# Patient Record
Sex: Male | Born: 1950 | Race: White | Hispanic: No | Smoking: Former smoker
Health system: Southern US, Community
[De-identification: ages and names within clinical notes are randomized; demographics above are authoritative.]

## PROBLEM LIST (undated history)

## (undated) DIAGNOSIS — E871 Hypo-osmolality and hyponatremia: Secondary | ICD-10-CM

## (undated) DIAGNOSIS — I10 Essential (primary) hypertension: Secondary | ICD-10-CM

## (undated) DIAGNOSIS — IMO0002 Reserved for concepts with insufficient information to code with codable children: Secondary | ICD-10-CM

## (undated) DIAGNOSIS — F1020 Alcohol dependence, uncomplicated: Secondary | ICD-10-CM

## (undated) DIAGNOSIS — IMO0001 Reserved for inherently not codable concepts without codable children: Secondary | ICD-10-CM

## (undated) DIAGNOSIS — I739 Peripheral vascular disease, unspecified: Secondary | ICD-10-CM

## (undated) DIAGNOSIS — E43 Unspecified severe protein-calorie malnutrition: Secondary | ICD-10-CM

## (undated) DIAGNOSIS — I2510 Atherosclerotic heart disease of native coronary artery without angina pectoris: Secondary | ICD-10-CM

## (undated) DIAGNOSIS — R011 Cardiac murmur, unspecified: Secondary | ICD-10-CM

## (undated) DIAGNOSIS — N492 Inflammatory disorders of scrotum: Secondary | ICD-10-CM

## (undated) DIAGNOSIS — C61 Malignant neoplasm of prostate: Secondary | ICD-10-CM

## (undated) DIAGNOSIS — L120 Bullous pemphigoid: Secondary | ICD-10-CM

## (undated) DIAGNOSIS — N179 Acute kidney failure, unspecified: Secondary | ICD-10-CM

## (undated) DIAGNOSIS — D696 Thrombocytopenia, unspecified: Secondary | ICD-10-CM

## (undated) HISTORY — DX: Thrombocytopenia, unspecified: D69.6

## (undated) HISTORY — DX: Hypo-osmolality and hyponatremia: E87.1

## (undated) HISTORY — DX: Peripheral vascular disease, unspecified: I73.9

## (undated) HISTORY — DX: Alcohol dependence, uncomplicated: F10.20

## (undated) HISTORY — PX: HEMORRHOID SURGERY: SHX153

## (undated) HISTORY — PX: WRIST SURGERY: SHX841

## (undated) HISTORY — DX: Bullous pemphigoid: L12.0

## (undated) HISTORY — DX: Acute kidney failure, unspecified: N17.9

## (undated) HISTORY — DX: Unspecified severe protein-calorie malnutrition: E43

## (undated) HISTORY — DX: Malignant neoplasm of prostate: C61

## (undated) HISTORY — DX: Reserved for inherently not codable concepts without codable children: IMO0001

## (undated) HISTORY — DX: Inflammatory disorders of scrotum: N49.2

## (undated) HISTORY — DX: Atherosclerotic heart disease of native coronary artery without angina pectoris: I25.10

## (undated) HISTORY — DX: Reserved for concepts with insufficient information to code with codable children: IMO0002

## (undated) HISTORY — PX: OTHER SURGICAL HISTORY: SHX169

## (undated) HISTORY — DX: Cardiac murmur, unspecified: R01.1

---

## 2001-02-06 ENCOUNTER — Emergency Department (HOSPITAL_COMMUNITY): Admission: EM | Admit: 2001-02-06 | Discharge: 2001-02-06 | Payer: Self-pay | Admitting: Emergency Medicine

## 2001-02-06 ENCOUNTER — Encounter: Payer: Self-pay | Admitting: Emergency Medicine

## 2009-05-03 ENCOUNTER — Encounter (INDEPENDENT_AMBULATORY_CARE_PROVIDER_SITE_OTHER): Payer: Self-pay | Admitting: Urology

## 2009-05-03 ENCOUNTER — Inpatient Hospital Stay (HOSPITAL_COMMUNITY): Admission: RE | Admit: 2009-05-03 | Discharge: 2009-05-04 | Payer: Self-pay | Admitting: Urology

## 2009-05-03 DIAGNOSIS — C61 Malignant neoplasm of prostate: Secondary | ICD-10-CM | POA: Insufficient documentation

## 2009-05-03 HISTORY — DX: Malignant neoplasm of prostate: C61

## 2009-05-03 HISTORY — PX: ROBOT ASSISTED LAPAROSCOPIC RADICAL PROSTATECTOMY: SHX5141

## 2010-09-04 LAB — BASIC METABOLIC PANEL
BUN: 4 mg/dL — ABNORMAL LOW (ref 6–23)
BUN: 4 mg/dL — ABNORMAL LOW (ref 6–23)
CO2: 30 mEq/L (ref 19–32)
Calcium: 8.1 mg/dL — ABNORMAL LOW (ref 8.4–10.5)
Calcium: 8.7 mg/dL (ref 8.4–10.5)
Chloride: 99 mEq/L (ref 96–112)
Creatinine, Ser: 0.73 mg/dL (ref 0.4–1.5)
Creatinine, Ser: 0.77 mg/dL (ref 0.4–1.5)
GFR calc Af Amer: >60 mL/min (ref >60)
GFR calc non Af Amer: >60 mL/min (ref >60)
GFR calc non Af Amer: >60 mL/min (ref >60)
Glucose, Bld: 102 mg/dL — ABNORMAL HIGH (ref 70–99)
Glucose, Bld: 136 mg/dL — ABNORMAL HIGH (ref 70–99)
Potassium: 4.7 mEq/L (ref 3.5–5.1)
Sodium: 132 mEq/L — ABNORMAL LOW (ref 135–145)

## 2010-09-04 LAB — CBC
Hemoglobin: 13.2 g/dL (ref 13.0–17.0)
MCHC: 34.4 g/dL (ref 30.0–36.0)
MCV: 97.4 fL (ref 78.0–100.0)
Platelets: 245 10*3/uL (ref 150–400)
RBC: 3.94 MIL/uL — ABNORMAL LOW (ref 4.22–5.81)
RDW: 12.6 % (ref 11.5–15.5)

## 2010-09-04 LAB — DIFFERENTIAL
Basophils Absolute: 0 10*3/uL (ref 0.0–0.1)
Basophils Relative: 0 % (ref 0–1)
Eosinophils Absolute: 0 10*3/uL (ref 0.0–0.7)
Eosinophils Relative: 1 % (ref 0–5)
Lymphocytes Relative: 17 % (ref 12–46)
Monocytes Absolute: 0.2 10*3/uL (ref 0.1–1.0)
Monocytes Relative: 2 % — ABNORMAL LOW (ref 3–12)
Neutro Abs: 5.2 10*3/uL (ref 1.7–7.7)
Neutrophils Relative %: 91 % — ABNORMAL HIGH (ref 43–77)

## 2010-09-04 LAB — POTASSIUM: Potassium: 5.4 mEq/L — ABNORMAL HIGH (ref 3.5–5.1)

## 2010-09-05 LAB — CBC
MCV: 98.2 fL (ref 78.0–100.0)
Platelets: 322 10*3/uL (ref 150–400)
WBC: 6.4 10*3/uL (ref 4.0–10.5)

## 2010-09-05 LAB — COMPREHENSIVE METABOLIC PANEL
AST: 26 U/L (ref 0–37)
Albumin: 4.4 g/dL (ref 3.5–5.2)
Chloride: 97 mEq/L (ref 96–112)
Creatinine, Ser: 0.92 mg/dL (ref 0.4–1.5)
GFR calc Af Amer: >60 mL/min (ref >60)
Total Bilirubin: 0.7 mg/dL (ref 0.3–1.2)
Total Protein: 7.4 g/dL (ref 6.0–8.3)

## 2010-09-05 LAB — TYPE AND SCREEN: ABO/RH(D): A POS

## 2011-06-07 ENCOUNTER — Encounter: Payer: Self-pay | Admitting: *Deleted

## 2011-06-07 NOTE — Progress Notes (Signed)
Psa 05/09/11= 0.12 Psa 01/31/11=0.07 Psa 09/25/10=0.03 Psa 05/24/10=0.01  Path:05/03/2009: Prostate GNF:[id number],EXBMWUX=3(2+4) involving both lobes,(9/12) cores positive ,Radical Prostatectomy Dx 03/09/09, Psa=4.4,Volume=15cc Psa /2011=<0.04   Married,Meter Reader, no children  Allergies:Nkda

## 2011-06-10 ENCOUNTER — Ambulatory Visit
Admission: RE | Admit: 2011-06-10 | Discharge: 2011-06-10 | Disposition: A | Discharge status: Home / Self Care | Payer: BC Managed Care – PPO | Source: Ambulatory Visit | Attending: Radiation Oncology | Admitting: Radiation Oncology

## 2011-06-10 ENCOUNTER — Encounter: Payer: Self-pay | Admitting: Radiation Oncology

## 2011-06-10 VITALS — BP 159/90 | HR 105 | Temp 97.8°F | Resp 20 | Ht 67.0 in | Wt 131.4 lb

## 2011-06-10 DIAGNOSIS — F17200 Nicotine dependence, unspecified, uncomplicated: Secondary | ICD-10-CM | POA: Insufficient documentation

## 2011-06-10 DIAGNOSIS — C61 Malignant neoplasm of prostate: Secondary | ICD-10-CM

## 2011-06-10 DIAGNOSIS — Z9079 Acquired absence of other genital organ(s): Secondary | ICD-10-CM | POA: Insufficient documentation

## 2011-06-10 DIAGNOSIS — Z510 Encounter for antineoplastic radiation therapy: Secondary | ICD-10-CM | POA: Insufficient documentation

## 2011-06-10 DIAGNOSIS — Z8049 Family history of malignant neoplasm of other genital organs: Secondary | ICD-10-CM | POA: Insufficient documentation

## 2011-06-10 DIAGNOSIS — R32 Unspecified urinary incontinence: Secondary | ICD-10-CM | POA: Insufficient documentation

## 2011-06-10 DIAGNOSIS — Z803 Family history of malignant neoplasm of breast: Secondary | ICD-10-CM | POA: Insufficient documentation

## 2011-06-10 DIAGNOSIS — K117 Disturbances of salivary secretion: Secondary | ICD-10-CM | POA: Insufficient documentation

## 2011-06-10 DIAGNOSIS — N390 Urinary tract infection, site not specified: Secondary | ICD-10-CM | POA: Insufficient documentation

## 2011-06-10 DIAGNOSIS — R3915 Urgency of urination: Secondary | ICD-10-CM | POA: Insufficient documentation

## 2011-06-10 DIAGNOSIS — R110 Nausea: Secondary | ICD-10-CM | POA: Insufficient documentation

## 2011-06-10 DIAGNOSIS — T50995A Adverse effect of other drugs, medicaments and biological substances, initial encounter: Secondary | ICD-10-CM | POA: Insufficient documentation

## 2011-06-10 NOTE — Progress Notes (Signed)
Pt denys any dysuria, hesitancy, gets up at night 1-2x to void, does have urgency though

## 2011-06-10 NOTE — Progress Notes (Signed)
Radiation Oncology         [phone]) [phone] ________________________________  Initial outpatient Consultation  Name: Rodney [name] MRN: [id number]  Date: 06/10/2011  DOB: 09-14-1950  CC:  Valetta Fuller, MD   REFERRING PHYSICIAN: Valetta Fuller, MD  DIAGNOSIS: 61 year old gentleman with a detectable PSA of 0.12 following radical prostatectomy for stage T2c disease with Gleason's 3+4  HISTORY OF PRESENT ILLNESS::Rodney [name] is a 62 y.o. man who was noted at age 7 to have a slightly elevated PSA of 4.4. This prompted a transrectal ultrasound with prostate biopsies on 03/19/2009. At that time, 9/12 core biopsies were positive for adenocarcinoma with a maximum Gleason score of 3+4. Rodney [name] elected to proceed with robotic-assisted laparoscopic radical prostatectomy on 05/04/1999 and. Rodney surgical specimen contained adenocarcinoma with a Gleason score of 3+4 involving both lobes. Rodney surgical margins were negative. 6 right-sided lymph nodes and 4 left-sided lymph nodes were free of metastatic involvement. Rodney specimen was notable for no extracapsular extension and Rodney seminal vesicles were free of involvement. Rodney pathology report did described apical involvement. Postoperatively, Mr. alleys PSAs have remained undetectable until August 30 when Rodney PSA was slightly higher at 0.07. Followup PSA on 05/09/2011 was higher at 0.12. Rodney [name] has kindly referred today for discussion of possible salvage radiotherapy in Rodney setting of a possible rising PSA.  PREVIOUS RADIATION THERAPY: No  PAST MEDICAL HISTORY:  has a past medical history of Prostate cancer (05/03/2009); Heart murmur; and Cataract.    PAST SURGICAL HISTORY: Past Surgical History  Procedure Date  . Left eye surgery   . Hemorrhoid surgery   . Robot assisted laparoscopic radical prostatectomy 05/03/2009  . Wrist surgery     left    FAMILY HISTORY: family history includes Breast cancer in his sister and Uterine cancer (age of  onset:85) in his mother.  SOCIAL HISTORY:  reports that he has been smoking Cigarettes.  He has a 72 pack-year smoking history. He does not have any smokeless tobacco history on file. He reports that he drinks about 3.6 ounces of alcohol per week. He reports that he does not use illicit drugs.  ALLERGIES: Other  MEDICATIONS:  Current Outpatient Prescriptions  Medication Sig Dispense Refill  . doxycycline (VIBRA-TABS) 100 MG tablet Take 100 mg by mouth 2 (two) times daily.          REVIEW OF SYSTEMS:  A 15 point review of systems is documented in Rodney medical record. This is essentially noncontributory 3. Rodney [name] did fill out I PSS questionnaire today reporting overall score of 6 suggesting mild obstructive symptoms. He also: IIA if questionnaire indicating that he is not sexually active. He has regained control of Rodney latter.(bladder)   PHYSICAL EXAM:  height is 5\' 7"  (1.702 m) and weight is 131 lb 6.4 oz (59.603 kg). His oral temperature is 97.8 F (36.6 C). His blood pressure is 159/90 and his pulse is 105. His respiration is 20.   Rodney [name] is in no acute distress today. He is alert and oriented.  LABORATORY DATA:  Lab Results  Component Value Date   WBC 7.4 05/04/2009   HGB 12.0* 05/04/2009   HCT 34.3* 05/04/2009   MCV 98.6 05/04/2009   PLT 245 05/04/2009   Lab Results  Component Value Date   NA 129* 05/04/2009   K 3.9 DELTA CHECK NOTED 05/04/2009   CL 100 05/04/2009   CO2 25 05/04/2009   Lab Results  Component Value Date   ALT  23 05/02/2009   AST 26 05/02/2009   ALKPHOS 103 05/02/2009   BILITOT 0.7 05/02/2009        IMPRESSION: Rodney [name] is a very nice 61 year old gentleman with stage TII C. adenocarcinoma prostate with a Gleason score 3+4. His pre-prostatectomy PSA was 4.4 and postoperatively was undetectable until recently. His most recent PSA level has reached a maximum level of 0.12. Rodney [name] may benefit from salvage radiotherapy to Rodney prostatic fossa. He does not  have pathologic adverse features in his surgical specimen such as positive margins, extracapsular extension or seminal vesicle involvement.  PLAN: Today, I talked with Rodney [name] and his wife about Rodney PSA readings following surgery. Talked about Rodney role of radiation treatment following prostatectomy. We discussed Rodney logistics and delivery of salvage radiotherapy as well as Rodney anticipated acute and late sequelae. We discussed how Tomotherapy can be used to shape radiation around Rodney prostatic fossa fossa safely to provide treatment with low risk for bladder and rectal toxicity. We reviewed Rodney potential local recurrence of prostate cancer versus micrometastatic disease and that there is no diagnostic test that can help determine Rodney source of Rodney [name]'s detectable and rising PSA. Rodney [name] understands Rodney rationale for prostatic fossa radiotherapy in order to treat Rodney statistically most likely location for recurrent disease in Rodney hope that this provides curative salvage. We spent more than 50% our one hour visit today in [name] counseling. I filled out a [name] counseling form for Rodney [name] with relevant treatments diagrams and we retained a copy for our records. Rodney [name] is interested in proceeding with salvage prosthetic fossa radiotherapy and he will return to Rodney radiation oncology clinic on January 18 at 3 PM in order to proceed. I anticipate delivering 68.4 gray to Rodney prostatic fossa using intensity modulated radiotherapy with daily image guidance of Rodney prostatic fossa is using megavoltage CTs on Rodney TomoTherapy unit   ------------------------------------------------  Artist Pais. Kathrynn Running, M.D.

## 2011-06-10 NOTE — Progress Notes (Signed)
Please see the Nurse Progress Note in the MD Initial Consult Encounter for this patient. 

## 2011-06-21 ENCOUNTER — Ambulatory Visit
Admission: RE | Admit: 2011-06-21 | Discharge: 2011-06-21 | Disposition: A | Discharge status: Home / Self Care | Payer: BC Managed Care – PPO | Source: Ambulatory Visit | Attending: Radiation Oncology | Admitting: Radiation Oncology

## 2011-06-21 ENCOUNTER — Ambulatory Visit: Admission: RE | Admit: 2011-06-21 | Payer: BC Managed Care – PPO | Source: Ambulatory Visit

## 2011-06-21 DIAGNOSIS — C61 Malignant neoplasm of prostate: Secondary | ICD-10-CM

## 2011-06-21 NOTE — Progress Notes (Signed)
  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  Identity was confirmed.  All relevant records and images related to the planned course of therapy were reviewed.  The patient freely provided informed written consent to proceed with treatment after reviewing the details related to the planned course of therapy. The consent form was witnessed and verified by the simulation staff.  Then, the patient was set-up in a stable reproducible  supine position for radiation therapy.  CT images were obtained.  Surface markings were placed.  The CT images were loaded into the planning software.  Then the target and avoidance structures were contoured.  Treatment planning then occurred.  The radiation prescription was entered and confirmed.  A total of 1 complex treatment device was fabricated. This was an alpha cradle for precise leg position a daily basis. I have requested : Intensity Modulated Radiotherapy (IMRT) is medically necessary for this case for the following reason:  Rectal sparing.  PLAN:  The patient will receive 68.4 Gy in 38 fraction.  ________________________________  Artist Pais Kathrynn Running, M.D.

## 2011-07-02 ENCOUNTER — Ambulatory Visit
Admission: RE | Admit: 2011-07-02 | Discharge: 2011-07-02 | Disposition: A | Discharge status: Home / Self Care | Payer: BC Managed Care – PPO | Source: Ambulatory Visit | Attending: Radiation Oncology | Admitting: Radiation Oncology

## 2011-07-03 ENCOUNTER — Ambulatory Visit
Admission: RE | Admit: 2011-07-03 | Discharge: 2011-07-03 | Disposition: A | Discharge status: Home / Self Care | Payer: BC Managed Care – PPO | Source: Ambulatory Visit | Attending: Radiation Oncology | Admitting: Radiation Oncology

## 2011-07-04 ENCOUNTER — Ambulatory Visit
Admission: RE | Admit: 2011-07-04 | Discharge: 2011-07-04 | Disposition: A | Discharge status: Home / Self Care | Payer: BC Managed Care – PPO | Source: Ambulatory Visit | Attending: Radiation Oncology | Admitting: Radiation Oncology

## 2011-07-05 ENCOUNTER — Encounter: Payer: Self-pay | Admitting: Radiation Oncology

## 2011-07-05 ENCOUNTER — Ambulatory Visit
Admission: RE | Admit: 2011-07-05 | Discharge: 2011-07-05 | Disposition: A | Discharge status: Home / Self Care | Payer: BC Managed Care – PPO | Source: Ambulatory Visit | Attending: Radiation Oncology | Admitting: Radiation Oncology

## 2011-07-05 VITALS — Wt 132.4 lb

## 2011-07-05 DIAGNOSIS — C61 Malignant neoplasm of prostate: Secondary | ICD-10-CM

## 2011-07-05 NOTE — Progress Notes (Signed)
Post simmed, reviewed, radiation therapy and you, s/s, s/e to report, will see MD weekly/prn, can call for any questions, no c/o dysuria , drinking plenty water, having regular bowel movements 4:27 PM

## 2011-07-05 NOTE — Progress Notes (Signed)
  Radiation Oncology         [phone] ________________________________  Name: Rodney [name] MRN: [id number]  Date: 07/05/2011  DOB: 03-25-1951  Weekly Radiation Therapy Management  Current Dose: 7.2 Gy     Planned Dose:  68.4 Gy  Narrative . . . . . . . . The patient presents for routine under treatment assessment.                                                      The patient is without complaint.                                 Set-up films were reviewed.                                 The chart was checked. Physical Findings. . . Weight essentially stable.  No significant changes. Impression . . . . . . . The patient is  tolerating radiation. Plan . . . . . . . . . . . . Continue treatment as planned.  ________________________________  Artist Pais. Kathrynn Running, M.D.

## 2011-07-08 ENCOUNTER — Ambulatory Visit
Admission: RE | Admit: 2011-07-08 | Discharge: 2011-07-08 | Disposition: A | Discharge status: Home / Self Care | Payer: BC Managed Care – PPO | Source: Ambulatory Visit | Attending: Radiation Oncology | Admitting: Radiation Oncology

## 2011-07-09 ENCOUNTER — Ambulatory Visit
Admission: RE | Admit: 2011-07-09 | Discharge: 2011-07-09 | Disposition: A | Discharge status: Home / Self Care | Payer: BC Managed Care – PPO | Source: Ambulatory Visit | Attending: Radiation Oncology | Admitting: Radiation Oncology

## 2011-07-10 ENCOUNTER — Ambulatory Visit
Admission: RE | Admit: 2011-07-10 | Discharge: 2011-07-10 | Disposition: A | Discharge status: Home / Self Care | Payer: BC Managed Care – PPO | Source: Ambulatory Visit | Attending: Radiation Oncology | Admitting: Radiation Oncology

## 2011-07-11 ENCOUNTER — Ambulatory Visit
Admission: RE | Admit: 2011-07-11 | Discharge: 2011-07-11 | Disposition: A | Discharge status: Home / Self Care | Payer: BC Managed Care – PPO | Source: Ambulatory Visit | Attending: Radiation Oncology | Admitting: Radiation Oncology

## 2011-07-12 ENCOUNTER — Ambulatory Visit
Admission: RE | Admit: 2011-07-12 | Discharge: 2011-07-12 | Disposition: A | Discharge status: Home / Self Care | Payer: BC Managed Care – PPO | Source: Ambulatory Visit | Attending: Radiation Oncology | Admitting: Radiation Oncology

## 2011-07-12 ENCOUNTER — Encounter: Payer: Self-pay | Admitting: Radiation Oncology

## 2011-07-12 VITALS — Wt 129.5 lb

## 2011-07-12 DIAGNOSIS — C61 Malignant neoplasm of prostate: Secondary | ICD-10-CM

## 2011-07-12 NOTE — Progress Notes (Signed)
  Radiation Oncology         [phone] ________________________________  Name: Rodney [name] MRN: [id number]  Date: 07/12/2011  DOB: 10-16-1950  Weekly Radiation Therapy Management  Current Dose: 16.2 Gy     Planned Dose:  68.4 Gy  Narrative . . . . . . . . The patient presents for routine under treatment assessment.                                                      The patient is without complaint.                                 Set-up films were reviewed.                                 The chart was checked. Physical Findings. . . Weight essentially stable.  No significant changes. Impression . . . . . . . The patient is  tolerating radiation. Plan . . . . . . . . . . . . Continue treatment as planned.  ________________________________  Artist Pais. Kathrynn Running, M.D.

## 2011-07-12 NOTE — Progress Notes (Signed)
Pt not taking any doxycycline any more stated pt, no c/o , no dysuria, or discomfort, eating and drinking well 4:25 PM

## 2011-07-15 ENCOUNTER — Ambulatory Visit
Admission: RE | Admit: 2011-07-15 | Discharge: 2011-07-15 | Disposition: A | Discharge status: Home / Self Care | Payer: BC Managed Care – PPO | Source: Ambulatory Visit | Attending: Radiation Oncology | Admitting: Radiation Oncology

## 2011-07-16 ENCOUNTER — Ambulatory Visit
Admission: RE | Admit: 2011-07-16 | Discharge: 2011-07-16 | Disposition: A | Discharge status: Home / Self Care | Payer: BC Managed Care – PPO | Source: Ambulatory Visit | Attending: Radiation Oncology | Admitting: Radiation Oncology

## 2011-07-17 ENCOUNTER — Ambulatory Visit
Admission: RE | Admit: 2011-07-17 | Discharge: 2011-07-17 | Disposition: A | Discharge status: Home / Self Care | Payer: BC Managed Care – PPO | Source: Ambulatory Visit | Attending: Radiation Oncology | Admitting: Radiation Oncology

## 2011-07-18 ENCOUNTER — Ambulatory Visit
Admission: RE | Admit: 2011-07-18 | Discharge: 2011-07-18 | Disposition: A | Discharge status: Home / Self Care | Payer: BC Managed Care – PPO | Source: Ambulatory Visit | Attending: Radiation Oncology | Admitting: Radiation Oncology

## 2011-07-18 ENCOUNTER — Encounter: Payer: Self-pay | Admitting: Radiation Oncology

## 2011-07-18 VITALS — Wt 129.2 lb

## 2011-07-18 DIAGNOSIS — C61 Malignant neoplasm of prostate: Secondary | ICD-10-CM

## 2011-07-18 NOTE — Progress Notes (Signed)
  Radiation Oncology         [phone] ________________________________  Name: Rodney [name]             MRN: [id number]  Date: 07/18/2011  DOB: 12/24/1950  Weekly Radiation Therapy Management  Current Dose: 23.4 Gy     Planned Dose:  68.4 Gy  Narrative . . . . . . . . The patient presents for routine under treatment assessment.                                                      The patient is without complaint.                                 Set-up films were reviewed.                                 The chart was checked. Physical Findings. . . Weight essentially stable.  No significant changes. Impression . . . . . . . The patient is  tolerating radiation. Plan . . . . . . . . . . . . Continue treatment as planned.  ________________________________  Artist Pais. Kathrynn Running, M.D.

## 2011-07-18 NOTE — Progress Notes (Signed)
No c/o today 

## 2011-07-19 ENCOUNTER — Ambulatory Visit
Admission: RE | Admit: 2011-07-19 | Discharge: 2011-07-19 | Disposition: A | Discharge status: Home / Self Care | Payer: BC Managed Care – PPO | Source: Ambulatory Visit | Attending: Radiation Oncology | Admitting: Radiation Oncology

## 2011-07-22 ENCOUNTER — Ambulatory Visit
Admission: RE | Admit: 2011-07-22 | Discharge: 2011-07-22 | Disposition: A | Discharge status: Home / Self Care | Payer: BC Managed Care – PPO | Source: Ambulatory Visit | Attending: Radiation Oncology | Admitting: Radiation Oncology

## 2011-07-23 ENCOUNTER — Ambulatory Visit
Admission: RE | Admit: 2011-07-23 | Discharge: 2011-07-23 | Disposition: A | Discharge status: Home / Self Care | Payer: BC Managed Care – PPO | Source: Ambulatory Visit | Attending: Radiation Oncology | Admitting: Radiation Oncology

## 2011-07-24 ENCOUNTER — Ambulatory Visit
Admission: RE | Admit: 2011-07-24 | Discharge: 2011-07-24 | Disposition: A | Discharge status: Home / Self Care | Payer: BC Managed Care – PPO | Source: Ambulatory Visit | Attending: Radiation Oncology | Admitting: Radiation Oncology

## 2011-07-25 ENCOUNTER — Ambulatory Visit
Admission: RE | Admit: 2011-07-25 | Discharge: 2011-07-25 | Disposition: A | Discharge status: Home / Self Care | Payer: BC Managed Care – PPO | Source: Ambulatory Visit | Attending: Radiation Oncology | Admitting: Radiation Oncology

## 2011-07-25 ENCOUNTER — Encounter: Payer: Self-pay | Admitting: Radiation Oncology

## 2011-07-25 VITALS — BP 132/80 | HR 83 | Resp 18 | Wt 130.6 lb

## 2011-07-25 DIAGNOSIS — C61 Malignant neoplasm of prostate: Secondary | ICD-10-CM

## 2011-07-25 NOTE — Progress Notes (Signed)
Patient presents to the clinic today unaccompanied for under treat visit with Dr. Mitzi Hansen. Patient is alert and oriented to person, place, and time. No distress noted. Steady gait noted. Pleasant affect noted. Patient denies pain at this time. Patient denies hematuria or burning upon urination. Patient reports that "every ten minutes or so my bladder screams at me that I have to go." Patient reports he gets up 3 times per night on average to void. Reported all findings to Dr. Mitzi Hansen.

## 2011-07-26 ENCOUNTER — Ambulatory Visit
Admission: RE | Admit: 2011-07-26 | Discharge: 2011-07-26 | Disposition: A | Discharge status: Home / Self Care | Payer: BC Managed Care – PPO | Source: Ambulatory Visit | Attending: Radiation Oncology | Admitting: Radiation Oncology

## 2011-07-27 NOTE — Progress Notes (Signed)
[facility] Radiation Oncology Weekly Treatment Note    Name: TYTON ABDALLAH Date: 07/27/2011 MRN: [id number] DOB: 1951-06-01  Status: outpatient    Current dose: 3240  Current fraction:18  Planned dose:6840  Planned fraction:38   MEDICATIONS: No current outpatient prescriptions on file.     ALLERGIES: Other   LABORATORY DATA:  Lab Results  Component Value Date   WBC 7.4 05/04/2009   HGB 12.0* 05/04/2009   HCT 34.3* 05/04/2009   MCV 98.6 05/04/2009   PLT 245 05/04/2009   Lab Results  Component Value Date   NA 129* 05/04/2009   K 3.9 DELTA CHECK NOTED 05/04/2009   CL 100 05/04/2009   CO2 25 05/04/2009   Lab Results  Component Value Date   ALT 23 05/02/2009   AST 26 05/02/2009   ALKPHOS 103 05/02/2009   BILITOT 0.7 05/02/2009      NARRATIVE: DASON MOSLEY was seen today for weekly treatment management. The chart was checked and MVCT images were reviewed. Pt doing well overall. No dysuria/ hematuria. Increased frequency.  PHYSICAL EXAMINATION: weight is 130 lb 9.6 oz (59.24 kg). His blood pressure is 132/80 and his pulse is 83. His respiration is 18.       ASSESSMENT: Patient tolerating treatments fairly well - some irritative symptoms.   PLAN: Continue treatment as planned. Discussed possibility of meds for symptoms - pt wishes to hold off for now.

## 2011-07-29 ENCOUNTER — Ambulatory Visit
Admission: RE | Admit: 2011-07-29 | Discharge: 2011-07-29 | Disposition: A | Discharge status: Home / Self Care | Payer: BC Managed Care – PPO | Source: Ambulatory Visit | Attending: Radiation Oncology | Admitting: Radiation Oncology

## 2011-07-30 ENCOUNTER — Ambulatory Visit
Admission: RE | Admit: 2011-07-30 | Discharge: 2011-07-30 | Disposition: A | Discharge status: Home / Self Care | Payer: BC Managed Care – PPO | Source: Ambulatory Visit | Attending: Radiation Oncology | Admitting: Radiation Oncology

## 2011-07-31 ENCOUNTER — Ambulatory Visit
Admission: RE | Admit: 2011-07-31 | Discharge: 2011-07-31 | Disposition: A | Discharge status: Home / Self Care | Payer: BC Managed Care – PPO | Source: Ambulatory Visit | Attending: Radiation Oncology | Admitting: Radiation Oncology

## 2011-08-01 ENCOUNTER — Ambulatory Visit
Admission: RE | Admit: 2011-08-01 | Discharge: 2011-08-01 | Disposition: A | Discharge status: Home / Self Care | Payer: BC Managed Care – PPO | Source: Ambulatory Visit | Attending: Radiation Oncology | Admitting: Radiation Oncology

## 2011-08-02 ENCOUNTER — Ambulatory Visit
Admission: RE | Admit: 2011-08-02 | Discharge: 2011-08-02 | Disposition: A | Discharge status: Home / Self Care | Payer: BC Managed Care – PPO | Source: Ambulatory Visit | Attending: Radiation Oncology | Admitting: Radiation Oncology

## 2011-08-02 ENCOUNTER — Encounter: Payer: Self-pay | Admitting: Radiation Oncology

## 2011-08-02 DIAGNOSIS — N3289 Other specified disorders of bladder: Secondary | ICD-10-CM

## 2011-08-02 MED ORDER — SOLIFENACIN SUCCINATE 5 MG PO TABS: 5.0000 mg | ORAL_TABLET | Freq: Every day | ORAL | Status: DC

## 2011-08-02 NOTE — Progress Notes (Signed)
  Radiation Oncology         [phone] ________________________________  Name: Rodney [name] MRN: [id number]  Date: 08/02/2011  DOB: 1950-11-05  Weekly Radiation Therapy Management  Current Dose: 43.2 Gy     Planned Dose:  68.4 Gy  Narrative . . . . . . . . The patient presents for routine under treatment assessment.                                              The patient is noting sudden episodes of urinary urgency .                                 Set-up films were reviewed.                                 The chart was checked. Physical Findings. . . Weight essentially stable.  No significant changes. Impression . . . . . . . The patient is  tolerating radiation. Plan . . . . . . . . . . . . Continue treatment as planned.  Given Vesicare 5 mg daily.  ________________________________  Artist Pais Kathrynn Running, M.D.

## 2011-08-02 NOTE — Progress Notes (Signed)
NO C/O DYSURIA OR DIARRHEA BUT HE STATES THAT HIS BLADDER CONTROL IS "SHOT".  HAVING TROUBLE WITH INCONTINENCE OF URINE.  DOES SAY THAT THERE IS A LITTLE DISCOMFORT WITH URINATION BUT NOTHING HE CAN'T TOLERATE

## 2011-08-05 ENCOUNTER — Other Ambulatory Visit: Payer: Self-pay | Admitting: Radiation Oncology

## 2011-08-05 ENCOUNTER — Ambulatory Visit
Admission: RE | Admit: 2011-08-05 | Discharge: 2011-08-05 | Disposition: A | Discharge status: Home / Self Care | Payer: BC Managed Care – PPO | Source: Ambulatory Visit | Attending: Radiation Oncology | Admitting: Radiation Oncology

## 2011-08-05 DIAGNOSIS — R32 Unspecified urinary incontinence: Secondary | ICD-10-CM

## 2011-08-05 DIAGNOSIS — C61 Malignant neoplasm of prostate: Secondary | ICD-10-CM

## 2011-08-05 DIAGNOSIS — R300 Dysuria: Secondary | ICD-10-CM

## 2011-08-06 ENCOUNTER — Telehealth: Payer: Self-pay | Admitting: *Deleted

## 2011-08-06 ENCOUNTER — Ambulatory Visit
Admission: RE | Admit: 2011-08-06 | Discharge: 2011-08-06 | Disposition: A | Discharge status: Home / Self Care | Payer: BC Managed Care – PPO | Source: Ambulatory Visit | Attending: Radiation Oncology | Admitting: Radiation Oncology

## 2011-08-06 NOTE — Telephone Encounter (Signed)
Pt called, asked to come in early for urinalysis and culture before treatment at 330pm,but patient stated he gets off work at 330pm everyday, will get sample when patient gets here and will send to hospital lab after 11:53 AM

## 2011-08-07 ENCOUNTER — Ambulatory Visit
Admission: RE | Admit: 2011-08-07 | Discharge: 2011-08-07 | Disposition: A | Discharge status: Home / Self Care | Payer: BC Managed Care – PPO | Source: Ambulatory Visit | Attending: Radiation Oncology | Admitting: Radiation Oncology

## 2011-08-08 ENCOUNTER — Ambulatory Visit
Admission: RE | Admit: 2011-08-08 | Discharge: 2011-08-08 | Disposition: A | Discharge status: Home / Self Care | Payer: BC Managed Care – PPO | Source: Ambulatory Visit | Attending: Radiation Oncology | Admitting: Radiation Oncology

## 2011-08-09 ENCOUNTER — Ambulatory Visit
Admission: RE | Admit: 2011-08-09 | Discharge: 2011-08-09 | Disposition: A | Discharge status: Home / Self Care | Payer: BC Managed Care – PPO | Source: Ambulatory Visit | Attending: Radiation Oncology | Admitting: Radiation Oncology

## 2011-08-09 VITALS — Wt 130.0 lb

## 2011-08-09 DIAGNOSIS — C61 Malignant neoplasm of prostate: Secondary | ICD-10-CM

## 2011-08-09 DIAGNOSIS — R300 Dysuria: Secondary | ICD-10-CM

## 2011-08-09 NOTE — Progress Notes (Signed)
SOME BURNING WITH URINATION, GAVE A SPECIMIN FOR C&S AND UA TODAY.  NO OTHER C/O OF AT THIS TIME

## 2011-08-11 ENCOUNTER — Encounter: Payer: Self-pay | Admitting: Radiation Oncology

## 2011-08-11 NOTE — Progress Notes (Signed)
  Radiation Oncology         [phone] ________________________________  Name: Rodney [name]  MRN: [id number]  Date: 08/09/2011  DOB: 06/10/1950  Weekly Radiation Therapy Management  Current Dose: 52.2 Gy     Planned Dose:  68.4 Gy  Narrative . . . . . . . . The patient presents for routine under treatment assessment.                                          He has been struggling with recurring urinary incontinence. He has gone from one pad once daily up to wearing full-size diapers several times daily for bouts of incontinence.                      He denies dysuria he denies any rectal symptoms.                                 Set-up films were reviewed.                                 The chart was checked. Physical Findings. . . Weight essentially stable.  No significant changes. Impression . . . . . . . The patient is  tolerating radiation. Plan . . . . . . . . . . . . Continue treatment as planned. Hopefully, his urinary incontinence will resolve following completion radiation. To rule out urinary tract infection, we'll obtain a urinalysis and culture today.  ________________________________  Artist Pais. Kathrynn Running, M.D.

## 2011-08-12 ENCOUNTER — Other Ambulatory Visit: Payer: Self-pay | Admitting: Radiation Oncology

## 2011-08-12 ENCOUNTER — Ambulatory Visit
Admission: RE | Admit: 2011-08-12 | Discharge: 2011-08-12 | Disposition: A | Discharge status: Home / Self Care | Payer: BC Managed Care – PPO | Source: Ambulatory Visit | Attending: Radiation Oncology | Admitting: Radiation Oncology

## 2011-08-12 DIAGNOSIS — R300 Dysuria: Secondary | ICD-10-CM

## 2011-08-12 MED ORDER — CIPROFLOXACIN HCL 500 MG PO TABS: 500.0000 mg | ORAL_TABLET | Freq: Two times a day (BID) | ORAL | Status: AC

## 2011-08-12 NOTE — Progress Notes (Signed)
  Radiation Oncology         [phone] ________________________________  Name: Rodney [name] MRN: [id number]  Date: 08/12/2011  DOB: 12/28/1950  Chart Note  Rodney [name] has been complaining of increasing challenges with urinary incontinence. We ordered a urinalysis and culture to be performed. The urinalysis shows nothing suspicious. However, the urine culture grew greater than 100,000 colonies of Escherichia coli which appears to be sensitive to ciprofloxacin. Based on this result, I entered the prescription for 500 mg of Cipro to be delivered twice daily for 10 days. This was electronically transmitted to the patient's pharmacy, [facility] on [address].  ________________________________  Artist Pais. Kathrynn Running, M.D.

## 2011-08-13 ENCOUNTER — Ambulatory Visit
Admission: RE | Admit: 2011-08-13 | Discharge: 2011-08-13 | Disposition: A | Discharge status: Home / Self Care | Payer: BC Managed Care – PPO | Source: Ambulatory Visit | Attending: Radiation Oncology | Admitting: Radiation Oncology

## 2011-08-13 ENCOUNTER — Telehealth: Payer: Self-pay | Admitting: *Deleted

## 2011-08-13 ENCOUNTER — Encounter: Payer: Self-pay | Admitting: *Deleted

## 2011-08-13 NOTE — Telephone Encounter (Signed)
Called [facility] to check on Rx for Cipro if it  has been received  electronically yet, spoke with pharmacy tech, "yes it is her",thanked her and wioll call patient, called patient, spoke with Mr. gaynor, ferreras can pick up rx for cipro any time, it is ready, that was called in last evening, at [facility] [address],pt thanked staff for the call and Dr. Kathrynn Running 8:15 AM

## 2011-08-14 ENCOUNTER — Ambulatory Visit
Admission: RE | Admit: 2011-08-14 | Discharge: 2011-08-14 | Disposition: A | Discharge status: Home / Self Care | Payer: BC Managed Care – PPO | Source: Ambulatory Visit | Attending: Radiation Oncology | Admitting: Radiation Oncology

## 2011-08-15 ENCOUNTER — Ambulatory Visit
Admission: RE | Admit: 2011-08-15 | Discharge: 2011-08-15 | Disposition: A | Discharge status: Home / Self Care | Payer: BC Managed Care – PPO | Source: Ambulatory Visit | Attending: Radiation Oncology | Admitting: Radiation Oncology

## 2011-08-16 ENCOUNTER — Ambulatory Visit
Admission: RE | Admit: 2011-08-16 | Discharge: 2011-08-16 | Disposition: A | Discharge status: Home / Self Care | Payer: BC Managed Care – PPO | Source: Ambulatory Visit | Attending: Radiation Oncology | Admitting: Radiation Oncology

## 2011-08-16 ENCOUNTER — Encounter: Payer: Self-pay | Admitting: Radiation Oncology

## 2011-08-16 VITALS — Wt 126.6 lb

## 2011-08-16 DIAGNOSIS — C61 Malignant neoplasm of prostate: Secondary | ICD-10-CM

## 2011-08-16 NOTE — Progress Notes (Signed)
  Radiation Oncology         [phone] ________________________________  Name: Rodney [name]  MRN: [id number]  Date: 08/16/2011  DOB: June 25, 1950  Weekly Radiation Therapy Management  Current Dose: 61.2 Gy     Planned Dose:  68.4 Gy  Narrative . . . . . . . . The patient presents for routine under treatment assessment.                                                      The patient is without complaint.  UTI symptoms better.                                 Set-up films were reviewed.                                 The chart was checked. Physical Findings. . . Weight essentially stable.  No significant changes. Impression . . . . . . . The patient is  tolerating radiation. Plan . . . . . . . . . . . . Continue treatment as planned.  ________________________________  Artist Pais. Kathrynn Running, M.D.

## 2011-08-16 NOTE — Progress Notes (Signed)
Pt steady gait alert and oriented x 3, cipro has helped al ot stated patient, no dysuria and frequecny of stream has slowed down, but still has urgency and "when it hits I gotta go then" No pain, bowel movements normal, 34/38 txs completed  4:46 PM

## 2011-08-19 ENCOUNTER — Ambulatory Visit
Admission: RE | Admit: 2011-08-19 | Discharge: 2011-08-19 | Disposition: A | Discharge status: Home / Self Care | Payer: BC Managed Care – PPO | Source: Ambulatory Visit | Attending: Radiation Oncology | Admitting: Radiation Oncology

## 2011-08-20 ENCOUNTER — Ambulatory Visit
Admission: RE | Admit: 2011-08-20 | Discharge: 2011-08-20 | Disposition: A | Discharge status: Home / Self Care | Payer: BC Managed Care – PPO | Source: Ambulatory Visit | Attending: Radiation Oncology | Admitting: Radiation Oncology

## 2011-08-21 ENCOUNTER — Ambulatory Visit
Admission: RE | Admit: 2011-08-21 | Discharge: 2011-08-21 | Disposition: A | Discharge status: Home / Self Care | Payer: BC Managed Care – PPO | Source: Ambulatory Visit | Attending: Radiation Oncology | Admitting: Radiation Oncology

## 2011-08-22 ENCOUNTER — Ambulatory Visit
Admission: RE | Admit: 2011-08-22 | Discharge: 2011-08-22 | Disposition: A | Discharge status: Home / Self Care | Payer: BC Managed Care – PPO | Source: Ambulatory Visit | Attending: Radiation Oncology | Admitting: Radiation Oncology

## 2011-08-22 ENCOUNTER — Encounter: Payer: Self-pay | Admitting: Radiation Oncology

## 2011-08-22 DIAGNOSIS — C61 Malignant neoplasm of prostate: Secondary | ICD-10-CM

## 2011-08-22 NOTE — Progress Notes (Signed)
Weekly Management Note:  Site:Prostatic fossa Current Dose:  6840  cGy Projected Dose: 6840  cGy  Narrative: The patient is seen today for routine under treatment assessment. CBCT/MVCT images/port films were reviewed. The chart was reviewed.   He continues with his Cipro for his UTI. His dysuria is improved. His major problem is urinary urgency and he does wear a diaper. He stopped taking VESIcare because it made his mouth dry and caused nausea.  He's not having any incontinence. No GI difficulties except for "a lot of gas". Radiation therapy is completed today.  Physical Examination:  Wt Readings from Last 3 Encounters:  08/22/11 131 lb 3.2 oz (59.512 kg)  08/16/11 126 lb 9.6 oz (57.425 kg)  08/09/11 130 lb (58.968 kg)   Temp Readings from Last 3 Encounters:  08/22/11 97.7 F (36.5 C)   06/10/11 97.8 F (36.6 C) Oral   BP Readings from Last 3 Encounters:  08/22/11 152/89  07/25/11 132/80  06/10/11 159/90   Pulse Readings from Last 3 Encounters:  08/22/11 82  07/25/11 83  06/10/11 105    No change.  Impression: His radiation therapy has been well tolerated.  Plan: Followup visit with Dr. Kathrynn Running on April 26.

## 2011-08-22 NOTE — Progress Notes (Addendum)
Incontinence.  Wearing depends. On Cipro since 08/12/11 for a UTI.  Burning sensation when voiding.  Denies any proctitisnor diarrhea, but has a lot of "gas.  Completes radiation therapy today.  Fu appt. given

## 2011-08-23 ENCOUNTER — Ambulatory Visit: Payer: BC Managed Care – PPO

## 2011-08-23 NOTE — Progress Notes (Signed)
  Radiation Oncology         [phone] ________________________________  Name: Rodney [name] MRN: [id number]  Date: 08/22/2011  DOB: 03/31/1951  End of Treatment Note  Diagnosis:   61 year old gentleman with a detectable PSA of 0.12 following radical prostatectomy for stage T2c. disease with Gleason's 3+4     Indication for treatment:  Salvage prostate fossa radiotherapy, curative       Radiation treatment dates:  07/02/2011-08/22/2011  Site/dose:   68.4 gray in 38 fractions of 1.8 gray  Beams/energy:   IMRT was required to treat the target volume to the prescription dose while maintaining the rectum bladder hips and small bowel below published tolerance dose levels. The patient was set up on a daily basis using an alpha cradle immobilization device and image guidance was performed with megavoltage CT scans prior to each fraction.  Narrative: The patient tolerated radiation treatment relatively well.   During the course of his radiation, the patient developed increasing urinary curative symptoms and urinary urgency. Ultimately, in early March she developed urinary incontinence. Because of this development, I did order a urinalysis with urine culture and on March 11, patient was noted to have an Escherichia coli UTI. This was treated, but, unfortunately his urinary incontinence did not resolve. The patient was born full-sized adult diapers upon completion.  Plan: The patient has completed radiation treatment. The patient will return to radiation oncology clinic for routine followup in one month. I advised them to call or return sooner if they have any questions or concerns related to their recovery or treatment. Hopefully, his urinary incontinence will improve following completion of radiation as the inflammation gradually resolves. ________________________________  Artist Pais Kathrynn Running, M.D.

## 2011-08-25 ENCOUNTER — Encounter: Payer: Self-pay | Admitting: Radiation Oncology

## 2011-08-26 ENCOUNTER — Ambulatory Visit: Payer: BC Managed Care – PPO

## 2011-09-26 ENCOUNTER — Encounter: Payer: Self-pay | Admitting: Radiation Oncology

## 2011-09-27 ENCOUNTER — Ambulatory Visit: Payer: BC Managed Care – PPO | Admitting: Radiation Oncology

## 2011-10-08 ENCOUNTER — Encounter: Payer: Self-pay | Admitting: Radiation Oncology

## 2011-10-10 ENCOUNTER — Ambulatory Visit
Admission: RE | Admit: 2011-10-10 | Payer: BC Managed Care – PPO | Source: Ambulatory Visit | Admitting: Radiation Oncology

## 2011-10-10 ENCOUNTER — Telehealth: Payer: Self-pay | Admitting: Radiation Oncology

## 2011-10-10 ENCOUNTER — Encounter: Payer: Self-pay | Admitting: Radiation Oncology

## 2011-10-10 NOTE — Telephone Encounter (Signed)
No show for 1600 appointment. Phoned cell/home number listed in demographics and requested a return call with the hopes of rescheduling. Awaiting return call. Routed message to Dr. Kathrynn Running and Milton Ferguson, RN.

## 2011-10-31 ENCOUNTER — Encounter: Payer: Self-pay | Admitting: Radiation Oncology

## 2011-10-31 ENCOUNTER — Ambulatory Visit
Admission: RE | Admit: 2011-10-31 | Discharge: 2011-10-31 | Disposition: A | Discharge status: Home / Self Care | Payer: BC Managed Care – PPO | Source: Ambulatory Visit | Attending: Radiation Oncology | Admitting: Radiation Oncology

## 2011-10-31 VITALS — BP 147/82 | HR 93 | Temp 97.7°F | Wt 128.0 lb

## 2011-10-31 DIAGNOSIS — C61 Malignant neoplasm of prostate: Secondary | ICD-10-CM

## 2011-10-31 NOTE — Progress Notes (Addendum)
Completed radiation on 08/22/11.  This is his first visit since end of treatment. Reports "good" urinary flow and urinary control.  Reports urinary urgency intermittently.  Has moist cough presently.  Afebrile.  Reports PSA 0.04 last month as drawn by Dr. Isabel Caprice.

## 2011-10-31 NOTE — Progress Notes (Signed)
  Radiation Oncology         [phone] ________________________________  Name: Rodney [name] MRN: [id number]  Date: 10/31/2011  DOB: 31-Jul-1950  Follow-Up Visit Note  CC: Irving Copas, MD, MD  Valetta Fuller, MD  Diagnosis:   61 year old gentleman with a detectable PSA of 0.12 following radical prostatectomy for stage T2c. disease with Gleason's 3+4  Interval Since Last Radiation:  1 months  Narrative:  The patient returns today for routine follow-up.  Is is urinary incontinence which occurred during his course of radiation have resolved. He does continue to suffer with some bladder urgency but maintains control. He denies any bowel complaints whatsoever.                              Meds: Current Outpatient Prescriptions  Medication Sig Dispense Refill  . solifenacin (VESICARE) 5 MG tablet Take 1 tablet (5 mg total) by mouth daily.  30 tablet  5   Physical Findings: The patient is in no acute distress. Patient is alert and oriented. Filed Vitals:   10/31/11 1601  BP: 147/82  Pulse: 93  Temp: 97.7 F (36.5 C)  .  No significant changes.  Lab Findings: Lab Results  Component Value Date   WBC 7.4 05/04/2009   HGB 12.0* 05/04/2009   HCT 34.3* 05/04/2009   MCV 98.6 05/04/2009   PLT 245 05/04/2009   Impression:  The patient is recovering from the effects of radiation.  His first PSA value was 0.04 representing a decrease from his baseline prior radiation.  Plan:  The patient will continue to follow with Dr. Isabel Caprice for ongoing PSA determinations. He'll return to radiation oncology clinic for an as-needed basis. Today, talked about some long-term side effects associated with radiation treatment and I encouraged him to call our office or return at any point if he has additional concerns related to his previous radiation therapy.  _____________________________________  Artist Pais Kathrynn Running, M.D.   and and

## 2011-11-01 ENCOUNTER — Encounter: Payer: Self-pay | Admitting: Radiation Oncology

## 2012-02-25 ENCOUNTER — Emergency Department (HOSPITAL_COMMUNITY): Payer: BC Managed Care – PPO

## 2012-02-25 ENCOUNTER — Encounter (HOSPITAL_COMMUNITY)
Admission: EM | Disposition: A | Discharge status: Home / Self Care | Payer: Self-pay | Source: Home / Self Care | Attending: Cardiology

## 2012-02-25 ENCOUNTER — Encounter (HOSPITAL_COMMUNITY): Payer: Self-pay

## 2012-02-25 ENCOUNTER — Inpatient Hospital Stay (HOSPITAL_COMMUNITY)
Admission: EM | Admit: 2012-02-25 | Discharge: 2012-02-27 | DRG: 853 | Disposition: A | Discharge status: Home / Self Care | Payer: BC Managed Care – PPO | Attending: Cardiology | Admitting: Cardiology

## 2012-02-25 DIAGNOSIS — E7800 Pure hypercholesterolemia, unspecified: Secondary | ICD-10-CM | POA: Diagnosis present

## 2012-02-25 DIAGNOSIS — I214 Non-ST elevation (NSTEMI) myocardial infarction: Principal | ICD-10-CM | POA: Diagnosis present

## 2012-02-25 DIAGNOSIS — Z8546 Personal history of malignant neoplasm of prostate: Secondary | ICD-10-CM

## 2012-02-25 DIAGNOSIS — Z923 Personal history of irradiation: Secondary | ICD-10-CM

## 2012-02-25 DIAGNOSIS — R7309 Other abnormal glucose: Secondary | ICD-10-CM | POA: Diagnosis present

## 2012-02-25 DIAGNOSIS — R079 Chest pain, unspecified: Secondary | ICD-10-CM

## 2012-02-25 DIAGNOSIS — I472 Ventricular tachycardia, unspecified: Secondary | ICD-10-CM | POA: Diagnosis present

## 2012-02-25 DIAGNOSIS — E871 Hypo-osmolality and hyponatremia: Secondary | ICD-10-CM | POA: Diagnosis present

## 2012-02-25 DIAGNOSIS — F17200 Nicotine dependence, unspecified, uncomplicated: Secondary | ICD-10-CM | POA: Diagnosis present

## 2012-02-25 DIAGNOSIS — Z8249 Family history of ischemic heart disease and other diseases of the circulatory system: Secondary | ICD-10-CM

## 2012-02-25 DIAGNOSIS — I4729 Other ventricular tachycardia: Secondary | ICD-10-CM | POA: Diagnosis present

## 2012-02-25 DIAGNOSIS — I2510 Atherosclerotic heart disease of native coronary artery without angina pectoris: Secondary | ICD-10-CM | POA: Diagnosis present

## 2012-02-25 HISTORY — PX: LEFT HEART CATHETERIZATION WITH CORONARY ANGIOGRAM: SHX5451

## 2012-02-25 LAB — COMPREHENSIVE METABOLIC PANEL
AST: 35 U/L (ref 0–37)
Albumin: 4 g/dL (ref 3.5–5.2)
Alkaline Phosphatase: 118 U/L — ABNORMAL HIGH (ref 39–117)
BUN: 5 mg/dL — ABNORMAL LOW (ref 6–23)
Chloride: 93 mEq/L — ABNORMAL LOW (ref 96–112)
Potassium: 4.1 mEq/L (ref 3.5–5.1)
Total Bilirubin: 0.3 mg/dL (ref 0.3–1.2)
Total Protein: 7 g/dL (ref 6.0–8.3)

## 2012-02-25 LAB — CBC WITH DIFFERENTIAL/PLATELET
Basophils Absolute: 0.1 10*3/uL (ref 0.0–0.1)
Basophils Relative: 1 % (ref 0–1)
Eosinophils Absolute: 0 10*3/uL (ref 0.0–0.7)
Eosinophils Relative: 0 % (ref 0–5)
HCT: 39.2 % (ref 39.0–52.0)
Hemoglobin: 14.1 g/dL (ref 13.0–17.0)
Lymphocytes Relative: 10 % — ABNORMAL LOW (ref 12–46)
Lymphs Abs: 0.8 10*3/uL (ref 0.7–4.0)
MCH: 34 pg (ref 26.0–34.0)
MCHC: 35 g/dL (ref 30.0–36.0)
MCV: 94.5 fL (ref 78.0–100.0)
Monocytes Absolute: 0.4 10*3/uL (ref 0.1–1.0)
Monocytes Absolute: 0.7 10*3/uL (ref 0.1–1.0)
Monocytes Relative: 4 % (ref 3–12)
Neutro Abs: 6.5 10*3/uL (ref 1.7–7.7)
Neutrophils Relative %: 80 % — ABNORMAL HIGH (ref 43–77)
Platelets: 300 10*3/uL (ref 150–400)
Platelets: 317 10*3/uL (ref 150–400)
RBC: 4.15 MIL/uL — ABNORMAL LOW (ref 4.22–5.81)
RDW: 12.1 % (ref 11.5–15.5)
WBC: 8.2 10*3/uL (ref 4.0–10.5)

## 2012-02-25 LAB — GLUCOSE, CAPILLARY: Glucose-Capillary: 159 mg/dL — ABNORMAL HIGH (ref 70–99)

## 2012-02-25 LAB — PROTIME-INR: INR: 0.88 (ref 0.00–1.49)

## 2012-02-25 LAB — BASIC METABOLIC PANEL
BUN: 4 mg/dL — ABNORMAL LOW (ref 6–23)
Calcium: 9.3 mg/dL (ref 8.4–10.5)
Creatinine, Ser: 0.67 mg/dL (ref 0.50–1.35)
GFR calc non Af Amer: >90 mL/min (ref >90)
Glucose, Bld: 162 mg/dL — ABNORMAL HIGH (ref 70–99)

## 2012-02-25 LAB — MAGNESIUM: Magnesium: 2 mg/dL (ref 1.5–2.5)

## 2012-02-25 LAB — POCT ACTIVATED CLOTTING TIME: Activated Clotting Time: 299 seconds

## 2012-02-25 LAB — APTT: aPTT: 32 seconds (ref 24–37)

## 2012-02-25 LAB — TROPONIN I: Troponin I: >20 ng/mL (ref <0.30)

## 2012-02-25 SURGERY — LEFT HEART CATHETERIZATION WITH CORONARY ANGIOGRAM
Anesthesia: LOCAL

## 2012-02-25 MED ORDER — SODIUM CHLORIDE 0.9 % IJ SOLN: 3.0000 mL | Freq: Two times a day (BID) | INTRAMUSCULAR | Status: DC

## 2012-02-25 MED ORDER — ENOXAPARIN SODIUM 40 MG/0.4ML ~~LOC~~ SOLN
40.0000 mg | SUBCUTANEOUS | Status: DC
  Filled 2012-02-25: qty 0.4

## 2012-02-25 MED ORDER — SODIUM CHLORIDE 0.9 % IV SOLN: INTRAVENOUS | Status: AC

## 2012-02-25 MED ORDER — LIDOCAINE HCL (PF) 1 % IJ SOLN
INTRAMUSCULAR | Status: AC
  Filled 2012-02-25: qty 30

## 2012-02-25 MED ORDER — OXYCODONE-ACETAMINOPHEN 5-325 MG PO TABS: 1.0000 | ORAL_TABLET | ORAL | Status: DC | PRN

## 2012-02-25 MED ORDER — PRASUGREL HCL 10 MG PO TABS
ORAL_TABLET | ORAL | Status: AC
  Administered 2012-02-26: 10:00:00 10 mg via ORAL
  Filled 2012-02-25: qty 6

## 2012-02-25 MED ORDER — FENTANYL CITRATE 0.05 MG/ML IJ SOLN
INTRAMUSCULAR | Status: AC
  Filled 2012-02-25: qty 2

## 2012-02-25 MED ORDER — ONDANSETRON HCL 4 MG/2ML IJ SOLN: 4.0000 mg | Freq: Four times a day (QID) | INTRAMUSCULAR | Status: DC | PRN

## 2012-02-25 MED ORDER — MIDAZOLAM HCL 2 MG/2ML IJ SOLN
INTRAMUSCULAR | Status: AC
  Filled 2012-02-25: qty 2

## 2012-02-25 MED ORDER — SODIUM CHLORIDE 0.9 % IV SOLN: 250.0000 mL | INTRAVENOUS | Status: DC | PRN

## 2012-02-25 MED ORDER — HEPARIN (PORCINE) IN NACL 2-0.9 UNIT/ML-% IJ SOLN
INTRAMUSCULAR | Status: AC
  Filled 2012-02-25: qty 1000

## 2012-02-25 MED ORDER — ATORVASTATIN CALCIUM 80 MG PO TABS
80.0000 mg | ORAL_TABLET | Freq: Every day | ORAL | Status: DC
  Administered 2012-02-25 – 2012-02-26 (×2): 80 mg via ORAL
  Filled 2012-02-25 (×4): qty 1

## 2012-02-25 MED ORDER — SODIUM CHLORIDE 0.9 % IV SOLN: INTRAVENOUS | Status: DC

## 2012-02-25 MED ORDER — ACETAMINOPHEN 325 MG PO TABS: 650.0000 mg | ORAL_TABLET | ORAL | Status: DC | PRN

## 2012-02-25 MED ORDER — SODIUM CHLORIDE 0.9 % IV SOLN
0.2500 mg/kg/h | INTRAVENOUS | Status: AC
  Filled 2012-02-25: qty 250

## 2012-02-25 MED ORDER — DIAZEPAM 5 MG PO TABS: 5.0000 mg | ORAL_TABLET | ORAL | Status: DC

## 2012-02-25 MED ORDER — NITROGLYCERIN 2 % TD OINT
0.5000 [in_us] | TOPICAL_OINTMENT | TRANSDERMAL | Status: AC
  Administered 2012-02-25: 0.5 [in_us] via TOPICAL
  Filled 2012-02-25: qty 1

## 2012-02-25 MED ORDER — NITROGLYCERIN 0.2 MG/ML ON CALL CATH LAB
INTRAVENOUS | Status: AC
  Filled 2012-02-25: qty 1

## 2012-02-25 MED ORDER — INSULIN ASPART 100 UNIT/ML ~~LOC~~ SOLN: 0.0000 [IU] | Freq: Three times a day (TID) | SUBCUTANEOUS | Status: DC

## 2012-02-25 MED ORDER — NITROGLYCERIN IN D5W 200-5 MCG/ML-% IV SOLN
5.0000 ug/min | INTRAVENOUS | Status: DC
  Administered 2012-02-25: 5 ug/min via INTRAVENOUS

## 2012-02-25 MED ORDER — ASPIRIN 81 MG PO CHEW: 324.0000 mg | CHEWABLE_TABLET | ORAL | Status: DC

## 2012-02-25 MED ORDER — NITROGLYCERIN 0.4 MG SL SUBL: 0.4000 mg | SUBLINGUAL_TABLET | SUBLINGUAL | Status: DC | PRN

## 2012-02-25 MED ORDER — PANTOPRAZOLE SODIUM 40 MG PO TBEC
40.0000 mg | DELAYED_RELEASE_TABLET | Freq: Every day | ORAL | Status: DC
  Administered 2012-02-25 – 2012-02-27 (×3): 40 mg via ORAL
  Filled 2012-02-25 (×3): qty 1

## 2012-02-25 MED ORDER — NITROGLYCERIN IN D5W 200-5 MCG/ML-% IV SOLN
INTRAVENOUS | Status: AC
  Filled 2012-02-25: qty 250

## 2012-02-25 MED ORDER — ASPIRIN 300 MG RE SUPP
300.0000 mg | RECTAL | Status: DC
  Filled 2012-02-25: qty 1

## 2012-02-25 MED ORDER — METOPROLOL TARTRATE 12.5 MG HALF TABLET
12.5000 mg | ORAL_TABLET | Freq: Two times a day (BID) | ORAL | Status: DC
  Administered 2012-02-25 – 2012-02-27 (×4): 12.5 mg via ORAL
  Filled 2012-02-25 (×7): qty 1

## 2012-02-25 MED ORDER — NITROGLYCERIN 2 % TD OINT
0.5000 [in_us] | TOPICAL_OINTMENT | Freq: Four times a day (QID) | TRANSDERMAL | Status: DC
  Filled 2012-02-25: qty 30

## 2012-02-25 MED ORDER — ASPIRIN EC 81 MG PO TBEC
81.0000 mg | DELAYED_RELEASE_TABLET | Freq: Every day | ORAL | Status: DC
  Administered 2012-02-26 – 2012-02-27 (×2): 81 mg via ORAL
  Filled 2012-02-25 (×2): qty 1

## 2012-02-25 MED ORDER — PRASUGREL HCL 10 MG PO TABS
10.0000 mg | ORAL_TABLET | Freq: Every day | ORAL | Status: DC
  Administered 2012-02-26 – 2012-02-27 (×2): 10 mg via ORAL
  Filled 2012-02-25 (×3): qty 1

## 2012-02-25 MED ORDER — BIVALIRUDIN 250 MG IV SOLR
INTRAVENOUS | Status: AC
  Filled 2012-02-25: qty 250

## 2012-02-25 MED ORDER — ASPIRIN 81 MG PO CHEW: 81.0000 mg | CHEWABLE_TABLET | Freq: Every day | ORAL | Status: DC

## 2012-02-25 MED ORDER — SODIUM CHLORIDE 0.9 % IJ SOLN: 3.0000 mL | INTRAMUSCULAR | Status: DC | PRN

## 2012-02-25 NOTE — ED Notes (Signed)
MD at bedside. 

## 2012-02-25 NOTE — CV Procedure (Signed)
Left cardiac cath/PTCA stent report dictated on 02/25/2012 dictation number is [id number]

## 2012-02-25 NOTE — ED Notes (Signed)
Cardiology MD at bedside.

## 2012-02-25 NOTE — ED Notes (Signed)
Wife at bedside.

## 2012-02-25 NOTE — Progress Notes (Signed)
Pt troponin, 4.04 just resulted Dr Sharyn Lull Made aware. Will be down to see patient will continue to monitor patient. Laaibah Wartman, Randall An RN

## 2012-02-25 NOTE — ED Notes (Signed)
Chest pain while driving this am. Drove to the fire station who called EMS. Pt given 3 NTG and 1 324mg  asa on route to hospital per EMS. Pt pain free om arrival

## 2012-02-25 NOTE — ED Provider Notes (Signed)
History     CSN: [id number]  Arrival date & time 02/25/12  1002   First MD Initiated Contact with Patient 02/25/12 1007      Chief Complaint  Patient presents with  . Chest Pain    (Consider location/radiation/quality/duration/timing/severity/associated sxs/prior treatment) Patient is a 61 y.o. male presenting with chest pain. The history is provided by the patient.  Chest Pain Episode onset: 2-3 hrs ago. Chest pain occurs constantly. The chest pain is improving. Associated with: unknown. At its most intense, the pain is at 1/10. The pain is currently at 1/10. The severity of the pain is mild. The quality of the pain is described as pressure-like. The pain does not radiate. Exacerbated by: nothing. Pertinent negatives for primary symptoms include no fever, no shortness of breath, no cough, no abdominal pain, no nausea and no vomiting.  Pertinent negatives for associated symptoms include no numbness. He tried nitroglycerin and aspirin for the symptoms. Risk factors: male, tobacco abuse.     Past Medical History  Diagnosis Date  . Prostate cancer 05/03/2009    Prostatectomy/Adenocrcinoma  . Heart murmur     history  . Cataract     left eye surgery/   . Radiation 07/02/11-08/22/11    Prostate fossa 68.4 gray 38 fractions    Past Surgical History  Procedure Date  . Left eye surgery   . Hemorrhoid surgery   . Robot assisted laparoscopic radical prostatectomy 05/03/2009  . Wrist surgery     left    Family History  Problem Relation Age of Onset  . Uterine cancer Mother 79    going to [facility]  . Breast cancer Sister     History  Substance Use Topics  . Smoking status: Current Every Day Smoker -- 2.0 packs/day for 36 years    Types: Cigarettes  . Smokeless tobacco: Not on file  . Alcohol Use: 3.6 oz/week    6 Cans of beer per week     6-7  cans drinks daily 12 oz      Review of Systems  Constitutional: Negative for fever.  HENT: Negative for rhinorrhea, drooling and  neck pain.   Eyes: Negative for pain.  Respiratory: Negative for cough and shortness of breath.   Cardiovascular: Positive for chest pain. Negative for leg swelling.  Gastrointestinal: Negative for nausea, vomiting, abdominal pain and diarrhea.  Genitourinary: Negative for dysuria and hematuria.  Musculoskeletal: Negative for gait problem.  Skin: Negative for color change.  Neurological: Negative for numbness and headaches.  Hematological: Negative for adenopathy.  Psychiatric/Behavioral: Negative for behavioral problems.  All other systems reviewed and are negative.    Allergies  Other  Home Medications  No current outpatient prescriptions on file.  BP 144/82  Pulse 91  Temp 97.9 F (36.6 C) (Oral)  Resp 18  Physical Exam  Nursing note and vitals reviewed. Constitutional: He is oriented to person, place, and time. He appears well-developed and well-nourished.  HENT:  Head: Normocephalic and atraumatic.  Right Ear: External ear normal.  Left Ear: External ear normal.  Nose: Nose normal.  Mouth/Throat: Oropharynx is clear and moist. No oropharyngeal exudate.  Eyes: Conjunctivae normal and EOM are normal. Pupils are equal, round, and reactive to light.  Neck: Normal range of motion. Neck supple.  Cardiovascular: Normal rate, regular rhythm, normal heart sounds and intact distal pulses.  Exam reveals no gallop and no friction rub.   No murmur heard. Pulmonary/Chest: Effort normal and breath sounds normal. No respiratory distress. He has  no wheezes.  Abdominal: Soft. Bowel sounds are normal. He exhibits no distension. There is no tenderness. There is no rebound and no guarding.  Musculoskeletal: Normal range of motion. He exhibits no edema and no tenderness.  Neurological: He is alert and oriented to person, place, and time.  Skin: Skin is warm and dry.  Psychiatric: He has a normal mood and affect. His behavior is normal.    ED Course  Procedures (including critical care  time)  Labs Reviewed - No data to display No results found.   No diagnosis found.   Date: 02/25/2012  Rate: 77  Rhythm: normal sinus rhythm  QRS Axis: borderline RAD  Intervals: normal  ST/T Wave abnormalities: normal  Conduction Disutrbances:none  Narrative Interpretation: No ST or T wave changes cw ischemia  Old EKG Reviewed: none available   MDM  10:25 AM 61 y.o. male w hx of tobacco abuse, FH of MI (sister) pw chest pressure that began at approx 8am this morning while driving. Pt notes pain 1/10, got ASA and nitro en route which helped. Pt AFVSS here, appears well on exam. Timi 1, Heart score 4. Will get screening labs.   Cards to admit for r/o.  Clinical Impression Chest pain.       Purvis Sheffield, MD 02/25/12 2235

## 2012-02-25 NOTE — H&P (Signed)
Rodney [name] is an 61 y.o. male.   Chief Complaint: Chest pain/left arm elbow pain HPI: Patient is 61 year old male with no significant past medical history except for tobacco abuse and positive family history of coronary artery disease came to the ER by EMS complaining of retrosternal chest pain described as pressure/tightness radiating to left arm and left elbow while driving. Patient denies any nausea vomiting diaphoresis denies palpitation lightheadedness or syncope he went to fire Department received 4 baby aspirin and 3 sublingual nitroglycerin with relief of chest pain. Patient states chest pain was grade 1-2/10. Denies any chest pain at present. Denies any history of exertional chest pain. Denies any recent cardiac workup. Patient states he smokes 2 packs per day for last 39 years and drinks beer socially denies any drug abuse. States this Sr. had MI at age of 71. His father died at young age cause not known.  Past Medical History  Diagnosis Date  . Prostate cancer 05/03/2009    Prostatectomy/Adenocrcinoma  . Heart murmur     history  . Cataract     left eye surgery/   . Radiation 07/02/11-08/22/11    Prostate fossa 68.4 gray 38 fractions    Past Surgical History  Procedure Date  . Left eye surgery   . Hemorrhoid surgery   . Robot assisted laparoscopic radical prostatectomy 05/03/2009  . Wrist surgery     left    Family History  Problem Relation Age of Onset  . Uterine cancer Mother 69    going to [facility]  . Breast cancer Sister    Social History:  reports that he has been smoking Cigarettes.  He has a 72 pack-year smoking history. He does not have any smokeless tobacco history on file. He reports that he drinks about 3.6 ounces of alcohol per week. He reports that he does not use illicit drugs.  Allergies:  Allergies  Allergen Reactions  . Other Rash    polyestor and metals except titanium     (Not in a hospital admission)  Results for orders placed during the  hospital encounter of 02/25/12 (from the past 48 hour(s))  CBC WITH DIFFERENTIAL     Status: Abnormal   Collection Time   02/25/12 10:25 AM      Component Value Range Comment   WBC 10.5  4.0 - 10.5 K/uL    RBC 4.15 (*) 4.22 - 5.81 MIL/uL    Hemoglobin 14.1  13.0 - 17.0 g/dL    HCT 21.3  08.6 - 57.8 %    MCV 94.5  78.0 - 100.0 fL    MCH 34.0  26.0 - 34.0 pg    MCHC 36.0  30.0 - 36.0 g/dL    RDW 46.9  62.9 - 52.8 %    Platelets 300  150 - 400 K/uL    Neutrophils Relative 87 (*) 43 - 77 %    Neutro Abs 9.2 (*) 1.7 - 7.7 K/uL    Lymphocytes Relative 8 (*) 12 - 46 %    Lymphs Abs 0.8  0.7 - 4.0 K/uL    Monocytes Relative 4  3 - 12 %    Monocytes Absolute 0.4  0.1 - 1.0 K/uL    Eosinophils Relative 0  0 - 5 %    Eosinophils Absolute 0.0  0.0 - 0.7 K/uL    Basophils Relative 1  0 - 1 %    Basophils Absolute 0.1  0.0 - 0.1 K/uL   BASIC METABOLIC PANEL  Status: Abnormal   Collection Time   02/25/12 10:25 AM      Component Value Range Comment   Sodium 128 (*) 135 - 145 mEq/L    Potassium 4.3  3.5 - 5.1 mEq/L    Chloride 92 (*) 96 - 112 mEq/L    CO2 27  19 - 32 mEq/L    Glucose, Bld 162 (*) 70 - 99 mg/dL    BUN 4 (*) 6 - 23 mg/dL    Creatinine, Ser 4.09  0.50 - 1.35 mg/dL    Calcium 9.3  8.4 - 81.1 mg/dL    GFR calc non Af Amer >90  >90 mL/min    GFR calc Af Amer >90  >90 mL/min   TROPONIN I     Status: Normal   Collection Time   02/25/12 10:28 AM      Component Value Range Comment   Troponin I <0.30  <0.30 ng/mL    Dg Chest Port 1 View  02/25/2012  *RADIOLOGY REPORT*  Clinical Data: Mid chest pain  PORTABLE CHEST - 1 VIEW  Comparison: 05/02/2009  Findings: Heart size upper normal. Interstitial prominence may be exaggerated by technique.  Mild left lung base opacity.  The no pleural effusion or pneumothorax.  Multilevel degenerative changes. No acute osseous finding.  IMPRESSION: Mild left lung base opacity is likely scarring or atelectasis.  Heart size upper normal.  Mild  interstitial prominence is nonspecific and may be accentuated by portable technique.  Attention at two-view follow-up when the patient can tolerate.   Original Report Authenticated By: Waneta Martins, M.D.     Review of Systems  Constitutional: Negative for fever, chills and weight loss.  Eyes: Negative for blurred vision.  Respiratory: Negative for cough, hemoptysis, sputum production and shortness of breath.   Cardiovascular: Positive for chest pain. Negative for palpitations, orthopnea, claudication, leg swelling and PND.  Gastrointestinal: Negative for heartburn, nausea, vomiting and abdominal pain.  Genitourinary: Positive for dysuria.  Neurological: Negative for dizziness and headaches.    Blood pressure 130/76, pulse 72, temperature 97.9 F (36.6 C), temperature source Oral, resp. rate 18, SpO2 100.00%. Physical Exam  Constitutional: He is oriented to person, place, and time.  HENT:  Head: Normocephalic and atraumatic.  Nose: Nose normal.  Mouth/Throat: No oropharyngeal exudate.  Eyes: Conjunctivae normal are normal. Pupils are equal, round, and reactive to light. No scleral icterus.  Neck: Normal range of motion. Neck supple. No JVD present. No tracheal deviation present. No thyromegaly present.  Cardiovascular: Normal rate and regular rhythm.   Murmur (Soft systolic murmur noted no S3 gallop) heard. Respiratory:       Decreased breath sound at bases  GI: Soft. He exhibits no distension. There is no tenderness. There is no rebound and no guarding.  Musculoskeletal: He exhibits no edema and no tenderness.  Neurological: He is alert and oriented to person, place, and time.     Assessment/Plan Unstable angina rule out MI Tobacco abuse Positive family history of coronary artery disease Plan As per orders Paytan Recine N 02/25/2012, 12:02 PM

## 2012-02-25 NOTE — Progress Notes (Signed)
Subjective:  Patient presently denies any chest pain or shortness of breath. Second set of troponin I. is markedly elevated. Discussed with patient and his wife regarding elevated cardiac enzymes and left cath possible PTCA stenting its risk and benefits i.e. death MI stroke need for emergency CABG risk of restenosis local vascular complications etc. and consents for PCI  Objective:  Vital Signs in the last 24 hours: Temp:  [97.9 F (36.6 C)-98 F (36.7 C)] 98 F (36.7 C) (09/24 1327) Pulse Rate:  [71-91] 89  (09/24 1327) Resp:  [12-18] 18  (09/24 1327) BP: (130-154)/(69-100) 147/88 mmHg (09/24 1339) SpO2:  [100 %] 100 % (09/24 1245) Weight:  [57.3 kg (126 lb 5.2 oz)] 57.3 kg (126 lb 5.2 oz) (09/24 1327)  Intake/Output from previous day:   Intake/Output from this shift: Total I/O In: 240 [P.O.:240] Out: -   Physical Exam: Neck: no adenopathy, no carotid bruit, no JVD and supple, symmetrical, trachea midline Lungs: Decreased breath sound at bases Heart: regular rate and rhythm, S1, S2 normal and Soft systolic murmur noted Abdomen: soft, non-tender; bowel sounds normal; no masses,  no organomegaly Extremities: extremities normal, atraumatic, no cyanosis or edema Pulses: 2+ and symmetric  Lab Results:  Basename 02/25/12 1351 02/25/12 1025  WBC 8.2 10.5  HGB 14.4 14.1  PLT 317 300    Basename 02/25/12 1351 02/25/12 1025  NA 129* 128*  K 4.1 4.3  CL 93* 92*  CO2 26 27  GLUCOSE 111* 162*  BUN 5* 4*  CREATININE 0.61 0.67    Basename 02/25/12 1352 02/25/12 1028  TROPONINI 4.04* <0.30   Hepatic Function Panel  Basename 02/25/12 1351  PROT 7.0  ALBUMIN 4.0  AST 35  ALT 16  ALKPHOS 118*  BILITOT 0.3  BILIDIR --  IBILI --   No results found for this basename: CHOL in the last 72 hours No results found for this basename: PROTIME in the last 72 hours  Imaging: Imaging results have been reviewed and Dg Chest Port 1 View  02/25/2012  *RADIOLOGY REPORT*  Clinical  Data: Mid chest pain  PORTABLE CHEST - 1 VIEW  Comparison: 05/02/2009  Findings: Heart size upper normal. Interstitial prominence may be exaggerated by technique.  Mild left lung base opacity.  The no pleural effusion or pneumothorax.  Multilevel degenerative changes. No acute osseous finding.  IMPRESSION: Mild left lung base opacity is likely scarring or atelectasis.  Heart size upper normal.  Mild interstitial prominence is nonspecific and may be accentuated by portable technique.  Attention at two-view follow-up when the patient can tolerate.   Original Report Authenticated By: Waneta Martins, M.D.     Cardiac Studies:  Assessment/Plan:  Acute coronary syndrome Elevated blood sugar rule out diabetes mellitus Hyponatremia Tobacco abuse Positive family history of coronary artery disease Plan Discussed with patient and his wife regarding left eye possible PTCA stenting as above and consents for PCI  LOS: 0 days    Mell Guia N 02/25/2012, 3:14 PM

## 2012-02-26 LAB — CBC
HCT: 39.3 % (ref 39.0–52.0)
Hemoglobin: 14 g/dL (ref 13.0–17.0)
MCH: 33.7 pg (ref 26.0–34.0)
MCHC: 35.6 g/dL (ref 30.0–36.0)
RBC: 4.15 MIL/uL — ABNORMAL LOW (ref 4.22–5.81)

## 2012-02-26 LAB — BASIC METABOLIC PANEL
BUN: 6 mg/dL (ref 6–23)
CO2: 25 mEq/L (ref 19–32)
Calcium: 9.4 mg/dL (ref 8.4–10.5)
GFR calc non Af Amer: >90 mL/min (ref >90)
Glucose, Bld: 99 mg/dL (ref 70–99)

## 2012-02-26 LAB — LIPID PANEL
Cholesterol: 150 mg/dL (ref 0–200)
Total CHOL/HDL Ratio: 2.3 RATIO
VLDL: 18 mg/dL (ref 0–40)

## 2012-02-26 LAB — TSH: TSH: 0.779 u[IU]/mL (ref 0.350–4.500)

## 2012-02-26 MED FILL — Dextrose Inj 5%: INTRAVENOUS | Qty: 50 | Status: AC

## 2012-02-26 NOTE — ED Provider Notes (Signed)
I saw and evaluated the patient, reviewed the resident's note and I agree with the findings and plan.   .Face to face Exam:  General:  Awake HEENT:  Atraumatic Resp:  Normal effort Abd:  Nondistended Neuro:No focal weakness Lymph: No adenopathy   Nelia Shi, MD 02/26/12 [phone]

## 2012-02-26 NOTE — Cardiovascular Report (Signed)
NAME:  Rodney [name], Rodney [name]                ACCOUNT NO.:  [id number]  MEDICAL RECORD NO.:  [id number]  LOCATION:  6525                         FACILITY:  [facility]  PHYSICIAN:  Zorawar Strollo N. Sharyn Lull, M.D. DATE OF BIRTH:  Jul 26, 1950  DATE OF PROCEDURE:  02/25/2012 DATE OF DISCHARGE:                           CARDIAC CATHETERIZATION   PROCEDURE: 1. Left cardiac cath with selective left and right coronary     angiography, left ventriculography via right groin using Judkins     technique. 2. Successful PTCA to proximal left circumflex using 2.5 x 8-mm long     Trek balloon. 3. Successful deployment of 3.5 x 15-mm long Xience Xpedition drug-     eluting stent in proximal left circumflex. 4. Successful postdilatation of this stent using 3.75 x 12-mm long [place]     Trek balloon.  INDICATION FOR THE PROCEDURE:  Rodney [name] is a 61 year old male with no significant past medical history except for tobacco abuse, positive family history of coronary artery disease, he came to the ER by EMS complaining of retrosternal chest pain described as pressure, tightness radiating to the left arm and left elbow while driving.  The patient denies any nausea, vomiting, diaphoresis.  Denies palpitation, lightheadedness, or syncope.  He went to the [employer], received 4 baby aspirin and 3 sublingual nitroglycerin with relief of chest pain. The patient states chest pain was grade 1-2/10, denies any chest pain at present.  Denies history of exertional chest pain.  Denies any cardiac workup in the past.  The patient states he smokes 2 packs per day for last 39 years and drinks beer socially.  Denies any drug abuse.  His sister had MI at the age of 55 and his father died at young age, cause not known.  EKG done in the ER showed normal sinus rhythm with no acute ischemic changes.  The patient was admitted to telemetry unit.  His first set of cardiac enzyme was negative.  Second set of troponin-I was 4.04.  Due to typical  anginal chest pain and elevated cardiac enzymes and risk factors, I discussed with the patient regarding left cath, possible PTCA, stenting, its risks and benefits, i.e., death, MI, stroke, need for emergency CABG, local vascular complications, etc., and consented for PCI.  PROCEDURE:  After obtaining the informed consent, the patient was brought to the Cath Lab and was placed on fluoroscopy table.  Right groin was prepped and draped in usual fashion.  Xylocaine 1% was used for local anesthesia in the right groin.  With the help of thin wall needle, 6-French arterial sheath was placed.  The sheath was aspirated and flushed.  Next, 6-French left Judkins catheter was advanced over the wire under fluoroscopic guidance up to the ascending aorta.  Wire was pulled out, the catheter was aspirated and connected to the Manifold. Catheter was further advanced and engaged into left coronary ostium. Multiple views of the left system were taken.  Next, catheter was disengaged and was pulled out over the wire and was replaced with 6- French 3.5 left Judkins catheter, which was advanced under fluoroscopic guidance up to the ascending aorta.  Wire was pulled out, the catheter was aspirated and  connected to the Manifold.  Catheter was further advanced and engaged selectively into the LAD.  Multiple views of the left system were taken.  Next, the catheter was disengaged and was pulled out over the wire and was replaced with 6-French right Judkins catheter, which was advanced over the wire under fluoroscopic guidance up to the ascending aorta.  Wire was pulled out, the catheter was aspirated and connected to the Manifold.  Catheter was further advanced and engaged into the right coronary ostium.  Multiple views of the right system were taken.  Catheter was disengaged and was pulled out over the wire and was replaced with 6-French pigtail catheter, which was advanced over the wire under fluoroscopic  guidance up to the ascending aorta.  Wire was pulled out.  The catheter was aspirated and connected to the Manifold. Catheter was further advanced across the aortic valve into the LV.  LV pressures were recorded.  Next, left ventriculography was done in 30- degree RAO position.  Post-angiographic pressures were recorded from LV and then pullback pressures were recorded from the aorta.  There was no significant gradient across the aortic valve.  Next, the pigtail catheter was pulled out over the wire, sheaths were aspirated and flushed.  FINDINGS:  LV showed mild-to-moderate inferior mid-wall hypokinesia, EF of approximately 50%.  Left main was absent, LAD and left circumflex has separate ostium.  LAD has 25-30% proximal and 50-60% bifurcation stenosis with diagonal 2.  Diagonal 1 is very very small.  Diagonal 2 is small, which is patent.  Diagonal 3 is very very small.  Left circumflex has 70-75% proximal stenosis with haziness, which appears to be the culprit lesion for his non-STEMI.  OM1 is very small, which is patent. OM2 which is moderate size and has 20-30% proximal and mid-stenosis. RCA has 20-25% mid and distal stenosis.  PDA and PLV branches were small, which were patent.  INTERVENTIONAL PROCEDURE:  Successful PTCA to proximal left circumflex was done using 2.5 x 8-mm long Trek balloon for predilatation and then 3.5 x 15-mm long Xience Xpedition drug-eluting stent was deployed at 11 atmospheric pressure.  The stent was postdilated using 3.75 x 12-mm long [place] Trek balloon going up to 18 atmospheric pressure.  Lesion dilated from 70-75% to 0% residual with excellent TIMI grade 3 distal flow without evidence of dissection or distal embolization.  The patient received weight based Angiomax, 60 mg of prasugrel during the procedure. The patient tolerated the procedure well.  There were no complications. The patient was transferred to recovery room in stable condition.     Eduardo Osier. Sharyn Lull, M.D.     [facility]/MED[facility]  D:  02/25/2012  T:  02/26/2012  Job:  [id number]

## 2012-02-26 NOTE — Progress Notes (Signed)
Subjective:  Patient denies any chest pain shortness of breath or palpitation. Had 15 beats of nonsustained VT earlier today asymptomatic  Objective:  Vital Signs in the last 24 hours: Temp:  [97.9 F (36.6 C)-98.2 F (36.8 C)] 98 F (36.7 C) (09/25 0826) Pulse Rate:  [57-89] 61  (09/25 0826) Resp:  [10-19] 17  (09/25 0826) BP: (133-157)/(61-100) 137/69 mmHg (09/25 0826) SpO2:  [99 %-100 %] 100 % (09/25 0826) Weight:  [57.3 kg (126 lb 5.2 oz)-61.462 kg (135 lb 8 oz)] 61.462 kg (135 lb 8 oz) (09/25 0425)  Intake/Output from previous day: 09/24 0701 - 09/25 0700 In: 515 [P.O.:240; I.V.:275] Out: -  Intake/Output from this shift:    Physical Exam: Neck: no adenopathy, no carotid bruit, no JVD and supple, symmetrical, trachea midline Lungs: clear to auscultation bilaterally Heart: regular rate and rhythm, S1, S2 normal, no murmur, click, rub or gallop Abdomen: soft, non-tender; bowel sounds normal; no masses,  no organomegaly Extremities: extremities normal, atraumatic, no cyanosis or edema and Right groin stable with no evidence of hematoma or bruit  Lab Results:  Basename 02/26/12 0204 02/25/12 1351  WBC 7.5 8.2  HGB 14.0 14.4  PLT 277 317    Basename 02/26/12 0204 02/25/12 1351  NA 131* 129*  K 3.9 4.1  CL 96 93*  CO2 25 26  GLUCOSE 99 111*  BUN 6 5*  CREATININE 0.72 0.61    Basename 02/26/12 0213 02/25/12 2011  TROPONINI 11.64* >20.00*   Hepatic Function Panel  Basename 02/25/12 1351  PROT 7.0  ALBUMIN 4.0  AST 35  ALT 16  ALKPHOS 118*  BILITOT 0.3  BILIDIR --  IBILI --    Basename 02/26/12 0204  CHOL 150   No results found for this basename: PROTIME in the last 72 hours  Imaging: Imaging results have been reviewed and Dg Chest Port 1 View  02/25/2012  *RADIOLOGY REPORT*  Clinical Data: Mid chest pain  PORTABLE CHEST - 1 VIEW  Comparison: 05/02/2009  Findings: Heart size upper normal. Interstitial prominence may be exaggerated by technique.  Mild  left lung base opacity.  The no pleural effusion or pneumothorax.  Multilevel degenerative changes. No acute osseous finding.  IMPRESSION: Mild left lung base opacity is likely scarring or atelectasis.  Heart size upper normal.  Mild interstitial prominence is nonspecific and may be accentuated by portable technique.  Attention at two-view follow-up when the patient can tolerate.   Original Report Authenticated By: Waneta Martins, M.D.     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 Phase I cardiac rehabilitation  LOS: 1 day    Cono Gebhard N 02/26/2012, 12:02 PM

## 2012-02-26 NOTE — Progress Notes (Signed)
UR done. 

## 2012-02-26 NOTE — Care Management Note (Signed)
    Page 1 of 1   02/26/2012     11:43:11 AM   CARE MANAGEMENT NOTE 02/26/2012  Patient:  Rodney [name], Rodney [name]   Account Number:  [id number]  Date Initiated:  02/26/2012  Documentation initiated by:  CRAFT,TERRI  Subjective/Objective Assessment:   61 yo male admitted 02/25/12 with chest pain, unstable angina     Action/Plan:   D/C when medically stable   Anticipated DC Date:  02/26/2012   Anticipated DC Plan:  HOME/SELF CARE      DC Planning Services  CM consult     Discharge Disposition:  HOME/SELF CARE  Per UR Regulation:  Reviewed for med. necessity/level of care/duration of stay  Comments:  02/26/12, Kathi Der RNC-MNN, BSN, [phone], CM received referral.  CM met with pt and spouse.  Pt has Effient card. Instructions given on card and getting medication.

## 2012-02-26 NOTE — Progress Notes (Signed)
CARDIAC REHAB PHASE I   PRE:  Rate/Rhythm: 66SR  BP:  Supine: 137/75  Sitting:   Standing:    SaO2:   MODE:  Ambulation: 600 ft   POST:  Rate/Rhythem: 74SR  BP:  Supine:   Sitting: 137/69  Standing:    SaO2:  [id number] Pt to bathroom  And then walked 600 ft on RA with steady gait. Denied CP. Back to sitting on side of bed after walk. Education completed with pt and wife. Discussed smoking cessation and handouts given. Declined CRP 2 due to work schedule.  Duanne Limerick

## 2012-02-26 NOTE — Progress Notes (Signed)
Site area: right groin  Site Prior to Removal:  Level 0  Pressure Applied For 30 MINUTES    Minutes Beginning at 2100  Manual:   yes  Patient Status During Pull:  Stable and comfortable  Post Pull Groin Site:  Level 0  Post Pull Instructions Given:  yes  Post Pull Pulses Present:  yes  Dressing Applied:  yes  Comments:  Gauze dressing with medipore applied  Patient verbalized understanding of instructions and agreed to call and report any wet or warm feeling at cath site and if coughing or sneezing.

## 2012-02-27 LAB — BASIC METABOLIC PANEL
BUN: 7 mg/dL (ref 6–23)
CO2: 29 mEq/L (ref 19–32)
GFR calc non Af Amer: >90 mL/min (ref >90)
Glucose, Bld: 103 mg/dL — ABNORMAL HIGH (ref 70–99)
Potassium: 4.1 mEq/L (ref 3.5–5.1)

## 2012-02-27 LAB — CBC
Hemoglobin: 13.8 g/dL (ref 13.0–17.0)
MCH: 33.4 pg (ref 26.0–34.0)
MCHC: 35 g/dL (ref 30.0–36.0)
MCV: 95.4 fL (ref 78.0–100.0)

## 2012-02-27 LAB — TROPONIN I: Troponin I: 3.23 ng/mL (ref <0.30)

## 2012-02-27 MED ORDER — PANTOPRAZOLE SODIUM 40 MG PO TBEC: 40.0000 mg | DELAYED_RELEASE_TABLET | Freq: Every day | ORAL | Status: DC

## 2012-02-27 MED ORDER — ATORVASTATIN CALCIUM 80 MG PO TABS: 80.0000 mg | ORAL_TABLET | Freq: Every day | ORAL | Status: AC

## 2012-02-27 MED ORDER — NITROGLYCERIN 0.4 MG SL SUBL: 0.4000 mg | SUBLINGUAL_TABLET | SUBLINGUAL | Status: AC | PRN

## 2012-02-27 MED ORDER — RAMIPRIL 1.25 MG PO CAPS: 1.2500 mg | ORAL_CAPSULE | Freq: Every day | ORAL | Status: DC

## 2012-02-27 MED ORDER — ASPIRIN 81 MG PO TBEC: 81.0000 mg | DELAYED_RELEASE_TABLET | Freq: Every day | ORAL | Status: AC

## 2012-02-27 MED ORDER — METOPROLOL TARTRATE 12.5 MG HALF TABLET: 12.5000 mg | ORAL_TABLET | Freq: Two times a day (BID) | ORAL | Status: DC

## 2012-02-27 MED ORDER — PRASUGREL HCL 10 MG PO TABS: 10.0000 mg | ORAL_TABLET | Freq: Every day | ORAL | Status: DC

## 2012-02-27 NOTE — Discharge Summary (Signed)
  Discharge summary dictated on 02/27/2012 dictation number is [phone]

## 2012-02-27 NOTE — Discharge Summary (Signed)
NAME:  Rodney [name], Rodney [name]                ACCOUNT NO.:  [id number]  MEDICAL RECORD NO.:  [id number]  LOCATION:  6525                         FACILITY:  [facility]  PHYSICIAN:  Neviah Braud N. Sharyn Lull, M.D. DATE OF BIRTH:  04-May-1951  DATE OF ADMISSION:  02/25/2012 DATE OF DISCHARGE:  02/27/2012                              DISCHARGE SUMMARY   ADMITTING DIAGNOSES: 1. Unstable angina, rule out myocardial infarction. 2. Tobacco abuse. 3. Glucose intolerance. 4. Positive family history of coronary artery disease.  DISCHARGE DIAGNOSES: 1. Status post acute non-Q-wave myocardial infarction, status post     percutaneous transluminal coronary angioplasty, stenting to     proximal left circumflex. 2. Status post nonsustained ventricular tachycardia, asymptomatic. 3. Glucose intolerance. 4. Hypercholesteremia. 5. Tobacco abuse. 6. Status post hyponatremia. 7. Positive family history of coronary artery disease.  DISCHARGE HOME MEDICATIONS: 1. Enteric-coated aspirin 81 mg 1 tablet daily. 2. Atorvastatin 80 mg 1 tablet daily. 3. Lopressor 12.5 mg twice daily. 4. Nitrostat 0.4 mg sublingual use as directed. 5. Protonix 40 mg 1 tablet daily. 6. Prasugrel 10 mg 1 tablet daily. 7. Ramipril 1.25 mg 1 capsule daily.  DIET:  Low salt, low cholesterol, 1800 calories, ADA diet.  Post-PTCA stent instructions have been given.  The patient will be scheduled for phase 2 cardiac rehab as outpatient.  Follow up with me in 1 week.  CONDITION AT DISCHARGE:  Stable.  BRIEF HISTORY AND HOSPITAL COURSE:  Mr. Berhe is a 61 year old male with no significant past medical history except for tobacco abuse and positive family history of coronary artery disease.  He came to the ER via EMS, complaining of retrosternal chest pain described as pressure, tightness radiating to left arm and left elbow while driving.  The patient denies any nausea, vomiting, diaphoresis.  Denies palpitation, lightheadedness, or syncope.  He went  to [employer], received four baby aspirin and three sublingual nitroglycerin with relief of chest pain.  The patient states chest pain was grade 1-2/10.  Denies any chest pain at present.  Denies any history of exertional chest pain.  Denies any recent cardiac workup.  The patient states he smokes two packs per day for last 39 years and drinks beer socially.  Denies any drug abuse. States his sister had MI in her 60s.  His father died at young age, cause not known.  PAST MEDICAL HISTORY:  As above plus history of CA of prostate, also had left eye cataract surgery in the past, had hemorrhoid surgery in the past.  He had also radical prostatectomy in the past, had wrist surgery in the past.  PHYSICAL EXAMINATION:  GENERAL:  He was alert, awake, and oriented x3. VITAL SIGNS:  Blood pressure was 130/76, pulse was 72.  He was afebrile. HEENT:  Conjunctiva was pink. NECK:  Supple.  No JVD.  No bruit. LUNGS:  He had decreased breath sounds at bases. CARDIOVASCULAR:  S1, S2 was normal.  There was soft systolic murmur.  No S3 gallop was noted. ABDOMEN:  Soft.  Bowel sounds were present.  Nontender. EXTREMITIES:  There was no clubbing, cyanosis, or edema.  LABORATORY DATA:  His sodium was 128, potassium 4.3, BUN 4, creatinine 0.67,  glucose was 162.  Repeat blood sugar was 111.  Repeat fasting blood sugar yesterday was 99, today is 103.  Today, electrolytes; sodium 132, potassium 4.1, BUN 7, creatinine 0.76.  Troponin-I first set point of care was less than 0.30.  Repeat troponin-I in the ER was 4.04. Repeat troponin-I next set was more than 20.  Next set yesterday 11.64, today 3.23, which is trending down.  Cholesterol was 150, LDL 66, HDL 66, triglycerides 88.  Hemoglobin was 14.1, hematocrit 39.2, white count of 10.5.  Today, hemoglobin is 13.8, hematocrit 39.4, white count of 6.8, platelet count is 278,000.  His initial EKG showed normal sinus rhythm with minimal ST elevation in  inferior leads less than 0.5 mm with no reciprocal changes.  Repeat EKG showed normal sinus rhythm with nonspecific T-wave changes.  BRIEF HOSPITAL COURSE:  The patient was admitted to telemetry unit.  The patient ruled in for non-Q-wave MI due to elevated cardiac enzymes and subsequently underwent left cardiac cath with selective left and right coronary angiography and PTCA, stenting to proximal left circumflex as per procedure report.  The patient tolerated the procedure well.  There were no complications.  Postprocedure, the patient did not have any episodes of chest pain during the hospital stay.  His groin is stable with no evidence of hematoma or bruit.  Phase 1 cardiac rehab was called.  The patient has been ambulating in hallway without any problems.  The patient had one episode of nonsustained VT, which was asymptomatic.  The patient's cardiac enzymes are trending down and there were no further episodes of VT.  The patient has been discussed at length regarding lifestyle modification and smoking cessation to which he agrees.  The patient will be scheduled for phase 2 cardiac rehab as outpatient and will be followed up in my office in 1 week.     Eduardo Osier. Sharyn Lull, M.D.     [facility]/MED[facility]  D:  02/27/2012  T:  02/27/2012  Job:  [id number]

## 2012-02-27 NOTE — Progress Notes (Signed)
CARDIAC REHAB PHASE I   PRE:  Rate/Rhythm: 61SR  BP:  Supine: 131/83  Sitting:   Standing:    SaO2:   MODE:  Ambulation: 700 ft   POST:  Rate/Rhythem: 78SR  BP:  Supine: 134/70  Sitting:   Standing:    SaO2:  0740-0800 Pt walked 700 ft on RA with handheld asst. Gait steady. Tolerated well. No ectopy seen on monitor during walk. Denied CP. No questions about ed yesterday. Back to bed after walk.  Duanne Limerick

## 2012-05-14 ENCOUNTER — Other Ambulatory Visit: Payer: Self-pay

## 2012-12-23 ENCOUNTER — Other Ambulatory Visit: Payer: Self-pay | Admitting: Family Medicine

## 2012-12-23 ENCOUNTER — Ambulatory Visit
Admission: RE | Admit: 2012-12-23 | Discharge: 2012-12-23 | Disposition: A | Discharge status: Home / Self Care | Payer: BC Managed Care – PPO | Source: Ambulatory Visit | Attending: Family Medicine | Admitting: Family Medicine

## 2012-12-23 DIAGNOSIS — E871 Hypo-osmolality and hyponatremia: Secondary | ICD-10-CM

## 2013-06-01 ENCOUNTER — Ambulatory Visit
Admission: RE | Admit: 2013-06-01 | Discharge: 2013-06-01 | Disposition: A | Discharge status: Home / Self Care | Payer: BC Managed Care – PPO | Source: Ambulatory Visit | Attending: Family Medicine | Admitting: Family Medicine

## 2013-06-01 ENCOUNTER — Other Ambulatory Visit: Payer: Self-pay | Admitting: Family Medicine

## 2013-06-01 DIAGNOSIS — W19XXXA Unspecified fall, initial encounter: Secondary | ICD-10-CM

## 2013-06-01 DIAGNOSIS — R0781 Pleurodynia: Secondary | ICD-10-CM

## 2014-05-12 ENCOUNTER — Encounter (HOSPITAL_COMMUNITY): Payer: Self-pay | Admitting: Cardiology

## 2014-10-05 ENCOUNTER — Other Ambulatory Visit: Payer: Self-pay | Admitting: Radiation Oncology

## 2014-10-11 ENCOUNTER — Other Ambulatory Visit: Payer: Self-pay | Admitting: Radiation Oncology

## 2015-02-09 ENCOUNTER — Encounter (HOSPITAL_BASED_OUTPATIENT_CLINIC_OR_DEPARTMENT_OTHER): Payer: Self-pay | Admitting: *Deleted

## 2015-02-09 ENCOUNTER — Observation Stay (HOSPITAL_BASED_OUTPATIENT_CLINIC_OR_DEPARTMENT_OTHER)
Admission: EM | Admit: 2015-02-09 | Discharge: 2015-02-10 | DRG: 300 | Disposition: A | Discharge status: Home / Self Care | Payer: 59 | Attending: Vascular Surgery | Admitting: Vascular Surgery

## 2015-02-09 ENCOUNTER — Emergency Department (HOSPITAL_COMMUNITY): Payer: 59

## 2015-02-09 ENCOUNTER — Inpatient Hospital Stay (HOSPITAL_COMMUNITY): Payer: 59

## 2015-02-09 ENCOUNTER — Emergency Department (HOSPITAL_BASED_OUTPATIENT_CLINIC_OR_DEPARTMENT_OTHER): Payer: 59

## 2015-02-09 DIAGNOSIS — M79605 Pain in left leg: Secondary | ICD-10-CM

## 2015-02-09 DIAGNOSIS — R202 Paresthesia of skin: Secondary | ICD-10-CM | POA: Diagnosis not present

## 2015-02-09 DIAGNOSIS — Z7982 Long term (current) use of aspirin: Secondary | ICD-10-CM | POA: Diagnosis not present

## 2015-02-09 DIAGNOSIS — I2510 Atherosclerotic heart disease of native coronary artery without angina pectoris: Secondary | ICD-10-CM | POA: Diagnosis present

## 2015-02-09 DIAGNOSIS — I739 Peripheral vascular disease, unspecified: Principal | ICD-10-CM | POA: Diagnosis present

## 2015-02-09 DIAGNOSIS — Z8049 Family history of malignant neoplasm of other genital organs: Secondary | ICD-10-CM

## 2015-02-09 DIAGNOSIS — E871 Hypo-osmolality and hyponatremia: Secondary | ICD-10-CM | POA: Diagnosis not present

## 2015-02-09 DIAGNOSIS — R21 Rash and other nonspecific skin eruption: Secondary | ICD-10-CM | POA: Diagnosis present

## 2015-02-09 DIAGNOSIS — I10 Essential (primary) hypertension: Secondary | ICD-10-CM | POA: Diagnosis present

## 2015-02-09 DIAGNOSIS — I998 Other disorder of circulatory system: Secondary | ICD-10-CM | POA: Diagnosis not present

## 2015-02-09 DIAGNOSIS — Z923 Personal history of irradiation: Secondary | ICD-10-CM

## 2015-02-09 DIAGNOSIS — F17210 Nicotine dependence, cigarettes, uncomplicated: Secondary | ICD-10-CM | POA: Diagnosis present

## 2015-02-09 DIAGNOSIS — I70213 Atherosclerosis of native arteries of extremities with intermittent claudication, bilateral legs: Secondary | ICD-10-CM

## 2015-02-09 DIAGNOSIS — E780 Pure hypercholesterolemia: Secondary | ICD-10-CM | POA: Diagnosis present

## 2015-02-09 DIAGNOSIS — Z79899 Other long term (current) drug therapy: Secondary | ICD-10-CM

## 2015-02-09 DIAGNOSIS — Z8546 Personal history of malignant neoplasm of prostate: Secondary | ICD-10-CM | POA: Diagnosis not present

## 2015-02-09 DIAGNOSIS — I252 Old myocardial infarction: Secondary | ICD-10-CM

## 2015-02-09 DIAGNOSIS — Z803 Family history of malignant neoplasm of breast: Secondary | ICD-10-CM

## 2015-02-09 DIAGNOSIS — R011 Cardiac murmur, unspecified: Secondary | ICD-10-CM | POA: Diagnosis not present

## 2015-02-09 LAB — CBC WITH DIFFERENTIAL/PLATELET
BASOS ABS: 0.1 10*3/uL (ref 0.0–0.1)
BASOS PCT: 0 % (ref 0–1)
EOS PCT: 1 % (ref 0–5)
Eosinophils Absolute: 0.1 10*3/uL (ref 0.0–0.7)
HCT: 38.3 % — ABNORMAL LOW (ref 39.0–52.0)
Hemoglobin: 13.2 g/dL (ref 13.0–17.0)
Lymphocytes Relative: 4 % — ABNORMAL LOW (ref 12–46)
Lymphs Abs: 0.5 10*3/uL — ABNORMAL LOW (ref 0.7–4.0)
MCH: 32.6 pg (ref 26.0–34.0)
MCHC: 34.5 g/dL (ref 30.0–36.0)
MCV: 94.6 fL (ref 78.0–100.0)
MONO ABS: 1 10*3/uL (ref 0.1–1.0)
MONOS PCT: 9 % (ref 3–12)
Neutro Abs: 9.9 10*3/uL — ABNORMAL HIGH (ref 1.7–7.7)
Neutrophils Relative %: 86 % — ABNORMAL HIGH (ref 43–77)
PLATELETS: 244 10*3/uL (ref 150–400)
RBC: 4.05 MIL/uL — ABNORMAL LOW (ref 4.22–5.81)
RDW: 12.6 % (ref 11.5–15.5)
WBC: 11.6 10*3/uL — ABNORMAL HIGH (ref 4.0–10.5)

## 2015-02-09 LAB — COMPREHENSIVE METABOLIC PANEL
ALT: 39 U/L (ref 17–63)
AST: 58 U/L — ABNORMAL HIGH (ref 15–41)
Albumin: 3.3 g/dL — ABNORMAL LOW (ref 3.5–5.0)
Alkaline Phosphatase: 131 U/L — ABNORMAL HIGH (ref 38–126)
Anion gap: 10 (ref 5–15)
BILIRUBIN TOTAL: 0.9 mg/dL (ref 0.3–1.2)
BUN: 5 mg/dL — AB (ref 6–20)
CHLORIDE: 94 mmol/L — AB (ref 101–111)
CO2: 25 mmol/L (ref 22–32)
CREATININE: 0.93 mg/dL (ref 0.61–1.24)
Calcium: 8.7 mg/dL — ABNORMAL LOW (ref 8.9–10.3)
Glucose, Bld: 114 mg/dL — ABNORMAL HIGH (ref 65–99)
POTASSIUM: 4.8 mmol/L (ref 3.5–5.1)
Sodium: 129 mmol/L — ABNORMAL LOW (ref 135–145)
TOTAL PROTEIN: 6.3 g/dL — AB (ref 6.5–8.1)

## 2015-02-09 LAB — HEPARIN LEVEL (UNFRACTIONATED): Heparin Unfractionated: 0.6 [IU]/mL (ref 0.30–0.70)

## 2015-02-09 LAB — PROTIME-INR
INR: 0.91 (ref 0.00–1.49)
Prothrombin Time: 12.5 seconds (ref 11.6–15.2)

## 2015-02-09 LAB — I-STAT CG4 LACTIC ACID, ED: LACTIC ACID, VENOUS: 0.91 mmol/L (ref 0.5–2.0)

## 2015-02-09 MED ORDER — METOPROLOL TARTRATE 12.5 MG HALF TABLET
12.5000 mg | ORAL_TABLET | Freq: Two times a day (BID) | ORAL | Status: DC
  Filled 2015-02-09: qty 1

## 2015-02-09 MED ORDER — NITROGLYCERIN 0.4 MG SL SUBL: 0.4000 mg | SUBLINGUAL_TABLET | SUBLINGUAL | Status: DC | PRN

## 2015-02-09 MED ORDER — HEPARIN BOLUS VIA INFUSION
3000.0000 [IU] | Freq: Once | INTRAVENOUS | Status: DC
  Filled 2015-02-09: qty 3000

## 2015-02-09 MED ORDER — HYDRALAZINE HCL 20 MG/ML IJ SOLN: 5.0000 mg | INTRAMUSCULAR | Status: DC | PRN

## 2015-02-09 MED ORDER — HYDROMORPHONE HCL 1 MG/ML IJ SOLN: 0.5000 mg | INTRAMUSCULAR | Status: DC | PRN

## 2015-02-09 MED ORDER — GUAIFENESIN-DM 100-10 MG/5ML PO SYRP: 15.0000 mL | ORAL_SOLUTION | ORAL | Status: DC | PRN

## 2015-02-09 MED ORDER — ATORVASTATIN CALCIUM 80 MG PO TABS
80.0000 mg | ORAL_TABLET | Freq: Every day | ORAL | Status: DC
Start: 2015-02-10 — End: 2015-02-10

## 2015-02-09 MED ORDER — ALUM & MAG HYDROXIDE-SIMETH 200-200-20 MG/5ML PO SUSP: 15.0000 mL | ORAL | Status: DC | PRN

## 2015-02-09 MED ORDER — ONDANSETRON HCL 4 MG/2ML IJ SOLN: 4.0000 mg | Freq: Four times a day (QID) | INTRAMUSCULAR | Status: DC | PRN

## 2015-02-09 MED ORDER — POTASSIUM CHLORIDE CRYS ER 20 MEQ PO TBCR: 20.0000 meq | EXTENDED_RELEASE_TABLET | Freq: Once | ORAL | Status: DC

## 2015-02-09 MED ORDER — IOHEXOL 350 MG/ML SOLN
100.0000 mL | Freq: Once | INTRAVENOUS | Status: AC | PRN
  Administered 2015-02-09: 100 mL via INTRAVENOUS

## 2015-02-09 MED ORDER — HEPARIN BOLUS VIA INFUSION
60.0000 [IU]/kg | Freq: Once | INTRAVENOUS | Status: AC
  Administered 2015-02-09: 3486 [IU] via INTRAVENOUS

## 2015-02-09 MED ORDER — ACETAMINOPHEN 325 MG PO TABS: 325.0000 mg | ORAL_TABLET | ORAL | Status: DC | PRN

## 2015-02-09 MED ORDER — PHENOL 1.4 % MT LIQD: 1.0000 | OROMUCOSAL | Status: DC | PRN

## 2015-02-09 MED ORDER — ACETAMINOPHEN 650 MG RE SUPP: 325.0000 mg | RECTAL | Status: DC | PRN

## 2015-02-09 MED ORDER — HEPARIN (PORCINE) IN NACL 100-0.45 UNIT/ML-% IJ SOLN
1050.0000 [IU]/h | INTRAMUSCULAR | Status: AC
Start: 2015-02-09 — End: 2015-02-10
  Administered 2015-02-09: 1050 [IU]/h via INTRAVENOUS
  Filled 2015-02-09: qty 250

## 2015-02-09 MED ORDER — RAMIPRIL 1.25 MG PO CAPS
1.2500 mg | ORAL_CAPSULE | Freq: Every day | ORAL | Status: DC
  Filled 2015-02-09 (×2): qty 1

## 2015-02-09 MED ORDER — ASPIRIN EC 81 MG PO TBEC: 81.0000 mg | DELAYED_RELEASE_TABLET | Freq: Every day | ORAL | Status: DC

## 2015-02-09 MED ORDER — PANTOPRAZOLE SODIUM 40 MG PO TBEC
40.0000 mg | DELAYED_RELEASE_TABLET | Freq: Every day | ORAL | Status: DC
  Administered 2015-02-10: 40 mg via ORAL
  Filled 2015-02-09: qty 1

## 2015-02-09 MED ORDER — LABETALOL HCL 5 MG/ML IV SOLN
10.0000 mg | INTRAVENOUS | Status: DC | PRN
  Filled 2015-02-09: qty 4

## 2015-02-09 MED ORDER — HEPARIN (PORCINE) IN NACL 100-0.45 UNIT/ML-% IJ SOLN
18.0000 [IU]/kg/h | Freq: Once | INTRAMUSCULAR | Status: AC
  Administered 2015-02-09: 25000 [IU] via INTRAVENOUS

## 2015-02-09 MED ORDER — OXYCODONE-ACETAMINOPHEN 5-325 MG PO TABS: 1.0000 | ORAL_TABLET | ORAL | Status: DC | PRN

## 2015-02-09 MED ORDER — HEPARIN (PORCINE) IN NACL 100-0.45 UNIT/ML-% IJ SOLN
INTRAMUSCULAR | Status: AC
  Administered 2015-02-09: 25000 [IU] via INTRAVENOUS
  Filled 2015-02-09: qty 250

## 2015-02-09 MED ORDER — METOPROLOL TARTRATE 1 MG/ML IV SOLN
2.0000 mg | INTRAVENOUS | Status: DC | PRN
  Filled 2015-02-09: qty 5

## 2015-02-09 NOTE — ED Notes (Signed)
EMS is transporting patient to ED at [facility] not [facility]

## 2015-02-09 NOTE — ED Notes (Signed)
Patient transported to CT 

## 2015-02-09 NOTE — H&P (Signed)
[name]  Patient name: Rodney [name] MRN: [id number] DOB: 1951/02/21 Sex: male  REASON FOR ADMISSION: Ischemic left lower extremity  HPI: Rodney [name] is a 64 y.o. male who noted the gradual onset of paresthesias in his left leg and proximally 3 PM today. This started in his left foot and gradually progressed to involve the entire leg. He denies significant pain. He states that his symptoms have improved over the last 2 hours. He was seen in [facility] and then transferred here.  Prior to this event, he did admit to some bilateral calf claudication when he was walking uphill. The pain was brought on by ambulation and relieved with rest. He denies any history of rest pain or history of nonhealing ulcers.  He denies any history of atrial fibrillation or recent myocardial infarction. He did have a myocardial infarction in the remote past.  His risk factors for peripheral vascular disease include a history of tobacco use. He smoked 2 packs per day for over 35 years but quit 7 years ago. In addition, he has a history of hypertension and hypercholesterolemia. He denies any family history of premature cardiovascular disease or history of diabetes.  Past Medical History  Diagnosis Date  . Prostate cancer 05/03/2009    Prostatectomy/Adenocrcinoma  . Heart murmur     history  . Cataract     left eye surgery/   . Radiation 07/02/11-08/22/11    Prostate fossa 68.4 gray 38 fractions    Family History  Problem Relation Age of Onset  . Uterine cancer Mother 42    going to [employer]  . Breast cancer Sister     SOCIAL HISTORY: Social History  Substance Use Topics  . Smoking status: Current Every Day Smoker -- 2.00 packs/day for 36 years    Types: Cigarettes  . Smokeless tobacco: Never Used  . Alcohol Use: 3.6 oz/week    6 Cans of beer per week     Comment: 6-7  cans drinks daily 12 oz   He lost his wife in December 2015.  Allergies  Allergen Reactions    . Other Rash    polyestor and metals except titanium    No current facility-administered medications for this encounter.   Current Outpatient Prescriptions  Medication Sig Dispense Refill  . aspirin EC 81 MG EC tablet Take 1 tablet (81 mg total) by mouth daily. 30 tablet 3  . atorvastatin (LIPITOR) 80 MG tablet Take 1 tablet (80 mg total) by mouth daily at 6 PM. 30 tablet 3  . metoprolol tartrate (LOPRESSOR) 12.5 mg TABS Take 0.5 tablets (12.5 mg total) by mouth 2 (two) times daily. 30 tablet 3  . nitroGLYCERIN (NITROSTAT) 0.4 MG SL tablet Place 1 tablet (0.4 mg total) under the tongue every 5 (five) minutes x 3 doses as needed for chest pain. 25 tablet 3  . pantoprazole (PROTONIX) 40 MG tablet Take 1 tablet (40 mg total) by mouth daily with breakfast. 30 tablet 3  . prasugrel (EFFIENT) 10 MG TABS Take 1 tablet (10 mg total) by mouth daily. 30 tablet 11  . ramipril (ALTACE) 1.25 MG capsule Take 1 capsule (1.25 mg total) by mouth daily. 30 capsule 3    REVIEW OF SYSTEMS: Valu.Nieves ] denotes positive finding; [  ] denotes negative finding CARDIOVASCULAR:  [ ]  chest pain   [ ]  chest pressure   [ ]  palpitations   [ ]  orthopnea   [ ]  dyspnea on exertion   [  X ] claudication   [ ]  rest pain   [ ]  DVT   [ ]  phlebitis PULMONARY:   [ ]  productive cough   [ ]  asthma   [ ]  wheezing NEUROLOGIC:   [ ]  weakness  Valu.Nieves ] paresthesias  [ ]  aphasia  [ ]  amaurosis  [ ]  dizziness HEMATOLOGIC:   [ ]  bleeding problems   [ ]  clotting disorders MUSCULOSKELETAL:  Valu.Nieves ] joint pain - left UE  [ ]  joint swelling [ ]  leg swelling GASTROINTESTINAL: [ ]   blood in stool  [ ]   hematemesis GENITOURINARY:  [ ]   dysuria  [ ]   hematuria PSYCHIATRIC:  [ ]  history of major depression INTEGUMENTARY:  [ ]  rashes  [ ]  ulcers CONSTITUTIONAL:  [ ]  fever   [ ]  chills  PHYSICAL EXAM: Filed Vitals:   02/09/15 1817 02/09/15 1830 02/09/15 1834 02/09/15 1845  BP: 139/62 119/65 119/65 149/72  Pulse: 75 69 76 67  Temp: 98.2 F (36.8 C)      TempSrc: Oral     Resp: 14  18   Height:      Weight:      SpO2: 100% 99% 99% 99%   Body mass index is 19.74 kg/(m^2). GENERAL: The patient is a well-nourished male, in no acute distress. The vital signs are documented above. CARDIAC: There is a regular rate and rhythm.  VASCULAR: I do not detect carotid bruits. On the left side, which is the symptomatic side, he does have a palpable femoral pulse which is slightly diminished. I cannot palpate a popliteal, dorsalis pedis, or posterior tibial pulse. I am unable to obtain Doppler flow in the left foot. The left foot however is pink and appears adequately perfused. Motor and sensory function is intact.  On the right side, he has a palpable femoral pulse. I cannot palpate a popliteal, dorsalis pedis, or posterior tibial pulse. He has a biphasic dorsalis pedis signal on the right. I cannot obtain a posterior tibial or peroneal signal on the right. Both feet are slightly cool. There is no significant lower extremity swelling. PULMONARY: There is good air exchange bilaterally without wheezing or rales. ABDOMEN: Soft and non-tender with normal pitched bowel sounds. I do not palpate an abdominal aortic aneurysm. MUSCULOSKELETAL: There are no major deformities. NEUROLOGIC: No focal weakness or paresthesias are detected. SKIN: he does have a rash with some punctate lesions involving his chest, back, and thighs. PSYCHIATRIC: The patient has a normal affect.  DATA:  Lab Results  Component Value Date   WBC 11.6* 02/09/2015   HGB 13.2 02/09/2015   HCT 38.3* 02/09/2015   MCV 94.6 02/09/2015   PLT 244 02/09/2015   Lab Results  Component Value Date   NA 129* 02/09/2015   K 4.8 02/09/2015   CL 94* 02/09/2015   CO2 25 02/09/2015   Lab Results  Component Value Date   CREATININE 0.93 02/09/2015   Lab Results  Component Value Date   INR 0.91 02/09/2015   INR 0.88 02/25/2012   Lab Results  Component Value Date   HGBA1C 5.4 02/25/2012    MEDICAL ISSUES:  LEFT LOWER EXTREMITY ISCHEMIA: The patient developed the fairly sudden onset of left lower extremity paresthesias today. Given that there is no Doppler flow in the left foot, I am concerned about acute ischemia. He has no good reason to have embolized. There is no history of atrial fibrillation or recent myocardial infarction. He does have multiple risk factors for vascular disease  including a strong smoking history and likely has underlying disease which could have progressed. The differential diagnosis would also include proximal disease which embolized. Regardless, currently the foot is adequately perfused with good color and motor and sensory function is intact. I have recommended an arteriogram which is scheduled for tomorrow. We will leave him on heparin tonight.  I do not think that the history is consistent with a stroke.   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 He is asymptomatic.  Deitra Mayo [facility] [place]: [phone]

## 2015-02-09 NOTE — ED Notes (Signed)
[facility] called -- they are transferring the patient to ED at [facility]

## 2015-02-09 NOTE — ED Provider Notes (Signed)
Pt transferred from [facility] for evaluation of lower limb ischemia.  Pt evaluated by Dr. Doren Custard in the ED.  Dr. Doren Custard will admit for arteriogram in am.    Quintella Reichert, MD 02/09/15 [phone]

## 2015-02-09 NOTE — ED Notes (Signed)
An hour ago after taking a shower his left leg felt numb. Foot feels like it is asleep.

## 2015-02-09 NOTE — ED Notes (Signed)
Pt arrived via [facility] from [facility], report taken, left foot mildly cool, cap refill 3-4 secs (improved from reported 10 secs), Dr Doren Custard paged and to bedside

## 2015-02-09 NOTE — Progress Notes (Addendum)
ANTICOAGULATION CONSULT NOTE - Initial Consult  Pharmacy Consult for Heparin Indication: Ischemic LLE  Allergies  Allergen Reactions  . Other Rash    polyestor and metals except titanium    Patient Measurements: Height: 5' 7.5" (171.5 cm) Weight: 128 lb (58.06 kg) IBW/kg (Calculated) : 67.25 Heparin Dosing Weight: 58 kg  Vital Signs: Temp: 98.2 F (36.8 C) (09/08 1817) Temp Source: Oral (09/08 1817) BP: 149/72 mmHg (09/08 1845) Pulse Rate: 67 (09/08 1845)  Labs:  Recent Labs  02/09/15 1700  HGB 13.2  HCT 38.3*  PLT 244  LABPROT 12.5  INR 0.91  CREATININE 0.93    Estimated Creatinine Clearance: 67.7 mL/min (by C-G formula based on Cr of 0.93).   Medical History: Past Medical History  Diagnosis Date  . Prostate cancer 05/03/2009    Prostatectomy/Adenocrcinoma  . Heart murmur     history  . Cataract     left eye surgery/   . Radiation 07/02/11-08/22/11    Prostate fossa 68.4 gray 38 fractions    Medications:  Prescriptions prior to admission  Medication Sig Dispense Refill Last Dose  . aspirin EC 81 MG EC tablet Take 1 tablet (81 mg total) by mouth daily. 30 tablet 3   . atorvastatin (LIPITOR) 80 MG tablet Take 1 tablet (80 mg total) by mouth daily at 6 PM. 30 tablet 3   . metoprolol tartrate (LOPRESSOR) 12.5 mg TABS Take 0.5 tablets (12.5 mg total) by mouth 2 (two) times daily. 30 tablet 3   . nitroGLYCERIN (NITROSTAT) 0.4 MG SL tablet Place 1 tablet (0.4 mg total) under the tongue every 5 (five) minutes x 3 doses as needed for chest pain. 25 tablet 3   . ramipril (ALTACE) 1.25 MG capsule Take 1 capsule (1.25 mg total) by mouth daily. 30 capsule 3    Scheduled:  . aspirin EC  81 mg Oral Daily  . [START ON 02/10/2015] atorvastatin  80 mg Oral q1800  . metoprolol tartrate  12.5 mg Oral BID  . pantoprazole  40 mg Oral Daily  . potassium chloride  20-40 mEq Oral Once  . ramipril  1.25 mg Oral Daily   Infusions:    Assessment: 64yo male presents with  gradual onset of paesthesias in L leg, which eventually progressed to entire leg. Pharmacy is consulted to dose heparin for ischemic LLE. CBC is wnl, sCr 0.93.  Pt was given heparin 3486 units bolus and was started on 1050 units/hr from [facility]. Will resume current rate and check 6h HL.  Goal of Therapy:  Heparin level 0.3-0.7 units/ml Monitor platelets by anticoagulation protocol: Yes   Plan:  Heparin 1050 units/hr Check anti-Xa level in 6 hours and daily while on heparin Continue to monitor H&H and platelets  Andrey Cota. Diona Foley, PharmD Clinical Pharmacist Pager [phone] 02/09/2015,7:57 PM

## 2015-02-09 NOTE — ED Notes (Signed)
Report called to  Melissa, charge RN at [place] ED. 

## 2015-02-09 NOTE — ED Provider Notes (Signed)
CSN: [id number]     Arrival date & time 02/09/15  1621 History   First MD Initiated Contact with Patient 02/09/15 1630     Chief Complaint  Patient presents with  . Numbness     (Consider location/radiation/quality/duration/timing/severity/associated sxs/prior Treatment) Patient is a 64 y.o. male presenting with leg pain.  Leg Pain Location:  Leg Time since incident:  2 hours Leg location:  L lower leg Pain details:    Quality:  Aching and tingling   Radiates to:  Does not radiate   Severity:  Moderate   Onset quality:  Sudden   Duration:  2 hours   Timing:  Constant Chronicity:  New Prior injury to area:  No Relieved by:  Nothing Worsened by:  Bearing weight, flexion, exercise and extension Associated symptoms: swelling (bil le)   Associated symptoms: no back pain, no decreased ROM, no fever, no muscle weakness, no neck pain and no stiffness     Past Medical History  Diagnosis Date  . Prostate cancer 05/03/2009    Prostatectomy/Adenocrcinoma  . Heart murmur     history  . Cataract     left eye surgery/   . Radiation 07/02/11-08/22/11    Prostate fossa 68.4 gray 38 fractions   Past Surgical History  Procedure Laterality Date  . Left eye surgery    . Hemorrhoid surgery    . Robot assisted laparoscopic radical prostatectomy  05/03/2009  . Wrist surgery      left  . Left heart catheterization with coronary angiogram N/A 02/25/2012    Procedure: LEFT HEART CATHETERIZATION WITH CORONARY ANGIOGRAM;  Surgeon: Clent Demark, MD;  Location: [facility] CATH LAB;  Service: Cardiovascular;  Laterality: N/A;   Family History  Problem Relation Age of Onset  . Uterine cancer Mother 110    going to [employer]  . Breast cancer Sister    Social History  Substance Use Topics  . Smoking status: Current Every Day Smoker -- 2.00 packs/day for 36 years    Types: Cigarettes  . Smokeless tobacco: Never Used  . Alcohol Use: 3.6 oz/week    6 Cans of beer per week     Comment: 6-7  cans drinks  daily 12 oz    Review of Systems  Constitutional: Negative for fever.  Musculoskeletal: Negative for back pain, stiffness and neck pain.  All other systems reviewed and are negative.     Allergies  Other  Home Medications   Prior to Admission medications   Medication Sig Start Date End Date Taking? Authorizing Provider  aspirin EC 81 MG EC tablet Take 1 tablet (81 mg total) by mouth daily. 02/27/12   Charolette Forward, MD  atorvastatin (LIPITOR) 80 MG tablet Take 1 tablet (80 mg total) by mouth daily at 6 PM. 02/27/12   Charolette Forward, MD  metoprolol tartrate (LOPRESSOR) 12.5 mg TABS Take 0.5 tablets (12.5 mg total) by mouth 2 (two) times daily. 02/27/12   Charolette Forward, MD  nitroGLYCERIN (NITROSTAT) 0.4 MG SL tablet Place 1 tablet (0.4 mg total) under the tongue every 5 (five) minutes x 3 doses as needed for chest pain. 02/27/12   Charolette Forward, MD  pantoprazole (PROTONIX) 40 MG tablet Take 1 tablet (40 mg total) by mouth daily with breakfast. 02/27/12   Charolette Forward, MD  prasugrel (EFFIENT) 10 MG TABS Take 1 tablet (10 mg total) by mouth daily. 02/27/12   Charolette Forward, MD  ramipril (ALTACE) 1.25 MG capsule Take 1 capsule (1.25 mg total) by mouth daily.  02/27/12   Charolette Forward, MD   BP 149/65 mmHg  Pulse 74  Temp(Src) 97.9 F (36.6 C) (Oral)  Resp 18  Ht 5' 7.5" (1.715 m)  Wt 128 lb (58.06 kg)  BMI 19.74 kg/m2  SpO2 100% Physical Exam  Constitutional: He is oriented to person, place, and time. He appears well-developed and well-nourished.  HENT:  Head: Normocephalic and atraumatic.  Eyes: Conjunctivae and EOM are normal.  Neck: Normal range of motion. Neck supple.  Cardiovascular: Normal rate, regular rhythm and normal heart sounds.   Pulses:      Dorsalis pedis pulses are 0 on the right side, and 0 on the left side.       Posterior tibial pulses are 0 on the right side, and 0 on the left side.  dopplerable DP on R, none on L   Pulmonary/Chest: Effort normal and breath sounds  normal. No respiratory distress.  Abdominal: He exhibits no distension. There is no tenderness. There is no rebound and no guarding.  Musculoskeletal: Normal range of motion.  bil 2+ pitting edema of ankles  Neurological: He is alert and oriented to person, place, and time.  Skin: Skin is warm and dry.  Vitals reviewed.   ED Course  Procedures (including critical care time) Labs Review Labs Reviewed  CBC WITH DIFFERENTIAL/PLATELET - Abnormal; Notable for the following:    WBC 11.6 (*)    RBC 4.05 (*)    HCT 38.3 (*)    Neutrophils Relative % 86 (*)    Neutro Abs 9.9 (*)    Lymphocytes Relative 4 (*)    Lymphs Abs 0.5 (*)    All other components within normal limits  COMPREHENSIVE METABOLIC PANEL - Abnormal; Notable for the following:    Sodium 129 (*)    Chloride 94 (*)    Glucose, Bld 114 (*)    BUN 5 (*)    Calcium 8.7 (*)    Total Protein 6.3 (*)    Albumin 3.3 (*)    AST 58 (*)    Alkaline Phosphatase 131 (*)    All other components within normal limits  PROTIME-INR  I-STAT CG4 LACTIC ACID, ED    Imaging Review No results found. I have personally reviewed and evaluated these images and lab results as part of my medical decision-making.   EKG Interpretation None      MDM   Final diagnoses:  Left leg pain    64 y.o. male with pertinent PMH of CAD presents with L leg pain.  Spontaneous onset on getting out of shower.  Also endorses 2 weeks of generalized rash.  Rash consists of punctate ulcerations throughout body concerning for vasculitic rash.  Unable to find palpable pulses in bil LE.  Able to doppler R DP, no R PT, and no dopplerable pulses on L, including popliteal.  Also L leg cooler than R.  Concern for acute arterial occlusion based on spontaneous onset of symptoms and vascular history.  Consulted vascular who recommended transfer to [facility] ED.  This was arranged emergently.  Vascular also requested I notify the hospitalist, as well as the ED, both of which  were notified.  Transferred after heparin.  I have reviewed all laboratory and imaging studies if ordered as above  1. Left leg pain         Debby Freiberg, MD 02/09/15 [phone]

## 2015-02-09 NOTE — ED Notes (Signed)
[facility] has been cancelled per Nurse Caryl Pina

## 2015-02-09 NOTE — ED Notes (Signed)
Vascular at bedside

## 2015-02-09 NOTE — ED Notes (Signed)
[facility] here at this time.

## 2015-02-10 ENCOUNTER — Encounter (HOSPITAL_COMMUNITY)
Admission: EM | Disposition: A | Discharge status: Home / Self Care | Payer: Self-pay | Source: Home / Self Care | Attending: Emergency Medicine

## 2015-02-10 DIAGNOSIS — I70213 Atherosclerosis of native arteries of extremities with intermittent claudication, bilateral legs: Secondary | ICD-10-CM | POA: Diagnosis not present

## 2015-02-10 HISTORY — PX: PERIPHERAL VASCULAR CATHETERIZATION: SHX172C

## 2015-02-10 LAB — CBC
HEMATOCRIT: 32.6 % — AB (ref 39.0–52.0)
HEMOGLOBIN: 11.3 g/dL — AB (ref 13.0–17.0)
MCH: 32.9 pg (ref 26.0–34.0)
MCHC: 34.7 g/dL (ref 30.0–36.0)
MCV: 95 fL (ref 78.0–100.0)
Platelets: 191 10*3/uL (ref 150–400)
RBC: 3.43 MIL/uL — AB (ref 4.22–5.81)
RDW: 13.3 % (ref 11.5–15.5)
WBC: 7.7 10*3/uL (ref 4.0–10.5)

## 2015-02-10 LAB — URINALYSIS, ROUTINE W REFLEX MICROSCOPIC
Bilirubin Urine: NEGATIVE
HGB URINE DIPSTICK: NEGATIVE
Ketones, ur: 15 mg/dL — AB
LEUKOCYTES UA: NEGATIVE
Nitrite: NEGATIVE
PH: 6 (ref 5.0–8.0)
Protein, ur: NEGATIVE mg/dL
Specific Gravity, Urine: 1.025 (ref 1.005–1.030)
Urobilinogen, UA: 1 mg/dL (ref 0.0–1.0)

## 2015-02-10 LAB — URINE MICROSCOPIC-ADD ON

## 2015-02-10 LAB — HEPARIN LEVEL (UNFRACTIONATED): Heparin Unfractionated: 0.8 IU/mL — ABNORMAL HIGH (ref 0.30–0.70)

## 2015-02-10 SURGERY — ABDOMINAL AORTOGRAM
Anesthesia: LOCAL

## 2015-02-10 MED ORDER — SODIUM CHLORIDE 0.9 % IV SOLN
INTRAVENOUS | Status: DC
  Administered 2015-02-10: 100 mL/h via INTRAVENOUS

## 2015-02-10 MED ORDER — HEPARIN (PORCINE) IN NACL 100-0.45 UNIT/ML-% IJ SOLN
900.0000 [IU]/h | INTRAMUSCULAR | Status: DC
  Filled 2015-02-10: qty 250

## 2015-02-10 MED ORDER — LIDOCAINE HCL (PF) 1 % IJ SOLN
INTRAMUSCULAR | Status: AC
  Filled 2015-02-10: qty 30

## 2015-02-10 MED ORDER — HEPARIN (PORCINE) IN NACL 2-0.9 UNIT/ML-% IJ SOLN
INTRAMUSCULAR | Status: AC
  Filled 2015-02-10: qty 1000

## 2015-02-10 MED ORDER — LIDOCAINE HCL (PF) 1 % IJ SOLN
INTRAMUSCULAR | Status: DC | PRN
  Administered 2015-02-10: 12 mL

## 2015-02-10 SURGICAL SUPPLY — 9 items
CATH ANGIO 5F PIGTAIL 65CM (CATHETERS) ×3 IMPLANT
COVER PRB 48X5XTLSCP FOLD TPE (BAG) ×2 IMPLANT
COVER PROBE 5X48 (BAG) ×1
KIT PV (KITS) ×3 IMPLANT
SHEATH PINNACLE 5F 10CM (SHEATH) ×3 IMPLANT
SYR MEDRAD MARK V 150ML (SYRINGE) ×3 IMPLANT
TRANSDUCER W/STOPCOCK (MISCELLANEOUS) ×3 IMPLANT
TRAY PV CATH (CUSTOM PROCEDURE TRAY) ×3 IMPLANT
WIRE HITORQ VERSACORE ST 145CM (WIRE) ×3 IMPLANT

## 2015-02-10 NOTE — Progress Notes (Signed)
   VASCULAR SURGERY ASSESSMENT & PLAN:  * Admitted with left lower extremity ischemia. 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 He states that his leg feels better this morning. Proceed with arteriogram to further assess and make further recommendations pending these results. I have again discussed the procedure with the patient.    SUBJECTIVE: "foot feels better"  PHYSICAL EXAM: Filed Vitals:   02/09/15 1845 02/09/15 2016 02/09/15 2221 02/10/15 0555  BP: 149/72 133/73 94/59 93/49   Pulse: 67 66 69 54  Temp:  98.1 F (36.7 C)  97.9 F (36.6 C)  TempSrc:  Oral  Oral  Resp:  18  16  Height:      Weight:      SpO2: 99% 100%  98%   Both feet warm, pink Diminished left femoral pulse. Normal right femoral pulse.  LABS: Lab Results  Component Value Date   WBC 7.7 02/10/2015   HGB 11.3* 02/10/2015   HCT 32.6* 02/10/2015   MCV 95.0 02/10/2015   PLT 191 02/10/2015   Lab Results  Component Value Date   CREATININE 0.93 02/09/2015   Lab Results  Component Value Date   INR 0.91 02/09/2015   Active Problems:   Ischemic leg  Gae Gallop Beeper: [phone] 02/10/2015

## 2015-02-10 NOTE — Progress Notes (Signed)
ANTICOAGULATION CONSULT NOTE - Follow Up Consult  Pharmacy Consult for heparin Indication: ischemic LLE   Labs:  Recent Labs  02/09/15 1700 02/09/15 2336 02/10/15 0459  HGB 13.2  --  11.3*  HCT 38.3*  --  32.6*  PLT 244  --  191  LABPROT 12.5  --   --   INR 0.91  --   --   HEPARINUNFRC  --  0.60 0.80*  CREATININE 0.93  --   --       Assessment: 64yo male now supratherapeutic on heparin after one level at higher end of goal, apparently accumulating.  Goal of Therapy:  Heparin level 0.3-0.7 units/ml   Plan:  Will decrease heparin gtt by 2-3 units/kg/hr to 900 units/hr and check level in 6hr.  Wynona Neat, PharmD, BCPS  02/10/2015,6:27 AM

## 2015-02-10 NOTE — Progress Notes (Signed)
ANTICOAGULATION CONSULT NOTE - Follow Up Consult  Pharmacy Consult for heparin Indication: ischemic LLE   Labs:  Recent Labs  02/09/15 1700 02/09/15 2336  HGB 13.2  --   HCT 38.3*  --   PLT 244  --   LABPROT 12.5  --   INR 0.91  --   HEPARINUNFRC  --  0.60  CREATININE 0.93  --     Assessment/Plan:  64yo male therapeutic on heparin with initial dosing for ischemia. Will continue gtt at current rate and confirm stable with am labs.   Wynona Neat, PharmD, BCPS  02/10/2015,12:26 AM

## 2015-02-10 NOTE — Discharge Instructions (Signed)

## 2015-02-10 NOTE — Interval H&P Note (Signed)
History and Physical Interval Note:  02/10/2015 10:24 AM  Rodney [name]  has presented today for surgery, with the diagnosis of left leg pain. numbness  The various methods of treatment have been discussed with the patient and family. After consideration of risks, benefits and other options for treatment, the patient has consented to  Procedure(s): Abdominal Aortogram (N/A) as a surgical intervention .  The patient's history has been reviewed, patient examined, no change in status, stable for surgery.  I have reviewed the patient's chart and labs.  Questions were answered to the patient's satisfaction.     Ruta Hinds

## 2015-02-10 NOTE — H&P (View-Only) (Signed)
   VASCULAR SURGERY ASSESSMENT & PLAN:  * Admitted with left lower extremity ischemia. 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 He states that his leg feels better this morning. Proceed with arteriogram to further assess and make further recommendations pending these results. I have again discussed the procedure with the patient.    SUBJECTIVE: "foot feels better"  PHYSICAL EXAM: Filed Vitals:   02/09/15 1845 02/09/15 2016 02/09/15 2221 02/10/15 0555  BP: 149/72 133/73 94/59 93/49   Pulse: 67 66 69 54  Temp:  98.1 F (36.7 C)  97.9 F (36.6 C)  TempSrc:  Oral  Oral  Resp:  18  16  Height:      Weight:      SpO2: 99% 100%  98%   Both feet warm, pink Diminished left femoral pulse. Normal right femoral pulse.  LABS: Lab Results  Component Value Date   WBC 7.7 02/10/2015   HGB 11.3* 02/10/2015   HCT 32.6* 02/10/2015   MCV 95.0 02/10/2015   PLT 191 02/10/2015   Lab Results  Component Value Date   CREATININE 0.93 02/09/2015   Lab Results  Component Value Date   INR 0.91 02/09/2015   Active Problems:   Ischemic leg  Gae Gallop Beeper: [phone] 02/10/2015

## 2015-02-13 ENCOUNTER — Encounter (HOSPITAL_COMMUNITY): Payer: Self-pay | Admitting: Vascular Surgery

## 2015-02-14 ENCOUNTER — Other Ambulatory Visit: Payer: Self-pay

## 2015-02-14 ENCOUNTER — Encounter (HOSPITAL_COMMUNITY): Payer: Self-pay | Admitting: *Deleted

## 2015-02-14 ENCOUNTER — Emergency Department (HOSPITAL_COMMUNITY)
Admission: EM | Admit: 2015-02-14 | Discharge: 2015-02-14 | Discharge status: Against Medical Advice | Payer: 59 | Source: Home / Self Care | Attending: Emergency Medicine | Admitting: Emergency Medicine

## 2015-02-14 DIAGNOSIS — H269 Unspecified cataract: Secondary | ICD-10-CM | POA: Insufficient documentation

## 2015-02-14 DIAGNOSIS — Z79899 Other long term (current) drug therapy: Secondary | ICD-10-CM

## 2015-02-14 DIAGNOSIS — I70222 Atherosclerosis of native arteries of extremities with rest pain, left leg: Secondary | ICD-10-CM | POA: Diagnosis not present

## 2015-02-14 DIAGNOSIS — R209 Unspecified disturbances of skin sensation: Secondary | ICD-10-CM | POA: Diagnosis not present

## 2015-02-14 DIAGNOSIS — Z720 Tobacco use: Secondary | ICD-10-CM | POA: Insufficient documentation

## 2015-02-14 DIAGNOSIS — R011 Cardiac murmur, unspecified: Secondary | ICD-10-CM | POA: Insufficient documentation

## 2015-02-14 DIAGNOSIS — Z7982 Long term (current) use of aspirin: Secondary | ICD-10-CM | POA: Insufficient documentation

## 2015-02-14 DIAGNOSIS — R200 Anesthesia of skin: Secondary | ICD-10-CM

## 2015-02-14 DIAGNOSIS — Z8546 Personal history of malignant neoplasm of prostate: Secondary | ICD-10-CM

## 2015-02-14 DIAGNOSIS — R208 Other disturbances of skin sensation: Secondary | ICD-10-CM

## 2015-02-14 NOTE — ED Notes (Signed)
Pt seen on September 8 at [facility] and diagnosed with blockages in legs,  He has decreased pedal pulse when sitting but upon moving his pulse and color in lower extremity does better

## 2015-02-14 NOTE — ED Notes (Signed)
Patient states his foot/leg went numb today but is not currently numb. Pt able to move toes and is ready to go.

## 2015-02-14 NOTE — ED Provider Notes (Signed)
CSN: [id number]     Arrival date & time 02/14/15  1946 History   First MD Initiated Contact with Patient 02/14/15 2004     Chief Complaint  Patient presents with  . Leg Pain  . Foot Pain     (Consider location/radiation/quality/duration/timing/severity/associated sxs/prior Treatment) Patient is a 64 y.o. male presenting with leg pain and lower extremity pain. The history is provided by the patient. No language interpreter was used.  Leg Pain Associated symptoms: no fever   Foot Pain Associated symptoms include numbness. Pertinent negatives include no chest pain, chills, fever or weakness.  Rodney [name] is a 64 y.o male with a history of stroke, hypertension, hyperlipidemia, and known claudication to the left calf who presents with left distal foot numbness that began earlier today. He states he was evaluated and admitted for 2 days due to left leg claudication and occlusion. He is scheduled to have surgery next week. He states he became worried today since the feeling in his foot had come back but has now become numb again.  He is currently anticoagulated with 81mg  of aspirin daily.  He denies any fever, chills, chest pain, shortness of breath, or leg swelling.    Past Medical History  Diagnosis Date  . Prostate cancer 05/03/2009    Prostatectomy/Adenocrcinoma  . Heart murmur     history  . Cataract     left eye surgery/   . Radiation 07/02/11-08/22/11    Prostate fossa 68.4 gray 38 fractions   Past Surgical History  Procedure Laterality Date  . Left eye surgery    . Hemorrhoid surgery    . Robot assisted laparoscopic radical prostatectomy  05/03/2009  . Wrist surgery      left  . Left heart catheterization with coronary angiogram N/A 02/25/2012    Procedure: LEFT HEART CATHETERIZATION WITH CORONARY ANGIOGRAM;  Surgeon: Clent Demark, MD;  Location: [facility] CATH LAB;  Service: Cardiovascular;  Laterality: N/A;  . Peripheral vascular catheterization N/A 02/10/2015    Procedure: Abdominal  Aortogram;  Surgeon: Elam Dutch, MD;  Location: [place] [facility] CV LAB;  Service: Cardiovascular;  Laterality: N/A;  . Peripheral vascular catheterization Bilateral 02/10/2015    Procedure: Lower Extremity Angiography;  Surgeon: Elam Dutch, MD;  Location: [place] CV LAB;  Service: Cardiovascular;  Laterality: Bilateral;   Family History  Problem Relation Age of Onset  . Uterine cancer Mother 34    going to [employer]  . Breast cancer Sister    Social History  Substance Use Topics  . Smoking status: Current Every Day Smoker -- 2.00 packs/day for 36 years    Types: Cigarettes  . Smokeless tobacco: Never Used  . Alcohol Use: 3.6 oz/week    6 Cans of beer per week     Comment: 6-7  cans drinks daily 12 oz    Review of Systems  Constitutional: Negative for fever and chills.  Respiratory: Negative for shortness of breath.   Cardiovascular: Negative for chest pain and leg swelling.  Neurological: Positive for numbness. Negative for weakness.  All other systems reviewed and are negative.     Allergies  Other  Home Medications   Prior to Admission medications   Medication Sig Start Date End Date Taking? Authorizing Provider  aspirin EC 81 MG EC tablet Take 1 tablet (81 mg total) by mouth daily. 02/27/12   Charolette Forward, MD  atorvastatin (LIPITOR) 80 MG tablet Take 1 tablet (80 mg total) by mouth daily at 6 PM. 02/27/12  Charolette Forward, MD  dorzolamide-timolol (COSOPT) 22.3-6.8 MG/ML ophthalmic solution Place 1 drop into both eyes 2 (two) times daily.    Historical Provider, MD  latanoprost (XALATAN) 0.005 % ophthalmic solution Place 1 drop into both eyes at bedtime.    Historical Provider, MD  metoprolol succinate (TOPROL-XL) 25 MG 24 hr tablet Take 25 mg by mouth 2 (two) times daily.    Historical Provider, MD  nitroGLYCERIN (NITROSTAT) 0.4 MG SL tablet Place 1 tablet (0.4 mg total) under the tongue every 5 (five) minutes x 3 doses as needed for chest pain. 02/27/12   Charolette Forward, MD  ramipril (ALTACE) 5 MG capsule Take 5 mg by mouth daily.    Historical Provider, MD   BP 130/70 mmHg  Pulse 86  Temp(Src) 99 F (37.2 C) (Oral)  Resp 20  SpO2 95% Physical Exam  Constitutional: He is oriented to person, place, and time. He appears well-developed and well-nourished. No distress.  HENT:  Head: Normocephalic and atraumatic.  Eyes: Conjunctivae are normal.  Neck: Normal range of motion. Neck supple.  Cardiovascular: Normal rate, regular rhythm and normal heart sounds.   Pulmonary/Chest: Effort normal and breath sounds normal. No respiratory distress. He has no wheezes. He has no rales.  Abdominal: Soft. There is no tenderness.  Musculoskeletal: Normal range of motion.  Left leg and foot: 2+DP pulse. <3 second capillary refill. No calf or foot tenderness.  Full ROM of the foot and leg.  Numbness from the midfoot to the proximal toes. No pallor.   Neurological: He is alert and oriented to person, place, and time.  Skin: Skin is warm and dry.  Nursing note and vitals reviewed.   ED Course  Procedures (including critical care time) Labs Review Labs Reviewed - No data to display  Imaging Review No results found.   EKG Interpretation None      MDM   Final diagnoses:  Numbness of left foot   Patient presents for left foot numbness.  He has known claudication and occlusion of the runoff vessels in the left upper calf.  He is scheduled for surgery next week.   Patient left without my knowledge and signed out Rodney [name].      Ottie Glazier, PA-C 02/14/15 2208  Noemi Chapel, MD 02/16/15 2204

## 2015-02-15 ENCOUNTER — Telehealth: Payer: Self-pay

## 2015-02-15 NOTE — Telephone Encounter (Signed)
Phone call from pt.  Reported he went to the [facility] ER yesterday due to numbness in the left leg.  Reported he chose to leave without being released, due to his transportation situation.  C/o intermittent numbness and coolness of left leg, that can last approx. 2 hrs.  Asking about possibility of moving-up his surgery date.  Stated "I can't do anything, and just want to go ahead and get it over with."    Advised will inform Dr. Scot Dock about his request, but that there is no opening in his surgery schedule prior to 9/22.

## 2015-02-15 NOTE — Telephone Encounter (Signed)
Discussed pt's. symptoms with Dr. Scot Dock.  Stated if pt's. symptoms worsen; ie: rest pain, mottling of skin left LE, he should be admitted on IV Heparin, otherwise keep plan for surgery on 02/23/15.  Phone call to pt.  Advised to call office or go to the ER if symptoms of rest pain occur, or if skin color of (L) LE changes to a purple/gray discoloration/ and cold temperature.  Informed pt. will continue plan to do his surgery 9/22, unless symptoms worsen.  Pt. verb. understanding.

## 2015-02-16 ENCOUNTER — Inpatient Hospital Stay (HOSPITAL_COMMUNITY): Payer: 59

## 2015-02-16 ENCOUNTER — Inpatient Hospital Stay (HOSPITAL_COMMUNITY)
Admission: AD | Admit: 2015-02-16 | Discharge: 2015-02-18 | DRG: 271 | Disposition: A | Discharge status: Home / Self Care | Payer: 59 | Source: Ambulatory Visit | Attending: Vascular Surgery | Admitting: Vascular Surgery

## 2015-02-16 ENCOUNTER — Telehealth: Payer: Self-pay

## 2015-02-16 DIAGNOSIS — E780 Pure hypercholesterolemia: Secondary | ICD-10-CM | POA: Diagnosis present

## 2015-02-16 DIAGNOSIS — I252 Old myocardial infarction: Secondary | ICD-10-CM | POA: Diagnosis not present

## 2015-02-16 DIAGNOSIS — Z955 Presence of coronary angioplasty implant and graft: Secondary | ICD-10-CM | POA: Diagnosis not present

## 2015-02-16 DIAGNOSIS — Z8546 Personal history of malignant neoplasm of prostate: Secondary | ICD-10-CM | POA: Diagnosis not present

## 2015-02-16 DIAGNOSIS — Z923 Personal history of irradiation: Secondary | ICD-10-CM | POA: Diagnosis not present

## 2015-02-16 DIAGNOSIS — R209 Unspecified disturbances of skin sensation: Secondary | ICD-10-CM | POA: Diagnosis present

## 2015-02-16 DIAGNOSIS — F17210 Nicotine dependence, cigarettes, uncomplicated: Secondary | ICD-10-CM | POA: Diagnosis present

## 2015-02-16 DIAGNOSIS — I998 Other disorder of circulatory system: Secondary | ICD-10-CM | POA: Diagnosis not present

## 2015-02-16 DIAGNOSIS — N390 Urinary tract infection, site not specified: Secondary | ICD-10-CM | POA: Diagnosis present

## 2015-02-16 DIAGNOSIS — Z9889 Other specified postprocedural states: Secondary | ICD-10-CM

## 2015-02-16 DIAGNOSIS — I70222 Atherosclerosis of native arteries of extremities with rest pain, left leg: Principal | ICD-10-CM | POA: Diagnosis present

## 2015-02-16 DIAGNOSIS — I739 Peripheral vascular disease, unspecified: Secondary | ICD-10-CM | POA: Diagnosis present

## 2015-02-16 DIAGNOSIS — I2510 Atherosclerotic heart disease of native coronary artery without angina pectoris: Secondary | ICD-10-CM | POA: Diagnosis present

## 2015-02-16 DIAGNOSIS — I10 Essential (primary) hypertension: Secondary | ICD-10-CM | POA: Diagnosis present

## 2015-02-16 DIAGNOSIS — Z01811 Encounter for preprocedural respiratory examination: Secondary | ICD-10-CM

## 2015-02-16 DIAGNOSIS — I70229 Atherosclerosis of native arteries of extremities with rest pain, unspecified extremity: Secondary | ICD-10-CM

## 2015-02-16 LAB — CBC
HCT: 38.3 % — ABNORMAL LOW (ref 39.0–52.0)
Hemoglobin: 13 g/dL (ref 13.0–17.0)
MCH: 33.2 pg (ref 26.0–34.0)
MCHC: 33.9 g/dL (ref 30.0–36.0)
MCV: 98 fL (ref 78.0–100.0)
PLATELETS: 286 10*3/uL (ref 150–400)
RBC: 3.91 MIL/uL — ABNORMAL LOW (ref 4.22–5.81)
RDW: 13.5 % (ref 11.5–15.5)
WBC: 6.8 10*3/uL (ref 4.0–10.5)

## 2015-02-16 LAB — COMPREHENSIVE METABOLIC PANEL
ALBUMIN: 3.1 g/dL — AB (ref 3.5–5.0)
ALT: 31 U/L (ref 17–63)
ANION GAP: 8 (ref 5–15)
AST: 46 U/L — ABNORMAL HIGH (ref 15–41)
Alkaline Phosphatase: 145 U/L — ABNORMAL HIGH (ref 38–126)
BUN: 7 mg/dL (ref 6–20)
CO2: 27 mmol/L (ref 22–32)
Calcium: 8.8 mg/dL — ABNORMAL LOW (ref 8.9–10.3)
Chloride: 97 mmol/L — ABNORMAL LOW (ref 101–111)
Creatinine, Ser: 0.99 mg/dL (ref 0.61–1.24)
GFR calc non Af Amer: >60 mL/min (ref >60)
GLUCOSE: 111 mg/dL — AB (ref 65–99)
POTASSIUM: 3.8 mmol/L (ref 3.5–5.1)
SODIUM: 132 mmol/L — AB (ref 135–145)
Total Bilirubin: 0.7 mg/dL (ref 0.3–1.2)
Total Protein: 6.2 g/dL — ABNORMAL LOW (ref 6.5–8.1)

## 2015-02-16 LAB — ABO/RH: ABO/RH(D): A POS

## 2015-02-16 LAB — TYPE AND SCREEN
ABO/RH(D): A POS
ANTIBODY SCREEN: NEGATIVE

## 2015-02-16 LAB — PROTIME-INR
INR: 0.94 (ref 0.00–1.49)
Prothrombin Time: 12.8 seconds (ref 11.6–15.2)

## 2015-02-16 LAB — APTT: aPTT: 30 s (ref 24–37)

## 2015-02-16 MED ORDER — GUAIFENESIN-DM 100-10 MG/5ML PO SYRP: 15.0000 mL | ORAL_SOLUTION | ORAL | Status: DC | PRN

## 2015-02-16 MED ORDER — LABETALOL HCL 5 MG/ML IV SOLN: 10.0000 mg | INTRAVENOUS | Status: DC | PRN

## 2015-02-16 MED ORDER — HEPARIN (PORCINE) IN NACL 100-0.45 UNIT/ML-% IJ SOLN
800.0000 [IU]/h | INTRAMUSCULAR | Status: AC
  Administered 2015-02-16: 800 [IU]/h via INTRAVENOUS
  Filled 2015-02-16: qty 250

## 2015-02-16 MED ORDER — CHLORHEXIDINE GLUCONATE CLOTH 2 % EX PADS
6.0000 | MEDICATED_PAD | Freq: Once | CUTANEOUS | Status: AC
  Administered 2015-02-17: 6 via TOPICAL

## 2015-02-16 MED ORDER — HYDRALAZINE HCL 20 MG/ML IJ SOLN: 5.0000 mg | INTRAMUSCULAR | Status: DC | PRN

## 2015-02-16 MED ORDER — OXYCODONE-ACETAMINOPHEN 5-325 MG PO TABS: 1.0000 | ORAL_TABLET | ORAL | Status: DC | PRN

## 2015-02-16 MED ORDER — HEPARIN BOLUS VIA INFUSION
3000.0000 [IU] | Freq: Once | INTRAVENOUS | Status: AC
  Administered 2015-02-16: 3000 [IU] via INTRAVENOUS
  Filled 2015-02-16: qty 3000

## 2015-02-16 MED ORDER — ACETAMINOPHEN 650 MG RE SUPP: 325.0000 mg | RECTAL | Status: DC | PRN

## 2015-02-16 MED ORDER — SENNOSIDES-DOCUSATE SODIUM 8.6-50 MG PO TABS: 1.0000 | ORAL_TABLET | Freq: Every evening | ORAL | Status: DC | PRN

## 2015-02-16 MED ORDER — CEFAZOLIN SODIUM 1-5 GM-% IV SOLN: 1.0000 g | INTRAVENOUS | Status: AC

## 2015-02-16 MED ORDER — DOCUSATE SODIUM 100 MG PO CAPS
100.0000 mg | ORAL_CAPSULE | Freq: Two times a day (BID) | ORAL | Status: DC
  Administered 2015-02-16: 100 mg via ORAL
  Filled 2015-02-16: qty 1

## 2015-02-16 MED ORDER — ONDANSETRON HCL 4 MG/2ML IJ SOLN: 4.0000 mg | Freq: Four times a day (QID) | INTRAMUSCULAR | Status: DC | PRN

## 2015-02-16 MED ORDER — BISACODYL 10 MG RE SUPP: 10.0000 mg | Freq: Every day | RECTAL | Status: DC | PRN

## 2015-02-16 MED ORDER — MORPHINE SULFATE (PF) 2 MG/ML IV SOLN: 2.0000 mg | INTRAVENOUS | Status: DC | PRN

## 2015-02-16 MED ORDER — ACETAMINOPHEN 325 MG PO TABS: 325.0000 mg | ORAL_TABLET | ORAL | Status: DC | PRN

## 2015-02-16 MED ORDER — SODIUM CHLORIDE 0.9 % IJ SOLN: 3.0000 mL | INTRAMUSCULAR | Status: DC | PRN

## 2015-02-16 MED ORDER — PHENOL 1.4 % MT LIQD: 1.0000 | OROMUCOSAL | Status: DC | PRN

## 2015-02-16 MED ORDER — CHLORHEXIDINE GLUCONATE CLOTH 2 % EX PADS
6.0000 | MEDICATED_PAD | Freq: Once | CUTANEOUS | Status: AC
  Administered 2015-02-16: 6 via TOPICAL

## 2015-02-16 MED ORDER — METOPROLOL TARTRATE 1 MG/ML IV SOLN: 2.0000 mg | INTRAVENOUS | Status: DC | PRN

## 2015-02-16 MED ORDER — DEXTROSE 5 % IV SOLN
1.5000 g | INTRAVENOUS | Status: AC
  Administered 2015-02-17: 1.5 g via INTRAVENOUS
  Filled 2015-02-16: qty 1.5

## 2015-02-16 MED ORDER — PANTOPRAZOLE SODIUM 40 MG PO TBEC: 40.0000 mg | DELAYED_RELEASE_TABLET | Freq: Every day | ORAL | Status: DC

## 2015-02-16 MED ORDER — ALUM & MAG HYDROXIDE-SIMETH 200-200-20 MG/5ML PO SUSP: 15.0000 mL | ORAL | Status: DC | PRN

## 2015-02-16 MED ORDER — POTASSIUM CHLORIDE CRYS ER 20 MEQ PO TBCR
20.0000 meq | EXTENDED_RELEASE_TABLET | Freq: Once | ORAL | Status: AC
  Administered 2015-02-16: 20 meq via ORAL
  Filled 2015-02-16: qty 1

## 2015-02-16 MED ORDER — ENOXAPARIN SODIUM 40 MG/0.4ML ~~LOC~~ SOLN: 40.0000 mg | SUBCUTANEOUS | Status: DC

## 2015-02-16 MED ORDER — SODIUM CHLORIDE 0.9 % IV SOLN: 250.0000 mL | INTRAVENOUS | Status: DC | PRN

## 2015-02-16 MED ORDER — SODIUM CHLORIDE 0.9 % IJ SOLN
3.0000 mL | Freq: Two times a day (BID) | INTRAMUSCULAR | Status: DC
  Administered 2015-02-16: 3 mL via INTRAVENOUS

## 2015-02-16 MED ORDER — SODIUM CHLORIDE 0.9 % IV SOLN
INTRAVENOUS | Status: DC
  Administered 2015-02-16: 21:00:00 via INTRAVENOUS

## 2015-02-16 NOTE — Telephone Encounter (Signed)
rec'd v.o. from Dr. Scot Dock to order Plavix for pt., to start today, prior to procedure on 9/22.  Phone call to pt.  He reported he hasn't been getting out of bed unless going to the [facility];  Voiced concern of "the circulation shutting down in left foot."  Reported since yesterday, he noticed the foot feel ice cold at times, and with moving the foot downward, with toes pointing to floor, has increased pain in the posterior ankle.  Stated the numbness is worse.  Reported the left foot looks more pale than the right foot; denies any cyanosis.  Stated "I'm scared to death."  Paged Dr. Scot Dock.  Informed of the pt's symptoms. Rec'd v.o. to admit pt. to 2W @ [facility],start IV Heparin, and keep NPO, as pt. may need to go to surgery today.  Pt. Notified of plan.  Verb. Understanding.  Paged PA to write admitting orders.

## 2015-02-16 NOTE — H&P (Signed)
[facility] [place]  Patient name: Rodney [name] MRN: [id number] DOB: [birth year] Sex: male  REASON FOR ADMISSION: Intermittent paresthesias left leg with severe left common femoral artery stenosis  HPI: Rodney [name] is a 64 y.o. male Who I originally saw in consultation on 02/09/2015 with sudden onset of paresthesias on the left leg. Prior to this event he did admit to some bilateral calf claudication. His risk factors for vascular disease included a  A 2 pack per day smoking history for 35 years although he quit 7 years ago, hypertension and hypercholesterolemia. He underwent an arteriogram which showed a severe left common femoral artery stenosis. His arteries were small with some diffuse disease and he had two-vessel runoff on the left knee and the anterior tibial. Arteries. It was also some plaque in the left common iliac artery although this was not felt to be flow limiting. As there was no evidence of an acute embolus or acute occlusion he was set up for elective left common femoral artery endarterectomy. However, he has been having intermittent episodes of paresthesias in the left lower extremity and was concerned so was sent to the hospital for evaluation and possible surgery earlier than planned.  He has been having intermittent episodes of weakness and pain in the left lower extremity which resolve. He states that the leg gets "ice cold" and then this improves. He is not aware of any episodes of hypotension associated with this. He denies any associated symptoms.   Past Medical History  Diagnosis Date  . Prostate cancer 05/03/2009    Prostatectomy/Adenocrcinoma  . Heart murmur     history  . Cataract     left eye surgery/   . Radiation 07/02/11-08/22/11    Prostate fossa 68.4 gray 38 fractions    Family History  Problem Relation Age of Onset  . Uterine cancer Mother 26    going to [employer]  . Breast cancer Sister     SOCIAL HISTORY: Social History    Substance Use Topics  . Smoking status: Current Every Day Smoker -- 2.00 packs/day for 36 years    Types: Cigarettes  . Smokeless tobacco: Never Used  . Alcohol Use: 3.6 oz/week    6 Cans of beer per week     Comment: 6-7  cans drinks daily 12 oz    Allergies  Allergen Reactions  . Other Rash    polyestor and metals except titanium    Current Facility-Administered Medications  Medication Dose Route Frequency Provider Last Rate Last Dose  . 0.9 %  sodium chloride infusion  250 mL Intravenous PRN Ulyses Amor, PA-C      . 0.9 %  sodium chloride infusion   Intravenous Continuous Angelia Mould, MD      . acetaminophen (TYLENOL) tablet 325-650 mg  325-650 mg Oral Q4H PRN Ulyses Amor, PA-C       Or  . acetaminophen (TYLENOL) suppository 325-650 mg  325-650 mg Rectal Q4H PRN Ulyses Amor, PA-C      . alum & mag hydroxide-simeth (MAALOX/MYLANTA) 200-200-20 MG/5ML suspension 15-30 mL  15-30 mL Oral Q2H PRN Ulyses Amor, PA-C      . bisacodyl (DULCOLAX) suppository 10 mg  10 mg Rectal Daily PRN Ulyses Amor, PA-C      . cefUROXime (ZINACEF) 1.5 g in dextrose 5 % 50 mL IVPB  1.5 g Intravenous To OR Angelia Mould, MD      . Chlorhexidine Gluconate  Cloth 2 % PADS 6 each  6 each Topical Once Angelia Mould, MD       And  . Chlorhexidine Gluconate Cloth 2 % PADS 6 each  6 each Topical Once Angelia Mould, MD      . docusate sodium (COLACE) capsule 100 mg  100 mg Oral BID Ulyses Amor, PA-C      . guaiFENesin-dextromethorphan (ROBITUSSIN DM) 100-10 MG/5ML syrup 15 mL  15 mL Oral Q4H PRN Ulyses Amor, PA-C      . heparin ADULT infusion 100 units/mL (25000 units/250 mL)  800 Units/hr Intravenous Continuous [place], [facility]      . heparin bolus via infusion 3,000 Units  3,000 Units Intravenous Once Alvira Philips, RPH      . hydrALAZINE (APRESOLINE) injection 5 mg  5 mg Intravenous Q20 Min PRN Ulyses Amor, PA-C      . labetalol  (NORMODYNE,TRANDATE) injection 10 mg  10 mg Intravenous Q10 min PRN Ulyses Amor, PA-C      . metoprolol (LOPRESSOR) injection 2-5 mg  2-5 mg Intravenous Q2H PRN Ulyses Amor, PA-C      . morphine 2 MG/ML injection 2-5 mg  2-5 mg Intravenous Q1H PRN Ulyses Amor, PA-C      . ondansetron [facility]) injection 4 mg  4 mg Intravenous Q6H PRN Ulyses Amor, PA-C      . oxyCODONE-acetaminophen (PERCOCET/ROXICET) 5-325 MG per tablet 1-2 tablet  1-2 tablet Oral Q4H PRN Ulyses Amor, PA-C      . pantoprazole (PROTONIX) EC tablet 40 mg  40 mg Oral Daily Ulyses Amor, PA-C   40 mg at 02/16/15 1745  . phenol (CHLORASEPTIC) mouth spray 1 spray  1 spray Mouth/Throat PRN Ulyses Amor, PA-C      . potassium chloride SA (K-DUR,KLOR-CON) CR tablet 20-40 mEq  20-40 mEq Oral Once Ulyses Amor, PA-C   20 mEq at 02/16/15 1745  . senna-docusate (Senokot-S) tablet 1 tablet  1 tablet Oral QHS PRN Ulyses Amor, PA-C      . sodium chloride 0.9 % injection 3 mL  3 mL Intravenous Q12H Ulyses Amor, PA-C      . sodium chloride 0.9 % injection 3 mL  3 mL Intravenous PRN Ulyses Amor, PA-C        REVIEW OF SYSTEMS: Valu.Nieves ] denotes positive finding; [  ] denotes negative finding CARDIOVASCULAR:  [ ]  chest pain   [ ]  chest pressure   [ ]  palpitations   [ ]  orthopnea   [ ]  dyspnea on exertion   [ ]  claudication   [ ]  rest pain   [ ]  DVT   [ ]  phlebitis PULMONARY:   [ ]  productive cough   [ ]  asthma   [ ]  wheezing NEUROLOGIC:   Valu.Nieves ] weakness Left leg  Valu.Nieves ] paresthesias left leg  [ ]  aphasia  [ ]  amaurosis  [ ]  dizziness HEMATOLOGIC:   [ ]  bleeding problems   [ ]  clotting disorders MUSCULOSKELETAL:  Valu.Nieves ] joint pain   [ ]  joint swelling [ ]  leg swelling GASTROINTESTINAL: [ ]   blood in stool  [ ]   hematemesis GENITOURINARY:  [ ]   dysuria  [ ]   hematuria PSYCHIATRIC:  [ ]  history of major depression INTEGUMENTARY:  [ ]  rashes  [ ]  ulcers CONSTITUTIONAL:  [ ]  fever   [ ]  chills  PHYSICAL EXAM: Filed Vitals:  02/16/15 1610  BP: 141/71  Pulse: 63  Temp: 98.7 F (37.1 C)  TempSrc: Oral  Resp: 20  SpO2: 100%   There is no weight on file to calculate BMI. GENERAL: The patient is a well-nourished male, in no acute distress. The vital signs are documented above. CARDIAC: There is a regular rate and rhythm.  VASCULAR: I do not detect carotid bruits. He has diminished femoral pulses. I cannot palpate pedal pulses. In the left side he has a monophasic anterior tibial signal with the Doppler. Both feet are cool. PULMONARY: There is good air exchange bilaterally without wheezing or rales. ABDOMEN: Soft and non-tender with normal pitched bowel sounds.  MUSCULOSKELETAL: There are no major deformities. NEUROLOGIC: No focal weakness or paresthesias are detected. SKIN: There are no ulcers or rashes noted. PSYCHIATRIC: The patient has a normal affect.  DATA:  Lab Results  Component Value Date   WBC 6.8 02/16/2015   HGB 13.0 02/16/2015   HCT 38.3* 02/16/2015   MCV 98.0 02/16/2015   PLT 286 02/16/2015   Lab Results  Component Value Date   NA 132* 02/16/2015   K 3.8 02/16/2015   CL 97* 02/16/2015   CO2 27 02/16/2015   Lab Results  Component Value Date   CREATININE 0.99 02/16/2015   Lab Results  Component Value Date   INR 0.94 02/16/2015   INR 0.91 02/09/2015   INR 0.88 02/25/2012   Lab Results  Component Value Date   HGBA1C 5.4 02/25/2012    AORTOGRAM WITH RUNOFF: I have reviewed his aortogram with runoff which was performed by Dr. Ruta Hinds. This showed that his aorta in bilateral common iliac arteries were patent. On the left side which is the site of concern, there is some plaque in the common iliac artery although it was not felt to be flow limiting. There was severe stenosis of the left common femoral artery. The superficial femoral artery and deep femoral arteries were patent and was a focal calcific plaque in the distal superficial femoral artery which was not flow limiting.  The posterior tibial artery appeared chronically occluded. There was two-vessel runoff on the left via the anterior tibial and peroneal arteries. On the left side he had some disease in the common femoral artery also external iliac artery with a patent superficial femoral artery and deep femoral artery. He appeared to have 2 vessel runoff on the right also via the anterior tibial arteries.  MEDICAL ISSUES:  SEVERE LEFT COMMON FEMORAL ARTERY STENOSIS: This patient has a severe left common femoral artery stenosis and I have recommended femoral endarterectomy with patch angioplasty. It is not clear why he is having intermittent paresthesias and coolness in the left foot and less this is potentially related to his blood pressure. Differential diagnosis would also include embolization. He does have a plaque in his common iliac artery that was not felt to be flow limiting at the time of his arteriogram. However I recommend that we also assess this intraoperatively and potentially address this if it is felt to be significant with a covered stent. I have discussed indications for surgery and the potential complications and he is agreeable to proceed. I'll place him on heparin tonight and will proceed first thing in the morning. His surgery had originally been scheduled for next week however given that he is having recurrent symptoms I feel that it would be safest to proceed sooner.  Deitra Mayo [facility] [place]: [phone]

## 2015-02-16 NOTE — Progress Notes (Addendum)
ANTICOAGULATION CONSULT NOTE - Initial Consult  Pharmacy Consult for heparin Indication: ischemic L leg  Allergies  Allergen Reactions  . Other Rash    polyestor and metals except titanium    Patient Measurements: 67.5" 58.1 kg  Vital Signs: Temp: 98.7 F (37.1 C) (09/15 1610) Temp Source: Oral (09/15 1610) BP: 141/71 mmHg (09/15 1610) Pulse Rate: 63 (09/15 1610)  Labs:  Recent Labs  02/16/15 1801  HGB 13.0  HCT 38.3*  PLT 286  LABPROT 12.8  INR 0.94  CREATININE 0.99    Estimated Creatinine Clearance: 63.6 mL/min (by C-G formula based on Cr of 0.99).   Medical History: Past Medical History  Diagnosis Date  . Prostate cancer 05/03/2009    Prostatectomy/Adenocrcinoma  . Heart murmur     history  . Cataract     left eye surgery/   . Radiation 07/02/11-08/22/11    Prostate fossa 68.4 gray 38 fractions    Medications:  Prescriptions prior to admission  Medication Sig Dispense Refill Last Dose  . aspirin EC 81 MG EC tablet Take 1 tablet (81 mg total) by mouth daily. 30 tablet 3 02/16/2015 at Unknown time  . atorvastatin (LIPITOR) 80 MG tablet Take 1 tablet (80 mg total) by mouth daily at 6 PM. 30 tablet 3 02/15/2015 at Unknown time  . dorzolamide-timolol (COSOPT) 22.3-6.8 MG/ML ophthalmic solution Place 1 drop into both eyes 2 (two) times daily.   02/16/2015 at Unknown time  . latanoprost (XALATAN) 0.005 % ophthalmic solution Place 1 drop into both eyes at bedtime.   02/15/2015 at Unknown time  . metoprolol succinate (TOPROL-XL) 25 MG 24 hr tablet Take 25 mg by mouth 2 (two) times daily.   02/16/2015 at 800  . ramipril (ALTACE) 5 MG capsule Take 5 mg by mouth daily.   02/16/2015 at Unknown time  . nitroGLYCERIN (NITROSTAT) 0.4 MG SL tablet Place 1 tablet (0.4 mg total) under the tongue every 5 (five) minutes x 3 doses as needed for chest pain. (Patient not taking: Reported on 02/16/2015) 25 tablet 3 Not Taking at Unknown time    Assessment: 64 y/o male with sudden  onset of left leg paresthesia with severe left common femoral artery stenosis directly admitted to the hospital for evaluation. Pharmacy consulted to begin IV heparin for L leg ischemia. Renal function and CBC are normal.  Goal of Therapy:  Heparin level 0.3-0.7 units/ml Monitor platelets by anticoagulation protocol: Yes   Plan:  - Heparin 3000 units IV bolus then 800 units/hr - off 9/16 at 05:00 - 6 hr heparin level - Will f/u after surgery tomorrow - Monitor for s/sx of bleeding  [facility] [place].D., BCPS Clinical Pharmacist Pager: [phone] 02/16/2015 7:45 PM  ADDN: Initial HL is therapeutic at 0.33 on heparin 800 units/hr. No issues with infusion or bleeding noted.  Continue heparin 800 units/hr until [place] M. Diona Foley, PharmD Clinical Pharmacist Pager [phone]

## 2015-02-17 ENCOUNTER — Inpatient Hospital Stay (HOSPITAL_COMMUNITY): Payer: 59 | Admitting: Anesthesiology

## 2015-02-17 ENCOUNTER — Encounter (HOSPITAL_COMMUNITY)
Admission: AD | Disposition: A | Discharge status: Home / Self Care | Payer: Self-pay | Source: Ambulatory Visit | Attending: Vascular Surgery

## 2015-02-17 ENCOUNTER — Encounter (HOSPITAL_COMMUNITY): Payer: Self-pay | Admitting: Certified Registered"

## 2015-02-17 ENCOUNTER — Other Ambulatory Visit: Payer: Self-pay

## 2015-02-17 DIAGNOSIS — I70222 Atherosclerosis of native arteries of extremities with rest pain, left leg: Secondary | ICD-10-CM

## 2015-02-17 HISTORY — PX: ENDARTERECTOMY FEMORAL: SHX5804

## 2015-02-17 HISTORY — PX: PATCH ANGIOPLASTY: SHX6230

## 2015-02-17 LAB — URINALYSIS, ROUTINE W REFLEX MICROSCOPIC
Bilirubin Urine: NEGATIVE
Ketones, ur: 15 mg/dL — AB
Nitrite: POSITIVE — AB
PROTEIN: NEGATIVE mg/dL
Specific Gravity, Urine: 1.023 (ref 1.005–1.030)
Urobilinogen, UA: 0.2 mg/dL (ref 0.0–1.0)
pH: 5 (ref 5.0–8.0)

## 2015-02-17 LAB — CBC
HCT: 32.9 % — ABNORMAL LOW (ref 39.0–52.0)
Hemoglobin: 11 g/dL — ABNORMAL LOW (ref 13.0–17.0)
MCH: 32.6 pg (ref 26.0–34.0)
MCHC: 33.4 g/dL (ref 30.0–36.0)
MCV: 97.6 fL (ref 78.0–100.0)
PLATELETS: 244 10*3/uL (ref 150–400)
RBC: 3.37 MIL/uL — ABNORMAL LOW (ref 4.22–5.81)
RDW: 13.4 % (ref 11.5–15.5)
WBC: 5.3 10*3/uL (ref 4.0–10.5)

## 2015-02-17 LAB — SURGICAL PCR SCREEN
MRSA, PCR: NEGATIVE
STAPHYLOCOCCUS AUREUS: NEGATIVE

## 2015-02-17 LAB — HEPARIN LEVEL (UNFRACTIONATED): Heparin Unfractionated: 0.33 IU/mL (ref 0.30–0.70)

## 2015-02-17 LAB — GLUCOSE, CAPILLARY: GLUCOSE-CAPILLARY: 90 mg/dL (ref 65–99)

## 2015-02-17 LAB — URINE MICROSCOPIC-ADD ON

## 2015-02-17 SURGERY — ENDARTERECTOMY, FEMORAL
Anesthesia: General | Site: Groin | Laterality: Left

## 2015-02-17 MED ORDER — VITAMIN B-1 100 MG PO TABS
100.0000 mg | ORAL_TABLET | Freq: Every day | ORAL | Status: DC
  Administered 2015-02-17 – 2015-02-18 (×2): 100 mg via ORAL
  Filled 2015-02-17 (×2): qty 1

## 2015-02-17 MED ORDER — ACETAMINOPHEN 650 MG RE SUPP: 325.0000 mg | RECTAL | Status: DC | PRN

## 2015-02-17 MED ORDER — PHENOL 1.4 % MT LIQD: 1.0000 | OROMUCOSAL | Status: DC | PRN

## 2015-02-17 MED ORDER — OXYCODONE-ACETAMINOPHEN 5-325 MG PO TABS: 1.0000 | ORAL_TABLET | ORAL | Status: DC | PRN

## 2015-02-17 MED ORDER — 0.9 % SODIUM CHLORIDE (POUR BTL) OPTIME
TOPICAL | Status: DC | PRN
  Administered 2015-02-17: 2000 mL

## 2015-02-17 MED ORDER — SODIUM CHLORIDE 0.9 % IV SOLN
INTRAVENOUS | Status: DC | PRN
  Administered 2015-02-17: 500 mL

## 2015-02-17 MED ORDER — CIPROFLOXACIN IN D5W 400 MG/200ML IV SOLN
400.0000 mg | INTRAVENOUS | Status: DC
  Filled 2015-02-17: qty 200

## 2015-02-17 MED ORDER — FENTANYL CITRATE (PF) 100 MCG/2ML IJ SOLN
INTRAMUSCULAR | Status: DC | PRN
  Administered 2015-02-17: 100 ug via INTRAVENOUS
  Administered 2015-02-17: 50 ug via INTRAVENOUS

## 2015-02-17 MED ORDER — MAGNESIUM SULFATE 2 GM/50ML IV SOLN: 2.0000 g | Freq: Every day | INTRAVENOUS | Status: DC | PRN

## 2015-02-17 MED ORDER — POTASSIUM CHLORIDE CRYS ER 20 MEQ PO TBCR: 20.0000 meq | EXTENDED_RELEASE_TABLET | Freq: Every day | ORAL | Status: DC | PRN

## 2015-02-17 MED ORDER — PROPOFOL 10 MG/ML IV BOLUS
INTRAVENOUS | Status: AC
  Filled 2015-02-17: qty 20

## 2015-02-17 MED ORDER — GUAIFENESIN-DM 100-10 MG/5ML PO SYRP: 15.0000 mL | ORAL_SOLUTION | ORAL | Status: DC | PRN

## 2015-02-17 MED ORDER — ASPIRIN EC 81 MG PO TBEC
81.0000 mg | DELAYED_RELEASE_TABLET | Freq: Every day | ORAL | Status: DC
  Administered 2015-02-17 – 2015-02-18 (×2): 81 mg via ORAL
  Filled 2015-02-17 (×2): qty 1

## 2015-02-17 MED ORDER — ACETAMINOPHEN 325 MG PO TABS: 325.0000 mg | ORAL_TABLET | ORAL | Status: DC | PRN

## 2015-02-17 MED ORDER — ONDANSETRON HCL 4 MG/2ML IJ SOLN: 4.0000 mg | Freq: Four times a day (QID) | INTRAMUSCULAR | Status: DC | PRN

## 2015-02-17 MED ORDER — LIDOCAINE HCL (CARDIAC) 20 MG/ML IV SOLN
INTRAVENOUS | Status: DC | PRN
  Administered 2015-02-17: 80 mg via INTRAVENOUS

## 2015-02-17 MED ORDER — GLYCOPYRROLATE 0.2 MG/ML IJ SOLN
INTRAMUSCULAR | Status: DC | PRN
  Administered 2015-02-17: 0.2 mg via INTRAVENOUS

## 2015-02-17 MED ORDER — PROPOFOL 10 MG/ML IV BOLUS
INTRAVENOUS | Status: DC | PRN
  Administered 2015-02-17: 120 mg via INTRAVENOUS

## 2015-02-17 MED ORDER — THROMBIN 20000 UNITS EX SOLR
CUTANEOUS | Status: AC
  Filled 2015-02-17: qty 20000

## 2015-02-17 MED ORDER — MORPHINE SULFATE (PF) 2 MG/ML IV SOLN: 2.0000 mg | INTRAVENOUS | Status: DC | PRN

## 2015-02-17 MED ORDER — CIPROFLOXACIN IN D5W 400 MG/200ML IV SOLN
INTRAVENOUS | Status: DC | PRN
  Administered 2015-02-17: 400 mg via INTRAVENOUS

## 2015-02-17 MED ORDER — SENNOSIDES-DOCUSATE SODIUM 8.6-50 MG PO TABS
1.0000 | ORAL_TABLET | Freq: Every evening | ORAL | Status: DC | PRN
  Filled 2015-02-17: qty 1

## 2015-02-17 MED ORDER — PROTAMINE SULFATE 10 MG/ML IV SOLN
INTRAVENOUS | Status: DC | PRN
  Administered 2015-02-17: 10 mg via INTRAVENOUS
  Administered 2015-02-17: 20 mg via INTRAVENOUS
  Administered 2015-02-17: 10 mg via INTRAVENOUS

## 2015-02-17 MED ORDER — HYDROMORPHONE HCL 1 MG/ML IJ SOLN: 0.2500 mg | INTRAMUSCULAR | Status: DC | PRN

## 2015-02-17 MED ORDER — ENOXAPARIN SODIUM 40 MG/0.4ML ~~LOC~~ SOLN
40.0000 mg | SUBCUTANEOUS | Status: DC
  Administered 2015-02-18: 40 mg via SUBCUTANEOUS
  Filled 2015-02-17 (×2): qty 0.4

## 2015-02-17 MED ORDER — SUCCINYLCHOLINE CHLORIDE 20 MG/ML IJ SOLN
INTRAMUSCULAR | Status: DC | PRN
  Administered 2015-02-17: 100 mg via INTRAVENOUS

## 2015-02-17 MED ORDER — SODIUM CHLORIDE 0.9 % IV SOLN: 500.0000 mL | Freq: Once | INTRAVENOUS | Status: DC | PRN

## 2015-02-17 MED ORDER — CIPROFLOXACIN IN D5W 400 MG/200ML IV SOLN
400.0000 mg | Freq: Two times a day (BID) | INTRAVENOUS | Status: DC
  Administered 2015-02-17: 400 mg via INTRAVENOUS
  Filled 2015-02-17 (×4): qty 200

## 2015-02-17 MED ORDER — LORAZEPAM 1 MG PO TABS
0.0000 mg | ORAL_TABLET | Freq: Two times a day (BID) | ORAL | Status: DC
Start: 2015-02-19 — End: 2015-02-18

## 2015-02-17 MED ORDER — PANTOPRAZOLE SODIUM 40 MG PO TBEC
40.0000 mg | DELAYED_RELEASE_TABLET | Freq: Every day | ORAL | Status: DC
  Administered 2015-02-18: 40 mg via ORAL
  Filled 2015-02-17 (×2): qty 1

## 2015-02-17 MED ORDER — HEPARIN SODIUM (PORCINE) 1000 UNIT/ML IJ SOLN
INTRAMUSCULAR | Status: AC
  Filled 2015-02-17: qty 1

## 2015-02-17 MED ORDER — PHENYLEPHRINE HCL 10 MG/ML IJ SOLN
10.0000 mg | INTRAVENOUS | Status: DC | PRN
  Administered 2015-02-17: 16.7 ug/min via INTRAVENOUS
  Administered 2015-02-17: 23.3 ug/min via INTRAVENOUS

## 2015-02-17 MED ORDER — DORZOLAMIDE HCL-TIMOLOL MAL 2-0.5 % OP SOLN
1.0000 [drp] | Freq: Two times a day (BID) | OPHTHALMIC | Status: DC
  Administered 2015-02-17 – 2015-02-18 (×2): 1 [drp] via OPHTHALMIC
  Filled 2015-02-17: qty 10

## 2015-02-17 MED ORDER — LORAZEPAM 2 MG/ML IJ SOLN: 1.0000 mg | Freq: Four times a day (QID) | INTRAMUSCULAR | Status: DC | PRN

## 2015-02-17 MED ORDER — LORAZEPAM 1 MG PO TABS: 1.0000 mg | ORAL_TABLET | Freq: Four times a day (QID) | ORAL | Status: DC | PRN

## 2015-02-17 MED ORDER — LORAZEPAM 1 MG PO TABS
0.0000 mg | ORAL_TABLET | Freq: Four times a day (QID) | ORAL | Status: DC
Start: 2015-02-17 — End: 2015-02-18

## 2015-02-17 MED ORDER — PROMETHAZINE HCL 25 MG/ML IJ SOLN: 6.2500 mg | INTRAMUSCULAR | Status: DC | PRN

## 2015-02-17 MED ORDER — BISACODYL 5 MG PO TBEC: 5.0000 mg | DELAYED_RELEASE_TABLET | Freq: Every day | ORAL | Status: DC | PRN

## 2015-02-17 MED ORDER — LATANOPROST 0.005 % OP SOLN
1.0000 [drp] | Freq: Every day | OPHTHALMIC | Status: DC
  Administered 2015-02-17: 1 [drp] via OPHTHALMIC
  Filled 2015-02-17: qty 2.5

## 2015-02-17 MED ORDER — LACTATED RINGERS IV SOLN
INTRAVENOUS | Status: DC | PRN
  Administered 2015-02-17: 07:00:00 via INTRAVENOUS

## 2015-02-17 MED ORDER — DEXTROSE 5 % IV SOLN
1.5000 g | Freq: Two times a day (BID) | INTRAVENOUS | Status: AC
  Administered 2015-02-17 – 2015-02-18 (×2): 1.5 g via INTRAVENOUS
  Filled 2015-02-17 (×2): qty 1.5

## 2015-02-17 MED ORDER — DOCUSATE SODIUM 100 MG PO CAPS
100.0000 mg | ORAL_CAPSULE | Freq: Every day | ORAL | Status: DC
  Administered 2015-02-18: 100 mg via ORAL
  Filled 2015-02-17: qty 1

## 2015-02-17 MED ORDER — MIDAZOLAM HCL 5 MG/5ML IJ SOLN
INTRAMUSCULAR | Status: DC | PRN
  Administered 2015-02-17: 2 mg via INTRAVENOUS

## 2015-02-17 MED ORDER — METOPROLOL TARTRATE 1 MG/ML IV SOLN: 2.0000 mg | INTRAVENOUS | Status: DC | PRN

## 2015-02-17 MED ORDER — ATORVASTATIN CALCIUM 80 MG PO TABS
80.0000 mg | ORAL_TABLET | Freq: Every day | ORAL | Status: DC
  Filled 2015-02-17 (×2): qty 1

## 2015-02-17 MED ORDER — LABETALOL HCL 5 MG/ML IV SOLN: 10.0000 mg | INTRAVENOUS | Status: DC | PRN

## 2015-02-17 MED ORDER — FENTANYL CITRATE (PF) 250 MCG/5ML IJ SOLN
INTRAMUSCULAR | Status: AC
  Filled 2015-02-17: qty 5

## 2015-02-17 MED ORDER — SODIUM CHLORIDE 0.9 % IV SOLN: INTRAVENOUS | Status: DC

## 2015-02-17 MED ORDER — ADULT MULTIVITAMIN W/MINERALS CH
1.0000 | ORAL_TABLET | Freq: Every day | ORAL | Status: DC
  Administered 2015-02-17 – 2015-02-18 (×2): 1 via ORAL
  Filled 2015-02-17 (×2): qty 1

## 2015-02-17 MED ORDER — HEPARIN SODIUM (PORCINE) 1000 UNIT/ML IJ SOLN
INTRAMUSCULAR | Status: DC | PRN
  Administered 2015-02-17: 1500 [IU] via INTRAVENOUS
  Administered 2015-02-17: 5500 [IU] via INTRAVENOUS

## 2015-02-17 MED ORDER — HYDRALAZINE HCL 20 MG/ML IJ SOLN: 5.0000 mg | INTRAMUSCULAR | Status: DC | PRN

## 2015-02-17 MED ORDER — FOLIC ACID 1 MG PO TABS
1.0000 mg | ORAL_TABLET | Freq: Every day | ORAL | Status: DC
  Administered 2015-02-17 – 2015-02-18 (×2): 1 mg via ORAL
  Filled 2015-02-17 (×2): qty 1

## 2015-02-17 MED ORDER — RAMIPRIL 5 MG PO CAPS
5.0000 mg | ORAL_CAPSULE | Freq: Every day | ORAL | Status: DC
  Administered 2015-02-18: 5 mg via ORAL
  Filled 2015-02-17 (×2): qty 1

## 2015-02-17 MED ORDER — MIDAZOLAM HCL 2 MG/2ML IJ SOLN
INTRAMUSCULAR | Status: AC
  Filled 2015-02-17: qty 4

## 2015-02-17 MED ORDER — ALUM & MAG HYDROXIDE-SIMETH 200-200-20 MG/5ML PO SUSP: 15.0000 mL | ORAL | Status: DC | PRN

## 2015-02-17 MED ORDER — THIAMINE HCL 100 MG/ML IJ SOLN
100.0000 mg | Freq: Every day | INTRAMUSCULAR | Status: DC
  Filled 2015-02-17: qty 1

## 2015-02-17 MED ORDER — METOPROLOL SUCCINATE ER 25 MG PO TB24
25.0000 mg | ORAL_TABLET | Freq: Two times a day (BID) | ORAL | Status: DC
  Administered 2015-02-17 – 2015-02-18 (×2): 25 mg via ORAL
  Filled 2015-02-17 (×4): qty 1

## 2015-02-17 SURGICAL SUPPLY — 64 items
BAG DECANTER FOR FLEXI CONT (MISCELLANEOUS) IMPLANT
BALLN CODA OCL 2-9.0-35-120-3 (BALLOONS)
BALLOON COD OCL 2-9.0-35-120-3 (BALLOONS) IMPLANT
CANISTER SUCTION 2500CC (MISCELLANEOUS) ×4 IMPLANT
CANNULA VESSEL 3MM 2 BLNT TIP (CANNULA) ×8 IMPLANT
CATH EMB 3FR 80CM (CATHETERS) ×8 IMPLANT
CLIP TI MEDIUM 24 (CLIP) ×4 IMPLANT
CLIP TI WIDE RED SMALL 24 (CLIP) ×4 IMPLANT
COVER MAYO STAND STRL (DRAPES) ×4 IMPLANT
DRAIN CHANNEL 10F 3/8 F FF (DRAIN) IMPLANT
DRAIN CHANNEL 10M FLAT 3/4 FLT (DRAIN) IMPLANT
DRAIN CHANNEL 15F RND FF W/TCR (WOUND CARE) IMPLANT
DRAPE TABLE COVER HEAVY DUTY (DRAPES) ×4 IMPLANT
DRSG COVADERM 4X8 (GAUZE/BANDAGES/DRESSINGS) IMPLANT
ELECT CAUTERY BLADE 6.4 (BLADE) ×4 IMPLANT
ELECT REM PT RETURN 9FT ADLT (ELECTROSURGICAL) ×8
ELECTRODE REM PT RTRN 9FT ADLT (ELECTROSURGICAL) ×4 IMPLANT
EVACUATOR 3/16  PVC DRAIN (DRAIN)
EVACUATOR 3/16 PVC DRAIN (DRAIN) IMPLANT
EVACUATOR SILICONE 100CC (DRAIN) IMPLANT
GLOVE BIO SURGEON STRL SZ7.5 (GLOVE) ×4 IMPLANT
GLOVE BIOGEL PI IND STRL 6.5 (GLOVE) ×2 IMPLANT
GLOVE BIOGEL PI IND STRL 7.5 (GLOVE) ×2 IMPLANT
GLOVE BIOGEL PI IND STRL 8 (GLOVE) ×2 IMPLANT
GLOVE BIOGEL PI INDICATOR 6.5 (GLOVE) ×2
GLOVE BIOGEL PI INDICATOR 7.5 (GLOVE) ×2
GLOVE BIOGEL PI INDICATOR 8 (GLOVE) ×2
GLOVE ECLIPSE 6.5 STRL STRAW (GLOVE) ×4 IMPLANT
GLOVE ECLIPSE 7.0 STRL STRAW (GLOVE) ×4 IMPLANT
GOWN STRL REUS W/ TWL LRG LVL3 (GOWN DISPOSABLE) ×6 IMPLANT
GOWN STRL REUS W/TWL LRG LVL3 (GOWN DISPOSABLE) ×6
INSERT FOGARTY 61MM (MISCELLANEOUS) IMPLANT
INSERT FOGARTY SM (MISCELLANEOUS) IMPLANT
KIT BASIN OR (CUSTOM PROCEDURE TRAY) ×4 IMPLANT
KIT ROOM TURNOVER OR (KITS) ×4 IMPLANT
LIQUID BAND (GAUZE/BANDAGES/DRESSINGS) ×8 IMPLANT
LOOP VESSEL MINI RED (MISCELLANEOUS) ×4 IMPLANT
NEEDLE PERC 18GX7CM (NEEDLE) ×4 IMPLANT
NS IRRIG 1000ML POUR BTL (IV SOLUTION) ×8 IMPLANT
PACK ENDOVASCULAR (PACKS) IMPLANT
PACK PERIPHERAL VASCULAR (CUSTOM PROCEDURE TRAY) ×4 IMPLANT
PAD ARMBOARD 7.5X6 YLW CONV (MISCELLANEOUS) ×8 IMPLANT
PENCIL BUTTON HOLSTER BLD 10FT (ELECTRODE) IMPLANT
SPONGE SURGIFOAM ABS GEL 100 (HEMOSTASIS) IMPLANT
STAPLER VISISTAT (STAPLE) IMPLANT
STOPCOCK MORSE 400PSI 3WAY (MISCELLANEOUS) IMPLANT
SUT PROLENE 5 0 C 1 24 (SUTURE) ×16 IMPLANT
SUT PROLENE 5 0 C 1 36 (SUTURE) ×4 IMPLANT
SUT PROLENE 6 0 BV (SUTURE) ×8 IMPLANT
SUT SILK 3 0 (SUTURE) ×2
SUT SILK 3-0 18XBRD TIE 12 (SUTURE) ×2 IMPLANT
SUT VIC AB 2-0 CTB1 (SUTURE) ×4 IMPLANT
SUT VIC AB 3-0 SH 27 (SUTURE) ×4
SUT VIC AB 3-0 SH 27X BRD (SUTURE) ×4 IMPLANT
SUT VICRYL 4-0 PS2 18IN ABS (SUTURE) ×8 IMPLANT
SYR 20CC LL (SYRINGE) ×8 IMPLANT
SYR 30ML LL (SYRINGE) IMPLANT
SYR MEDRAD MARK V 150ML (SYRINGE) IMPLANT
SYR TB 1ML LUER SLIP (SYRINGE) ×4 IMPLANT
SYRINGE 10CC LL (SYRINGE) ×12 IMPLANT
TRAY FOLEY W/METER SILVER 16FR (SET/KITS/TRAYS/PACK) ×8 IMPLANT
TUBING HIGH PRESSURE 120CM (CONNECTOR) IMPLANT
UNDERPAD 30X30 INCONTINENT (UNDERPADS AND DIAPERS) ×4 IMPLANT
WATER STERILE IRR 1000ML POUR (IV SOLUTION) ×4 IMPLANT

## 2015-02-17 NOTE — Op Note (Signed)
NAME: Rodney [name]   MRN: [id number] DOB: [birth year]    DATE OF OPERATION: 02/17/2015  PREOP DIAGNOSIS: atherosclerosis with rest pain left lower extremity  POSTOP DIAGNOSIS: same  PROCEDURE: Extensive left external iliac artery and common femoral artery endarterectomy with vein patch angioplasty  SURGEON: Judeth Cornfield. Scot Dock, MD, FACS  ASSIST: Victorino Dike, MD Gerri Lins PA  ANESTHESIA: Gen.   EBL: minimal  INDICATIONS: Rodney [name] is a 64 y.o. male who was having intermittent paresthesias and rest pain in the left leg. Arteriogram demonstrated a severe left common femoral artery stenosis. He presents for endarterectomy and patch angioplasty. Of note he had evidence of a urinary tract infection preoperatively and vein was used to patch the artery. I did not want to consider placement of a covered stent for a possible iliac stenosis, however, regardless the patient had excellent inflow and I agree that the stenosis did not appear to be flow limiting.  FINDINGS: extensive left common femoral artery plaque with some recent thrombus  TECHNIQUE: The patient was taken to the operating room and received a general anesthetic. The left groin and left lower extremity were prepped and draped in usual sterile fashion. A longitudinal incision was made in the left groin. The common femoral, deep femoral, and superficial femoral arteries were dissected free.there was extensive calcific disease. I had to extend the incision proximally and dissected well under the inguinal ligament in order to find an area of the external iliac artery that I could clamp. Multiple side branches were controlled with vessel loops. The deep femoral artery had calcium proximally I dissected down to the secondary branches for control. The superficial femoral artery had calcium proximally but I was able to get beyond this for a soft spot to clamp. The patient was heparinized. This incision and a separate longitudinal  incision along the medial aspect of the left thigh the greater saphenous vein was harvested from the saphenofemoral junction to the proximal thigh. Branches were divided between clips and 3-0 silk ties. The vein was ligated proximally and distally and excised. It was irrigated with heparinized saline and opened longitudinally.  The external iliac artery was then clamped after the patient had been heparinized. The superficial femoral and deep femoral arteries were controlled as were the side branches. A longitudinal arteriotomy was made in the common femoral artery and extended up into the external iliac artery. An endarterectomy plane was established and the plaque was endarterectomized extending well up into the external iliac artery. Distally I initially tried to in the plaque where the artery was patent however there was too much calcium to sew at this level and therefore had to extend the endarterectomy down onto the superficial femoral artery. Was able to get the plaque out of the proximal superficial femoral artery and also the deep femoral artery. The artery was irrigated with cups amounts of heparin and all loose debris removed. The long vein patch was then sewn using continuous 5-0 proline suture. Prior to completing the anastomosis I did pass a 3 Fogarty catheter the entire length without any clot retrieved. The anastomosis was completed and at this point there was an excellent anterior tibial and peroneal signals Doppler. Hemostasis was obtained in the wound. The vein harvest site in the proximal thigh was closed with 3-0 Vicryl and the skin closed with 4-0 Vicryl. Sterile dressing was applied. The patient tolerated the procedure well and was transferred to the recovery room in stable condition. All needle and sponge counts  were correct.  Deitra Mayo, MD, FACS [facility]  DATE OF DICTATION:   02/17/2015

## 2015-02-17 NOTE — Anesthesia Preprocedure Evaluation (Addendum)
Anesthesia Evaluation  Patient identified by MRN, date of birth, ID band Patient awake    Reviewed: Allergy & Precautions, NPO status , Patient's Chart, lab work & pertinent test results  Airway Mallampati: II  TM Distance: >3 FB Neck ROM: Full    Dental no notable dental hx. (+) Teeth Intact, Dental Advidsory Given   Pulmonary Current Smoker,    Pulmonary exam normal breath sounds clear to auscultation       Cardiovascular Exercise Tolerance: Poor hypertension, Pt. on medications and Pt. 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), + Cardiac Stents and + Peripheral Vascular Disease  Normal cardiovascular exam Rhythm:Regular Rate:Normal     Neuro/Psych negative neurological ROS  negative psych ROS   GI/Hepatic negative GI ROS, Neg liver ROS, (+)     substance abuse (drinks 6-8 beers daily....consider risk for DTs)  alcohol use,   Endo/Other  negative endocrine ROS  Renal/GU negative Renal ROS  negative genitourinary   Musculoskeletal negative musculoskeletal ROS (+)   Abdominal   Peds negative pediatric ROS (+)  Hematology negative hematology ROS (+) anemia ,   Anesthesia Other Findings Rodney [name] has a generalized rash over his entire body with particular area of concentrated rash at what appear to be exposed skin surfaces from his clothing.  The rash is a flat reddened, with some areas of scab formation presumably from scratching.  To my eyes it appears to be flea bites.  The patient does have 3 cats that share his home.  The patient denies these are flea bites.  He plans to see a dermatologist after his leg is better.  I will ask about the possibility of having derm see while he is an inpatient.  Reproductive/Obstetrics negative OB ROS                          Anesthesia Physical Anesthesia Plan  ASA: III  Anesthesia Plan: General and General ETT   Post-op Pain  Management:    Induction: Intravenous  Airway Management Planned: Oral ETT  Additional Equipment:   Intra-op Plan:   Post-operative Plan: Extubation in OR  Informed Consent: I have reviewed the patients History and Physical, chart, labs and discussed the procedure including the risks, benefits and alternatives for the proposed anesthesia with the patient or authorized representative who has indicated his/her understanding and acceptance.   Dental advisory given  Plan Discussed with: CRNA and Surgeon  Anesthesia Plan Comments:        Anesthesia Quick Evaluation

## 2015-02-17 NOTE — Progress Notes (Signed)
Family updated per volunteer.

## 2015-02-17 NOTE — Progress Notes (Signed)
Received report from [place]

## 2015-02-17 NOTE — Anesthesia Postprocedure Evaluation (Signed)
  Anesthesia Post-op Note  Patient: Rodney [name]  Procedure(s) Performed: Procedure(s) (LRB): LEFT FEMORAl ENDARTERECTOMY  (Left) VEIN PATCH ANGIOPLASTY (Left)  Patient Location: PACU  Anesthesia Type: General  Level of Consciousness: awake and alert   Airway and Oxygen Therapy: Patient Spontanous Breathing  Post-op Pain: mild  Post-op Assessment: Post-op Vital signs reviewed, Patient's Cardiovascular Status Stable, Respiratory Function Stable, Patent Airway and No signs of Nausea or vomiting  Last Vitals:  Filed Vitals:   02/17/15 1201  BP: 120/69  Pulse: 62  Temp:   Resp: 11    Post-op Vital Signs: stable   Complications: No apparent anesthesia complications

## 2015-02-17 NOTE — Anesthesia Procedure Notes (Signed)
Procedure Name: Intubation Date/Time: 02/17/2015 7:42 AM Performed by: Duke Salvia Pre-anesthesia Checklist: Patient identified, Emergency Drugs available, Suction available and Patient being monitored Patient Re-evaluated:Patient Re-evaluated prior to inductionOxygen Delivery Method: Circle system utilized Preoxygenation: Pre-oxygenation with 100% oxygen Intubation Type: IV induction Ventilation: Mask ventilation without difficulty Laryngoscope Size: Mac and 4 Grade View: Grade I Number of attempts: 1 Secured at: 22 cm Tube secured with: Tape Dental Injury: Teeth and Oropharynx as per pre-operative assessment  Comments: White flat lesion noted on anterior right lateral surface of epiglottis....informed both Dr. Kalman Shan and Dr. Scot Dock

## 2015-02-17 NOTE — Progress Notes (Signed)
      Patient alert and oriented wants to eat. Doppler DP and peroneal left LE Left groin incision soft without hematoma  S/P LEFT FEMORAl ENDARTERECTOMY (Left) VEIN PATCH ANGIOPLASTY (Left)  COLLINS, EMMA MAUREEN PA-C

## 2015-02-17 NOTE — Transfer of Care (Signed)
Immediate Anesthesia Transfer of Care Note  Patient: Rodney [name]  Procedure(s) Performed: Procedure(s): LEFT FEMORAl ENDARTERECTOMY  (Left) VEIN PATCH ANGIOPLASTY (Left)  Patient Location: PACU  Anesthesia Type:General  Level of Consciousness: awake, alert , oriented and patient cooperative  Airway & Oxygen Therapy: Patient Spontanous Breathing  Post-op Assessment: Report given to RN, Post -op Vital signs reviewed and stable and Patient moving all extremities  Post vital signs: Reviewed and stable  Last Vitals:  Filed Vitals:   02/17/15 0430  BP: 135/86  Pulse: 58  Temp: 36.7 C  Resp: 18    Complications: No apparent anesthesia complications

## 2015-02-17 NOTE — Interval H&P Note (Signed)
History and Physical Interval Note:  02/17/2015 6:17 AM  Rodney [name]  has presented today for surgery, with the diagnosis of Ischemic Left leg  The various methods of treatment have been discussed with the patient and family. After consideration of risks, benefits and other options for treatment, the patient has consented to  Procedure(s): ENDARTERECTOMY FEMORAL (Left) LEFT FEMORAL STENT GRAFT INSERTION (Left) as a surgical intervention .  The patient's history has been reviewed, patient examined, no change in status, stable for surgery.  I have reviewed the patient's chart and labs.  Questions were answered to the patient's satisfaction.     Deitra Mayo

## 2015-02-18 LAB — BASIC METABOLIC PANEL
ANION GAP: 7 (ref 5–15)
BUN: 7 mg/dL (ref 6–20)
CO2: 28 mmol/L (ref 22–32)
Calcium: 8.3 mg/dL — ABNORMAL LOW (ref 8.9–10.3)
Chloride: 96 mmol/L — ABNORMAL LOW (ref 101–111)
Creatinine, Ser: 0.92 mg/dL (ref 0.61–1.24)
GFR calc Af Amer: >60 mL/min (ref >60)
GLUCOSE: 134 mg/dL — AB (ref 65–99)
POTASSIUM: 4 mmol/L (ref 3.5–5.1)
Sodium: 131 mmol/L — ABNORMAL LOW (ref 135–145)

## 2015-02-18 LAB — CBC
HEMATOCRIT: 32.6 % — AB (ref 39.0–52.0)
Hemoglobin: 10.7 g/dL — ABNORMAL LOW (ref 13.0–17.0)
MCH: 32.2 pg (ref 26.0–34.0)
MCHC: 32.8 g/dL (ref 30.0–36.0)
MCV: 98.2 fL (ref 78.0–100.0)
PLATELETS: 236 10*3/uL (ref 150–400)
RBC: 3.32 MIL/uL — AB (ref 4.22–5.81)
RDW: 13.6 % (ref 11.5–15.5)
WBC: 8.8 10*3/uL (ref 4.0–10.5)

## 2015-02-18 MED ORDER — CIPROFLOXACIN HCL 500 MG PO TABS: 500.0000 mg | ORAL_TABLET | Freq: Two times a day (BID) | ORAL | Status: DC

## 2015-02-18 MED ORDER — OXYCODONE HCL 5 MG PO TABS: 5.0000 mg | ORAL_TABLET | ORAL | Status: DC | PRN

## 2015-02-18 NOTE — Plan of Care (Signed)
Problem: Phase II Progression Outcomes Goal: Foley discontinued Outcome: Completed/Met Date Met:  02/18/15 Able to void post Foley D/C

## 2015-02-18 NOTE — Progress Notes (Signed)
    Subjective  - POD #1  Ambulated on his own. Voiding spontaneously Tolerating oral intake   Physical Exam:  Excellent posterior tibial Doppler signal Left groin incision healing nicely       Assessment/Plan:  POD #1  UTI: Currently taking Cipro Anticipate discharge to home later today  Annamarie Major 02/18/2015 7:11 AM --  Filed Vitals:   02/18/15 0400  BP: 119/59  Pulse: 68  Temp: 97.8 F (36.6 C)  Resp: 16    Intake/Output Summary (Last 24 hours) at 02/18/15 0711 Last data filed at 02/18/15 0400  Gross per 24 hour  Intake   3210 ml  Output   2020 ml  Net   1190 ml     Laboratory CBC    Component Value Date/Time   WBC 8.8 02/18/2015 0407   HGB 10.7* 02/18/2015 0407   HCT 32.6* 02/18/2015 0407   PLT 236 02/18/2015 0407    BMET    Component Value Date/Time   NA 131* 02/18/2015 0407   K 4.0 02/18/2015 0407   CL 96* 02/18/2015 0407   CO2 28 02/18/2015 0407   GLUCOSE 134* 02/18/2015 0407   BUN 7 02/18/2015 0407   CREATININE 0.92 02/18/2015 0407   CALCIUM 8.3* 02/18/2015 0407   GFRNONAA >60 02/18/2015 0407   GFRAA >60 02/18/2015 0407    COAG Lab Results  Component Value Date   INR 0.94 02/16/2015   INR 0.91 02/09/2015   INR 0.88 02/25/2012   No results found for: PTT  Antibiotics Anti-infectives    Start     Dose/Rate Route Frequency Ordered Stop   02/17/15 1930  ciprofloxacin (CIPRO) IVPB 400 mg     400 mg 200 mL/hr over 60 Minutes Intravenous Every 12 hours 02/17/15 1518     02/17/15 1830  cefUROXime (ZINACEF) 1.5 g in dextrose 5 % 50 mL IVPB     1.5 g 100 mL/hr over 30 Minutes Intravenous Every 12 hours 02/17/15 1518 02/18/15 1829   02/17/15 0800  ciprofloxacin (CIPRO) IVPB 400 mg  Status:  Discontinued     400 mg 200 mL/hr over 60 Minutes Intravenous To Surgery 02/17/15 0749 02/17/15 1625   02/17/15 0500  ceFAZolin (ANCEF) IVPB 1 g/50 mL premix    Comments:  Send with pt to OR   1 g 100 mL/hr over 30 Minutes Intravenous  To Short Stay 02/16/15 2027 02/18/15 0500   02/16/15 1915  cefUROXime (ZINACEF) 1.5 g in dextrose 5 % 50 mL IVPB     1.5 g 100 mL/hr over 30 Minutes Intravenous To Surgery 02/16/15 1908 02/17/15 0715       V. Leia Alf, M.D. [facility] [place] Office: [phone] Pager:  [phone]

## 2015-02-18 NOTE — Progress Notes (Signed)
Ambulated in hallway 600 ft. Tolerated. Discharged home in stable condition; self care.

## 2015-02-20 ENCOUNTER — Telehealth: Payer: Self-pay | Admitting: Vascular Surgery

## 2015-02-20 ENCOUNTER — Encounter (HOSPITAL_COMMUNITY): Payer: Self-pay | Admitting: Vascular Surgery

## 2015-02-20 LAB — URINE CULTURE: Culture: >=100000

## 2015-02-20 NOTE — Telephone Encounter (Signed)
-----   Message from Mena Goes, RN sent at 02/20/2015  8:46 AM EDT ----- Regarding: schedule   ----- Message -----    From: Ulyses Amor, PA-C    Sent: 02/18/2015   7:58 AM      To: [facility] Charge Pool  F/U with Dr. Scot Dock in 2 weeks s/p left femoral endar

## 2015-02-20 NOTE — Telephone Encounter (Signed)
Spoke with pt to schedule, dpm °

## 2015-02-23 ENCOUNTER — Inpatient Hospital Stay (HOSPITAL_COMMUNITY): Admission: RE | Admit: 2015-02-23 | Payer: 59 | Source: Ambulatory Visit | Admitting: Vascular Surgery

## 2015-02-23 ENCOUNTER — Encounter (HOSPITAL_COMMUNITY): Admission: RE | Payer: Self-pay | Source: Ambulatory Visit

## 2015-02-23 SURGERY — ENDARTERECTOMY, FEMORAL
Anesthesia: General | Laterality: Left

## 2015-02-24 ENCOUNTER — Telehealth: Payer: Self-pay | Admitting: *Deleted

## 2015-02-24 NOTE — Telephone Encounter (Signed)
Returned patient's call.  Patient states that his left leg is swollen but he is not experiencing any pain.  Patient denies fever, warmth or redness.  Patient states that his right foot and ankle are slightly swollen.  I asked patient if he had been elevating his leg and he states that he forgets to do this a lot of the time.  I explained that proper elevation would be to place pillows under his left leg to have it above the level of his heart and to place at least one pillow under his right leg. Also suggested to patient to either walk or to lie down that sitting would increase the swelling. Patient is out shopping as I am giving him these instructions.  Patient voiced understanding of the instructions but stated that to him this seemed like this would be uncomfortable.  I explained that if his symptoms should worsen over the weekend to call our office and the answering service would notify the physician on call.

## 2015-03-06 ENCOUNTER — Encounter: Payer: Self-pay | Admitting: Vascular Surgery

## 2015-03-06 NOTE — Discharge Summary (Signed)
[facility] Discharge Summary   Patient ID:  Rodney [name] MRN: [id number] DOB/AGE: Dec 15, 1950 64 y.o.  Admit date: 02/16/2015 Discharge date: 02/18/2015 Date of Surgery: 02/16/2015 - 02/17/2015 Surgeon: Surgeon(s): Angelia Mould, MD Mal Misty, MD  Admission Diagnosis: Ischemic left lower extremity with increased pain and numbness Ischemic Left leg  Discharge Diagnoses:  Ischemic left lower extremity with increased pain and numbness Ischemic Left leg  Secondary Diagnoses: Past Medical History  Diagnosis Date  . Prostate cancer 05/03/2009    Prostatectomy/Adenocrcinoma  . Heart murmur     history  . Cataract     left eye surgery/   . Radiation 07/02/11-08/22/11    Prostate fossa 68.4 gray 38 fractions    Procedure(s): LEFT FEMORAl ENDARTERECTOMY  VEIN PATCH ANGIOPLASTY  Discharged Condition: good  HPI: Rodney [name] is a 64 y.o. male Who I originally saw in consultation on 02/09/2015 with sudden onset of paresthesias on the left leg. Prior to this event he did admit to some bilateral calf claudication. His risk factors for vascular disease included a A 2 pack per day smoking history for 35 years although he quit 7 years ago, hypertension and hypercholesterolemia. He underwent an arteriogram which showed a severe left common femoral artery stenosis. His arteries were small with some diffuse disease and he had two-vessel runoff on the left knee and the anterior tibial. Arteries. It was also some plaque in the left common iliac artery although this was not felt to be flow limiting. As there was no evidence of an acute embolus or acute occlusion he was set up for elective left common femoral artery endarterectomy. However, he has been having intermittent episodes of paresthesias in the left lower extremity and was concerned so was sent to the hospital for evaluation and possible surgery earlier than planned.  He has been having intermittent episodes of  weakness and pain in the left lower extremity which resolve. He states that the leg gets "ice cold" and then this improves. He is not aware of any episodes of hypotension associated with this. He denies any associated symptoms  AORTOGRAM WITH RUNOFF: I have reviewed his aortogram with runoff which was performed by Dr. Ruta Hinds. This showed that his aorta in bilateral common iliac arteries were patent. On the left side which is the site of concern, there is some plaque in the common iliac artery although it was not felt to be flow limiting. There was severe stenosis of the left common femoral artery. The superficial femoral artery and deep femoral arteries were patent and was a focal calcific plaque in the distal superficial femoral artery which was not flow limiting. The posterior tibial artery appeared chronically occluded. There was two-vessel runoff on the left via the anterior tibial and peroneal arteries. On the left side he had some disease in the common femoral artery also external iliac artery with a patent superficial femoral artery and deep femoral artery. He appeared to have 2 vessel runoff on the right also via the anterior tibial arteries.  Dr. Scot Dock placed him on heparin tonight and will proceed first thing in the morning. His surgery had originally been scheduled for next week however given that he is having recurrent symptoms I feel that it would be safest to proceed sooner.  Hospital Course:  Rodney [name] is a 64 y.o. male is S/P Procedure(s): LEFT FEMORAl ENDARTERECTOMY  VEIN PATCH ANGIOPLASTY Excellent posterior tibial Doppler signal Left groin incision healing nicely UTI he will  be discharged home on PO Ciprofloxin.      Significant Diagnostic Studies: CBC Lab Results  Component Value Date   WBC 8.8 02/18/2015   HGB 10.7* 02/18/2015   HCT 32.6* 02/18/2015   MCV 98.2 02/18/2015   PLT 236 02/18/2015    BMET    Component Value Date/Time   NA 131* 02/18/2015  0407   K 4.0 02/18/2015 0407   CL 96* 02/18/2015 0407   CO2 28 02/18/2015 0407   GLUCOSE 134* 02/18/2015 0407   BUN 7 02/18/2015 0407   CREATININE 0.92 02/18/2015 0407   CALCIUM 8.3* 02/18/2015 0407   GFRNONAA >60 02/18/2015 0407   GFRAA >60 02/18/2015 0407   COAG Lab Results  Component Value Date   INR 0.94 02/16/2015   INR 0.91 02/09/2015   INR 0.88 02/25/2012     Disposition:  Discharge to :Home    Medication List    TAKE these medications        aspirin 81 MG EC tablet  Take 1 tablet (81 mg total) by mouth daily.     atorvastatin 80 MG tablet  Commonly known as:  LIPITOR  Take 1 tablet (80 mg total) by mouth daily at 6 PM.     ciprofloxacin 500 MG tablet  Commonly known as:  CIPRO  Take 1 tablet (500 mg total) by mouth 2 (two) times daily.     ciprofloxacin 500 MG tablet  Commonly known as:  CIPRO  Take 1 tablet (500 mg total) by mouth 2 (two) times daily.     dorzolamide-timolol 22.3-6.8 MG/ML ophthalmic solution  Commonly known as:  COSOPT  Place 1 drop into both eyes 2 (two) times daily.     latanoprost 0.005 % ophthalmic solution  Commonly known as:  XALATAN  Place 1 drop into both eyes at bedtime.     metoprolol succinate 25 MG 24 hr tablet  Commonly known as:  TOPROL-XL  Take 25 mg by mouth 2 (two) times daily.     nitroGLYCERIN 0.4 MG SL tablet  Commonly known as:  NITROSTAT  Place 1 tablet (0.4 mg total) under the tongue every 5 (five) minutes x 3 doses as needed for chest pain.     oxyCODONE 5 MG immediate release tablet  Commonly known as:  ROXICODONE  Take 1 tablet (5 mg total) by mouth every 4 (four) hours as needed for severe pain.     ramipril 5 MG capsule  Commonly known as:  ALTACE  Take 5 mg by mouth daily.       Verbal and written Discharge instructions given to the patient. Wound care per Discharge AVS     Follow-up Information    Follow up with Deitra Mayo, MD In 2 weeks.   Specialties:  Vascular Surgery,  Cardiology   Why:  Office will call you to arrange your appt (sent)   Contact information:   [place] [id number] [phone]       Signed: Laurence Slate [facility] 03/06/2015, 10:50 AM  - For VQI Registry use --- Instructions: Press F2 to tab through selections.  Delete question if not applicable.   Post-op:  Wound infection: No  Graft infection: No  Transfusion: No  If yes,  units given New Arrhythmia: No Ipsilateral amputation: [ x] no, [ ]  Minor, [ ]  BKA, [ ]  AKA Discharge patency: [ x] Primary, [ ]  Primary assisted, [ ]  Secondary, [ ]  Occluded Patency judged by: [x ] Dopper only, [ ]  Palpable  graft pulse, [ ]  Palpable distal pulse, [ ]  ABI inc. > 0.15, [ ]  Duplex  D/C Ambulatory Status: Ambulatory  Complications: MI: [x ] No, [ ]  Troponin only, [ ]  EKG or Clinical CHF: No Resp failure: [ x] none, [ ]  Pneumonia, [ ]  Ventilator Chg in renal function: [x ] none, [ ]  Inc. Cr > 0.5, [ ]  Temp. Dialysis, [ ]  Permanent dialysis Stroke: [x ] None, [ ]  Minor, [ ]  Major Return to OR: No  Reason for return to OR: [ ]  Bleeding, [ ]  Infection, [ ]  Thrombosis, [ ]  Revision  Discharge medications: Statin use:  Yes ASA use:  Yes Plavix use:  No  for medical reason   Beta blocker use: No  for medical reason   Coumadin use: No  for medical reason

## 2015-03-08 ENCOUNTER — Encounter: Payer: Self-pay | Admitting: Vascular Surgery

## 2015-03-08 ENCOUNTER — Ambulatory Visit (INDEPENDENT_AMBULATORY_CARE_PROVIDER_SITE_OTHER): Payer: 59 | Admitting: Vascular Surgery

## 2015-03-08 VITALS — BP 144/84 | HR 74 | Temp 98.4°F | Ht 68.0 in | Wt 132.4 lb

## 2015-03-08 DIAGNOSIS — Z48812 Encounter for surgical aftercare following surgery on the circulatory system: Secondary | ICD-10-CM

## 2015-03-08 NOTE — Progress Notes (Signed)
Patient name: Rodney [name] MRN: [id number] DOB: 11/12/1950 Sex: male  REASON FOR VISIT: Follow up after left external iliac artery and common femoral endarterectomy with vein patching plasty.  HPI: Rodney [name] is a 64 y.o. male who is having intermittent paresthesias and rest pain in the left leg. Arteriogram demonstrated a severe left common femoral artery stenosis and he presented for endarterectomy and patch angioplasty. He underwent extensive left external iliac artery and common femoral artery endarterectomy with vein patch angioplasty on 02/17/2015. He comes in for routine follow up visit.  He has no specific complaint except for a rash that is being treated by his dermatologist involves both groins. His left foot feels much better.  Current Outpatient Prescriptions  Medication Sig Dispense Refill  . aspirin EC 81 MG EC tablet Take 1 tablet (81 mg total) by mouth daily. 30 tablet 3  . atorvastatin (LIPITOR) 80 MG tablet Take 1 tablet (80 mg total) by mouth daily at 6 PM. 30 tablet 3  . dorzolamide-timolol (COSOPT) 22.3-6.8 MG/ML ophthalmic solution Place 1 drop into both eyes 2 (two) times daily.    Marland Kitchen latanoprost (XALATAN) 0.005 % ophthalmic solution Place 1 drop into both eyes at bedtime.    . metoprolol succinate (TOPROL-XL) 25 MG 24 hr tablet Take 25 mg by mouth 2 (two) times daily.    . nitroGLYCERIN (NITROSTAT) 0.4 MG SL tablet Place 1 tablet (0.4 mg total) under the tongue every 5 (five) minutes x 3 doses as needed for chest pain. 25 tablet 3  . oxyCODONE (ROXICODONE) 5 MG immediate release tablet Take 1 tablet (5 mg total) by mouth every 4 (four) hours as needed for severe pain. 30 tablet 0  . predniSONE (DELTASONE) 20 MG tablet TAKE 3 TABS EVERY MORNING X 3 DAYS, THEN 2 TABS X 4 DAYS, THEN 1 TAB X5 DAYS, THEN 1/2 TAB X 5 DAYS  0  . ramipril (ALTACE) 5 MG capsule Take 5 mg by mouth daily.    . ciprofloxacin (CIPRO) 500 MG tablet Take 1 tablet (500 mg total) by mouth 2 (two)  times daily. (Patient not taking: Reported on 03/08/2015) 20 tablet 0  . ciprofloxacin (CIPRO) 500 MG tablet Take 1 tablet (500 mg total) by mouth 2 (two) times daily. (Patient not taking: Reported on 03/08/2015) 20 tablet 0   No current facility-administered medications for this visit.   REVIEW OF SYSTEMS: Valu.Nieves ] denotes positive finding; [  ] denotes negative finding  CARDIOVASCULAR:  [ ]  chest pain   [ ]  dyspnea on exertion    CONSTITUTIONAL:  [ ]  fever   [ ]  chills  PHYSICAL EXAM: Filed Vitals:   03/08/15 1043 03/08/15 1045  BP: 148/106 144/84  Pulse: 74   Temp: 98.4 F (36.9 C)   TempSrc: Oral   Height: 5\' 8"  (1.727 m)   Weight: 132 lb 6.4 oz (60.056 kg)   SpO2: 100%    GENERAL: The patient is a well-nourished male, in no acute distress. The vital signs are documented above. CARDIOVASCULAR: There is a regular rate and rhythm. PULMONARY: There is good air exchange bilaterally without wheezing or rales. His incision in the left groin is healing well except for the rash is noted. 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  MEDICAL ISSUES:  PERIPHERAL VASCULAR DISEASE: The patient is doing well status post extensive left external iliac artery and common femoral artery endarterectomy with vein patch angioplasty. I ordered follow up ABIs in 6 months and I'll  see him back at that time. He knows to call sooner if he has problems. If the lymphocele enlarges we may need to aspirate that. He did have some plaque in the right common femoral artery and if he becomes more symptomatic on the right he may need to be considered for endarterectomy on the right in the future.  HYPERTENSION: The patient's initial blood pressure today was elevated. We repeated this and this was still elevated. We have encouraged the patient to follow up with their primary care physician for management of their blood pressure.   Deitra Mayo [facility] [place]:  [phone]

## 2015-03-08 NOTE — Addendum Note (Signed)
Addended by: Dorthula Rue L on: 03/08/2015 02:04 PM   Modules accepted: Orders

## 2015-05-23 NOTE — Discharge Summary (Signed)
Patient ID: Rodney [name] MRN: [id number] DOB/AGE: [birth year] 64 y.o.  Admit date: 02/09/2015 Discharge date: 02/10/15  Admission Diagnosis: Left leg pain [M79.605] Leg pain, left [M79.605]  Discharge Diagnoses:  Left leg pain [M79.605] Leg pain, left [M79.605]  Secondary Diagnoses: Past Medical History  Diagnosis Date  . Prostate cancer ([place]) 05/03/2009    Prostatectomy/Adenocrcinoma  . Heart murmur     history  . Cataract     left eye surgery/   . Radiation 07/02/11-08/22/11    Prostate fossa 68.4 gray 38 fractions    Procedures: 02/09/2015 - 02/10/2015 Surgeon(s): Elam Dutch, MD Procedure(s): Abdominal Aortogram Lower Extremity Angiography Both  Discharged Condition: good  HPI: Rodney [name] is a 64 y.o. male who noted the gradual onset of paresthesias in his left leg and proximally 3 PM on the day of admission. This started in his left foot and gradually progressed to involve the entire leg. He denies significant pain. He states that his symptoms have improved over the last 2 hours. He was seen in [facility] and then transferred here.  Prior to this event, he did admit to some bilateral calf claudication when he was walking uphill. The pain was brought on by ambulation and relieved with rest. He denies any history of rest pain or history of nonhealing ulcers.  Hospital Course: He underwent an arteriogram on 02/10/15 by Dr. Oneida Alar. This showed:  "In the right lower extremity, the right common femoral artery as noted above has an 80% stenosis. The profunda femoris and superficial femoral arteries are patent. The right popliteal artery is patent. There is two-vessel runoff to the right foot via the peroneal and posterior tibial arteries. The anterior tibial artery is occluded.  In the left lower extremity, there are similar views as to the right. There is two-vessel runoff via the peroneal and posterior tibial artery. There is no evidence of embolic phenomenon. All of the  occlusive disease appears chronic."  He was discharge on 02/10/15 and scheduled for elective left EIA and CFA endarterectomy.   Consults:  Treatment Team:  Angelia Mould, MD  Significant Diagnostic Studies:  No results found.  CBC    Component Value Date/Time   WBC 8.8 02/18/2015 0407   RBC 3.32* 02/18/2015 0407   HGB 10.7* 02/18/2015 0407   HCT 32.6* 02/18/2015 0407   PLT 236 02/18/2015 0407   MCV 98.2 02/18/2015 0407   MCH 32.2 02/18/2015 0407   MCHC 32.8 02/18/2015 0407   RDW 13.6 02/18/2015 0407   LYMPHSABS 0.5* 02/09/2015 1700   MONOABS 1.0 02/09/2015 1700   EOSABS 0.1 02/09/2015 1700   BASOSABS 0.1 02/09/2015 1700    BMET    Component Value Date/Time   NA 131* 02/18/2015 0407   K 4.0 02/18/2015 0407   CL 96* 02/18/2015 0407   CO2 28 02/18/2015 0407   GLUCOSE 134* 02/18/2015 0407   BUN 7 02/18/2015 0407   CREATININE 0.92 02/18/2015 0407   CALCIUM 8.3* 02/18/2015 0407   GFRNONAA >60 02/18/2015 0407   GFRAA >60 02/18/2015 0407    COAG Lab Results  Component Value Date   INR 0.94 02/16/2015   INR 0.91 02/09/2015   INR 0.88 02/25/2012   No results found for: PTT  Disposition: 01-Home or Self Care     Medication List    TAKE these medications        aspirin 81 MG EC tablet  Take 1 tablet (81 mg total) by mouth daily.  atorvastatin 80 MG tablet  Commonly known as:  LIPITOR  Take 1 tablet (80 mg total) by mouth daily at 6 PM.     dorzolamide-timolol 22.3-6.8 MG/ML ophthalmic solution  Commonly known as:  COSOPT  Place 1 drop into both eyes 2 (two) times daily.     latanoprost 0.005 % ophthalmic solution  Commonly known as:  XALATAN  Place 1 drop into both eyes at bedtime.     metoprolol succinate 25 MG 24 hr tablet  Commonly known as:  TOPROL-XL  Take 25 mg by mouth 2 (two) times daily.     nitroGLYCERIN 0.4 MG SL tablet  Commonly known as:  NITROSTAT  Place 1 tablet (0.4 mg total) under the tongue every 5 (five) minutes x 3  doses as needed for chest pain.     ramipril 5 MG capsule  Commonly known as:  ALTACE  Take 5 mg by mouth daily.         Signed: Deitra Mayo 05/23/2015, 8:07 PM

## 2015-08-03 ENCOUNTER — Emergency Department (HOSPITAL_COMMUNITY): Payer: 59

## 2015-08-03 ENCOUNTER — Observation Stay (HOSPITAL_COMMUNITY)
Admission: EM | Admit: 2015-08-03 | Discharge: 2015-08-04 | DRG: 287 | Disposition: A | Discharge status: Home / Self Care | Payer: 59 | Attending: Cardiology | Admitting: Cardiology

## 2015-08-03 ENCOUNTER — Other Ambulatory Visit: Payer: Self-pay

## 2015-08-03 ENCOUNTER — Encounter (HOSPITAL_COMMUNITY): Payer: Self-pay | Admitting: Emergency Medicine

## 2015-08-03 DIAGNOSIS — Z87891 Personal history of nicotine dependence: Secondary | ICD-10-CM

## 2015-08-03 DIAGNOSIS — Z7952 Long term (current) use of systemic steroids: Secondary | ICD-10-CM | POA: Diagnosis not present

## 2015-08-03 DIAGNOSIS — Z8049 Family history of malignant neoplasm of other genital organs: Secondary | ICD-10-CM

## 2015-08-03 DIAGNOSIS — E871 Hypo-osmolality and hyponatremia: Secondary | ICD-10-CM | POA: Diagnosis present

## 2015-08-03 DIAGNOSIS — Z91048 Other nonmedicinal substance allergy status: Secondary | ICD-10-CM | POA: Diagnosis not present

## 2015-08-03 DIAGNOSIS — R7303 Prediabetes: Secondary | ICD-10-CM | POA: Diagnosis present

## 2015-08-03 DIAGNOSIS — L989 Disorder of the skin and subcutaneous tissue, unspecified: Secondary | ICD-10-CM | POA: Diagnosis present

## 2015-08-03 DIAGNOSIS — I739 Peripheral vascular disease, unspecified: Secondary | ICD-10-CM | POA: Diagnosis present

## 2015-08-03 DIAGNOSIS — Z8546 Personal history of malignant neoplasm of prostate: Secondary | ICD-10-CM

## 2015-08-03 DIAGNOSIS — E785 Hyperlipidemia, unspecified: Secondary | ICD-10-CM | POA: Diagnosis present

## 2015-08-03 DIAGNOSIS — Z955 Presence of coronary angioplasty implant and graft: Secondary | ICD-10-CM | POA: Diagnosis not present

## 2015-08-03 DIAGNOSIS — Z923 Personal history of irradiation: Secondary | ICD-10-CM | POA: Diagnosis not present

## 2015-08-03 DIAGNOSIS — Z7982 Long term (current) use of aspirin: Secondary | ICD-10-CM | POA: Diagnosis not present

## 2015-08-03 DIAGNOSIS — I10 Essential (primary) hypertension: Secondary | ICD-10-CM | POA: Diagnosis present

## 2015-08-03 DIAGNOSIS — Z803 Family history of malignant neoplasm of breast: Secondary | ICD-10-CM | POA: Diagnosis not present

## 2015-08-03 DIAGNOSIS — I252 Old myocardial infarction: Secondary | ICD-10-CM

## 2015-08-03 DIAGNOSIS — R079 Chest pain, unspecified: Secondary | ICD-10-CM | POA: Diagnosis present

## 2015-08-03 DIAGNOSIS — I25118 Atherosclerotic heart disease of native coronary artery with other forms of angina pectoris: Principal | ICD-10-CM | POA: Diagnosis present

## 2015-08-03 DIAGNOSIS — I249 Acute ischemic heart disease, unspecified: Secondary | ICD-10-CM | POA: Diagnosis present

## 2015-08-03 LAB — BASIC METABOLIC PANEL
Anion gap: 9 (ref 5–15)
BUN: 9 mg/dL (ref 6–20)
CHLORIDE: 92 mmol/L — AB (ref 101–111)
CO2: 27 mmol/L (ref 22–32)
CREATININE: 1.21 mg/dL (ref 0.61–1.24)
Calcium: 9 mg/dL (ref 8.9–10.3)
GFR calc non Af Amer: >60 mL/min (ref >60)
Glucose, Bld: 110 mg/dL — ABNORMAL HIGH (ref 65–99)
POTASSIUM: 4.2 mmol/L (ref 3.5–5.1)
SODIUM: 128 mmol/L — AB (ref 135–145)

## 2015-08-03 LAB — I-STAT TROPONIN, ED: TROPONIN I, POC: 0.02 ng/mL (ref 0.00–0.08)

## 2015-08-03 LAB — CBC
HCT: 38.8 % — ABNORMAL LOW (ref 39.0–52.0)
HEMOGLOBIN: 13.2 g/dL (ref 13.0–17.0)
MCH: 32.5 pg (ref 26.0–34.0)
MCHC: 34 g/dL (ref 30.0–36.0)
MCV: 95.6 fL (ref 78.0–100.0)
Platelets: 164 10*3/uL (ref 150–400)
RBC: 4.06 MIL/uL — AB (ref 4.22–5.81)
RDW: 13.7 % (ref 11.5–15.5)
WBC: 12.9 10*3/uL — ABNORMAL HIGH (ref 4.0–10.5)

## 2015-08-03 MED ORDER — NITROGLYCERIN IN D5W 200-5 MCG/ML-% IV SOLN
0.0000 ug/min | Freq: Once | INTRAVENOUS | Status: AC
  Administered 2015-08-03: 5 ug/min via INTRAVENOUS
  Filled 2015-08-03: qty 250

## 2015-08-03 MED ORDER — HEPARIN (PORCINE) IN NACL 100-0.45 UNIT/ML-% IJ SOLN
650.0000 [IU]/h | INTRAMUSCULAR | Status: DC
  Administered 2015-08-03: 700 [IU]/h via INTRAVENOUS
  Filled 2015-08-03: qty 250

## 2015-08-03 MED ORDER — SODIUM CHLORIDE 0.9 % IV SOLN
INTRAVENOUS | Status: DC
  Administered 2015-08-03: 21:00:00 via INTRAVENOUS

## 2015-08-03 MED ORDER — HEPARIN BOLUS VIA INFUSION
3500.0000 [IU] | Freq: Once | INTRAVENOUS | Status: AC
  Administered 2015-08-03: 3500 [IU] via INTRAVENOUS
  Filled 2015-08-03: qty 3500

## 2015-08-03 NOTE — ED Notes (Signed)
Per [facility] patient from home with chest pain that started at 1400 today.  Center of chest 5/10.  Patient received 324mg  aspirin, and 1x SL nitro.  Denies dizziness,  No nausea or vomiting.  NKDA.  Patient alert and oriented and in no apparent distress at this time.

## 2015-08-03 NOTE — H&P (Signed)
Rodney [name] is an 65 y.o. male.   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 Patient denies any nausea vomiting diaphoresis. Denies palpitation lightheadedness or syncope. Denies history of exertional chest pain but complains of claudication pain. Activity is limited. States chest pressure felt similar in nature when he had MI. Presently denies any chest pain. EKG done in the ED showed minor ST depression in inferior leads first set of troponin I is negative.  Past Medical History  Diagnosis Date  . Prostate cancer ([place]) 05/03/2009    Prostatectomy/Adenocrcinoma  . Heart murmur     history  . Cataract     left eye surgery/   . Radiation 07/02/11-08/22/11    Prostate fossa 68.4 gray 38 fractions    Past Surgical History  Procedure Laterality Date  . Left eye surgery    . Hemorrhoid surgery    . Robot assisted laparoscopic radical prostatectomy  05/03/2009  . Wrist surgery      left  . Left heart catheterization with coronary angiogram N/A 02/25/2012    Procedure: LEFT HEART CATHETERIZATION WITH CORONARY ANGIOGRAM;  Surgeon: Clent Demark, MD;  Location[facility] C[facility] LAB;  Service: Cardiovascular;  Laterality: N/A;  . Peripheral vascular catheterization N/A 02/10/2015    Procedure: Abdominal Aortogram;  Surgeon: Elam Dutch, MD;  Location: [facility] CV LAB;  Service: Cardiovascular;  Laterality: N/A;  . Peripheral vascular catheterization Bilateral 02/10/2015    Procedure: Lower Extremity Angiography;  Surgeon: Elam Dutch, MD;  Location: [place] CV LAB;  Service: Cardiovascular;  Laterality: Bilateral;  . Endarterectomy femoral Left 02/17/2015    Procedure: LEFT FEMORAl ENDARTERECTOMY ;  Surgeon: Angelia Mould, MD;  Location: [facility];  Service: Vascular;  Laterality: Left;  . Patch angioplasty Left 02/17/2015    Procedure: VEIN PATCH ANGIOPLASTY;  Surgeon: Angelia Mould, MD;  Location: [facility] OR;  Service: Vascular;  Laterality: Left;    Family History  Problem Relation Age of Onset  . Uterine cancer Mother 88    going to [employer]  . Breast cancer Sister    Social History:  reports that he quit smoking about 7 months ago. His smoking use included Cigarettes. He has a 72 pack-year smoking history. He has never used smokeless tobacco. He reports that he drinks about 3.6 oz of alcohol per week. He reports that he does not use illicit drugs.  Allergies:  Allergies  Allergen Reactions  . Other Rash    polyestor and metals except titanium     (Not in a hospital admission)  Results for orders placed or performed during the hospital encounter of 08/03/15 (from the past 48 hour(s))  CBC     Status: Abnormal   Collection Time: 08/03/15  5:20 PM  Result Value Ref Range   WBC 12.9 (H) 4.0 - 10.5 K/uL   RBC 4.06 (L) 4.22 - 5.81 MIL/uL   Hemoglobin 13.2 13.0 - 17.0 g/dL   HCT  38.8 (L) 39.0 - 52.0 %   MCV 95.6 78.0 - 100.0 fL   MCH 32.5 26.0 - 34.0 pg   MCHC 34.0 30.0 - 36.0 g/dL   RDW 13.7 11.5 - 15.5 %   Platelets 164 150 - 400 K/uL  Basic metabolic panel     Status: Abnormal   Collection Time: 08/03/15  5:20 PM  Result Value Ref Range   Sodium 128 (L) 135 - 145 mmol/L   Potassium 4.2 3.5 - 5.1 mmol/L   Chloride 92 (L) 101 - 111 mmol/L   CO2 27 22 - 32 mmol/L   Glucose, Bld 110 (H) 65 - 99 mg/dL   BUN 9  6 - 20 mg/dL   Creatinine, Ser 1.21 0.61 - 1.24 mg/dL   Calcium 9.0 8.9 - 10.3 mg/dL   GFR calc non Af Amer >60 >60 mL/min   GFR calc Af Amer >60 >60 mL/min    Comment: (NOTE) The eGFR has been calculated using the CKD EPI equation. This calculation has not been validated in all clinical situations. eGFR's persistently <60 mL/min signify possible Chronic Kidney Disease.    Anion gap 9 5 - 15  I-Stat Troponin, ED (not at [facility])     Status: None   Collection Time: 08/03/15  5:21 PM  Result Value Ref Range   Troponin i, poc 0.02 0.00 - 0.08 ng/mL   Comment 3            Comment: Due to the release kinetics of cTnI, a negative result within the first hours of the onset of symptoms does not rule out myocardial infarction with certainty. If myocardial infarction is still suspected, repeat the test at appropriate intervals.    Dg Chest 2 View  08/03/2015  CLINICAL DATA:  Chest pain starting today. Myocardial infarction in 2009. EXAM: CHEST  2 VIEW COMPARISON:  02/16/2015 FINDINGS: Emphysema. Mild atherosclerotic calcification of the aortic arch. Heart size within normal limits. Left lateral ninth and tenth rib irregularities, probably old fractures but not readily visible on 02/09/2015 CT, correlate with any point tenderness along the left lower chest. Biapical pleural parenchymal scarring.  No pleural effusion. IMPRESSION: 1. Left lateral ninth and tenth rib irregularities probably from old fractures, correlate with any point tenderness in this vicinity in assessing for more recent fracture. 2. Emphysema. 3. Atherosclerotic aortic arch. Electronically Signed   By: Van Clines M.D.   On: 08/03/2015 16:55    Review of Systems  Eyes: Negative for double vision.  Respiratory: Negative for cough, hemoptysis, sputum production and shortness of breath.   Cardiovascular: Positive for chest pain. Negative for palpitations, orthopnea and claudication.  Gastrointestinal: Positive for abdominal  pain. Negative for nausea and vomiting.  Genitourinary: Negative for dysuria.  Neurological: Negative for dizziness and headaches.    Blood pressure 126/77, pulse 68, resp. rate 10, SpO2 98 %. Physical Exam  Constitutional: He is oriented to person, place, and time.  HENT:  Head: Normocephalic and atraumatic.  Eyes: Conjunctivae are normal. Pupils are equal, round, and reactive to light. Left eye exhibits no discharge. No scleral icterus.  Neck: Normal range of motion. Neck supple. No JVD present. No tracheal deviation present. No thyromegaly present.  Cardiovascular: Normal rate and regular rhythm.   Murmur (Soft systolic murmur and S4 gallop noted) heard. Respiratory: Effort normal and breath sounds normal. No respiratory distress. He has no wheezes. He has no rales.  GI: Soft. Bowel sounds are normal. He exhibits no distension. There is no  tenderness. There is no rebound.  Musculoskeletal:  No clubbing cyanosis 2+ edema noted  Neurological: He is alert and oriented to person, place, and time.     Assessment/Plan Acute coronary syndrome Coronary artery disease history of non-Q-wave MI in the past status post PCI to left circumflex Hypertension Hyperlipidemia Prediabetic Peripheral vascular disease History of tobacco abuse Hyponatremia Plan Discussed with patient regarding various options of treatment i.e. noninvasive stress testing versus left cardiac cath possible PTCA stenting its risk and benefits i.e. death MI stroke need for emergency CABG local vascular complications etc. and consents for PCI  Charolette Forward, MD 08/03/2015, 7:36 PM

## 2015-08-03 NOTE — ED Provider Notes (Signed)
CSN: [id number]     Arrival date & time 08/03/15  1555 History   First MD Initiated Contact with Patient 08/03/15 1610     Chief Complaint  Patient presents with  . Chest Pain   HPI   Rodney [name] is a 65 y.o. male PMH significant for MI (status post stent placement, 2013) presenting with chest pain that started at 1400 today. He describes his chest pain as midsternal, nonradiating, 5 out of 10 pain scale, dull, similar to previous MI in 2013 but less severe, nonexertional, episode lasted approximately 1.5 hours. He received 324 mg of aspirin and 1 sublingual nitroglycerin prior to arrival. He denies current chest pain, fevers, chills, abdominal pain, nausea, vomiting, change in bowel or bladder habits, cough, shortness of breath.  Past Medical History  Diagnosis Date  . Prostate cancer ([place]) 05/03/2009    Prostatectomy/Adenocrcinoma  . Heart murmur     history  . Cataract     left eye surgery/   . Radiation 07/02/11-08/22/11    Prostate fossa 68.4 gray 38 fractions   Past Surgical History  Procedure Laterality Date  . Left eye surgery    . Hemorrhoid surgery    . Robot assisted laparoscopic radical prostatectomy  05/03/2009  . Wrist surgery      left  . Left heart catheterization with coronary angiogram N/A 02/25/2012    Procedure: LEFT HEART CATHETERIZATION WITH CORONARY ANGIOGRAM;  Surgeon: Clent Demark, MD;  Location: [facility] CATH LAB;  Service: Cardiovascular;  Laterality: N/A;  . Peripheral vascular catheterization N/A 02/10/2015    Procedure: Abdominal Aortogram;  Surgeon: Elam Dutch, MD;  Location: [place] CV LAB;  Service: Cardiovascular;  Laterality: N/A;  . Peripheral vascular catheterization Bilateral 02/10/2015    Procedure: Lower Extremity Angiography;  Surgeon: Elam Dutch, MD;  Location: [place] CV LAB;  Service: Cardiovascular;  Laterality: Bilateral;  . Endarterectomy femoral Left 02/17/2015    Procedure: LEFT FEMORAl ENDARTERECTOMY ;  Surgeon: Angelia Mould, MD;  Location: [place];  Service: Vascular;  Laterality: Left;  . Patch angioplasty Left 02/17/2015    Procedure: VEIN PATCH ANGIOPLASTY;  Surgeon: Angelia Mould, MD;  Location: [facility] OR;  Service: Vascular;  Laterality: Left;   Family History  Problem Relation Age of Onset  . Uterine cancer Mother 47    going to [employer]  . Breast cancer Sister    Social History  Substance Use Topics  . Smoking status: Former Smoker -- 2.00 packs/day for 36 years    Types: Cigarettes    Quit date: 12/08/2014  . Smokeless tobacco: Never Used  . Alcohol Use: 3.6 oz/week    6 Cans of beer per week     Comment: 6-7  cans drinks daily 12 oz    Review of Systems  Ten systems are reviewed and are negative for acute change except as noted in the HPI   Allergies  Other  Home Medications   Prior to Admission medications   Medication Sig Start Date End Date Taking? Authorizing Provider  aspirin EC 81 MG EC tablet Take 1 tablet (81 mg total) by mouth daily. 02/27/12  Yes Charolette Forward, MD  atorvastatin (LIPITOR) 80 MG tablet Take 1 tablet (80 mg total) by mouth daily at 6 PM. 02/27/12  Yes Charolette Forward, MD  DUREZOL 0.05 % EMUL Place 1 drop into the right eye 2 (two) times daily. 07/10/15  Yes Historical Provider, MD  latanoprost (XALATAN) 0.005 % ophthalmic solution Place  1 drop into both eyes at bedtime.   Yes Historical Provider, MD  metoprolol succinate (TOPROL-XL) 25 MG 24 hr tablet Take 25 mg by mouth 2 (two) times daily.   Yes Historical Provider, MD  nitroGLYCERIN (NITROSTAT) 0.4 MG SL tablet Place 1 tablet (0.4 mg total) under the tongue every 5 (five) minutes x 3 doses as needed for chest pain. 02/27/12  Yes Charolette Forward, MD  predniSONE (DELTASONE) 10 MG tablet Take 10 mg by mouth 3 (three) times daily. 07/20/15  Yes Historical Provider, MD  ramipril (ALTACE) 5 MG capsule Take 5 mg by mouth daily.   Yes Historical Provider, MD  BESIVANCE 0.6 % SUSP 1 DROP IN RIGHT EYE 4 TIMES A DAY.  START THIS DROP PRIOR TO SURGERY AND CONTINUE UNTIL GONE 07/10/15   Historical Provider, MD  ciprofloxacin (CIPRO) 500 MG tablet Take 1 tablet (500 mg total) by mouth 2 (two) times daily. Patient not taking: Reported on 03/08/2015 02/18/15   Serafina Mitchell, MD  ciprofloxacin (CIPRO) 500 MG tablet Take 1 tablet (500 mg total) by mouth 2 (two) times daily. Patient not taking: Reported on 03/08/2015 02/18/15   Serafina Mitchell, MD  oxyCODONE (ROXICODONE) 5 MG immediate release tablet Take 1 tablet (5 mg total) by mouth every 4 (four) hours as needed for severe pain. Patient not taking: Reported on 08/03/2015 02/18/15   Serafina Mitchell, MD  PROLENSA 0.07 % SOLN 1 DROP IN RIGHT EYE DAILY START THIS DROP 07/30/15 AND CONTINUE UNTIL GONE 07/10/15   Historical Provider, MD   There were no vitals taken for this visit. Physical Exam  Constitutional: He appears well-developed and well-nourished. No distress.  HENT:  Head: Normocephalic and atraumatic.  Mouth/Throat: Oropharynx is clear and moist. No oropharyngeal exudate.  Eyes: Conjunctivae are normal. Pupils are equal, round, and reactive to light. Right eye exhibits no discharge. Left eye exhibits no discharge. No scleral icterus.  Neck: No tracheal deviation present.  Cardiovascular: Normal rate, regular rhythm, normal heart sounds and intact distal pulses.  Exam reveals no gallop and no friction rub.   No murmur heard. Pulmonary/Chest: Effort normal and breath sounds normal. No respiratory distress. He has no wheezes. He has no rales. He exhibits no tenderness.  Abdominal: Soft. Bowel sounds are normal. He exhibits no distension and no mass. There is no tenderness. There is no rebound and no guarding.  Musculoskeletal: He exhibits no edema.  Lymphadenopathy:    He has no cervical adenopathy.  Neurological: He is alert. Coordination normal.  Skin: Skin is warm and dry. No rash noted. He is not diaphoretic. No erythema.  Psychiatric: He has a normal mood and  affect. His behavior is normal.  Nursing note and vitals reviewed.   ED Course  Procedures  Labs Review Labs Reviewed  CBC - Abnormal; Notable for the following:    WBC 12.9 (*)    RBC 4.06 (*)    HCT 38.8 (*)    All other components within normal limits  BASIC METABOLIC PANEL - Abnormal; Notable for the following:    Sodium 128 (*)    Chloride 92 (*)    Glucose, Bld 110 (*)    All other components within normal limits  Randolm Idol, ED   Imaging Review Dg Chest 2 View  08/03/2015  CLINICAL DATA:  Chest pain starting today. Myocardial infarction in 2009. EXAM: CHEST  2 VIEW COMPARISON:  02/16/2015 FINDINGS: Emphysema. Mild atherosclerotic calcification of the aortic arch. Heart size within normal  limits. Left lateral ninth and tenth rib irregularities, probably old fractures but not readily visible on 02/09/2015 CT, correlate with any point tenderness along the left lower chest. Biapical pleural parenchymal scarring.  No pleural effusion. IMPRESSION: 1. Left lateral ninth and tenth rib irregularities probably from old fractures, correlate with any point tenderness in this vicinity in assessing for more recent fracture. 2. Emphysema. 3. Atherosclerotic aortic arch. Electronically Signed   By: Van Clines M.D.   On: 08/03/2015 16:55   I have personally reviewed and evaluated these images and lab results as part of my medical decision-making.   EKG Interpretation   Date/Time:  Thursday August 03 2015 16:30:35 EST Ventricular Rate:  73 PR Interval:  142 QRS Duration: 76 QT Interval:  388 QTC Calculation: 427 R Axis:   81 Text Interpretation:  Normal sinus rhythm Nonspecific ST abnormality  Abnormal ECG ST segments scooping in inferior leads Confirmed by GOLDSTON   MD, SCOTT ([id number]) on 08/03/2015 4:57:11 PM      MDM   Final diagnoses:  Chest pain, unspecified chest pain type   Patient nontoxic appearing, VSS.  Troponin, chest x-ray, BMP unremarkable for acute change.  CBC with nonspecific leukocytosis of 12.9. EKG with nonspecific ST abnormalities. Dr. Terrence Dupont evaluated patient for admission. Patient in understanding and agreement with the plan.  Concern for cardiac etiology of Chest Pain. Cardiology has been consulted and will see patient in the ED for likely admit. Pt does not meet criteria for CP protocol and a further evaluation is recommended. Pt has been re-evaluated prior to consult and VSS, NAD, heart RRR, pain 0/10, lungs CTAB. First round of cardiac enzymes negative. This case was discussed with Dr. Regenia Skeeter who has seen the patient and agrees with plan to admit.   Acme Lions, PA-C 08/03/15 2015  Sherwood Gambler, MD 08/04/15 [phone]

## 2015-08-03 NOTE — ED Notes (Signed)
Heart healthy meal tray at bedside

## 2015-08-03 NOTE — ED Notes (Signed)
MD in room with pt

## 2015-08-03 NOTE — Progress Notes (Signed)
ANTICOAGULATION CONSULT NOTE - Initial Consult  Pharmacy Consult for Heparin Indication: chest pain/ACS and DVT  Allergies  Allergen Reactions  . Other Rash    polyestor and metals except titanium    Patient Measurements:   Vital Signs: BP: 111/81 mmHg (03/02 1830) Pulse Rate: 73 (03/02 1830)  Labs:  Recent Labs  08/03/15 1720  HGB 13.2  HCT 38.8*  PLT 164  CREATININE 1.21    CrCl cannot be calculated (Unknown ideal weight.).   Medical History: Past Medical History  Diagnosis Date  . Prostate cancer ([place]) 05/03/2009    Prostatectomy/Adenocrcinoma  . Heart murmur     history  . Cataract     left eye surgery/   . Radiation 07/02/11-08/22/11    Prostate fossa 68.4 gray 38 fractions     Assessment: 85 yom admitted 08/03/2015 with ACS/STEMI. Pharmacy consulted to dose heparin IV. Wt 60kg in 10/16.  No AC PTA. CBC stable  Goal of Therapy:  INR 2-3 Heparin level 0.3-0.7 units/ml Monitor platelets by anticoagulation protocol: Yes   Plan:  Heparin 3500 units x1  Heparin 700 units/hr  6hr HL  Monitor for s/s of bleeding   Yuriel Lopezmartinez C. Lennox Grumbles, PharmD Pharmacy Resident  Pager: [phone] 08/03/2015 8:53 PM

## 2015-08-04 ENCOUNTER — Encounter (HOSPITAL_COMMUNITY): Payer: Self-pay | Admitting: *Deleted

## 2015-08-04 ENCOUNTER — Encounter (HOSPITAL_COMMUNITY)
Admission: EM | Disposition: A | Discharge status: Home / Self Care | Payer: Self-pay | Source: Home / Self Care | Attending: Emergency Medicine

## 2015-08-04 HISTORY — PX: CARDIAC CATHETERIZATION: SHX172

## 2015-08-04 LAB — CBC WITH DIFFERENTIAL/PLATELET
Basophils Absolute: 0 K/uL (ref 0.0–0.1)
Basophils Relative: 0 %
Eosinophils Absolute: 0 K/uL (ref 0.0–0.7)
Eosinophils Relative: 0 %
HCT: 33.1 % — ABNORMAL LOW (ref 39.0–52.0)
Hemoglobin: 11.3 g/dL — ABNORMAL LOW (ref 13.0–17.0)
Lymphocytes Relative: 8 %
Lymphs Abs: 0.8 K/uL (ref 0.7–4.0)
MCH: 32.4 pg (ref 26.0–34.0)
MCHC: 34.1 g/dL (ref 30.0–36.0)
MCV: 94.8 fL (ref 78.0–100.0)
Monocytes Absolute: 0.4 K/uL (ref 0.1–1.0)
Monocytes Relative: 4 %
Neutro Abs: 9 K/uL — ABNORMAL HIGH (ref 1.7–7.7)
Neutrophils Relative %: 88 %
Platelets: 152 K/uL (ref 150–400)
RBC: 3.49 MIL/uL — ABNORMAL LOW (ref 4.22–5.81)
RDW: 13.8 % (ref 11.5–15.5)
WBC: 10.1 K/uL (ref 4.0–10.5)

## 2015-08-04 LAB — MRSA PCR SCREENING: MRSA by PCR: NEGATIVE

## 2015-08-04 LAB — PROTIME-INR
INR: 0.97 (ref 0.00–1.49)
INR: 1 (ref 0.00–1.49)
PROTHROMBIN TIME: 13.1 s (ref 11.6–15.2)
Prothrombin Time: 13.4 s (ref 11.6–15.2)

## 2015-08-04 LAB — LIPID PANEL
CHOLESTEROL: 143 mg/dL (ref 0–200)
HDL: 95 mg/dL (ref >40)
LDL Cholesterol: 38 mg/dL (ref 0–99)
TRIGLYCERIDES: 51 mg/dL (ref <150)
Total CHOL/HDL Ratio: 1.5 RATIO
VLDL: 10 mg/dL (ref 0–40)

## 2015-08-04 LAB — COMPREHENSIVE METABOLIC PANEL WITH GFR
ALT: 31 U/L (ref 17–63)
AST: 27 U/L (ref 15–41)
Albumin: 2.1 g/dL — ABNORMAL LOW (ref 3.5–5.0)
Alkaline Phosphatase: 92 U/L (ref 38–126)
Anion gap: 9 (ref 5–15)
BUN: 10 mg/dL (ref 6–20)
CO2: 27 mmol/L (ref 22–32)
Calcium: 8.3 mg/dL — ABNORMAL LOW (ref 8.9–10.3)
Chloride: 94 mmol/L — ABNORMAL LOW (ref 101–111)
Creatinine, Ser: 1.26 mg/dL — ABNORMAL HIGH (ref 0.61–1.24)
GFR calc Af Amer: >60 mL/min
GFR calc non Af Amer: 59 mL/min — ABNORMAL LOW
Glucose, Bld: 109 mg/dL — ABNORMAL HIGH (ref 65–99)
Potassium: 4.2 mmol/L (ref 3.5–5.1)
Sodium: 130 mmol/L — ABNORMAL LOW (ref 135–145)
Total Bilirubin: 0.7 mg/dL (ref 0.3–1.2)
Total Protein: 4.5 g/dL — ABNORMAL LOW (ref 6.5–8.1)

## 2015-08-04 LAB — CBC
HCT: 33.1 % — ABNORMAL LOW (ref 39.0–52.0)
Hemoglobin: 11 g/dL — ABNORMAL LOW (ref 13.0–17.0)
MCH: 32.1 pg (ref 26.0–34.0)
MCHC: 33.2 g/dL (ref 30.0–36.0)
MCV: 96.5 fL (ref 78.0–100.0)
PLATELETS: 149 10*3/uL — AB (ref 150–400)
RBC: 3.43 MIL/uL — ABNORMAL LOW (ref 4.22–5.81)
RDW: 14 % (ref 11.5–15.5)
WBC: 9.1 10*3/uL (ref 4.0–10.5)

## 2015-08-04 LAB — BASIC METABOLIC PANEL
ANION GAP: 10 (ref 5–15)
BUN: 11 mg/dL (ref 6–20)
CO2: 25 mmol/L (ref 22–32)
Calcium: 8.2 mg/dL — ABNORMAL LOW (ref 8.9–10.3)
Chloride: 94 mmol/L — ABNORMAL LOW (ref 101–111)
Creatinine, Ser: 1.25 mg/dL — ABNORMAL HIGH (ref 0.61–1.24)
GFR calc Af Amer: >60 mL/min (ref >60)
GFR, EST NON AFRICAN AMERICAN: 60 mL/min — AB (ref >60)
GLUCOSE: 106 mg/dL — AB (ref 65–99)
POTASSIUM: 4.4 mmol/L (ref 3.5–5.1)
Sodium: 129 mmol/L — ABNORMAL LOW (ref 135–145)

## 2015-08-04 LAB — TSH: TSH: 1.827 u[IU]/mL (ref 0.350–4.500)

## 2015-08-04 LAB — HEPARIN LEVEL (UNFRACTIONATED): Heparin Unfractionated: 0.77 [IU]/mL — ABNORMAL HIGH (ref 0.30–0.70)

## 2015-08-04 LAB — TROPONIN I
Troponin I: <0.03 ng/mL
Troponin I: <0.03 ng/mL (ref <0.031)

## 2015-08-04 LAB — POCT ACTIVATED CLOTTING TIME: Activated Clotting Time: 126 seconds

## 2015-08-04 SURGERY — LEFT HEART CATH AND CORONARY ANGIOGRAPHY
Anesthesia: LOCAL

## 2015-08-04 MED ORDER — ACETAMINOPHEN 325 MG PO TABS: 650.0000 mg | ORAL_TABLET | ORAL | Status: DC | PRN

## 2015-08-04 MED ORDER — SODIUM CHLORIDE 0.9 % IV SOLN
INTRAVENOUS | Status: DC | PRN
  Administered 2015-08-04: 250 mL/h via INTRAVENOUS

## 2015-08-04 MED ORDER — SODIUM CHLORIDE 0.9% FLUSH: 3.0000 mL | INTRAVENOUS | Status: DC | PRN

## 2015-08-04 MED ORDER — HEPARIN (PORCINE) IN NACL 2-0.9 UNIT/ML-% IJ SOLN
INTRAMUSCULAR | Status: DC | PRN
Start: 2015-08-04 — End: 2015-08-04
  Administered 2015-08-04: 1000 mL

## 2015-08-04 MED ORDER — ASPIRIN EC 81 MG PO TBEC: 81.0000 mg | DELAYED_RELEASE_TABLET | Freq: Every day | ORAL | Status: DC

## 2015-08-04 MED ORDER — SODIUM CHLORIDE 0.9 % IV SOLN: INTRAVENOUS | Status: AC

## 2015-08-04 MED ORDER — PREDNISONE 5 MG PO TABS
10.0000 mg | ORAL_TABLET | Freq: Three times a day (TID) | ORAL | Status: DC
  Administered 2015-08-04: 10 mg via ORAL
  Filled 2015-08-04: qty 2

## 2015-08-04 MED ORDER — SODIUM CHLORIDE 0.9 % IV SOLN: 250.0000 mL | INTRAVENOUS | Status: DC | PRN

## 2015-08-04 MED ORDER — ONDANSETRON HCL 4 MG/2ML IJ SOLN: 4.0000 mg | Freq: Four times a day (QID) | INTRAMUSCULAR | Status: DC | PRN

## 2015-08-04 MED ORDER — ATORVASTATIN CALCIUM 80 MG PO TABS: 80.0000 mg | ORAL_TABLET | Freq: Every day | ORAL | Status: DC

## 2015-08-04 MED ORDER — MIDAZOLAM HCL 2 MG/2ML IJ SOLN
INTRAMUSCULAR | Status: AC
  Filled 2015-08-04: qty 2

## 2015-08-04 MED ORDER — SODIUM CHLORIDE 0.9 % WEIGHT BASED INFUSION
1.0000 mL/kg/h | INTRAVENOUS | Status: DC
  Administered 2015-08-04: 1 mL/kg/h via INTRAVENOUS

## 2015-08-04 MED ORDER — SODIUM CHLORIDE 0.9% FLUSH: 3.0000 mL | Freq: Two times a day (BID) | INTRAVENOUS | Status: DC

## 2015-08-04 MED ORDER — ASPIRIN 81 MG PO CHEW
324.0000 mg | CHEWABLE_TABLET | ORAL | Status: DC
  Filled 2015-08-04: qty 4

## 2015-08-04 MED ORDER — LIDOCAINE HCL (PF) 1 % IJ SOLN
INTRAMUSCULAR | Status: DC | PRN
  Administered 2015-08-04: 11 mL

## 2015-08-04 MED ORDER — MIDAZOLAM HCL 2 MG/2ML IJ SOLN
INTRAMUSCULAR | Status: DC | PRN
  Administered 2015-08-04: 1 mg via INTRAVENOUS

## 2015-08-04 MED ORDER — NITROGLYCERIN IN D5W 200-5 MCG/ML-% IV SOLN: 5.0000 ug/min | INTRAVENOUS | Status: DC

## 2015-08-04 MED ORDER — PANTOPRAZOLE SODIUM 40 MG PO TBEC: 40.0000 mg | DELAYED_RELEASE_TABLET | Freq: Every day | ORAL | Status: DC

## 2015-08-04 MED ORDER — NITROGLYCERIN 0.4 MG SL SUBL: 0.4000 mg | SUBLINGUAL_TABLET | SUBLINGUAL | Status: DC | PRN

## 2015-08-04 MED ORDER — LIDOCAINE HCL (PF) 1 % IJ SOLN
INTRAMUSCULAR | Status: AC
  Filled 2015-08-04: qty 30

## 2015-08-04 MED ORDER — HEPARIN (PORCINE) IN NACL 2-0.9 UNIT/ML-% IJ SOLN
INTRAMUSCULAR | Status: AC
  Filled 2015-08-04: qty 1000

## 2015-08-04 MED ORDER — FENTANYL CITRATE (PF) 100 MCG/2ML IJ SOLN
INTRAMUSCULAR | Status: DC | PRN
  Administered 2015-08-04: 25 ug via INTRAVENOUS

## 2015-08-04 MED ORDER — METOPROLOL TARTRATE 12.5 MG HALF TABLET
12.5000 mg | ORAL_TABLET | Freq: Two times a day (BID) | ORAL | Status: DC
  Administered 2015-08-04: 10:00:00 12.5 mg via ORAL
  Filled 2015-08-04: qty 1

## 2015-08-04 MED ORDER — IOHEXOL 350 MG/ML SOLN
INTRAVENOUS | Status: DC | PRN
  Administered 2015-08-04: 60 mL via INTRAVENOUS

## 2015-08-04 MED ORDER — ASPIRIN 81 MG PO CHEW
81.0000 mg | CHEWABLE_TABLET | ORAL | Status: AC
  Administered 2015-08-04: 81 mg via ORAL

## 2015-08-04 MED ORDER — FENTANYL CITRATE (PF) 100 MCG/2ML IJ SOLN
INTRAMUSCULAR | Status: AC
  Filled 2015-08-04: qty 2

## 2015-08-04 MED ORDER — ASPIRIN 300 MG RE SUPP: 300.0000 mg | RECTAL | Status: DC

## 2015-08-04 MED ORDER — CLOPIDOGREL BISULFATE 300 MG PO TABS
300.0000 mg | ORAL_TABLET | Freq: Once | ORAL | Status: AC
  Administered 2015-08-04: 300 mg via ORAL
  Filled 2015-08-04: qty 1

## 2015-08-04 SURGICAL SUPPLY — 10 items
CATH INFINITI 5FR JL5 (CATHETERS) ×2 IMPLANT
CATH INFINITI 5FR MULTPACK ANG (CATHETERS) ×2 IMPLANT
KIT HEART LEFT (KITS) ×2 IMPLANT
PACK CARDIAC CATHETERIZATION (CUSTOM PROCEDURE TRAY) ×2 IMPLANT
SHEATH PINNACLE 5F 10CM (SHEATH) ×2 IMPLANT
SYR MEDRAD MARK V 150ML (SYRINGE) ×2 IMPLANT
TRANSDUCER W/STOPCOCK (MISCELLANEOUS) ×2 IMPLANT
WIRE EMERALD 3MM-J .035X150CM (WIRE) ×2 IMPLANT
WIRE EMERALD 3MM-J .035X260CM (WIRE) ×2 IMPLANT
WIRE HI TORQ VERSACORE-J 145CM (WIRE) ×2 IMPLANT

## 2015-08-04 NOTE — Discharge Summary (Signed)
NAME:  Rodney [name]                ACCOUNT NO.:  [id number]  MEDICAL RECORD NO.:  [id number]  LOCATION:  6C08C                        FACILITY:  [facility]  PHYSICIAN:  Tali Cleaves N. Terrence Dupont, M.D. DATE OF BIRTH:  [birth year]  DATE OF ADMISSION:  08/03/2015 DATE OF DISCHARGE:  08/04/2015                              DISCHARGE SUMMARY   ADMITTING DIAGNOSES: 1. Acute coronary syndrome, rule out myocardial infarction. 2. Coronary artery disease. 3. History of non-Q-wave myocardial infarction in the past status post     percutaneous coronary intervention to left circumflex in the past. 4. Hypertension. 5. Hyperlipidemia. 6. Prediabetic. 7. Peripheral vascular disease. 8. History of tobacco abuse in the past. 9. Hyponatremia. 10.Chronic skin disease on steroids.  FINAL DIAGNOSES: 1. Stable angina, myocardial infarction ruled out status post cardiac     catheterization, noted to have patent stent. 2. Coronary artery disease. 3. History of non-Q-wave myocardial infarction in the past status post     percutaneous coronary intervention to left circumflex with patent     stent. 4. Hypertension. 5. Hyperlipidemia. 6. Prediabetic. 7. Peripheral vascular disease. 8. History of tobacco abuse in the past. 9. Resolving hyponatremia. 10.Chronic skin disorder on chronic steroids.  DISCHARGE MEDICATIONS ARE: 1. Protonix 40 mg 1 tablet daily. 2. Aspirin 81 mg 1 tablet daily. 3. Atorvastatin 80 mg 1 tablet daily. 4. Multiple eye drops as before. 5. Metoprolol succinate 25 mg twice daily as before. 6. Nitrostat sublingual p.r.n. 7. Prednisone 30 mg daily as before. 8. Ramipril 5 mg 1 capsule daily.  DIET:  Low-salt, low-cholesterol 1800 calories ADA diet.  FOLLOWUP:  With me in 1 week.  Follow up with Dermatology and Vascular Surgery as scheduled.  CONDITION AT DISCHARGE:  Stable.  Post cardiac cath instructions have been given.  BRIEF HISTORY AND HOSPITAL COURSE:  [name] is  65 year old male with past medical history significant for coronary artery disease, history of non-Q-wave myocardial infarction in September 2013. Subsequently, he had cardiac catheterization and PTCA stenting to left circumflex, hypertension, hyperlipidemia, peripheral vascular disease, history of tobacco abuse in the past, prediabetic, history of carcinoma of prostate, chronic skin condition on chronic steroids.  He came to the ER by EMS complaining of retrosternal chest pain described as pressure, grade 5/10 radiating to epigastric region after coming out of showers, says pressure lasted approximately hour to hour and a half.  Received aspirin and 1 sublingual nitro with relief of chest pain.  The patient denies any nausea, vomiting, diaphoresis.  Denies palpitation, lightheadedness, or syncope.  Denies any history of exertional chest pain, but complains of claudication, pain, although activity is limited due to claudication pain.  States chest pain felt similar in nature when he had MI in September 2013.  Presently, denies any chest pain.  EKG done in the ED showed minor ST depression in inferior leads.  First set of troponin-I was negative.  PHYSICAL EXAMINATION:  VITAL SIGNS:  He was alert, awake, oriented x3, in no acute distress.  Blood pressure was 126/77, pulse 68.  He was afebrile.  HEENT:  Conjunctivae were pink. NECK:  Supple.  No JVD.  No bruit. LUNGS:  Clear to auscultation without rhonchi  or rales. CARDIOVASCULAR:  S1, S2 was normal.  There was soft systolic murmur and S4 gallop. ABDOMEN:  Soft.  Bowel sounds are present.  Nontender. EXTREMITIES:  There is no clubbing, cyanosis.  There was 2+ edema on the ankles. NEUROLOGIC:  Grossly intact.  LABORATORY DATA:  His labs, sodium was 128, potassium 4.2, BUN 9, creatinine 1.21.  Three sets of troponin-I were negative.  His albumin was low 2.1.  Total protein was also low 4.5.  Cholesterol was 143, HDL 51, LDL 38.   Hemoglobin was 13.2, hematocrit 38.8, white count of 12.9. Repeat hemoglobin 11, hematocrit 33.1, white count of 9.1, which has been stable.  TSH was 1.82.  Blood sugar was 106.  BRIEF HOSPITAL COURSE:  The patient was admitted to step-down unit.  MI was ruled out by serial enzymes and EKG.  The patient subsequently underwent left cardiac cath with selective left and right coronary angiography.  LV graphy as per procedure report.  The patient tolerated the procedure well.  There were no complications.  Postprocedure, the patient did not have any anginal chest pain.  His groin is stable with no evidence of hematoma or bruit.  The patient is ambulating in room without any problems.  The patient will be discharged home on above medications and will be followed up in my office in 1 week and his dermatologist and vascular surgeon as outpatient as scheduled.     Allegra Lai. Terrence Dupont, M.D.     MNH/MEDQ  D:  08/04/2015  T:  08/04/2015  Job:  [id number]

## 2015-08-04 NOTE — Interval H&P Note (Signed)
Cath Lab Visit (complete for each Cath Lab visit)  Clinical Evaluation Leading to the Procedure:   ACS: Yes.    Non-ACS:    Anginal Classification: CCS III  Anti-ischemic medical therapy: Maximal Therapy (2 or more classes of medications)  Non-Invasive Test Results: No non-invasive testing performed  Prior CABG: No previous CABG      History and Physical Interval Note:  08/04/2015 11:25 AM  Rodney [name]  has presented today for surgery, with the diagnosis of unstable angina  The various methods of treatment have been discussed with the patient and family. After consideration of risks, benefits and other options for treatment, the patient has consented to  Procedure(s): Left Heart Cath and Coronary Angiography (N/A) as a surgical intervention .  The patient's history has been reviewed, patient examined, no change in status, stable for surgery.  I have reviewed the patient's chart and labs.  Questions were answered to the patient's satisfaction.     Charolette Forward

## 2015-08-04 NOTE — Discharge Instructions (Signed)
Coronary Angiogram °A coronary angiogram, also called coronary angiography, is an X-ray procedure used to look at the arteries in the heart. In this procedure, a dye (contrast dye) is injected through a long, hollow tube (catheter). The catheter is about the size of a piece of cooked spaghetti and is inserted through your groin, wrist, or arm. The dye is injected into each artery, and X-rays are then taken to show if there is a blockage in the arteries of your heart. °LET YOUR HEALTH CARE PROVIDER KNOW ABOUT: °· Any allergies you have, including allergies to shellfish or contrast dye.   °· All medicines you are taking, including vitamins, herbs, eye drops, creams, and over-the-counter medicines.   °· Previous problems you or members of your family have had with the use of anesthetics.   °· Any blood disorders you have.   °· Previous surgeries you have had. °· History of kidney problems or failure.   °· Other medical conditions you have. °RISKS AND COMPLICATIONS  °Generally, a coronary angiogram is a safe procedure. However, problems can occur and include: °· Allergic reaction to the dye. °· Bleeding from the access site or other locations. °· Kidney injury, especially in people with impaired kidney function.  °· Stroke (rare). °· Heart attack (rare). °BEFORE THE PROCEDURE  °· Do not eat or drink anything after midnight the night before the procedure or as directed by your health care provider.   °· Ask your health care provider about changing or stopping your regular medicines. This is especially important if you are taking diabetes medicines or blood thinners. °PROCEDURE °· You may be given a medicine to help you relax (sedative) before the procedure. This medicine is given through an intravenous (IV) access tube that is inserted into one of your veins.   °· The area where the catheter will be inserted will be washed and shaved. This is usually done in the groin but may be done in the fold of your arm (near your  elbow) or in the wrist.    °· A medicine will be given to numb the area where the catheter will be inserted (local anesthetic).   °· The health care provider will insert the catheter into an artery. The catheter will be guided by using a special type of X-ray (fluoroscopy) of the blood vessel being examined.   °· A special dye will then be injected into the catheter, and X-rays will be taken. The dye will help to show where any narrowing or blockages are located in the heart arteries.   °AFTER THE PROCEDURE  °· If the procedure is done through the leg, you will be kept in bed lying flat for several hours. You will be instructed to not bend or cross your legs. °· The insertion site will be checked frequently.   °· The pulse in your feet or wrist will be checked frequently.   °· Additional blood tests, X-rays, and an electrocardiogram may be done.   °  °This information is not intended to replace advice given to you by your health care provider. Make sure you discuss any questions you have with your health care provider. °  °Document Released: 11/24/2002 Document Revised: 06/10/2014 Document Reviewed: 10/12/2012 °Elsevier Interactive Patient Education ©2016 [employer] °Acute Coronary Syndrome °Acute coronary syndrome (ACS) is a serious problem in which there is suddenly not enough blood and oxygen supplied to the heart. ACS may mean that one or more of the blood vessels in your heart (coronary arteries) may be blocked. ACS can result in chest pain or a heart attack (myocardial infarction   or MI). °CAUSES °This condition is caused by atherosclerosis, which is the buildup of fat and cholesterol (plaque) on the inside of the arteries. Over time, the plaque may narrow or block the artery, and this will lessen blood flow to the heart. Plaque can also become weak and break off within a coronary artery to form a clot and cause a sudden blockage. °RISK FACTORS °The risks factors of this condition include: °· High  cholesterol levels. °· High blood pressure (hypertension). °· Smoking. °· Diabetes. °· Age. °· Family history of chest pain, heart disease, or stroke. °· Lack of exercise. °SYMPTOMS °The most common signs of this condition include: °· Chest pain, which can be: °· A crushing or squeezing in the chest. °· A tightness, pressure, fullness, or heaviness in the chest. °· Present for more than a few minutes, or it can stop and recur. °· Pain in the arms, neck, jaw, or back. °· Unexplained heartburn or indigestion. °· Shortness of breath. °· Nausea. °· Sudden cold sweats. °· Feeling light-headed or dizzy. °Sometimes, this condition has no symptoms. °DIAGNOSIS °ACS may be diagnosed through the following tests: °· Electrocardiogram (ECG). °· Blood tests. °· Coronary angiogram. This is a procedure to look at the coronary arteries to see if there is any blockage. °TREATMENT °Treatment for ACS may include: °· Healthy behavioral changes to reduce or control risk factors. °· Medicine. °· Coronary stenting. A stent helps to keep an artery open. °· Coronary angioplasty. This procedure widens a narrowed or blocked artery. °· Coronary artery bypass surgery. This will allow your blood to pass the blockage (bypass) to reach your heart. °HOME CARE INSTRUCTIONS °Eating and Drinking °· Follow a heart-healthy diet. A dietitian can you help to educate you about healthy food options and changes. °· Use healthy cooking methods such as roasting, grilling, broiling, baking, poaching, steaming, or stir-frying. Talk to a dietitian to learn more about healthy cooking methods. °Medicines °· Take medicines only as directed by your health care provider. °· Do not take the following medicines unless your health care provider approves: °¨ Nonsteroidal anti-inflammatory drugs (NSAIDs), such as ibuprofen, naproxen, or celecoxib. °¨ Vitamin supplements that contain vitamin A, vitamin E, or both. °¨ Hormone replacement therapy that contains estrogen with or  without progestin. °· Stop illegal drug use. °Activities °· Follow an exercise program that is approved by your health care provider. °· Plan rest periods when you are fatigued. °Lifestyle °· Do not use any tobacco products, including cigarettes, chewing tobacco, or electronic cigarettes. If you need help quitting, ask your health care provider. °· If you drink alcohol, and your health care provider approves, limit your alcohol intake to no more than 1 drink per day. One drink equals 12 ounces of beer, 5 ounces of wine, or 1½ ounces of hard liquor. °· Learn to manage stress. °· Maintain a healthy weight. Lose weight as approved by your health care provider. °General Instructions °· Manage other health conditions, such as hypertension and diabetes, as directed by your health care provider. °· Keep all follow-up visits as directed by your health care provider. This is important. °· Your health care provider may ask you to monitor your blood pressure. A blood pressure reading consists of a higher number over a lower number, such as 110 over 72, written as 110/72. Ideally, your blood pressure should be: °¨ Below 140/90 if you have no other medical conditions. °¨ Below 130/80 if you have diabetes or kidney disease. °SEEK IMMEDIATE MEDICAL CARE IF: °· You   have pain in your chest, neck, arm, jaw, stomach, or back that lasts more than a few minutes, is recurring, or is not relieved by taking medicine under your tongue (sublingual nitroglycerin). °· You have profuse sweating without cause. °· You have unexplained: °¨ Heartburn or indigestion. °¨ Shortness of breath or difficulty breathing. °¨ Nausea or vomiting. °¨ Fatigue. °¨ Feelings of nervousness or anxiety. °¨ Weakness. °¨ Diarrhea. °· You have sudden light-headedness or dizziness. °· You faint. °These symptoms may represent a serious problem that is an emergency. Do not wait to see if the symptoms will go away. Get medical help right away. Call your local emergency  services (911 in the [place] Do not drive yourself to the clinic or hospital. °  °This information is not intended to replace advice given to you by your health care provider. Make sure you discuss any questions you have with your health care provider. °  °Document Released: 05/20/2005 Document Revised: 06/10/2014 Document Reviewed: 09/21/2013 °Elsevier Interactive Patient Education ©2016 [employer]. ° °

## 2015-08-04 NOTE — Discharge Summary (Signed)
Discharge summary dictated on 08/04/2015, dictation number is [phone]

## 2015-08-04 NOTE — Progress Notes (Signed)
ANTICOAGULATION CONSULT NOTE - Follow Up Consult  Pharmacy Consult for Heparin  Indication: chest pain/ACS  Allergies  Allergen Reactions  . Other Rash    polyestor and metals except titanium    Patient Measurements: Weight: 132 lb 4.4 oz (60 kg) Vital Signs: Temp: 98.7 F (37.1 C) (03/02 2056) Temp Source: Oral (03/02 2056) BP: 104/69 mmHg (03/03 0200) Pulse Rate: 52 (03/03 0200)  Labs:  Recent Labs  08/03/15 1720 08/04/15 0136 08/04/15 0138  HGB 13.2 11.3*  --   HCT 38.8* 33.1*  --   PLT 164 152  --   LABPROT  --  13.1  --   INR  --  0.97  --   HEPARINUNFRC  --   --  0.77*  CREATININE 1.21 1.26*  --   TROPONINI  --  <0.03  --     Estimated Creatinine Clearance: 50.9 mL/min (by C-G formula based on Cr of 1.26).   Assessment: 65 y/o M on heparin for CP, initial heparin level is slightly high at 0.77, heparin level was drawn a little early  Goal of Therapy:  Heparin level 0.3-0.7 units/ml Monitor platelets by anticoagulation protocol: Yes   Plan:  -Decrease heparin slightly to 650 units/hr -1100 HL  Justice Aguirre 08/04/2015,2:34 AM

## 2015-08-04 NOTE — Progress Notes (Signed)
Site area: rt groin fa sheath Site Prior to Removal:  Level 0 Pressure Applied For:  25 minutes Manual:   yes Patient Status During Pull:  stable Post Pull Site:  Level  0 Post Pull Instructions Given:  yes Post Pull Pulses Present: yes  Dressing Applied:  tegaderm Bedrest begins @  1240 Comments:

## 2015-08-05 LAB — HEMOGLOBIN A1C
Hgb A1c MFr Bld: 5.3 % (ref 4.8–5.6)
Mean Plasma Glucose: 105 mg/dL

## 2015-08-15 ENCOUNTER — Emergency Department (HOSPITAL_COMMUNITY): Payer: 59

## 2015-08-15 ENCOUNTER — Encounter (HOSPITAL_COMMUNITY): Payer: Self-pay | Admitting: Emergency Medicine

## 2015-08-15 ENCOUNTER — Inpatient Hospital Stay (HOSPITAL_COMMUNITY)
Admission: EM | Admit: 2015-08-15 | Discharge: 2015-08-22 | DRG: 727 | Disposition: A | Discharge status: Skilled Nursing Facility | Payer: 59 | Attending: Internal Medicine | Admitting: Internal Medicine

## 2015-08-15 DIAGNOSIS — E46 Unspecified protein-calorie malnutrition: Secondary | ICD-10-CM | POA: Diagnosis not present

## 2015-08-15 DIAGNOSIS — Z8049 Family history of malignant neoplasm of other genital organs: Secondary | ICD-10-CM | POA: Diagnosis not present

## 2015-08-15 DIAGNOSIS — N5089 Other specified disorders of the male genital organs: Secondary | ICD-10-CM | POA: Diagnosis not present

## 2015-08-15 DIAGNOSIS — F1020 Alcohol dependence, uncomplicated: Secondary | ICD-10-CM | POA: Diagnosis present

## 2015-08-15 DIAGNOSIS — I70229 Atherosclerosis of native arteries of extremities with rest pain, unspecified extremity: Secondary | ICD-10-CM | POA: Diagnosis not present

## 2015-08-15 DIAGNOSIS — D696 Thrombocytopenia, unspecified: Secondary | ICD-10-CM | POA: Diagnosis present

## 2015-08-15 DIAGNOSIS — N492 Inflammatory disorders of scrotum: Principal | ICD-10-CM | POA: Diagnosis present

## 2015-08-15 DIAGNOSIS — R1084 Generalized abdominal pain: Secondary | ICD-10-CM

## 2015-08-15 DIAGNOSIS — D631 Anemia in chronic kidney disease: Secondary | ICD-10-CM | POA: Diagnosis present

## 2015-08-15 DIAGNOSIS — L120 Bullous pemphigoid: Secondary | ICD-10-CM | POA: Diagnosis present

## 2015-08-15 DIAGNOSIS — Z9079 Acquired absence of other genital organ(s): Secondary | ICD-10-CM | POA: Diagnosis not present

## 2015-08-15 DIAGNOSIS — I252 Old myocardial infarction: Secondary | ICD-10-CM

## 2015-08-15 DIAGNOSIS — T380X5A Adverse effect of glucocorticoids and synthetic analogues, initial encounter: Secondary | ICD-10-CM | POA: Diagnosis present

## 2015-08-15 DIAGNOSIS — Z6820 Body mass index (BMI) 20.0-20.9, adult: Secondary | ICD-10-CM

## 2015-08-15 DIAGNOSIS — N179 Acute kidney failure, unspecified: Secondary | ICD-10-CM | POA: Diagnosis present

## 2015-08-15 DIAGNOSIS — Z8546 Personal history of malignant neoplasm of prostate: Secondary | ICD-10-CM | POA: Diagnosis not present

## 2015-08-15 DIAGNOSIS — Z7982 Long term (current) use of aspirin: Secondary | ICD-10-CM | POA: Diagnosis not present

## 2015-08-15 DIAGNOSIS — E871 Hypo-osmolality and hyponatremia: Secondary | ICD-10-CM | POA: Diagnosis present

## 2015-08-15 DIAGNOSIS — I739 Peripheral vascular disease, unspecified: Secondary | ICD-10-CM | POA: Diagnosis present

## 2015-08-15 DIAGNOSIS — D649 Anemia, unspecified: Secondary | ICD-10-CM | POA: Diagnosis not present

## 2015-08-15 DIAGNOSIS — R109 Unspecified abdominal pain: Secondary | ICD-10-CM

## 2015-08-15 DIAGNOSIS — I2510 Atherosclerotic heart disease of native coronary artery without angina pectoris: Secondary | ICD-10-CM | POA: Diagnosis present

## 2015-08-15 DIAGNOSIS — Z87891 Personal history of nicotine dependence: Secondary | ICD-10-CM | POA: Diagnosis not present

## 2015-08-15 DIAGNOSIS — N189 Chronic kidney disease, unspecified: Secondary | ICD-10-CM | POA: Diagnosis present

## 2015-08-15 DIAGNOSIS — E43 Unspecified severe protein-calorie malnutrition: Secondary | ICD-10-CM | POA: Diagnosis present

## 2015-08-15 DIAGNOSIS — N5082 Scrotal pain: Secondary | ICD-10-CM

## 2015-08-15 DIAGNOSIS — N433 Hydrocele, unspecified: Secondary | ICD-10-CM | POA: Diagnosis present

## 2015-08-15 LAB — BRAIN NATRIURETIC PEPTIDE: B Natriuretic Peptide: 365.2 pg/mL — ABNORMAL HIGH (ref 0.0–100.0)

## 2015-08-15 LAB — COMPREHENSIVE METABOLIC PANEL
ALBUMIN: 2.1 g/dL — AB (ref 3.5–5.0)
ALT: 35 U/L (ref 17–63)
ANION GAP: 9 (ref 5–15)
AST: 26 U/L (ref 15–41)
Alkaline Phosphatase: 82 U/L (ref 38–126)
BUN: 15 mg/dL (ref 6–20)
CHLORIDE: 92 mmol/L — AB (ref 101–111)
CO2: 22 mmol/L (ref 22–32)
Calcium: 8.2 mg/dL — ABNORMAL LOW (ref 8.9–10.3)
Creatinine, Ser: 1.86 mg/dL — ABNORMAL HIGH (ref 0.61–1.24)
GFR calc Af Amer: 43 mL/min — ABNORMAL LOW (ref >60)
GFR calc non Af Amer: 37 mL/min — ABNORMAL LOW (ref >60)
GLUCOSE: 91 mg/dL (ref 65–99)
POTASSIUM: 4 mmol/L (ref 3.5–5.1)
SODIUM: 123 mmol/L — AB (ref 135–145)
TOTAL PROTEIN: 4.8 g/dL — AB (ref 6.5–8.1)
Total Bilirubin: 0.8 mg/dL (ref 0.3–1.2)

## 2015-08-15 LAB — CBC WITH DIFFERENTIAL/PLATELET
BASOS ABS: 0 10*3/uL (ref 0.0–0.1)
Basophils Relative: 0 %
EOS ABS: 0 10*3/uL (ref 0.0–0.7)
Eosinophils Relative: 0 %
HEMATOCRIT: 26 % — AB (ref 39.0–52.0)
Hemoglobin: 9.1 g/dL — ABNORMAL LOW (ref 13.0–17.0)
LYMPHS ABS: 0.6 10*3/uL — AB (ref 0.7–4.0)
Lymphocytes Relative: 7 %
MCH: 32.2 pg (ref 26.0–34.0)
MCHC: 35 g/dL (ref 30.0–36.0)
MCV: 91.9 fL (ref 78.0–100.0)
MONOS PCT: 6 %
Monocytes Absolute: 0.5 10*3/uL (ref 0.1–1.0)
NEUTROS ABS: 7.6 10*3/uL (ref 1.7–7.7)
Neutrophils Relative %: 87 %
PLATELETS: 80 10*3/uL — AB (ref 150–400)
RBC: 2.83 MIL/uL — AB (ref 4.22–5.81)
RDW: 14 % (ref 11.5–15.5)
WBC: 8.7 10*3/uL (ref 4.0–10.5)

## 2015-08-15 LAB — ETHANOL

## 2015-08-15 LAB — URINALYSIS, ROUTINE W REFLEX MICROSCOPIC
Bilirubin Urine: NEGATIVE
Glucose, UA: 250 mg/dL — AB
Ketones, ur: NEGATIVE mg/dL
NITRITE: NEGATIVE
PROTEIN: NEGATIVE mg/dL
SPECIFIC GRAVITY, URINE: 1.031 — AB (ref 1.005–1.030)
pH: 6 (ref 5.0–8.0)

## 2015-08-15 LAB — URINE MICROSCOPIC-ADD ON

## 2015-08-15 LAB — APTT: APTT: 28 s (ref 24–37)

## 2015-08-15 LAB — RAPID URINE DRUG SCREEN, HOSP PERFORMED
AMPHETAMINES: NOT DETECTED
BARBITURATES: NOT DETECTED
BENZODIAZEPINES: NOT DETECTED
Cocaine: NOT DETECTED
OPIATES: NOT DETECTED
Tetrahydrocannabinol: NOT DETECTED

## 2015-08-15 LAB — PROTIME-INR
INR: 0.9 (ref 0.00–1.49)
Prothrombin Time: 12.4 seconds (ref 11.6–15.2)

## 2015-08-15 LAB — I-STAT CG4 LACTIC ACID, ED: LACTIC ACID, VENOUS: 1.31 mmol/L (ref 0.5–2.0)

## 2015-08-15 LAB — I-STAT TROPONIN, ED: Troponin i, poc: 0.05 ng/mL (ref 0.00–0.08)

## 2015-08-15 MED ORDER — ACETAMINOPHEN 325 MG PO TABS: 650.0000 mg | ORAL_TABLET | Freq: Four times a day (QID) | ORAL | Status: DC | PRN

## 2015-08-15 MED ORDER — PANTOPRAZOLE SODIUM 40 MG PO TBEC
40.0000 mg | DELAYED_RELEASE_TABLET | Freq: Every day | ORAL | Status: DC
  Administered 2015-08-16 – 2015-08-22 (×7): 40 mg via ORAL
  Filled 2015-08-15 (×7): qty 1

## 2015-08-15 MED ORDER — PREDNISONE 5 MG PO TABS
10.0000 mg | ORAL_TABLET | Freq: Three times a day (TID) | ORAL | Status: DC
  Administered 2015-08-16 – 2015-08-18 (×9): 10 mg via ORAL
  Filled 2015-08-15 (×8): qty 2

## 2015-08-15 MED ORDER — ATORVASTATIN CALCIUM 40 MG PO TABS
80.0000 mg | ORAL_TABLET | Freq: Every day | ORAL | Status: DC
  Administered 2015-08-16 – 2015-08-17 (×2): 80 mg via ORAL
  Administered 2015-08-18: 40 mg via ORAL
  Administered 2015-08-19 – 2015-08-22 (×4): 80 mg via ORAL
  Filled 2015-08-15 (×7): qty 2

## 2015-08-15 MED ORDER — ASPIRIN EC 81 MG PO TBEC
81.0000 mg | DELAYED_RELEASE_TABLET | Freq: Every day | ORAL | Status: DC
  Administered 2015-08-16 – 2015-08-22 (×7): 81 mg via ORAL
  Filled 2015-08-15 (×7): qty 1

## 2015-08-15 MED ORDER — PIPERACILLIN-TAZOBACTAM 3.375 G IVPB 30 MIN
3.3750 g | Freq: Once | INTRAVENOUS | Status: AC
  Administered 2015-08-15: 3.375 g via INTRAVENOUS
  Filled 2015-08-15: qty 50

## 2015-08-15 MED ORDER — SODIUM CHLORIDE 0.9 % IV BOLUS (SEPSIS)
500.0000 mL | Freq: Once | INTRAVENOUS | Status: AC
  Administered 2015-08-15: 500 mL via INTRAVENOUS

## 2015-08-15 MED ORDER — LATANOPROST 0.005 % OP SOLN
1.0000 [drp] | Freq: Every day | OPHTHALMIC | Status: DC
  Administered 2015-08-16 – 2015-08-21 (×6): 1 [drp] via OPHTHALMIC
  Filled 2015-08-15: qty 2.5

## 2015-08-15 MED ORDER — VANCOMYCIN HCL IN DEXTROSE 1-5 GM/200ML-% IV SOLN
1000.0000 mg | Freq: Once | INTRAVENOUS | Status: AC
  Administered 2015-08-15: 1000 mg via INTRAVENOUS
  Filled 2015-08-15: qty 200

## 2015-08-15 MED ORDER — HYDROCODONE-ACETAMINOPHEN 5-325 MG PO TABS
1.0000 | ORAL_TABLET | ORAL | Status: DC | PRN
  Administered 2015-08-17 – 2015-08-21 (×11): 2 via ORAL
  Filled 2015-08-15 (×12): qty 2

## 2015-08-15 MED ORDER — SODIUM CHLORIDE 0.9 % IV SOLN
INTRAVENOUS | Status: DC
  Administered 2015-08-16 – 2015-08-17 (×3): via INTRAVENOUS
  Administered 2015-08-17: 1000 mL via INTRAVENOUS

## 2015-08-15 MED ORDER — IOHEXOL 300 MG/ML  SOLN
100.0000 mL | Freq: Once | INTRAMUSCULAR | Status: AC | PRN
  Administered 2015-08-15: 100 mL via INTRAVENOUS

## 2015-08-15 MED ORDER — METOPROLOL SUCCINATE ER 25 MG PO TB24
25.0000 mg | ORAL_TABLET | Freq: Two times a day (BID) | ORAL | Status: DC
  Administered 2015-08-16 – 2015-08-22 (×13): 25 mg via ORAL
  Filled 2015-08-15 (×13): qty 1

## 2015-08-15 MED ORDER — ACETAMINOPHEN 650 MG RE SUPP: 650.0000 mg | Freq: Four times a day (QID) | RECTAL | Status: DC | PRN

## 2015-08-15 MED ORDER — DIFLUPREDNATE 0.05 % OP EMUL: 1.0000 [drp] | Freq: Two times a day (BID) | OPHTHALMIC | Status: DC

## 2015-08-15 MED ORDER — SODIUM CHLORIDE 0.9 % IV BOLUS (SEPSIS)
1000.0000 mL | Freq: Once | INTRAVENOUS | Status: AC
  Administered 2015-08-15: 1000 mL via INTRAVENOUS

## 2015-08-15 NOTE — ED Notes (Addendum)
PHLEBOTOMY FROM MAIN LAB WAS CALLED TO OBTAIN BLOOD.

## 2015-08-15 NOTE — ED Notes (Signed)
Per [facility], pt from home reports lower left abd pain , sharp , x 3 days. Denies nausea nor emesis. Pt reports worse pain with motion. Hx prostate Cancer . denies urinary symptoms. denies chest pain nor SOB. Alert and oriented x4.  CBG 189 BY ems. 134/90 BP, 86 HR.

## 2015-08-15 NOTE — ED Provider Notes (Signed)
This patient's care was assumed from [place], [place] at shift change. Please see his note for further.  Briefly, the patient presented with 4 days of scrotal swelling and pain. Patient received vancomycin and Zosyn in the emergency department. At shift change patient is pending imaging studies and blood work.  It appears the patient was previously on chronic prednisone but this was stopped approximately 5 days ago.  CMP reveals a hyponatremia with a sodium of 123. Creatinine is also elevated at 1.86. Patient received 2 L fluid bolus in the emergency department. No leukocytosis. Scrotal ultrasound shows diffuse scrotal skin thickening and edema with bilateral complex hydroceles. No testicular abnormality. CT abdomen and pelvis with contrast revealed significant soft tissue swelling of the scrotum with question bilateral hydroceles. No acute intra-abdominal or intrapelvic abnormalities otherwise seen.  Abdomen mildly evaluation patient has significant scrotal edema and erythema is extending into his groin. No crepitus or evidence of Fournier's gangrene currently. Patient is able to urinate. He is uncircumcised. Will consult with urology and for admission. Patient is in agreement with admission.   I consulted with urologist Dr. Louis Meckel who will have the resident come by and see the patient. Will admit to medicine.   I spoke with Dr. Loleta Books from [facility] who accepted the patient for admission and requested Med surg bed.   This patient was discussed with and evaluated by Dr. Lacinda Axon who agrees with assessment and plan.   Results for orders placed or performed during the hospital encounter of 08/15/15  CBC with Differential  Result Value Ref Range   WBC 8.7 4.0 - 10.5 K/uL   RBC 2.83 (L) 4.22 - 5.81 MIL/uL   Hemoglobin 9.1 (L) 13.0 - 17.0 g/dL   HCT 26.0 (L) 39.0 - 52.0 %   MCV 91.9 78.0 - 100.0 fL   MCH 32.2 26.0 - 34.0 pg   MCHC 35.0 30.0 - 36.0 g/dL   RDW 14.0 11.5 - 15.5 %    Platelets 80 (L) 150 - 400 K/uL   Neutrophils Relative % 87 %   Lymphocytes Relative 7 %   Monocytes Relative 6 %   Eosinophils Relative 0 %   Basophils Relative 0 %   Neutro Abs 7.6 1.7 - 7.7 K/uL   Lymphs Abs 0.6 (L) 0.7 - 4.0 K/uL   Monocytes Absolute 0.5 0.1 - 1.0 K/uL   Eosinophils Absolute 0.0 0.0 - 0.7 K/uL   Basophils Absolute 0.0 0.0 - 0.1 K/uL   RBC Morphology POLYCHROMASIA PRESENT   Comprehensive metabolic panel  Result Value Ref Range   Sodium 123 (L) 135 - 145 mmol/L   Potassium 4.0 3.5 - 5.1 mmol/L   Chloride 92 (L) 101 - 111 mmol/L   CO2 22 22 - 32 mmol/L   Glucose, Bld 91 65 - 99 mg/dL   BUN 15 6 - 20 mg/dL   Creatinine, Ser 1.86 (H) 0.61 - 1.24 mg/dL   Calcium 8.2 (L) 8.9 - 10.3 mg/dL   Total Protein 4.8 (L) 6.5 - 8.1 g/dL   Albumin 2.1 (L) 3.5 - 5.0 g/dL   AST 26 15 - 41 U/L   ALT 35 17 - 63 U/L   Alkaline Phosphatase 82 38 - 126 U/L   Total Bilirubin 0.8 0.3 - 1.2 mg/dL   GFR calc non Af Amer 37 (L) >60 mL/min   GFR calc Af Amer 43 (L) >60 mL/min   Anion gap 9 5 - 15  Protime-INR  Result Value Ref Range  Prothrombin Time 12.4 11.6 - 15.2 seconds   INR 0.90 0.00 - 1.49  APTT  Result Value Ref Range   aPTT 28 24 - 37 seconds  Urinalysis, Routine w reflex microscopic (not at [facility])  Result Value Ref Range   Color, Urine YELLOW YELLOW   APPearance CLOUDY (A) CLEAR   Specific Gravity, Urine 1.031 (H) 1.005 - 1.030   pH 6.0 5.0 - 8.0   Glucose, UA 250 (A) NEGATIVE mg/dL   Hgb urine dipstick SMALL (A) NEGATIVE   Bilirubin Urine NEGATIVE NEGATIVE   Ketones, ur NEGATIVE NEGATIVE mg/dL   Protein, ur NEGATIVE NEGATIVE mg/dL   Nitrite NEGATIVE NEGATIVE   Leukocytes, UA MODERATE (A) NEGATIVE  Urine rapid drug screen (hosp performed)  Result Value Ref Range   Opiates NONE DETECTED NONE DETECTED   Cocaine NONE DETECTED NONE DETECTED   Benzodiazepines NONE DETECTED NONE DETECTED   Amphetamines NONE DETECTED NONE DETECTED   Tetrahydrocannabinol NONE DETECTED  NONE DETECTED   Barbiturates NONE DETECTED NONE DETECTED  Ethanol  Result Value Ref Range   Alcohol, Ethyl (B) <5 <5 mg/dL  Brain natriuretic peptide  Result Value Ref Range   B Natriuretic Peptide 365.2 (H) 0.0 - 100.0 pg/mL  Urine microscopic-add on  Result Value Ref Range   Squamous Epithelial / LPF 0-5 (A) NONE SEEN   WBC, UA 6-30 0 - 5 WBC/hpf   RBC / HPF 6-30 0 - 5 RBC/hpf   Bacteria, UA FEW (A) NONE SEEN   Urine-Other YEAST PRESENT   I-Stat CG4 Lactic Acid, ED  Result Value Ref Range   Lactic Acid, Venous 1.31 0.5 - 2.0 mmol/L  I-stat troponin, ED  Result Value Ref Range   Troponin i, poc 0.05 0.00 - 0.08 ng/mL   Comment 3           Dg Chest 2 View  08/15/2015  CLINICAL DATA:  Shortness of breath. EXAM: CHEST  2 VIEW COMPARISON:  August 03, 2015. FINDINGS: The heart size and mediastinal contours are within normal limits. Both lungs are clear. No pneumothorax or pleural effusion is noted. The visualized skeletal structures are unremarkable. IMPRESSION: No active cardiopulmonary disease. Electronically Signed   By: Marijo Conception, M.D.   On: 08/15/2015 15:42   Dg Chest 2 View  08/03/2015  CLINICAL DATA:  Chest pain starting today. Myocardial infarction in 2009. EXAM: CHEST  2 VIEW COMPARISON:  02/16/2015 FINDINGS: Emphysema. Mild atherosclerotic calcification of the aortic arch. Heart size within normal limits. Left lateral ninth and tenth rib irregularities, probably old fractures but not readily visible on 02/09/2015 CT, correlate with any point tenderness along the left lower chest. Biapical pleural parenchymal scarring.  No pleural effusion. IMPRESSION: 1. Left lateral ninth and tenth rib irregularities probably from old fractures, correlate with any point tenderness in this vicinity in assessing for more recent fracture. 2. Emphysema. 3. Atherosclerotic aortic arch. Electronically Signed   By: Van Clines M.D.   On: 08/03/2015 16:55   US Scrotum  08/15/2015  CLINICAL  DATA:  Enlarged painful, inflamed scrotum EXAM: ULTRASOUND OF SCROTUM TECHNIQUE: Complete ultrasound examination of the testicles, epididymis, and other scrotal structures was performed. COMPARISON:  None available FINDINGS: Right testicle Measurements: 3.2 x 2.2 x 2.3 cm. No mass or microlithiasis visualized. Left testicle Measurements: 3.2 x 1.8 x 2.3 cm. No mass or microlithiasis visualized. Right epididymis: Enlarged, heterogeneous in appearance, no significant hypervascularity. Left epididymis: Small left epididymal incidental cyst measures 3 mm. Left epididymis also appears  enlarged, heterogeneous and ill-defined. Hydrocele: Heterogeneous complex mixed echogenicity hydroceles present bilaterally with diffuse scrotal edema and skin thickening, difficult to exclude pyocele / infection. Varicocele:  None visualized. IMPRESSION: Diffuse scrotal skin thickening and edema with bilateral complex heterogeneous hydroceles and/or pyoceles. Difficult to exclude abscess. No testicular abnormality. Electronically Signed   By: Jerilynn Mages.  Shick M.D.   On: 08/15/2015 16:42   Ct Abdomen Pelvis W Contrast  08/15/2015  CLINICAL DATA:  Groin pain with walking and moving since 08/10/2015, swelling, dysuria, on Cipro for possible UTI, pain is burning and non radiating, no fever or chills, history prostate cancer, former smoker EXAM: CT ABDOMEN AND PELVIS WITH CONTRAST TECHNIQUE: Multidetector CT imaging of the abdomen and pelvis was performed using the standard protocol following bolus administration of intravenous contrast. Sagittal and coronal MPR images reconstructed from axial data set. CONTRAST:  177mL OMNIPAQUE IOHEXOL 300 MG/ML SOLN IV. Dilute oral contrast. COMPARISON:  CT abdomen and pelvis 02/09/2015 FINDINGS: Lung bases clear. Atrophic pancreas. Liver, gallbladder, spleen, pancreas, kidneys, and adrenal glands otherwise unremarkable. Extensive atherosclerotic calcification greatest at the iliac arteries. Coronary arterial  calcifications as well. 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 Normal appendix. Unremarkable bladder and ureters. Prostate gland surgically absent. Question small BILATERAL inguinal hernias containing fat. Scattered edema within the subcutaneous soft tissues at the inferior pelvis particularly anteriorly. Significant soft tissue swelling/ edema and question hydroceles in scrotum. Stomach and bowel loops normal appearance. No mass, adenopathy, free air, or free fluid. No acute osseous lesions. IMPRESSION: Question small BILATERAL inguinal hernias containing fat. Significant soft tissue swelling of the scrotum with question BILATERAL hydroceles. No acute intra-abdominal or intrapelvic abnormalities otherwise seen. Extensive atherosclerotic calcification as above. Electronically Signed   By: Lavonia Dana M.D.   On: 08/15/2015 17:25     Meds given in ED:  Medications  vancomycin (VANCOCIN) IVPB 1000 mg/200 mL premix (0 mg Intravenous Stopped 08/15/15 1721)  piperacillin-tazobactam (ZOSYN) IVPB 3.375 g (0 g Intravenous Stopped 08/15/15 1635)  sodium chloride 0.9 % bolus 500 mL (0 mLs Intravenous Stopped 08/15/15 1705)  iohexol (OMNIPAQUE) 300 MG/ML solution 100 mL (100 mLs Intravenous Contrast Given 08/15/15 1657)  sodium chloride 0.9 % bolus 1,000 mL (1,000 mLs Intravenous New Bag/Given 08/15/15 2036)    New Prescriptions   No medications on file    Filed Vitals:   08/15/15 1935 08/15/15 2030 08/15/15 2100 08/15/15 2130  BP: 117/83  118/83 103/87  Pulse: 72 73 87 78  Temp:      TempSrc:      Resp: 18  18 18   SpO2: 100% 100% 100% 100%    Diagnosis:  Cellulitis of scrotum  Scrotal pain  Scrotal swelling  AKI (acute kidney injury) ([place])  Hyponatremia    Waynetta Pean, PA-C 08/15/15 2214  Nat Christen, MD 08/16/15 1752

## 2015-08-15 NOTE — ED Provider Notes (Signed)
CSN: [id number]     Arrival date & time 08/15/15  1307 History   First MD Initiated Contact with Patient 08/15/15 1444     Chief Complaint  Patient presents with  . Abdominal Pain     (Consider location/radiation/quality/duration/timing/severity/associated sxs/prior Treatment) HPI   Rodney [name] is a 65 y.o. male, with a history of prostate cancer, presenting to the ED with pain in the groin upon walking or moving since 08/10/15. Pt also endorses swelling to the area and dysuria. Pt was prescribed Cipro for a possible UTI, also on 3/9. Pt states his groin pain reduces to zero when he is still, but it rises to 8/10 with movement. Described as burning and nonradiating. Pt has not taken anything for pain. Pt denies fever/chills, abdominal pain, N/V, penile discharge, or any other complaints. Pt is accompanied by his niece and brother in law at the bedside.      Past Medical History  Diagnosis Date  . Prostate cancer ([place]) 05/03/2009    Prostatectomy/Adenocrcinoma  . Heart murmur     history  . Cataract     left eye surgery/   . Radiation 07/02/11-08/22/11    Prostate fossa 68.4 gray 38 fractions   Past Surgical History  Procedure Laterality Date  . Left eye surgery    . Hemorrhoid surgery    . Robot assisted laparoscopic radical prostatectomy  05/03/2009  . Wrist surgery      left  . Left heart catheterization with coronary angiogram N/A 02/25/2012    Procedure: LEFT HEART CATHETERIZATION WITH CORONARY ANGIOGRAM;  Surgeon: Clent Demark, MD;  Location: [facility] CATH LAB;  Service: Cardiovascular;  Laterality: N/A;  . Peripheral vascular catheterization N/A 02/10/2015    Procedure: Abdominal Aortogram;  Surgeon: Elam Dutch, MD;  Location: [place] CV LAB;  Service: Cardiovascular;  Laterality: N/A;  . Peripheral vascular catheterization Bilateral 02/10/2015    Procedure: Lower Extremity Angiography;  Surgeon: Elam Dutch, MD;  Location: [place] [facility] LAB;  Service:  Cardiovascular;  Laterality: Bilateral;  . Endarterectomy femoral Left 02/17/2015    Procedure: LEFT FEMORAl ENDARTERECTOMY ;  Surgeon: Angelia Mould, MD;  Location: [place];  Service: Vascular;  Laterality: Left;  . Patch angioplasty Left 02/17/2015    Procedure: VEIN PATCH ANGIOPLASTY;  Surgeon: Angelia Mould, MD;  Location: [place];  Service: Vascular;  Laterality: Left;  . Cardiac catheterization N/A 08/04/2015    Procedure: Left Heart Cath and Coronary Angiography;  Surgeon: Charolette Forward, MD;  Location: [place] [facility] LAB;  Service: Cardiovascular;  Laterality: N/A;   Family History  Problem Relation Age of Onset  . Uterine cancer Mother 76    going to [employer]  . Breast cancer Sister    Social History  Substance Use Topics  . Smoking status: Former Smoker -- 2.00 packs/day for 36 years    Types: Cigarettes    Quit date: 12/08/2014  . Smokeless tobacco: Never Used  . Alcohol Use: 3.6 oz/week    6 Cans of beer per week     Comment: 6-7  cans drinks daily 12 oz    Review of Systems  Constitutional: Negative for fever, chills and diaphoresis.  Gastrointestinal: Negative for nausea, vomiting, abdominal pain, diarrhea and constipation.  Genitourinary: Positive for dysuria, penile swelling, scrotal swelling, genital sores, penile pain and testicular pain. Negative for hematuria.  All other systems reviewed and are negative.     Allergies  Other  Home Medications   Prior to  Admission medications   Medication Sig Start Date End Date Taking? Authorizing Provider  aspirin EC 81 MG EC tablet Take 1 tablet (81 mg total) by mouth daily. 02/27/12  Yes Charolette Forward, MD  atorvastatin (LIPITOR) 80 MG tablet Take 1 tablet (80 mg total) by mouth daily at 6 PM. 02/27/12  Yes Charolette Forward, MD  ciprofloxacin (CIPRO) 500 MG tablet Take 500 mg by mouth 2 (two) times daily. Start on 08/10/14 for seven days 08/10/15  Yes Historical Provider, MD  DUREZOL 0.05 % EMUL Place 1 drop into the  right eye 2 (two) times daily. 07/10/15  Yes Historical Provider, MD  latanoprost (XALATAN) 0.005 % ophthalmic solution Place 1 drop into both eyes at bedtime.   Yes Historical Provider, MD  metoprolol succinate (TOPROL-XL) 25 MG 24 hr tablet Take 25 mg by mouth 2 (two) times daily.   Yes Historical Provider, MD  nitroGLYCERIN (NITROSTAT) 0.4 MG SL tablet Place 1 tablet (0.4 mg total) under the tongue every 5 (five) minutes x 3 doses as needed for chest pain. 02/27/12  Yes Charolette Forward, MD  pantoprazole (PROTONIX) 40 MG tablet Take 1 tablet (40 mg total) by mouth daily. 08/04/15  Yes Charolette Forward, MD  predniSONE (DELTASONE) 10 MG tablet Take 10 mg by mouth 3 (three) times daily. 07/20/15  Yes Historical Provider, MD  ramipril (ALTACE) 5 MG capsule Take 5 mg by mouth daily.   Yes Historical Provider, MD   BP 128/70 mmHg  Pulse 72  Temp(Src) 98 F (36.7 C)  Resp 18  SpO2 100% Physical Exam  Constitutional: He appears well-developed and well-nourished. No distress.  HENT:  Head: Normocephalic and atraumatic.  Eyes: Conjunctivae and EOM are normal.  Surgical pupil in left eye.  Neck: Neck supple.  Cardiovascular: Normal rate, regular rhythm, normal heart sounds and intact distal pulses.   Pulmonary/Chest: Effort normal and breath sounds normal. No respiratory distress.  Abdominal: Soft. Bowel sounds are normal. There is no tenderness. There is no guarding.  Genitourinary:  Swelling and erythema to the scrotum, penis, and lower pelvis. Penile sore noted without discharge. White discoloration on the foreskin suggestive of possible candidiasis.  Musculoskeletal: He exhibits no edema or tenderness.  Lymphadenopathy:    He has no cervical adenopathy.  Neurological: He is alert.  Skin: Skin is warm and dry. He is not diaphoretic.  Psychiatric: He has a normal mood and affect. His behavior is normal.  Nursing note and vitals reviewed.   ED Course  Procedures (including critical care time) Labs  Review Labs Reviewed  CULTURE, BLOOD (ROUTINE X 2)  CULTURE, BLOOD (ROUTINE X 2)  URINE CULTURE  CBC WITH DIFFERENTIAL/PLATELET  COMPREHENSIVE METABOLIC PANEL  PROTIME-INR  APTT  URINALYSIS, ROUTINE W REFLEX MICROSCOPIC (NOT AT [facility])  URINE RAPID DRUG SCREEN, HOSP PERFORMED  ETHANOL  BRAIN NATRIURETIC PEPTIDE  I-STAT CG4 LACTIC ACID, ED  Randolm Idol, ED    Imaging Review Dg Chest 2 View  08/15/2015  CLINICAL DATA:  Shortness of breath. EXAM: CHEST  2 VIEW COMPARISON:  August 03, 2015. FINDINGS: The heart size and mediastinal contours are within normal limits. Both lungs are clear. No pneumothorax or pleural effusion is noted. The visualized skeletal structures are unremarkable. IMPRESSION: No active cardiopulmonary disease. Electronically Signed   By: Marijo Conception, M.D.   On: 08/15/2015 15:42   I have personally reviewed and evaluated these images and lab results as part of my medical decision-making.   EKG Interpretation None  MDM   Final diagnoses:  Scrotal pain  Scrotal swelling    KOLSTEN BESSLER presents with genital pain and swelling since March 9.  Findings and plan of care discussed with Julianne Rice, MD. Dr. Lita Mains personally evaluated and examined this patient.   This patient's presentation and exam are suggestive of cellulitis versus abscess. Appropriate labs were ordered and CT is indicated. This patient will need to be admitted for IV antibiotics and other medical management. Patient was offered pain management, but politely declined.  4:15 PM End of shift patient care handoff report given to Will Dansie, PA-C. Plan is to wait on the CT and lab results and then admit. Continue IV antibiotics. Admitting physician will be Dr. Sheran Fava.  Filed Vitals:   08/15/15 1323 08/15/15 1330  BP: 128/70   Pulse:  72  Temp:  98 F (36.7 C)  Resp:  18  SpO2:  100%     Lorayne Bender, PA-C 08/15/15 1617  Julianne Rice, MD 08/23/15 1755

## 2015-08-15 NOTE — ED Notes (Signed)
Bed: [id number] Expected date:  Expected time:  Means of arrival:  Comments: EMS- 65yo M, LLQ pain

## 2015-08-15 NOTE — Consult Note (Signed)
Urology Consult   Physician requesting consult: Nat Christen, MD   Reason for consult: Scrotal swelling  History of Present Illness: Rodney [name] is a 65 y.o. with complex medical history including prostate cancer s/p RALP in 2010, and significant PVD but significantly no DM. The patient has 5 days of painful scrotal swelling. He reports that his dermatologist gave him 14 days of Cipro which he has been taking with some improvement. The patient came in to the ED with persistent swelling and pain. While here he had an ultrasound and CT with no concerning findings but was found to have AKI with a cr of 1.8 from a baseline below 1, hyponatremia of 123 and anemia with a hgb of 9. The patient denies any urinary symptoms. A penile lesion was noted and the patient reports that this started yesterday which appears inconsistent with the wound.  He denies a history of voiding or storage urinary symptoms, hematuria, UTIs, STDs, urolithiasis, GU malignancy/trauma/surgery.  Past Medical History  Diagnosis Date  . Prostate cancer ([place]) 05/03/2009    Prostatectomy/Adenocrcinoma  . Heart murmur     history  . Cataract     left eye surgery/   . Radiation 07/02/11-08/22/11    Prostate fossa 68.4 gray 38 fractions    Past Surgical History  Procedure Laterality Date  . Left eye surgery    . Hemorrhoid surgery    . Robot assisted laparoscopic radical prostatectomy  05/03/2009  . Wrist surgery      left  . Left heart catheterization with coronary angiogram N/A 02/25/2012    Procedure: LEFT HEART CATHETERIZATION WITH CORONARY ANGIOGRAM;  Surgeon: Clent Demark, MD;  Location: [facility] CATH LAB;  Service: Cardiovascular;  Laterality: N/A;  . Peripheral vascular catheterization N/A 02/10/2015    Procedure: Abdominal Aortogram;  Surgeon: Elam Dutch, MD;  Location: [place] CV LAB;  Service: Cardiovascular;  Laterality: N/A;  . Peripheral vascular catheterization Bilateral 02/10/2015    Procedure: Lower Extremity  Angiography;  Surgeon: Elam Dutch, MD;  Location: [place] [facility];  Service: Cardiovascular;  Laterality: Bilateral;  . Endarterectomy femoral Left 02/17/2015    Procedure: LEFT FEMORAl ENDARTERECTOMY ;  Surgeon: Angelia Mould, MD;  Location: [place];  Service: Vascular;  Laterality: Left;  . Patch angioplasty Left 02/17/2015    Procedure: VEIN PATCH ANGIOPLASTY;  Surgeon: Angelia Mould, MD;  Location: [place];  Service: Vascular;  Laterality: Left;  . Cardiac catheterization N/A 08/04/2015    Procedure: Left Heart Cath and Coronary Angiography;  Surgeon: Charolette Forward, MD;  Location: [place] [facility] LAB;  Service: Cardiovascular;  Laterality: N/A;    Current Hospital Medications:  Home Meds:    Medication List    ASK your doctor about these medications        aspirin 81 MG EC tablet  Take 1 tablet (81 mg total) by mouth daily.     atorvastatin 80 MG tablet  Commonly known as:  LIPITOR  Take 1 tablet (80 mg total) by mouth daily at 6 PM.     ciprofloxacin 500 MG tablet  Commonly known as:  CIPRO  Take 500 mg by mouth 2 (two) times daily. Start on 08/10/14 for seven days     DUREZOL 0.05 % Emul  Generic drug:  Difluprednate  Place 1 drop into the right eye 2 (two) times daily.     latanoprost 0.005 % ophthalmic solution  Commonly known as:  XALATAN  Place 1 drop into both  eyes at bedtime.     metoprolol succinate 25 MG 24 hr tablet  Commonly known as:  TOPROL-XL  Take 25 mg by mouth 2 (two) times daily.     nitroGLYCERIN 0.4 MG SL tablet  Commonly known as:  NITROSTAT  Place 1 tablet (0.4 mg total) under the tongue every 5 (five) minutes x 3 doses as needed for chest pain.     pantoprazole 40 MG tablet  Commonly known as:  PROTONIX  Take 1 tablet (40 mg total) by mouth daily.     predniSONE 10 MG tablet  Commonly known as:  DELTASONE  Take 10 mg by mouth 3 (three) times daily.     ramipril 5 MG capsule  Commonly known as:  ALTACE  Take 5 mg by mouth  daily.        Scheduled Meds: Continuous Infusions: PRN Meds:.  Allergies:  Allergies  Allergen Reactions  . Other Rash    polyestor and metals except titanium    Family History  Problem Relation Age of Onset  . Uterine cancer Mother 24    going to [employer]  . Breast cancer Sister     Social History:  reports that he quit smoking about 8 months ago. His smoking use included Cigarettes. He has a 72 pack-year smoking history. He has never used smokeless tobacco. He reports that he drinks about 3.6 oz of alcohol per week. He reports that he does not use illicit drugs.  ROS: A complete review of systems was performed.  All systems are negative except for pertinent findings as noted.  Physical Exam:  Vital signs in last 24 hours: Temp:  [98 F (36.7 C)] 98 F (36.7 C) (03/14 1736) Pulse Rate:  [70-90] 90 (03/14 2243) Resp:  [18] 18 (03/14 2200) BP: (103-134)/(70-87) 113/77 mmHg (03/14 2200) SpO2:  [100 %] 100 % (03/14 2243) Constitutional:  Alert and oriented, No acute distress Cardiovascular: Regular rate and rhythm, No JVD Respiratory: Normal respiratory effort, Lungs clear bilaterally GI: Abdomen is soft, nontender, nondistended, no abdominal masses GU: uncircumcised phallus with penile wound of unclear origin without erythema or warmth. Diffusely swollen scrotum with excoriation consistent with reactive hydrocele. No evidence of abscess or fluctuant areas for possible drainage.  Lymphatic: No lymphadenopathy Neurologic: Grossly intact, no focal deficits Psychiatric: Normal mood and affect  Laboratory Data:   Recent Labs  08/15/15 1912  WBC 8.7  HGB 9.1*  HCT 26.0*  PLT 80*     Recent Labs  08/15/15 1912  NA 123*  K 4.0  CL 92*  GLUCOSE 91  BUN 15  CALCIUM 8.2*  CREATININE 1.86*     Results for orders placed or performed during the hospital encounter of 08/15/15 (from the past 24 hour(s))  I-stat troponin, ED     Status: None   Collection Time:  08/15/15  7:00 PM  Result Value Ref Range   Troponin i, poc 0.05 0.00 - 0.08 ng/mL   Comment 3          CBC with Differential     Status: Abnormal   Collection Time: 08/15/15  7:12 PM  Result Value Ref Range   WBC 8.7 4.0 - 10.5 K/uL   RBC 2.83 (L) 4.22 - 5.81 MIL/uL   Hemoglobin 9.1 (L) 13.0 - 17.0 g/dL   HCT 26.0 (L) 39.0 - 52.0 %   MCV 91.9 78.0 - 100.0 fL   MCH 32.2 26.0 - 34.0 pg   MCHC 35.0 30.0 - 36.0 g/dL  RDW 14.0 11.5 - 15.5 %   Platelets 80 (L) 150 - 400 K/uL   Neutrophils Relative % 87 %   Lymphocytes Relative 7 %   Monocytes Relative 6 %   Eosinophils Relative 0 %   Basophils Relative 0 %   Neutro Abs 7.6 1.7 - 7.7 K/uL   Lymphs Abs 0.6 (L) 0.7 - 4.0 K/uL   Monocytes Absolute 0.5 0.1 - 1.0 K/uL   Eosinophils Absolute 0.0 0.0 - 0.7 K/uL   Basophils Absolute 0.0 0.0 - 0.1 K/uL   RBC Morphology POLYCHROMASIA PRESENT   Comprehensive metabolic panel     Status: Abnormal   Collection Time: 08/15/15  7:12 PM  Result Value Ref Range   Sodium 123 (L) 135 - 145 mmol/L   Potassium 4.0 3.5 - 5.1 mmol/L   Chloride 92 (L) 101 - 111 mmol/L   CO2 22 22 - 32 mmol/L   Glucose, Bld 91 65 - 99 mg/dL   BUN 15 6 - 20 mg/dL   Creatinine, Ser 1.86 (H) 0.61 - 1.24 mg/dL   Calcium 8.2 (L) 8.9 - 10.3 mg/dL   Total Protein 4.8 (L) 6.5 - 8.1 g/dL   Albumin 2.1 (L) 3.5 - 5.0 g/dL   AST 26 15 - 41 U/L   ALT 35 17 - 63 U/L   Alkaline Phosphatase 82 38 - 126 U/L   Total Bilirubin 0.8 0.3 - 1.2 mg/dL   GFR calc non Af Amer 37 (L) >60 mL/min   GFR calc Af Amer 43 (L) >60 mL/min   Anion gap 9 5 - 15  Protime-INR     Status: None   Collection Time: 08/15/15  7:12 PM  Result Value Ref Range   Prothrombin Time 12.4 11.6 - 15.2 seconds   INR 0.90 0.00 - 1.49  APTT     Status: None   Collection Time: 08/15/15  7:12 PM  Result Value Ref Range   aPTT 28 24 - 37 seconds  Brain natriuretic peptide     Status: Abnormal   Collection Time: 08/15/15  7:13 PM  Result Value Ref Range   B  Natriuretic Peptide 365.2 (H) 0.0 - 100.0 pg/mL  I-Stat CG4 Lactic Acid, ED     Status: None   Collection Time: 08/15/15  7:27 PM  Result Value Ref Range   Lactic Acid, Venous 1.31 0.5 - 2.0 mmol/L  Ethanol     Status: None   Collection Time: 08/15/15  7:29 PM  Result Value Ref Range   Alcohol, Ethyl (B) <5 <5 mg/dL  Urinalysis, Routine w reflex microscopic (not at [facility])     Status: Abnormal   Collection Time: 08/15/15  7:45 PM  Result Value Ref Range   Color, Urine YELLOW YELLOW   APPearance CLOUDY (A) CLEAR   Specific Gravity, Urine 1.031 (H) 1.005 - 1.030   pH 6.0 5.0 - 8.0   Glucose, UA 250 (A) NEGATIVE mg/dL   Hgb urine dipstick SMALL (A) NEGATIVE   Bilirubin Urine NEGATIVE NEGATIVE   Ketones, ur NEGATIVE NEGATIVE mg/dL   Protein, ur NEGATIVE NEGATIVE mg/dL   Nitrite NEGATIVE NEGATIVE   Leukocytes, UA MODERATE (A) NEGATIVE  Urine rapid drug screen (hosp performed)     Status: None   Collection Time: 08/15/15  7:45 PM  Result Value Ref Range   Opiates NONE DETECTED NONE DETECTED   Cocaine NONE DETECTED NONE DETECTED   Benzodiazepines NONE DETECTED NONE DETECTED   Amphetamines NONE DETECTED NONE DETECTED  Tetrahydrocannabinol NONE DETECTED NONE DETECTED   Barbiturates NONE DETECTED NONE DETECTED  Urine microscopic-add on     Status: Abnormal   Collection Time: 08/15/15  7:45 PM  Result Value Ref Range   Squamous Epithelial / LPF 0-5 (A) NONE SEEN   WBC, UA 6-30 0 - 5 WBC/hpf   RBC / HPF 6-30 0 - 5 RBC/hpf   Bacteria, UA FEW (A) NONE SEEN   Urine-Other YEAST PRESENT    No results found for this or any previous visit (from the past 240 hour(s)).  Renal Function:  Recent Labs  08/15/15 1912  CREATININE 1.86*   Estimated Creatinine Clearance: 33.4 mL/min (by C-G formula based on Cr of 1.86).  Radiologic Imaging: Dg Chest 2 View  08/15/2015  CLINICAL DATA:  Shortness of breath. EXAM: CHEST  2 VIEW COMPARISON:  August 03, 2015. FINDINGS: The heart size and  mediastinal contours are within normal limits. Both lungs are clear. No pneumothorax or pleural effusion is noted. The visualized skeletal structures are unremarkable. IMPRESSION: No active cardiopulmonary disease. Electronically Signed   By: Marijo Conception, M.D.   On: 08/15/2015 15:42   US Scrotum  08/15/2015  CLINICAL DATA:  Enlarged painful, inflamed scrotum EXAM: ULTRASOUND OF SCROTUM TECHNIQUE: Complete ultrasound examination of the testicles, epididymis, and other scrotal structures was performed. COMPARISON:  None available FINDINGS: Right testicle Measurements: 3.2 x 2.2 x 2.3 cm. No mass or microlithiasis visualized. Left testicle Measurements: 3.2 x 1.8 x 2.3 cm. No mass or microlithiasis visualized. Right epididymis: Enlarged, heterogeneous in appearance, no significant hypervascularity. Left epididymis: Small left epididymal incidental cyst measures 3 mm. Left epididymis also appears enlarged, heterogeneous and ill-defined. Hydrocele: Heterogeneous complex mixed echogenicity hydroceles present bilaterally with diffuse scrotal edema and skin thickening, difficult to exclude pyocele / infection. Varicocele:  None visualized. IMPRESSION: Diffuse scrotal skin thickening and edema with bilateral complex heterogeneous hydroceles and/or pyoceles. Difficult to exclude abscess. No testicular abnormality. Electronically Signed   By: Jerilynn Mages.  Shick M.D.   On: 08/15/2015 16:42   Ct Abdomen Pelvis W Contrast  08/15/2015  CLINICAL DATA:  Groin pain with walking and moving since 08/10/2015, swelling, dysuria, on Cipro for possible UTI, pain is burning and non radiating, no fever or chills, history prostate cancer, former smoker EXAM: CT ABDOMEN AND PELVIS WITH CONTRAST TECHNIQUE: Multidetector CT imaging of the abdomen and pelvis was performed using the standard protocol following bolus administration of intravenous contrast. Sagittal and coronal MPR images reconstructed from axial data set. CONTRAST:  132mL  OMNIPAQUE IOHEXOL 300 MG/ML SOLN IV. Dilute oral contrast. COMPARISON:  CT abdomen and pelvis 02/09/2015 FINDINGS: Lung bases clear. Atrophic pancreas. Liver, gallbladder, spleen, pancreas, kidneys, and adrenal glands otherwise unremarkable. Extensive atherosclerotic calcification greatest at the iliac arteries. Coronary arterial calcifications as well. 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 Normal appendix. Unremarkable bladder and ureters. Prostate gland surgically absent. Question small BILATERAL inguinal hernias containing fat. Scattered edema within the subcutaneous soft tissues at the inferior pelvis particularly anteriorly. Significant soft tissue swelling/ edema and question hydroceles in scrotum. Stomach and bowel loops normal appearance. No mass, adenopathy, free air, or free fluid. No acute osseous lesions. IMPRESSION: Question small BILATERAL inguinal hernias containing fat. Significant soft tissue swelling of the scrotum with question BILATERAL hydroceles. No acute intra-abdominal or intrapelvic abnormalities otherwise seen. Extensive atherosclerotic calcification as above. Electronically Signed   By: Lavonia Dana M.D.   On: 08/15/2015 17:25    I independently reviewed  the above imaging studies.  Impression/Recommendation 65 y.o. male with bilateral scrotal swelling with excoriation, no evidence of abscess for drainage. Penile wound of unclear origin. The patient likely has reactive hydroceles with poor wound healing due to PVD, the Cipro he has been taking may be working but with poor wound care has not significantly improved.  - Elevate scrotum - Wound consult for penile lesion - Antibiotics, Cipro is appropriate with local care including scrotal elevation and wound care - No obstructive process for AKI - Serial exams while in house  Christell Faith 08/15/2015, 10:56 PM

## 2015-08-15 NOTE — H&P (Signed)
History and Physical  Patient Name: Rodney [name]     [id number]    DOB: 25-Aug-1950    DOA: 08/15/2015 Referring physician: Sula Rumple, PA-C PCP: Simona Huh, MD      Chief Complaint: Groin pain  HPI: Rodney [name] is a 65 y.o. male with a past medical history significant for CAD, prostate Ca s/p prostatectomy in 2010, and CKD who presents with groin pain.  The patient was admitted for observation for CP earlier this month and underwent LHC on 08/04/15.  About a week ago, he developed dysuria and was seen by his PCP 5 days ago and started on cipro for presumed UTI.  Since then, he has had worsening groin pain, scrotal swelling, and LLQ abdominal pain not improving with ciprofloxacin to the point that today he had pain with any movement, and so his niece and brother in law brought him to the ER.    In the ED, he was hemodynamically stable.  Na 123, K 4.0, Cr 1.86  (from baseline 1.2), WBC 8.7K, Hgb 9.1 (near baseline), and platelets 80.  Lactate normal.  UA showed scant pyuria and hematuria.  US scrotum suggested pyoceles.  CT pelvis with contrast showed scrotal cellulitis and possible pyoceles, without Fournier's.    Vancomycin and piperacillin-tazobactam were administered and the patient was discussed with Urology who recommended admission and consultation.     Review of Systems:  Pt complains of groin pain, abdominal pain, dysuria, pain with movement. Pt denies any fever, chills.  All other systems negative except as just noted or noted in the history of present illness.  Allergies  Allergen Reactions  . Other Rash    polyestor and metals except titanium    Prior to Admission medications   Medication Sig Start Date End Date Taking? Authorizing Provider  aspirin EC 81 MG EC tablet Take 1 tablet (81 mg total) by mouth daily. 02/27/12  Yes Charolette Forward, MD  atorvastatin (LIPITOR) 80 MG tablet Take 1 tablet (80 mg total) by mouth daily at 6 PM. 02/27/12  Yes Charolette Forward, MD   ciprofloxacin (CIPRO) 500 MG tablet Take 500 mg by mouth 2 (two) times daily. Start on 08/10/14 for seven days 08/10/15  Yes Historical Provider, MD  DUREZOL 0.05 % EMUL Place 1 drop into the right eye 2 (two) times daily. 07/10/15  Yes Historical Provider, MD  latanoprost (XALATAN) 0.005 % ophthalmic solution Place 1 drop into both eyes at bedtime.   Yes Historical Provider, MD  metoprolol succinate (TOPROL-XL) 25 MG 24 hr tablet Take 25 mg by mouth 2 (two) times daily.   Yes Historical Provider, MD  nitroGLYCERIN (NITROSTAT) 0.4 MG SL tablet Place 1 tablet (0.4 mg total) under the tongue every 5 (five) minutes x 3 doses as needed for chest pain. 02/27/12  Yes Charolette Forward, MD  pantoprazole (PROTONIX) 40 MG tablet Take 1 tablet (40 mg total) by mouth daily. 08/04/15  Yes Charolette Forward, MD  predniSONE (DELTASONE) 10 MG tablet Take 10 mg by mouth 3 (three) times daily. 07/20/15  Yes Historical Provider, MD  ramipril (ALTACE) 5 MG capsule Take 5 mg by mouth daily.   Yes Historical Provider, MD    Past Medical History  Diagnosis Date  . Prostate cancer ([place]) 05/03/2009    Prostatectomy/Adenocrcinoma  . Heart murmur     history  . Cataract     left eye surgery/   . Radiation 07/02/11-08/22/11    Prostate fossa 68.4 gray 38 fractions  Past Surgical History  Procedure Laterality Date  . Left eye surgery    . Hemorrhoid surgery    . Robot assisted laparoscopic radical prostatectomy  05/03/2009  . Wrist surgery      left  . Left heart catheterization with coronary angiogram N/A 02/25/2012    Procedure: LEFT HEART CATHETERIZATION WITH CORONARY ANGIOGRAM;  Surgeon: Clent Demark, MD;  Location: [facility] CATH LAB;  Service: Cardiovascular;  Laterality: N/A;  . Peripheral vascular catheterization N/A 02/10/2015    Procedure: Abdominal Aortogram;  Surgeon: Elam Dutch, MD;  Location: [facility] LAB;  Service: Cardiovascular;  Laterality: N/A;  . Peripheral vascular catheterization Bilateral 02/10/2015     Procedure: Lower Extremity Angiography;  Surgeon: Elam Dutch, MD;  Location: [place] LAB;  Service: Cardiovascular;  Laterality: Bilateral;  . Endarterectomy femoral Left 02/17/2015    Procedure: LEFT FEMORAl ENDARTERECTOMY ;  Surgeon: Angelia Mould, MD;  Location: [facility];  Service: Vascular;  Laterality: Left;  . Patch angioplasty Left 02/17/2015    Procedure: VEIN PATCH ANGIOPLASTY;  Surgeon: Angelia Mould, MD;  Location: [place];  Service: Vascular;  Laterality: Left;  . Cardiac catheterization N/A 08/04/2015    Procedure: Left Heart Cath and Coronary Angiography;  Surgeon: Charolette Forward, MD;  Location: [place] [facility] LAB;  Service: Cardiovascular;  Laterality: N/A;    Family history: family history includes Breast cancer in his sister; Uterine cancer (age of onset: 50) in his mother.  Social History: Patient lives alone.  He has family and neighbors that check on him.  He is a former smoker.  He does not have a POA.  He does not climb stairs, but does not require a cane or walker.       Physical Exam: BP 113/77 mmHg  Pulse 90  Temp(Src) 98 F (36.7 C) (Oral)  Resp 18  SpO2 100% General appearance: Frail adult male, alert and in no acute distress.   Eyes: Anicteric, conjunctiva pink, lids and lashes normal.  L iris deformity.     ENT: No nasal deformity, discharge, or epistaxis.  OP moist with innumerable small whitish plaques in lacy pattern on the tongue and palate, no redness or bleeding.   Lymph: No cervical or supraclavicular lymphadenopathy. Skin: Warm and dry.  There is hyperpigmentation and scabs all over both arms.  Pigment changes to face. Cardiac: RRR, nl S1-S2, no murmurs appreciated on my exam.  Capillary refill is brisk.  2+ pitting LE edema to ankles.   Respiratory: Normal respiratory rate and rhythm.  CTAB without rales or wheezes. GU: The scrotum is very large and uniformly swollen and tender.  There appears also in fact to be a purulent abscess  on the penile shaft itself.   Abdomen: Abdomen soft without rigidity.  No significant TTP. No ascites, distension.   Neuro: Memory seems impaired, but oriented to time and situation.  Responding to questions, attention seems normal.  Speech is fluent.  Moves all extremities equally and with normal coordination and strength.  CN normal.    Psych: Behavior appropriate.  Affect pleasant.  No evidence of aural or visual hallucinations or delusions.       Labs on Admission:  The metabolic panel shows hyponatremia, AKI, hypoalbuminemia. The complete blood count shows anemia, thrombocytopenia. BNP elevated, mildly. Coags normal. TNI normal. Lactate normal. UA shows mild pyuria and hematuria. Transaminases and bilirubin normal.   Radiological Exams on Admission: Personally reviewed: Dg Chest 2 View  08/15/2015  CLINICAL  DATA:  Shortness of breath. EXAM: CHEST  2 VIEW COMPARISON:  August 03, 2015. FINDINGS: The heart size and mediastinal contours are within normal limits. Both lungs are clear. No pneumothorax or pleural effusion is noted. The visualized skeletal structures are unremarkable. IMPRESSION: No active cardiopulmonary disease. Electronically Signed   By: Marijo Conception, M.D.   On: 08/15/2015 15:42   US Scrotum  08/15/2015  CLINICAL DATA:  Enlarged painful, inflamed scrotum EXAM: ULTRASOUND OF SCROTUM TECHNIQUE: Complete ultrasound examination of the testicles, epididymis, and other scrotal structures was performed. COMPARISON:  None available FINDINGS: Right testicle Measurements: 3.2 x 2.2 x 2.3 cm. No mass or microlithiasis visualized. Left testicle Measurements: 3.2 x 1.8 x 2.3 cm. No mass or microlithiasis visualized. Right epididymis: Enlarged, heterogeneous in appearance, no significant hypervascularity. Left epididymis: Small left epididymal incidental cyst measures 3 mm. Left epididymis also appears enlarged, heterogeneous and ill-defined. Hydrocele: Heterogeneous complex mixed  echogenicity hydroceles present bilaterally with diffuse scrotal edema and skin thickening, difficult to exclude pyocele / infection. Varicocele:  None visualized. IMPRESSION: Diffuse scrotal skin thickening and edema with bilateral complex heterogeneous hydroceles and/or pyoceles. Difficult to exclude abscess. No testicular abnormality. Electronically Signed   By: Jerilynn Mages.  Shick M.D.   On: 08/15/2015 16:42   Ct Abdomen Pelvis W Contrast  08/15/2015  CLINICAL DATA:  Groin pain with walking and moving since 08/10/2015, swelling, dysuria, on Cipro for possible UTI, pain is burning and non radiating, no fever or chills, history prostate cancer, former smoker EXAM: CT ABDOMEN AND PELVIS WITH CONTRAST TECHNIQUE: Multidetector CT imaging of the abdomen and pelvis was performed using the standard protocol following bolus administration of intravenous contrast. Sagittal and coronal MPR images reconstructed from axial data set. CONTRAST:  15mL OMNIPAQUE IOHEXOL 300 MG/ML SOLN IV. Dilute oral contrast. COMPARISON:  CT abdomen and pelvis 02/09/2015 FINDINGS: Lung bases clear. Atrophic pancreas. Liver, gallbladder, spleen, pancreas, kidneys, and adrenal glands otherwise unremarkable. Extensive atherosclerotic calcification greatest at the iliac arteries. Coronary arterial calcifications as well. 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 Normal appendix. Unremarkable bladder and ureters. Prostate gland surgically absent. Question small BILATERAL inguinal hernias containing fat. Scattered edema within the subcutaneous soft tissues at the inferior pelvis particularly anteriorly. Significant soft tissue swelling/ edema and question hydroceles in scrotum. Stomach and bowel loops normal appearance. No mass, adenopathy, free air, or free fluid. No acute osseous lesions. IMPRESSION: Question small BILATERAL inguinal hernias containing fat. Significant soft tissue swelling of the scrotum with question  BILATERAL hydroceles. No acute intra-abdominal or intrapelvic abnormalities otherwise seen. Extensive atherosclerotic calcification as above. Electronically Signed   By: Lavonia Dana M.D.   On: 08/15/2015 17:25        Assessment/Plan 1. Scrotal cellulitis vs pyocele?:  This is new.  Previous history of prostatectomy for prostate CA in 2010.  Followed by Dr. Risa Grill.  Currently with new swelling redness, apparent cellulitis, question of pyoceles on [place].  So far patient has been able to urinate.   -I recommend medical admission -Vancomycin and piperacillin-tazobactam have been ordered for MRSA coverage (recent hospitalization and purulent cellulitis, failed cipro) and anaerobic coverage -Fluids administered in ED, does not meet sepsis criteria -MIVF -Consult to Urology, appreciate cares  2. Hyponatremia, acute on chronic:  This is new.  Suspect this is hypovolemic in setting of infection.  Fluids given in ED. -Trend BMP -Check urine and serum osmolality  3. AKI:  Baseline creatinine 1.2 (GFR 60).  Suspect this is  pre-renal -Fluids and trend BMP closely after contrast -Hold ACEi and further contrast  4. Protein calorie malnutrition:  -Consult to nutrition for nutrition assessment  5. Thrombocytopenia:  Suspect this is related to infection.   -Trend CBC, avoid heparins  6. Anemia, normocytic, renal disease related:  Acute on chronic.  Suspect worse now due to infection. -Trend CBC  7. Coronary disease:  -Continue home aspirin, statin, BB -Hold ACEi for now     DVT PPx: SCDs Diet: Heart healthy Consultants: Urology Code Status: FULL Family Communication: None present  Medical decision making: What exists of the patient's previous chart was reviewed in depth and the case was discussed with Will Dansie, PA-C. Patient seen 10:21 PM on 08/15/2015.  Disposition Plan:  I recommend admission to medical bed for inpatient status.  Clinical condition: stable.  Anticipate intravenous  antibiotisc and surgical consultation.  Disposition pending Urology evaluation. Trend renal function closely and watch fluids status given BNP and fluids overnight.  Trend electrolytes with fluids as well.      Edwin Dada [facility] Pager [phone]

## 2015-08-15 NOTE — ED Notes (Signed)
Phlebotomy tech and RN attempted to collect blood and cultures without success.

## 2015-08-15 NOTE — ED Notes (Signed)
I ATTEMPTED TO COLLECT LABS AND WAS UNSUCCESSFUL. 

## 2015-08-15 NOTE — ED Notes (Signed)
This tech went into room to try and attempt blood draw. Patient requested that we wait till main lab comes down. He would rather they try and not miss. Patient doesn't want to be stuck too many more times. Patient is ok if this tech tries after main labs attempt if unsuccessful.

## 2015-08-15 NOTE — ED Notes (Signed)
MD at bedside. 

## 2015-08-15 NOTE — ED Notes (Signed)
Patient given urinal and encouraged to void if able.

## 2015-08-15 NOTE — ED Notes (Addendum)
[place] machine used in attempt to collect blood sample without success.

## 2015-08-16 ENCOUNTER — Inpatient Hospital Stay (HOSPITAL_COMMUNITY): Payer: 59

## 2015-08-16 DIAGNOSIS — E871 Hypo-osmolality and hyponatremia: Secondary | ICD-10-CM

## 2015-08-16 DIAGNOSIS — D649 Anemia, unspecified: Secondary | ICD-10-CM

## 2015-08-16 DIAGNOSIS — N179 Acute kidney failure, unspecified: Secondary | ICD-10-CM

## 2015-08-16 DIAGNOSIS — N492 Inflammatory disorders of scrotum: Principal | ICD-10-CM

## 2015-08-16 DIAGNOSIS — D696 Thrombocytopenia, unspecified: Secondary | ICD-10-CM

## 2015-08-16 DIAGNOSIS — N5089 Other specified disorders of the male genital organs: Secondary | ICD-10-CM

## 2015-08-16 LAB — BASIC METABOLIC PANEL
Anion gap: 9 (ref 5–15)
BUN: 17 mg/dL (ref 6–20)
CO2: 23 mmol/L (ref 22–32)
CREATININE: 1.96 mg/dL — AB (ref 0.61–1.24)
Calcium: 8.2 mg/dL — ABNORMAL LOW (ref 8.9–10.3)
Chloride: 95 mmol/L — ABNORMAL LOW (ref 101–111)
GFR calc Af Amer: 40 mL/min — ABNORMAL LOW (ref >60)
GFR, EST NON AFRICAN AMERICAN: 35 mL/min — AB (ref >60)
Glucose, Bld: 109 mg/dL — ABNORMAL HIGH (ref 65–99)
POTASSIUM: 3.6 mmol/L (ref 3.5–5.1)
SODIUM: 127 mmol/L — AB (ref 135–145)

## 2015-08-16 LAB — CBC
HCT: 23.7 % — ABNORMAL LOW (ref 39.0–52.0)
Hemoglobin: 8.4 g/dL — ABNORMAL LOW (ref 13.0–17.0)
MCH: 32.9 pg (ref 26.0–34.0)
MCHC: 35.4 g/dL (ref 30.0–36.0)
MCV: 92.9 fL (ref 78.0–100.0)
PLATELETS: 74 10*3/uL — AB (ref 150–400)
RBC: 2.55 MIL/uL — ABNORMAL LOW (ref 4.22–5.81)
RDW: 14.2 % (ref 11.5–15.5)
WBC: 7.3 10*3/uL (ref 4.0–10.5)

## 2015-08-16 LAB — OSMOLALITY, URINE: Osmolality, Ur: 346 mOsm/kg (ref 300–900)

## 2015-08-16 LAB — OSMOLALITY: OSMOLALITY: 258 mosm/kg — AB (ref 275–295)

## 2015-08-16 MED ORDER — LORAZEPAM 2 MG/ML IJ SOLN: 1.0000 mg | Freq: Once | INTRAMUSCULAR | Status: DC

## 2015-08-16 MED ORDER — VITAMIN B-1 100 MG PO TABS
100.0000 mg | ORAL_TABLET | Freq: Every day | ORAL | Status: DC
  Administered 2015-08-16 – 2015-08-22 (×7): 100 mg via ORAL
  Filled 2015-08-16 (×7): qty 1

## 2015-08-16 MED ORDER — LORAZEPAM 1 MG PO TABS: 1.0000 mg | ORAL_TABLET | Freq: Four times a day (QID) | ORAL | Status: AC | PRN

## 2015-08-16 MED ORDER — ADULT MULTIVITAMIN W/MINERALS CH
1.0000 | ORAL_TABLET | Freq: Every day | ORAL | Status: DC
  Administered 2015-08-16 – 2015-08-22 (×7): 1 via ORAL
  Filled 2015-08-16 (×7): qty 1

## 2015-08-16 MED ORDER — THIAMINE HCL 100 MG/ML IJ SOLN: 100.0000 mg | Freq: Every day | INTRAMUSCULAR | Status: DC

## 2015-08-16 MED ORDER — FOLIC ACID 1 MG PO TABS
1.0000 mg | ORAL_TABLET | Freq: Every day | ORAL | Status: DC
  Administered 2015-08-16 – 2015-08-22 (×7): 1 mg via ORAL
  Filled 2015-08-16 (×7): qty 1

## 2015-08-16 MED ORDER — DEXTROSE 5 % IV SOLN
2.0000 g | INTRAVENOUS | Status: DC
  Administered 2015-08-16: 2 g via INTRAVENOUS
  Filled 2015-08-16 (×2): qty 2

## 2015-08-16 MED ORDER — PIPERACILLIN-TAZOBACTAM 3.375 G IVPB
3.3750 g | Freq: Three times a day (TID) | INTRAVENOUS | Status: DC
  Administered 2015-08-16 (×2): 3.375 g via INTRAVENOUS
  Filled 2015-08-16 (×2): qty 50

## 2015-08-16 MED ORDER — LORAZEPAM 2 MG/ML IJ SOLN: 1.0000 mg | Freq: Four times a day (QID) | INTRAMUSCULAR | Status: AC | PRN

## 2015-08-16 MED ORDER — VANCOMYCIN HCL IN DEXTROSE 750-5 MG/150ML-% IV SOLN
750.0000 mg | INTRAVENOUS | Status: DC
  Administered 2015-08-16: 750 mg via INTRAVENOUS
  Filled 2015-08-16 (×2): qty 150

## 2015-08-16 NOTE — Consult Note (Addendum)
WOC wound consult note Reason for Consult: Consult requested for penis and scrotum.  Urology performed consult earlier and they requested [place] assistance for topical treatment of wounds.  Wound type: Scrotum with generalized edema and erythremia; patchy areas of partial thickness skin loss with pink moist skin. Measurement: Shaft of penis with 2X2cm area of slightly raised, white skin; unknown etiology of this lesion according to patient.  No odor or drainage, fluctuant when probed with swab, painful to touch. Dressing procedure/placement/frequency: Xeroform gauze to promote drying and healing of affected areas. Elevate scrotum to reduce swelling. Topical treatment may be minimally effective to promote healing.  If no improvement in appearance of wounds within the next several days, then recommend re-consult to urology. Discussed plan of care with patient and primary team. Please re-consult if further assistance is needed.  Thank-you,  Julien Girt MSN, [place], [place], [place], [place]

## 2015-08-16 NOTE — Progress Notes (Signed)
Initial Nutrition Assessment  DOCUMENTATION CODES:   Severe malnutrition in context of chronic illness  INTERVENTION:  -PT declined ONS   NUTRITION DIAGNOSIS:   Malnutrition related to chronic illness as evidenced by moderate depletion of body fat, moderate depletions of muscle mass.  GOAL:   Patient will meet greater than or equal to 90% of their needs   MONITOR:   PO intake, Labs, Weight trends, I & O's  REASON FOR ASSESSMENT:   Malnutrition Screening Tool    ASSESSMENT:   Rodney [name] is a 65 y.o. male with a past medical history significant for CAD, prostate Ca s/p prostatectomy in 2010, and CKD who presents with groin pain.  Rodney [name] is a pleasant 65 yo man who endorses poor appetite after losing his wife 15 months ago. He stated he got over her loss after 7 months but that he "doesn't know how to cook, and isn't going to learn." He mostly eats out at fast food restaurants, occasionally cooks bacon and eggs, or sausage, and also eats frozen dinners. Normally eats two small meals a day. Endorses weight loss of about 2#.  He has boost at home but doesn't like it, declined ONS inpatient.  Nutrition-Focused physical exam completed. Findings are moderate fat depletion, moderate muscle depletion, and no edema.   Labs: Na 127,Cr 1.96 Medications reviewed.   Diet Order:  Diet Heart Room service appropriate?: Yes; Fluid consistency:: Thin  Skin:  Wound (see comment) (Multiple wounds to leg, penis.)  Last BM:  3/14  Height:   Ht Readings from Last 1 Encounters:  08/15/15 5\' 8"  (1.727 m)    Weight:   Wt Readings from Last 1 Encounters:  08/15/15 134 lb (60.782 kg)    Ideal Body Weight:  70 kg  BMI:  Body mass index is 20.38 kg/(m^2).  Estimated Nutritional Needs:   Kcal:  1550-1850  Protein:  60-75 grams  Fluid:  >/= 1.5L  EDUCATION NEEDS:   No education needs identified at this time  Rodney [name]. Rodney Dobosz, MS, RD LDN After Hours/Weekend Pager  [phone]

## 2015-08-16 NOTE — Progress Notes (Signed)
Pharmacy Antibiotic Note  Rodney [name] is a 65 y.o. male admitted on 08/15/2015 with cellulitis.  Pharmacy has been consulted for Zosyn/Vancomycin dosing.  Plan: Vancomycin 1Gm x1 then 750mg  IV q24h  Will aim for 15-20 mg/L b/c scrotal cellulitis Zosyn 3.375 Gm IV q8h EI  Height: 5\' 8"  (172.7 cm) IBW/kg (Calculated) : 68.4  Temp (24hrs), Avg:97.8 F (36.6 C), Min:97.4 F (36.3 C), Max:98 F (36.7 C)   Recent Labs Lab 08/15/15 1912 08/15/15 1927  WBC 8.7  --   CREATININE 1.86*  --   LATICACIDVEN  --  1.31    Estimated Creatinine Clearance: 33.4 mL/min (by C-G formula based on Cr of 1.86).    Allergies  Allergen Reactions  . Other Rash    polyestor and metals except titanium    Antimicrobials this admission: 3/14 zosyn >>  3/14 vancomycin >>   Dose adjustments this admission:   Microbiology results:  BCx:   UCx:    Sputum:    MRSA PCR:   Thank you for allowing pharmacy to be a part of this patient's care.  Dorrene German 08/16/2015 12:02 AM

## 2015-08-16 NOTE — Progress Notes (Signed)
Patient reports that he consumes 6-8 beers a day. Patient also reports that he is seeing different objects in his room. Reports that a small ugly little person is standing at the end of his bed, he also states that someone is staring in at him, patient yells out and tells them to go away.Also reports that this has happened a couple times at home with his fake " cigarette crawling along the couch. " Patient is alert/ oriented x 4 with mild memory issues. Mild tremors noted.

## 2015-08-16 NOTE — Progress Notes (Signed)
Pharmacy Antibiotic Note  Rodney [name] is a 65 y.o. male admitted on 08/15/2015 with scrotal cellulitis, penile wound.  Pharmacy has been consulted for Vancomycin dosing and Zosyn is now changed to Cefepime.  Plan:  Cefepime 2g IV q24h.  Continue Vancomycin 750 mg IV q24h.  Measure Vanc trough at steady state.  Follow up renal fxn, culture results, and clinical course.   Height: 5\' 8"  (172.7 cm) Weight: 134 lb (60.782 kg) IBW/kg (Calculated) : 68.4  Temp (24hrs), Avg:97.6 F (36.4 C), Min:97.4 F (36.3 C), Max:98 F (36.7 C)   Recent Labs Lab 08/15/15 1912 08/15/15 1927 08/16/15 0339  WBC 8.7  --  7.3  CREATININE 1.86*  --  1.96*  LATICACIDVEN  --  1.31  --     Estimated Creatinine Clearance: 33.2 mL/min (by C-G formula based on Cr of 1.96).    Allergies  Allergen Reactions  . Other Rash    polyestor and metals except titanium    Antimicrobials this admission: 3/14 zosyn >> 3/15 3/14 vancomycin >>  3/15 Cefepime >>  Dose adjustments this admission:  Microbiology results: 3/14 BCx: ngtd 3/14 UCx: pending   Thank you for allowing pharmacy to be a part of this patient's care.  Gretta Arab PharmD, BCPS Pager [phone] 08/16/2015 3:25 PM

## 2015-08-16 NOTE — Progress Notes (Signed)
Subjective: NAEON, continues to have pain, no elevation of scrotum on exam today, placed a towel under the scrotum. Slight improvement in exam.   Objective: Vital signs in last 24 hours: Temp:  [97.4 F (36.3 C)-98 F (36.7 C)] 97.5 F (36.4 C) (03/15 0600) Pulse Rate:  [64-90] 64 (03/15 0600) Resp:  [16-18] 16 (03/15 0600) BP: (102-134)/(64-91) 108/68 mmHg (03/15 0600) SpO2:  [91 %-100 %] 100 % (03/15 0600) Weight:  [60.782 kg (134 lb)] 60.782 kg (134 lb) (03/14 2358)  Intake/Output from previous day: 03/14 0701 - 03/15 0700 In: 720 [P.O.:720] Out: -  Intake/Output this shift:    Physical Exam:  General: Alert and oriented CV: RRR Lungs: Clear Abdomen: Soft, ND GU: uncircumcised phallus with penile wound of unclear origin without erythema or warmth. Diffusely swollen scrotum with excoriation consistent with reactive hydrocele. No evidence of abscess or fluctuant areas for possible drainage. Ext: NT, No erythema  Lab Results:  Recent Labs  08/15/15 1912 08/16/15 0339  HGB 9.1* 8.4*  HCT 26.0* 23.7*   BMET  Recent Labs  08/15/15 1912 08/16/15 0339  NA 123* 127*  K 4.0 3.6  CL 92* 95*  CO2 22 23  GLUCOSE 91 109*  BUN 15 17  CREATININE 1.86* 1.96*  CALCIUM 8.2* 8.2*     Studies/Results: Dg Chest 2 View  08/15/2015  CLINICAL DATA:  Shortness of breath. EXAM: CHEST  2 VIEW COMPARISON:  August 03, 2015. FINDINGS: The heart size and mediastinal contours are within normal limits. Both lungs are clear. No pneumothorax or pleural effusion is noted. The visualized skeletal structures are unremarkable. IMPRESSION: No active cardiopulmonary disease. Electronically Signed   By: Marijo Conception, M.D.   On: 08/15/2015 15:42   US Scrotum  08/15/2015  CLINICAL DATA:  Enlarged painful, inflamed scrotum EXAM: ULTRASOUND OF SCROTUM TECHNIQUE: Complete ultrasound examination of the testicles, epididymis, and other scrotal structures was performed. COMPARISON:  None available  FINDINGS: Right testicle Measurements: 3.2 x 2.2 x 2.3 cm. No mass or microlithiasis visualized. Left testicle Measurements: 3.2 x 1.8 x 2.3 cm. No mass or microlithiasis visualized. Right epididymis: Enlarged, heterogeneous in appearance, no significant hypervascularity. Left epididymis: Small left epididymal incidental cyst measures 3 mm. Left epididymis also appears enlarged, heterogeneous and ill-defined. Hydrocele: Heterogeneous complex mixed echogenicity hydroceles present bilaterally with diffuse scrotal edema and skin thickening, difficult to exclude pyocele / infection. Varicocele:  None visualized. IMPRESSION: Diffuse scrotal skin thickening and edema with bilateral complex heterogeneous hydroceles and/or pyoceles. Difficult to exclude abscess. No testicular abnormality. Electronically Signed   By: Jerilynn Mages.  Shick M.D.   On: 08/15/2015 16:42   Ct Abdomen Pelvis W Contrast  08/15/2015  CLINICAL DATA:  Groin pain with walking and moving since 08/10/2015, swelling, dysuria, on Cipro for possible UTI, pain is burning and non radiating, no fever or chills, history prostate cancer, former smoker EXAM: CT ABDOMEN AND PELVIS WITH CONTRAST TECHNIQUE: Multidetector CT imaging of the abdomen and pelvis was performed using the standard protocol following bolus administration of intravenous contrast. Sagittal and coronal MPR images reconstructed from axial data set. CONTRAST:  162mL OMNIPAQUE IOHEXOL 300 MG/ML SOLN IV. Dilute oral contrast. COMPARISON:  CT abdomen and pelvis 02/09/2015 FINDINGS: Lung bases clear. Atrophic pancreas. Liver, gallbladder, spleen, pancreas, kidneys, and adrenal glands otherwise unremarkable. Extensive atherosclerotic calcification greatest at the iliac arteries. Coronary arterial calcifications as well. 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 Normal appendix. Unremarkable bladder and ureters. Prostate gland surgically  absent. Question small BILATERAL  inguinal hernias containing fat. Scattered edema within the subcutaneous soft tissues at the inferior pelvis particularly anteriorly. Significant soft tissue swelling/ edema and question hydroceles in scrotum. Stomach and bowel loops normal appearance. No mass, adenopathy, free air, or free fluid. No acute osseous lesions. IMPRESSION: Question small BILATERAL inguinal hernias containing fat. Significant soft tissue swelling of the scrotum with question BILATERAL hydroceles. No acute intra-abdominal or intrapelvic abnormalities otherwise seen. Extensive atherosclerotic calcification as above. Electronically Signed   By: Lavonia Dana M.D.   On: 08/15/2015 17:25    Assessment/Plan: 65 y.o. male with bilateral scrotal swelling with excoriation, no evidence of abscess for drainage. Penile wound of unclear origin. The patient likely has reactive hydroceles with poor wound healing due to PVD, the Cipro he has been taking may be working but with poor wound care has not significantly improved.  - Elevate scrotum - Pain control, NSAIDs are classic for this condition, However with rising creatinine and vanc/zosyn combo would recommend against this. - Wound consult for penile lesion - Antibiotics for 14 days - Serial exams while in house   LOS: 1 day   Christell Faith 08/16/2015, 12:28 PM

## 2015-08-16 NOTE — Progress Notes (Signed)
PROGRESS NOTE  Rodney [name] [id number] DOB: 1951-04-20 DOA: 08/15/2015 PCP: Simona Huh, MD Brief History 65 y.o. male with a past medical history significant for CAD, prostate Ca s/p prostatectomy in 2010, and peripheral vascular disease  who presents with groin pain. Unfortunately, the patient is a poor historian. He states that approximately 5 days prior to admission, he went to see his primary care physician because of groin pain and dysuria. The patient was treated with ciprofloxacin for presumptive UTI. His symptoms did not improve and he began having groin pain and scrotal swelling. He presented to emergency department where he was found to have hyponatremia with sodium 123 with serum creatinine 1.86.  Assessment/Plan: Scrotal cellulitis -A component of the patient's wound appears to be vascular in origin with non-blanchable erythema -Continue vancomycin -Discontinue Zosyn -appreciate urology input -08/15/2015 CT abdomen and pelvis negative for acute intra-abdominal or pelvic abnormalities. Soft tissue edema particularly anteriorly in the pelvis. -08/15/2015 scrotal ultrasound--diffuse skin thickening and edema with bilateral hydroceles Hyponatremia -This appears to be chronic 128-132 -The patient may have had a degree of volume depletion that may contributed to his worsening -Improved with IV fluids -BMP in a.m. Peripheral vascular disease -Patient had aortogram with runoff on 02/10/2015 which revealed bilateral calcific plaques in the common femoral arteries causing approximately 80% stenosis of each common femoral artery AKI -renal ultrasound Thrombocytopenia -HIV -Serum B12 -Check fibrinogen -INR 0.90, PTT 28 -Suspect underlying cirrhosis Coronary artery disease -08/04/2015 heart catheterization--nonobstructive CAD -Continue Lipitor, metoprolol succinate Severe malnutrition -Patient declined nutritional supplements Alcohol dependence -Patient states  that he drinks 6-8 beers daily -Alcohol withdrawal protocol   Family Communication:   Niece update at beside 3/15 Disposition Plan:   Home when medically stable       Procedures/Studies: Dg Chest 2 View  08/15/2015  CLINICAL DATA:  Shortness of breath. EXAM: CHEST  2 VIEW COMPARISON:  August 03, 2015. FINDINGS: The heart size and mediastinal contours are within normal limits. Both lungs are clear. No pneumothorax or pleural effusion is noted. The visualized skeletal structures are unremarkable. IMPRESSION: No active cardiopulmonary disease. Electronically Signed   By: Marijo Conception, M.D.   On: 08/15/2015 15:42   Dg Chest 2 View  08/03/2015  CLINICAL DATA:  Chest pain starting today. Myocardial infarction in 2009. EXAM: CHEST  2 VIEW COMPARISON:  02/16/2015 FINDINGS: Emphysema. Mild atherosclerotic calcification of the aortic arch. Heart size within normal limits. Left lateral ninth and tenth rib irregularities, probably old fractures but not readily visible on 02/09/2015 CT, correlate with any point tenderness along the left lower chest. Biapical pleural parenchymal scarring.  No pleural effusion. IMPRESSION: 1. Left lateral ninth and tenth rib irregularities probably from old fractures, correlate with any point tenderness in this vicinity in assessing for more recent fracture. 2. Emphysema. 3. Atherosclerotic aortic arch. Electronically Signed   By: Van Clines M.D.   On: 08/03/2015 16:55   US Scrotum  08/15/2015  CLINICAL DATA:  Enlarged painful, inflamed scrotum EXAM: ULTRASOUND OF SCROTUM TECHNIQUE: Complete ultrasound examination of the testicles, epididymis, and other scrotal structures was performed. COMPARISON:  None available FINDINGS: Right testicle Measurements: 3.2 x 2.2 x 2.3 cm. No mass or microlithiasis visualized. Left testicle Measurements: 3.2 x 1.8 x 2.3 cm. No mass or microlithiasis visualized. Right epididymis: Enlarged, heterogeneous in appearance, no significant  hypervascularity. Left epididymis: Small left epididymal incidental cyst measures 3 mm. Left epididymis also appears enlarged, heterogeneous and  ill-defined. Hydrocele: Heterogeneous complex mixed echogenicity hydroceles present bilaterally with diffuse scrotal edema and skin thickening, difficult to exclude pyocele / infection. Varicocele:  None visualized. IMPRESSION: Diffuse scrotal skin thickening and edema with bilateral complex heterogeneous hydroceles and/or pyoceles. Difficult to exclude abscess. No testicular abnormality. Electronically Signed   By: Jerilynn Mages.  Shick M.D.   On: 08/15/2015 16:42   Ct Abdomen Pelvis W Contrast  08/15/2015  CLINICAL DATA:  Groin pain with walking and moving since 08/10/2015, swelling, dysuria, on Cipro for possible UTI, pain is burning and non radiating, no fever or chills, history prostate cancer, former smoker EXAM: CT ABDOMEN AND PELVIS WITH CONTRAST TECHNIQUE: Multidetector CT imaging of the abdomen and pelvis was performed using the standard protocol following bolus administration of intravenous contrast. Sagittal and coronal MPR images reconstructed from axial data set. CONTRAST:  196mL OMNIPAQUE IOHEXOL 300 MG/ML SOLN IV. Dilute oral contrast. COMPARISON:  CT abdomen and pelvis 02/09/2015 FINDINGS: Lung bases clear. Atrophic pancreas. Liver, gallbladder, spleen, pancreas, kidneys, and adrenal glands otherwise unremarkable. Extensive atherosclerotic calcification greatest at the iliac arteries. Coronary arterial calcifications as well. 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 Normal appendix. Unremarkable bladder and ureters. Prostate gland surgically absent. Question small BILATERAL inguinal hernias containing fat. Scattered edema within the subcutaneous soft tissues at the inferior pelvis particularly anteriorly. Significant soft tissue swelling/ edema and question hydroceles in scrotum. Stomach and bowel loops normal appearance. No mass,  adenopathy, free air, or free fluid. No acute osseous lesions. IMPRESSION: Question small BILATERAL inguinal hernias containing fat. Significant soft tissue swelling of the scrotum with question BILATERAL hydroceles. No acute intra-abdominal or intrapelvic abnormalities otherwise seen. Extensive atherosclerotic calcification as above. Electronically Signed   By: Lavonia Dana M.D.   On: 08/15/2015 17:25        Subjective: Patient complains of pain in the suprapubic and scrotal area without any purulent penile drainage. Denies any fevers, chills, chest pain shortness breath, nausea, vomiting, diarrhea, abdominal pain. No dysuria or hematuria.  Objective: Filed Vitals:   08/15/15 2243 08/15/15 2300 08/15/15 2358 08/16/15 0600  BP:  114/91 102/64 108/68  Pulse: 90 85 83 64  Temp:   97.4 F (36.3 C) 97.5 F (36.4 C)  TempSrc:   Oral Oral  Resp:  16 16 16   Height:   5\' 8"  (1.727 m)   Weight:   60.782 kg (134 lb)   SpO2: 100% 100% 91% 100%    Intake/Output Summary (Last 24 hours) at 08/16/15 1454 Last data filed at 08/16/15 0601  Gross per 24 hour  Intake    720 ml  Output      0 ml  Net    720 ml   Weight change:  Exam:   General:  Pt is alert, follows commands appropriately, not in acute distress  HEENT: No icterus, No thrush, No neck mass, [place]/AT  Cardiovascular: RRR, S1/S2, no rubs, no gallops  Respiratory: CTA bilaterally, no wheezing, no crackles, no rhonchi  Abdomen: Soft/+BS, non tender, non distended, no guarding  Extremities: No edema, No lymphangitis, No petechiae, No rashes, no synovitis; bilateral scrotal edema with central area of non-blanchable erythema. Base of the penile shaft with devitalized tissue without pus or necrosis.  Data Reviewed: Basic Metabolic Panel:  Recent Labs Lab 08/15/15 1912 08/16/15 0339  NA 123* 127*  K 4.0 3.6  CL 92* 95*  CO2 22 23  GLUCOSE 91 109*  BUN 15 17  CREATININE 1.86* 1.96*  CALCIUM  8.2* 8.2*   Liver Function  Tests:  Recent Labs Lab 08/15/15 1912  AST 26  ALT 35  ALKPHOS 82  BILITOT 0.8  PROT 4.8*  ALBUMIN 2.1*   No results for input(s): LIPASE, AMYLASE in the last 168 hours. No results for input(s): AMMONIA in the last 168 hours. CBC:  Recent Labs Lab 08/15/15 1912 08/16/15 0339  WBC 8.7 7.3  NEUTROABS 7.6  --   HGB 9.1* 8.4*  HCT 26.0* 23.7*  MCV 91.9 92.9  PLT 80* 74*   Cardiac Enzymes: No results for input(s): CKTOTAL, CKMB, CKMBINDEX, TROPONINI in the last 168 hours. BNP: Invalid input(s): POCBNP CBG: No results for input(s): GLUCAP in the last 168 hours.  Recent Results (from the past 240 hour(s))  Culture, blood (Routine X 2) w Reflex to ID Panel     Status: None (Preliminary result)   Collection Time: 08/15/15  7:02 PM  Result Value Ref Range Status   Specimen Description BLOOD LEFT ARM  Final   Special Requests BOTTLES DRAWN AEROBIC AND ANAEROBIC  5CC  Final   Culture   Final    NO GROWTH < 24 HOURS Performed at [facility]    Report Status PENDING  Incomplete  Culture, blood (Routine X 2) w Reflex to ID Panel     Status: None (Preliminary result)   Collection Time: 08/15/15  7:14 PM  Result Value Ref Range Status   Specimen Description BLOOD LEFT ARM  Final   Special Requests BOTTLES DRAWN AEROBIC AND ANAEROBIC  5CC  Final   Culture   Final    NO GROWTH < 24 HOURS Performed at [facility]    Report Status PENDING  Incomplete  Urine culture     Status: None (Preliminary result)   Collection Time: 08/15/15  7:46 PM  Result Value Ref Range Status   Specimen Description URINE, CLEAN CATCH  Final   Special Requests NONE  Final   Culture   Final    TOO YOUNG TO READ Performed at [facility]    Report Status PENDING  Incomplete     Scheduled Meds: . aspirin EC  81 mg Oral Daily  . atorvastatin  80 mg Oral q1800  . Difluprednate  1 drop Right Eye BID  . folic acid  1 mg Oral Daily  . latanoprost  1 drop Both Eyes QHS  .  LORazepam  1 mg Intravenous Once  . metoprolol succinate  25 mg Oral BID  . multivitamin with minerals  1 tablet Oral Daily  . pantoprazole  40 mg Oral Daily  . predniSONE  10 mg Oral TID  . thiamine  100 mg Oral Daily   Or  . thiamine  100 mg Intravenous Daily  . vancomycin  750 mg Intravenous Q24H   Continuous Infusions: . sodium chloride 125 mL/hr at 08/16/15 0835     Dale Ribeiro, DO  [facility] Pager [phone]  If 7PM-7AM, please contact night-coverage [email or web address] Password [email or web address] 08/16/2015, 2:54 PM   LOS: 1 day

## 2015-08-17 DIAGNOSIS — E46 Unspecified protein-calorie malnutrition: Secondary | ICD-10-CM

## 2015-08-17 LAB — HEPATITIS C ANTIBODY

## 2015-08-17 LAB — GLUCOSE, CAPILLARY: Glucose-Capillary: 90 mg/dL (ref 65–99)

## 2015-08-17 LAB — CBC
HEMATOCRIT: 24.1 % — AB (ref 39.0–52.0)
HEMOGLOBIN: 8.2 g/dL — AB (ref 13.0–17.0)
MCH: 32.9 pg (ref 26.0–34.0)
MCHC: 34 g/dL (ref 30.0–36.0)
MCV: 96.8 fL (ref 78.0–100.0)
Platelets: 89 10*3/uL — ABNORMAL LOW (ref 150–400)
RBC: 2.49 MIL/uL — ABNORMAL LOW (ref 4.22–5.81)
RDW: 14.4 % (ref 11.5–15.5)
WBC: 9 10*3/uL (ref 4.0–10.5)

## 2015-08-17 LAB — COMPREHENSIVE METABOLIC PANEL
ALK PHOS: 66 U/L (ref 38–126)
ALT: 27 U/L (ref 17–63)
ANION GAP: 7 (ref 5–15)
AST: 14 U/L — ABNORMAL LOW (ref 15–41)
Albumin: 1.9 g/dL — ABNORMAL LOW (ref 3.5–5.0)
BILIRUBIN TOTAL: 0.7 mg/dL (ref 0.3–1.2)
BUN: 18 mg/dL (ref 6–20)
CALCIUM: 7.9 mg/dL — AB (ref 8.9–10.3)
CO2: 21 mmol/L — ABNORMAL LOW (ref 22–32)
Chloride: 96 mmol/L — ABNORMAL LOW (ref 101–111)
Creatinine, Ser: 2.83 mg/dL — ABNORMAL HIGH (ref 0.61–1.24)
GFR calc Af Amer: 26 mL/min — ABNORMAL LOW (ref >60)
GFR, EST NON AFRICAN AMERICAN: 22 mL/min — AB (ref >60)
Glucose, Bld: 109 mg/dL — ABNORMAL HIGH (ref 65–99)
POTASSIUM: 4 mmol/L (ref 3.5–5.1)
Sodium: 124 mmol/L — ABNORMAL LOW (ref 135–145)
TOTAL PROTEIN: 4.4 g/dL — AB (ref 6.5–8.1)

## 2015-08-17 LAB — URINE CULTURE

## 2015-08-17 LAB — VANCOMYCIN, TROUGH: VANCOMYCIN TR: 16 ug/mL (ref 10.0–20.0)

## 2015-08-17 LAB — HIV ANTIBODY (ROUTINE TESTING W REFLEX): HIV SCREEN 4TH GENERATION: NONREACTIVE

## 2015-08-17 MED ORDER — PREDNISOLONE ACETATE 1 % OP SUSP
1.0000 [drp] | Freq: Two times a day (BID) | OPHTHALMIC | Status: DC
  Administered 2015-08-17 – 2015-08-22 (×11): 1 [drp] via OPHTHALMIC
  Filled 2015-08-17: qty 5

## 2015-08-17 MED ORDER — CEFEPIME HCL 1 G IJ SOLR
1.0000 g | INTRAMUSCULAR | Status: DC
  Administered 2015-08-17: 1 g via INTRAVENOUS
  Filled 2015-08-17 (×2): qty 1

## 2015-08-17 MED ORDER — VANCOMYCIN HCL 500 MG IV SOLR
500.0000 mg | INTRAVENOUS | Status: DC
  Administered 2015-08-17: 500 mg via INTRAVENOUS
  Filled 2015-08-17 (×2): qty 500

## 2015-08-17 MED ORDER — POLYVINYL ALCOHOL 1.4 % OP SOLN
1.0000 [drp] | OPHTHALMIC | Status: DC | PRN
  Administered 2015-08-17: 1 [drp] via OPHTHALMIC
  Filled 2015-08-17: qty 15

## 2015-08-17 NOTE — Progress Notes (Signed)
Pharmacy Consult Note - Vancomycin Follow Up  Labs: vanc trough 16  A/P: vanc trough supratherapeutic (goal 10-15) but vanc dosing is not at steady state due to continued changing of vanc dosing with changing renal function. Will restart vanc at dose 500mg  IV q24 for time being and follow up renal function closely  Adrian Saran, PharmD, BCPS Pager [phone] 08/17/2015 4:21 PM

## 2015-08-17 NOTE — Progress Notes (Signed)
Urology Inpatient Progress Report Cellulitis of scrotum [N49.2] Hyponatremia [E87.1] Scrotal pain [N50.82] Scrotal swelling [N50.89] AKI (acute kidney injury) ([place]) [N17.9]    Intv/Subj: No acute events overnight. Patient is without complaint. Patient related that "He got wasted last night!  We had one hell of a good party!"  Past Medical History  Diagnosis Date  . Prostate cancer ([place]) 05/03/2009    Prostatectomy/Adenocrcinoma  . Heart murmur     history  . Cataract     left eye surgery/   . Radiation 07/02/11-08/22/11    Prostate fossa 68.4 gray 38 fractions   Current Facility-Administered Medications  Medication Dose Route Frequency Provider Last Rate Last Dose  . 0.9 %  sodium chloride infusion   Intravenous Continuous Orson Eva, MD 75 mL/hr at 08/17/15 0215 1,000 mL at 08/17/15 0215  . acetaminophen (TYLENOL) tablet 650 mg  650 mg Oral Q6H PRN Edwin Dada, MD       Or  . acetaminophen (TYLENOL) suppository 650 mg  650 mg Rectal Q6H PRN Edwin Dada, MD      . aspirin EC tablet 81 mg  81 mg Oral Daily Edwin Dada, MD   81 mg at 08/17/15 1035  . atorvastatin (LIPITOR) tablet 80 mg  80 mg Oral q1800 Edwin Dada, MD   80 mg at 08/16/15 1704  . ceFEPIme (MAXIPIME) 1 g in dextrose 5 % 50 mL IVPB  1 g Intravenous Q24H Anh P Pham, RPH      . folic acid (FOLVITE) tablet 1 mg  1 mg Oral Daily Rhetta Mura Schorr, NP   1 mg at 08/17/15 1036  . HYDROcodone-acetaminophen (NORCO/VICODIN) 5-325 MG per tablet 1-2 tablet  1-2 tablet Oral Q4H PRN Edwin Dada, MD      . latanoprost (XALATAN) 0.005 % ophthalmic solution 1 drop  1 drop Both Eyes QHS Edwin Dada, MD   1 drop at 08/16/15 2358  . LORazepam (ATIVAN) injection 1 mg  1 mg Intravenous Once Jeryl Columbia, NP      . LORazepam (ATIVAN) tablet 1 mg  1 mg Oral Q6H PRN Jeryl Columbia, NP       Or  . LORazepam (ATIVAN) injection 1 mg  1 mg Intravenous Q6H PRN Rhetta Mura  Schorr, NP      . metoprolol succinate (TOPROL-XL) 24 hr tablet 25 mg  25 mg Oral BID Edwin Dada, MD   25 mg at 08/17/15 1035  . multivitamin with minerals tablet 1 tablet  1 tablet Oral Daily Rhetta Mura Schorr, NP   1 tablet at 08/17/15 1036  . pantoprazole (PROTONIX) EC tablet 40 mg  40 mg Oral Daily Edwin Dada, MD   40 mg at 08/17/15 1035  . prednisoLONE acetate (PRED FORTE) 1 % ophthalmic suspension 1 drop  1 drop Right Eye BID Minda Ditto, RPH      . predniSONE (DELTASONE) tablet 10 mg  10 mg Oral TID Edwin Dada, MD   10 mg at 08/17/15 1035  . thiamine (VITAMIN B-1) tablet 100 mg  100 mg Oral Daily Rhetta Mura Schorr, NP   100 mg at 08/17/15 1035   Or  . thiamine (B-1) injection 100 mg  100 mg Intravenous Daily Jeryl Columbia, NP         Objective: Vital: Filed Vitals:   08/16/15 1430 08/16/15 2110 08/16/15 2356 08/17/15 0641  BP: 116/64 126/69 140/60 124/74  Pulse: 110 50 88 69  Temp: 97.7 F (36.5 C) 98 F (36.7 C)  97.1 F (36.2 C)  TempSrc: Oral Oral  Oral  Resp: 18 16  16   Height:      Weight:      SpO2: 100% 100%  100%   I/Os: I/O last 3 completed shifts: In: 1749 [P.O.:1080; I.V.:669] Out: -   Physical Exam:  General: Patient is in no apparent distress - completely confused Scrotal swelling is perhaps slightly worse today, but was not elevated, Ischemic area on the dependent aspect is unchanged as is the penile lesion.  Lab Results:  Recent Labs  08/15/15 1912 08/16/15 0339 08/17/15 0401  WBC 8.7 7.3 9.0  HGB 9.1* 8.4* 8.2*  HCT 26.0* 23.7* 24.1*    Recent Labs  08/15/15 1912 08/16/15 0339 08/17/15 0401  NA 123* 127* 124*  K 4.0 3.6 4.0  CL 92* 95* 96*  CO2 22 23 21*  GLUCOSE 91 109* 109*  BUN 15 17 18   CREATININE 1.86* 1.96* 2.83*  CALCIUM 8.2* 8.2* 7.9*    Recent Labs  08/15/15 1912  INR 0.90   No results for input(s): LABURIN in the last 72 hours. Results for orders placed or performed during  the hospital encounter of 08/15/15  Culture, blood (Routine X 2) w Reflex to ID Panel     Status: None (Preliminary result)   Collection Time: 08/15/15  7:02 PM  Result Value Ref Range Status   Specimen Description BLOOD LEFT ARM  Final   Special Requests BOTTLES DRAWN AEROBIC AND ANAEROBIC  5CC  Final   Culture   Final    NO GROWTH 2 DAYS Performed at [facility]    Report Status PENDING  Incomplete  Culture, blood (Routine X 2) w Reflex to ID Panel     Status: None (Preliminary result)   Collection Time: 08/15/15  7:14 PM  Result Value Ref Range Status   Specimen Description BLOOD LEFT ARM  Final   Special Requests BOTTLES DRAWN AEROBIC AND ANAEROBIC  5CC  Final   Culture   Final    NO GROWTH 2 DAYS Performed at [facility]    Report Status PENDING  Incomplete  Urine culture     Status: None   Collection Time: 08/15/15  7:46 PM  Result Value Ref Range Status   Specimen Description URINE, CLEAN CATCH  Final   Special Requests NONE  Final   Culture   Final    MULTIPLE SPECIES PRESENT, SUGGEST RECOLLECTION Performed at [facility]    Report Status 08/17/2015 FINAL  Final    Studies/Results: Dg Chest 2 View  08/15/2015  CLINICAL DATA:  Shortness of breath. EXAM: CHEST  2 VIEW COMPARISON:  August 03, 2015. FINDINGS: The heart size and mediastinal contours are within normal limits. Both lungs are clear. No pneumothorax or pleural effusion is noted. The visualized skeletal structures are unremarkable. IMPRESSION: No active cardiopulmonary disease. Electronically Signed   By: Marijo Conception, M.D.   On: 08/15/2015 15:42   US Abdomen Complete  08/16/2015  CLINICAL DATA:  Generalized abdominal pain EXAM: ABDOMEN ULTRASOUND COMPLETE COMPARISON:  None. FINDINGS: Gallbladder: No gallstones or wall thickening visualized. No sonographic Murphy sign noted by sonographer. Common bile duct: Diameter: 3.4 mm Liver: No focal lesion identified. Within normal limits in  parenchymal echogenicity. IVC: No abnormality visualized. Pancreas: Visualized portion unremarkable. Spleen: Size and appearance within normal limits. Right Kidney: Length: 10.2 cm. Echogenicity within normal limits. No mass or hydronephrosis visualized.  Left Kidney: Length: 9.6 cm. Echogenicity within normal limits. No mass or hydronephrosis visualized. Abdominal aorta: No aneurysm visualized. Other findings: Small right pleural effusion. IMPRESSION: 1. No cholelithiasis or sonographic evidence of acute cholecystitis. 2. No obstructive uropathy. Electronically Signed   By: Kathreen Devoid   On: 08/16/2015 16:25   US Scrotum  08/15/2015  CLINICAL DATA:  Enlarged painful, inflamed scrotum EXAM: ULTRASOUND OF SCROTUM TECHNIQUE: Complete ultrasound examination of the testicles, epididymis, and other scrotal structures was performed. COMPARISON:  None available FINDINGS: Right testicle Measurements: 3.2 x 2.2 x 2.3 cm. No mass or microlithiasis visualized. Left testicle Measurements: 3.2 x 1.8 x 2.3 cm. No mass or microlithiasis visualized. Right epididymis: Enlarged, heterogeneous in appearance, no significant hypervascularity. Left epididymis: Small left epididymal incidental cyst measures 3 mm. Left epididymis also appears enlarged, heterogeneous and ill-defined. Hydrocele: Heterogeneous complex mixed echogenicity hydroceles present bilaterally with diffuse scrotal edema and skin thickening, difficult to exclude pyocele / infection. Varicocele:  None visualized. IMPRESSION: Diffuse scrotal skin thickening and edema with bilateral complex heterogeneous hydroceles and/or pyoceles. Difficult to exclude abscess. No testicular abnormality. Electronically Signed   By: Jerilynn Mages.  Shick M.D.   On: 08/15/2015 16:42   Ct Abdomen Pelvis W Contrast  08/15/2015  CLINICAL DATA:  Groin pain with walking and moving since 08/10/2015, swelling, dysuria, on Cipro for possible UTI, pain is burning and non radiating, no fever or chills,  history prostate cancer, former smoker EXAM: CT ABDOMEN AND PELVIS WITH CONTRAST TECHNIQUE: Multidetector CT imaging of the abdomen and pelvis was performed using the standard protocol following bolus administration of intravenous contrast. Sagittal and coronal MPR images reconstructed from axial data set. CONTRAST:  178mL OMNIPAQUE IOHEXOL 300 MG/ML SOLN IV. Dilute oral contrast. COMPARISON:  CT abdomen and pelvis 02/09/2015 FINDINGS: Lung bases clear. Atrophic pancreas. Liver, gallbladder, spleen, pancreas, kidneys, and adrenal glands otherwise unremarkable. Extensive atherosclerotic calcification greatest at the iliac arteries. Coronary arterial calcifications as well. 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 Normal appendix. Unremarkable bladder and ureters. Prostate gland surgically absent. Question small BILATERAL inguinal hernias containing fat. Scattered edema within the subcutaneous soft tissues at the inferior pelvis particularly anteriorly. Significant soft tissue swelling/ edema and question hydroceles in scrotum. Stomach and bowel loops normal appearance. No mass, adenopathy, free air, or free fluid. No acute osseous lesions. IMPRESSION: Question small BILATERAL inguinal hernias containing fat. Significant soft tissue swelling of the scrotum with question BILATERAL hydroceles. No acute intra-abdominal or intrapelvic abnormalities otherwise seen. Extensive atherosclerotic calcification as above. Electronically Signed   By: Lavonia Dana M.D.   On: 08/15/2015 17:25    Assessment:   Scrotal cellulitis is stable/mildly improved - swelling persist Plan: Continue abx, scrotum should be elevated at all times when patient is not walking.  Will schedule patient to f/u with Dr. Risa Grill, his primary urologist (;prostate cancer) in 3-4 weeks to ensure that his cellulitis resolves.   LOS: 2 days  Ardis Hughs 08/17/2015, 12:10 PM

## 2015-08-17 NOTE — Progress Notes (Signed)
PROGRESS NOTE  Rodney [name] [id number] DOB: Jan 02, 1951 DOA: 08/15/2015 PCP: Simona Huh, MD  Brief History 65 y.o. male with a past medical history significant for CAD, prostate Ca s/p prostatectomy in 2010, and peripheral vascular disease who presents with groin pain. Unfortunately, the patient is a poor historian. He states that approximately 5 days prior to admission, he went to see his primary care physician because of groin pain and dysuria. The patient was treated with ciprofloxacin for presumptive UTI. His symptoms did not improve and he began having groin pain and scrotal swelling. He presented to emergency department where he was found to have hyponatremia with sodium 123 with serum creatinine 1.86.  Assessment/Plan: Scrotal cellulitis -A component of the patient's wound appears to be vascular in origin with non-blanchable erythema -Continue vancomycin -Discontinue Zosyn and cefepime -appreciate urology input -08/15/2015 CT abdomen and pelvis negative for acute intra-abdominal or pelvic abnormalities. Soft tissue edema particularly anteriorly in the pelvis. -08/15/2015 scrotal ultrasound--diffuse skin thickening and edema with bilateral hydroceles -appreciate wound care nurse recommendations Hyponatremia -This appears to be chronic 128-132 -worsening likely due to volume depletion (initially) poor solute intake and impaired Na reabsorption from renal failure -Improved with IV fluids-->saline lock now as pt no longer appears volume depleted -BMP in a.m. Peripheral vascular disease -Patient had aortogram with runoff on 02/10/2015 which revealed bilateral calcific plaques in the common femoral arteries causing approximately 80% stenosis of each common femoral artery AKI -renal ultrasound-no hydronephrosis -while initially may have been due to volume depletion, not contrast nephropathy may be primary issue Thrombocytopenia -HIV--neg -hep c antibody neg -Serum  B12 -Check fibrinogen -INR 0.90, PTT 28 Coronary artery disease -08/04/2015 heart catheterization--nonobstructive CAD -Continue Lipitor, metoprolol succinate Severe malnutrition -Patient declined nutritional supplements Alcohol dependence -Patient states that he drinks 6-8 beers daily -Alcohol withdrawal protocol   Family Communication: Niece update at beside 3/15 Disposition Plan: Home when medically stable    Procedures/Studies: Dg Chest 2 View  08/15/2015  CLINICAL DATA:  Shortness of breath. EXAM: CHEST  2 VIEW COMPARISON:  August 03, 2015. FINDINGS: The heart size and mediastinal contours are within normal limits. Both lungs are clear. No pneumothorax or pleural effusion is noted. The visualized skeletal structures are unremarkable. IMPRESSION: No active cardiopulmonary disease. Electronically Signed   By: Marijo Conception, M.D.   On: 08/15/2015 15:42   Dg Chest 2 View  08/03/2015  CLINICAL DATA:  Chest pain starting today. Myocardial infarction in 2009. EXAM: CHEST  2 VIEW COMPARISON:  02/16/2015 FINDINGS: Emphysema. Mild atherosclerotic calcification of the aortic arch. Heart size within normal limits. Left lateral ninth and tenth rib irregularities, probably old fractures but not readily visible on 02/09/2015 CT, correlate with any point tenderness along the left lower chest. Biapical pleural parenchymal scarring.  No pleural effusion. IMPRESSION: 1. Left lateral ninth and tenth rib irregularities probably from old fractures, correlate with any point tenderness in this vicinity in assessing for more recent fracture. 2. Emphysema. 3. Atherosclerotic aortic arch. Electronically Signed   By: Van Clines M.D.   On: 08/03/2015 16:55   US Abdomen Complete  08/16/2015  CLINICAL DATA:  Generalized abdominal pain EXAM: ABDOMEN ULTRASOUND COMPLETE COMPARISON:  None. FINDINGS: Gallbladder: No gallstones or wall thickening visualized. No sonographic Murphy sign noted by sonographer.  Common bile duct: Diameter: 3.4 mm Liver: No focal lesion identified. Within normal limits in parenchymal echogenicity. IVC: No abnormality visualized. Pancreas: Visualized portion unremarkable. Spleen: Size  and appearance within normal limits. Right Kidney: Length: 10.2 cm. Echogenicity within normal limits. No mass or hydronephrosis visualized. Left Kidney: Length: 9.6 cm. Echogenicity within normal limits. No mass or hydronephrosis visualized. Abdominal aorta: No aneurysm visualized. Other findings: Small right pleural effusion. IMPRESSION: 1. No cholelithiasis or sonographic evidence of acute cholecystitis. 2. No obstructive uropathy. Electronically Signed   By: Kathreen Devoid   On: 08/16/2015 16:25   US Scrotum  08/15/2015  CLINICAL DATA:  Enlarged painful, inflamed scrotum EXAM: ULTRASOUND OF SCROTUM TECHNIQUE: Complete ultrasound examination of the testicles, epididymis, and other scrotal structures was performed. COMPARISON:  None available FINDINGS: Right testicle Measurements: 3.2 x 2.2 x 2.3 cm. No mass or microlithiasis visualized. Left testicle Measurements: 3.2 x 1.8 x 2.3 cm. No mass or microlithiasis visualized. Right epididymis: Enlarged, heterogeneous in appearance, no significant hypervascularity. Left epididymis: Small left epididymal incidental cyst measures 3 mm. Left epididymis also appears enlarged, heterogeneous and ill-defined. Hydrocele: Heterogeneous complex mixed echogenicity hydroceles present bilaterally with diffuse scrotal edema and skin thickening, difficult to exclude pyocele / infection. Varicocele:  None visualized. IMPRESSION: Diffuse scrotal skin thickening and edema with bilateral complex heterogeneous hydroceles and/or pyoceles. Difficult to exclude abscess. No testicular abnormality. Electronically Signed   By: Jerilynn Mages.  Shick M.D.   On: 08/15/2015 16:42   Ct Abdomen Pelvis W Contrast  08/15/2015  CLINICAL DATA:  Groin pain with walking and moving since 08/10/2015, swelling,  dysuria, on Cipro for possible UTI, pain is burning and non radiating, no fever or chills, history prostate cancer, former smoker EXAM: CT ABDOMEN AND PELVIS WITH CONTRAST TECHNIQUE: Multidetector CT imaging of the abdomen and pelvis was performed using the standard protocol following bolus administration of intravenous contrast. Sagittal and coronal MPR images reconstructed from axial data set. CONTRAST:  169mL OMNIPAQUE IOHEXOL 300 MG/ML SOLN IV. Dilute oral contrast. COMPARISON:  CT abdomen and pelvis 02/09/2015 FINDINGS: Lung bases clear. Atrophic pancreas. Liver, gallbladder, spleen, pancreas, kidneys, and adrenal glands otherwise unremarkable. Extensive atherosclerotic calcification greatest at the iliac arteries. Coronary arterial calcifications as well. 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 Normal appendix. Unremarkable bladder and ureters. Prostate gland surgically absent. Question small BILATERAL inguinal hernias containing fat. Scattered edema within the subcutaneous soft tissues at the inferior pelvis particularly anteriorly. Significant soft tissue swelling/ edema and question hydroceles in scrotum. Stomach and bowel loops normal appearance. No mass, adenopathy, free air, or free fluid. No acute osseous lesions. IMPRESSION: Question small BILATERAL inguinal hernias containing fat. Significant soft tissue swelling of the scrotum with question BILATERAL hydroceles. No acute intra-abdominal or intrapelvic abnormalities otherwise seen. Extensive atherosclerotic calcification as above. Electronically Signed   By: Lavonia Dana M.D.   On: 08/15/2015 17:25         Subjective: Overall, patient states that his suprapubic and scrotal pain or only minimally better. He is able to urinate without difficulty. Denies any fevers, chills, chest pain, shortness breath, nausea, vomiting, diarrhea. Denies any headache or neck pain.  Objective: Filed Vitals:   08/16/15 2110  08/16/15 2356 08/17/15 0641 08/17/15 1310  BP: 126/69 140/60 124/74 149/58  Pulse: 50 88 69 64  Temp: 98 F (36.7 C)  97.1 F (36.2 C) 97.6 F (36.4 C)  TempSrc: Oral  Oral Oral  Resp: 16  16 18   Height:      Weight:      SpO2: 100%  100% 99%    Intake/Output Summary (Last 24 hours) at 08/17/15 1854 Last data  filed at 08/17/15 0655  Gross per 24 hour  Intake   1029 ml  Output      0 ml  Net   1029 ml   Weight change:  Exam:   General:  Pt is alert, follows commands appropriately, not in acute distress  HEENT: No icterus, No thrush, No neck mass, [place]/AT  Cardiovascular: RRR, S1/S2, no rubs, no gallops  Respiratory: CTA bilaterally, no wheezing, no crackles, no rhonchi  Abdomen: Soft/+BS, non tender, non distended, no guarding;Suprapubic and inguinal edema without any lymphadenopathy. Minimal erythema. No crepitance. No draining wounds.  Extremities: No edema, No lymphangitis, No petechiae, No rashes, no synovitis  Data Reviewed: Basic Metabolic Panel:  Recent Labs Lab 08/15/15 1912 08/16/15 0339 08/17/15 0401  NA 123* 127* 124*  K 4.0 3.6 4.0  CL 92* 95* 96*  CO2 22 23 21*  GLUCOSE 91 109* 109*  BUN 15 17 18   CREATININE 1.86* 1.96* 2.83*  CALCIUM 8.2* 8.2* 7.9*   Liver Function Tests:  Recent Labs Lab 08/15/15 1912 08/17/15 0401  AST 26 14*  ALT 35 27  ALKPHOS 82 66  BILITOT 0.8 0.7  PROT 4.8* 4.4*  ALBUMIN 2.1* 1.9*   No results for input(s): LIPASE, AMYLASE in the last 168 hours. No results for input(s): AMMONIA in the last 168 hours. CBC:  Recent Labs Lab 08/15/15 1912 08/16/15 0339 08/17/15 0401  WBC 8.7 7.3 9.0  NEUTROABS 7.6  --   --   HGB 9.1* 8.4* 8.2*  HCT 26.0* 23.7* 24.1*  MCV 91.9 92.9 96.8  PLT 80* 74* 89*   Cardiac Enzymes: No results for input(s): CKTOTAL, CKMB, CKMBINDEX, TROPONINI in the last 168 hours. BNP: Invalid input(s): POCBNP CBG:  Recent Labs Lab 08/17/15 0809  GLUCAP 90    Recent Results (from the  past 240 hour(s))  Culture, blood (Routine X 2) w Reflex to ID Panel     Status: None (Preliminary result)   Collection Time: 08/15/15  7:02 PM  Result Value Ref Range Status   Specimen Description BLOOD LEFT ARM  Final   Special Requests BOTTLES DRAWN AEROBIC AND ANAEROBIC  5CC  Final   Culture   Final    NO GROWTH 2 DAYS Performed at [facility]    Report Status PENDING  Incomplete  Culture, blood (Routine X 2) w Reflex to ID Panel     Status: None (Preliminary result)   Collection Time: 08/15/15  7:14 PM  Result Value Ref Range Status   Specimen Description BLOOD LEFT ARM  Final   Special Requests BOTTLES DRAWN AEROBIC AND ANAEROBIC  5CC  Final   Culture   Final    NO GROWTH 2 DAYS Performed at [facility]    Report Status PENDING  Incomplete  Urine culture     Status: None   Collection Time: 08/15/15  7:46 PM  Result Value Ref Range Status   Specimen Description URINE, CLEAN CATCH  Final   Special Requests NONE  Final   Culture   Final    MULTIPLE SPECIES PRESENT, SUGGEST RECOLLECTION Performed at [facility]    Report Status 08/17/2015 FINAL  Final     Scheduled Meds: . aspirin EC  81 mg Oral Daily  . atorvastatin  80 mg Oral q1800  . ceFEPime (MAXIPIME) IV  1 g Intravenous Q24H  . folic acid  1 mg Oral Daily  . latanoprost  1 drop Both Eyes QHS  . LORazepam  1 mg  Intravenous Once  . metoprolol succinate  25 mg Oral BID  . multivitamin with minerals  1 tablet Oral Daily  . pantoprazole  40 mg Oral Daily  . prednisoLONE acetate  1 drop Right Eye BID  . predniSONE  10 mg Oral TID  . thiamine  100 mg Oral Daily   Or  . thiamine  100 mg Intravenous Daily  . vancomycin  500 mg Intravenous Q24H   Continuous Infusions:    Breeona Waid, DO  [facility] Pager [phone]  If 7PM-7AM, please contact night-coverage [email or web address] Password [email or web address] 08/17/2015, 6:54 PM   LOS: 2 days

## 2015-08-17 NOTE — Progress Notes (Signed)
Pharmacy Antibiotic Note  Rodney [name] is a 65 y.o. male admitted on 08/15/2015 with scrotal cellulitis, penile wound.  Patient's currently on vancomycin and Cefepime (abx day #2 with urology recommending treating for 14 days).  - afeb, wbc wnl, scr trending up 2.83 (crcl~23)   Plan:  Change Cefepime to 1g IV q24h.  With rising scr, will d/c standing vancomycin order and check level at 1500 today to assess drug clearance.  Will order maintenance dose based on level.  Follow up renal fxn, culture results, and clinical course.   Height: 5\' 8"  (172.7 cm) Weight: 134 lb (60.782 kg) IBW/kg (Calculated) : 68.4  Temp (24hrs), Avg:97.6 F (36.4 C), Min:97.1 F (36.2 C), Max:98 F (36.7 C)   Recent Labs Lab 08/15/15 1912 08/15/15 1927 08/16/15 0339 08/17/15 0401  WBC 8.7  --  7.3 9.0  CREATININE 1.86*  --  1.96* 2.83*  LATICACIDVEN  --  1.31  --   --     Estimated Creatinine Clearance: 23 mL/min (by C-G formula based on Cr of 2.83).    Allergies  Allergen Reactions  . Other Rash    polyestor and metals except titanium   Antimicrobials this admission: 3/14 zosyn >> 3/15 3/14 vancomycin >>  3/15 Cefepime >>  Dose adjustments this admission: 3/16: reduce cefepime to 1gm q24h  Microbiology results: 3/14 BCx x2: ngtd 3/14 UCx: ngtd 3/3 MRSA PCR (-)   Thank you for allowing pharmacy to be a part of this patient's care.  Dia Sitter, PharmD, BCPS 08/17/2015 7:49 AM

## 2015-08-17 NOTE — Discharge Instructions (Signed)
At discharge: per Dr. Bing Plume re: steroid ophthalmic post-op Cataract surgery, discontinue Durazol (difluprednate) and continue Pred Forte 1 drop right eye bid till 86ml bottle completed. Nyoka Cowden, Rona Ravens PharmD, 3/16, 11:29 AM)

## 2015-08-18 LAB — FIBRINOGEN: Fibrinogen: 499 mg/dL — ABNORMAL HIGH (ref 204–475)

## 2015-08-18 LAB — BASIC METABOLIC PANEL
Anion gap: 8 (ref 5–15)
BUN: 20 mg/dL (ref 6–20)
CALCIUM: 8.5 mg/dL — AB (ref 8.9–10.3)
CHLORIDE: 99 mmol/L — AB (ref 101–111)
CO2: 21 mmol/L — ABNORMAL LOW (ref 22–32)
CREATININE: 2.95 mg/dL — AB (ref 0.61–1.24)
GFR calc Af Amer: 24 mL/min — ABNORMAL LOW (ref >60)
GFR calc non Af Amer: 21 mL/min — ABNORMAL LOW (ref >60)
Glucose, Bld: 117 mg/dL — ABNORMAL HIGH (ref 65–99)
Potassium: 4.6 mmol/L (ref 3.5–5.1)
SODIUM: 128 mmol/L — AB (ref 135–145)

## 2015-08-18 LAB — CBC
HCT: 24.5 % — ABNORMAL LOW (ref 39.0–52.0)
Hemoglobin: 8.5 g/dL — ABNORMAL LOW (ref 13.0–17.0)
MCH: 32.6 pg (ref 26.0–34.0)
MCHC: 34.7 g/dL (ref 30.0–36.0)
MCV: 93.9 fL (ref 78.0–100.0)
PLATELETS: 149 10*3/uL — AB (ref 150–400)
RBC: 2.61 MIL/uL — ABNORMAL LOW (ref 4.22–5.81)
RDW: 14.4 % (ref 11.5–15.5)
WBC: 12 10*3/uL — AB (ref 4.0–10.5)

## 2015-08-18 LAB — HEPATITIS B SURFACE ANTIGEN: Hepatitis B Surface Ag: NEGATIVE

## 2015-08-18 LAB — VITAMIN B12: Vitamin B-12: 2075 pg/mL — ABNORMAL HIGH (ref 180–914)

## 2015-08-18 MED ORDER — PREDNISONE 5 MG PO TABS
10.0000 mg | ORAL_TABLET | Freq: Two times a day (BID) | ORAL | Status: DC
  Administered 2015-08-19 – 2015-08-22 (×8): 10 mg via ORAL
  Filled 2015-08-18 (×8): qty 2

## 2015-08-18 MED ORDER — DOXYCYCLINE HYCLATE 100 MG PO TABS
100.0000 mg | ORAL_TABLET | Freq: Two times a day (BID) | ORAL | Status: DC
  Administered 2015-08-18 – 2015-08-22 (×8): 100 mg via ORAL
  Filled 2015-08-18 (×8): qty 1

## 2015-08-18 NOTE — Progress Notes (Addendum)
PROGRESS NOTE  Rodney [name] [id number] DOB: 1950-09-23 DOA: 08/15/2015 PCP: Simona Huh, MD   Brief History 65 y.o. male with a past medical history significant for CAD, prostate Ca s/p prostatectomy in 2010, and peripheral vascular disease who presents with groin pain. Unfortunately, the patient is a poor historian. He states that approximately 5 days prior to admission, he went to see his primary care physician because of groin pain and dysuria. The patient was treated with ciprofloxacin for presumptive UTI. His symptoms did not improve and he began having groin pain and scrotal swelling. He presented to emergency department where he was found to have hyponatremia with sodium 123 with serum creatinine 1.86.  Assessment/Plan: Scrotal cellulitis -A component of the patient's wound appears to be vascular in origin with non-blanchable erythema -Inciting event may have been due to the patient's underlying dermatologic condition resulting in secondary bacterial infection -In addition, the patient has a degree of anasarca from his hypoalbuminemia as well as prednisone-induced edema -Plan to switch to po abx--doxy -Discontinue Zosyn and cefepime -appreciate urology input -08/15/2015 CT abdomen and pelvis negative for acute intra-abdominal or pelvic abnormalities. Soft tissue edema particularly anteriorly in the pelvis. -08/15/2015 scrotal ultrasound--diffuse skin thickening and edema with bilateral hydroceles -appreciate wound care nurse recommendations Hyponatremia -This appears to be chronic 128-132 -worsening likely due to volume depletion (initially) poor solute intake and impaired Na reabsorption from renal failure as well as medications -Improved with IV fluids-->saline lock now as pt no longer appears volume depleted -BMP in a.m. Bullous pemphigoid  -08/18/15-I spoke with the patient's dermatology office--D. Anderson, PA-C-->on prednisone approx 6 months -they stated  prednisone can be weaned to bid dosing for now Peripheral vascular disease -Patient had aortogram with runoff on 02/10/2015 which revealed bilateral calcific plaques in the common femoral arteries causing approximately 80% stenosis of each common femoral artery AKI -renal ultrasound-no hydronephrosis -while initially may have been due to volume depletion, now contrast nephropathy may be primary issue -08/18/2015--serum creatinine appears to be reaching plateau Thrombocytopenia -HIV--neg -hep c antibody neg -Serum B12--2075 -Check fibrinogen--499 -INR 0.90, PTT 28 -Improving with treatment of infection Coronary artery disease -08/04/2015 heart catheterization--nonobstructive CAD -Continue Lipitor, metoprolol succinate Severe malnutrition -Patient declined nutritional supplements Alcohol dependence -Patient states that he drinks 6-8 beers daily -Alcohol withdrawal protocol   Family Communication: Nephew update at beside 3/17--total time 35 min, >50% spent counseling and coordinating care Disposition Plan: Home  Vs. SNF in 2-3 days    Procedures/Studies: Dg Chest 2 View  08/15/2015  CLINICAL DATA:  Shortness of breath. EXAM: CHEST  2 VIEW COMPARISON:  August 03, 2015. FINDINGS: The heart size and mediastinal contours are within normal limits. Both lungs are clear. No pneumothorax or pleural effusion is noted. The visualized skeletal structures are unremarkable. IMPRESSION: No active cardiopulmonary disease. Electronically Signed   By: Marijo Conception, M.D.   On: 08/15/2015 15:42   Dg Chest 2 View  08/03/2015  CLINICAL DATA:  Chest pain starting today. Myocardial infarction in 2009. EXAM: CHEST  2 VIEW COMPARISON:  02/16/2015 FINDINGS: Emphysema. Mild atherosclerotic calcification of the aortic arch. Heart size within normal limits. Left lateral ninth and tenth rib irregularities, probably old fractures but not readily visible on 02/09/2015 CT, correlate with any point tenderness along  the left lower chest. Biapical pleural parenchymal scarring.  No pleural effusion. IMPRESSION: 1. Left lateral ninth and tenth rib irregularities probably from old fractures, correlate with any  point tenderness in this vicinity in assessing for more recent fracture. 2. Emphysema. 3. Atherosclerotic aortic arch. Electronically Signed   By: Van Clines M.D.   On: 08/03/2015 16:55   US Abdomen Complete  08/16/2015  CLINICAL DATA:  Generalized abdominal pain EXAM: ABDOMEN ULTRASOUND COMPLETE COMPARISON:  None. FINDINGS: Gallbladder: No gallstones or wall thickening visualized. No sonographic Murphy sign noted by sonographer. Common bile duct: Diameter: 3.4 mm Liver: No focal lesion identified. Within normal limits in parenchymal echogenicity. IVC: No abnormality visualized. Pancreas: Visualized portion unremarkable. Spleen: Size and appearance within normal limits. Right Kidney: Length: 10.2 cm. Echogenicity within normal limits. No mass or hydronephrosis visualized. Left Kidney: Length: 9.6 cm. Echogenicity within normal limits. No mass or hydronephrosis visualized. Abdominal aorta: No aneurysm visualized. Other findings: Small right pleural effusion. IMPRESSION: 1. No cholelithiasis or sonographic evidence of acute cholecystitis. 2. No obstructive uropathy. Electronically Signed   By: Kathreen Devoid   On: 08/16/2015 16:25   US Scrotum  08/15/2015  CLINICAL DATA:  Enlarged painful, inflamed scrotum EXAM: ULTRASOUND OF SCROTUM TECHNIQUE: Complete ultrasound examination of the testicles, epididymis, and other scrotal structures was performed. COMPARISON:  None available FINDINGS: Right testicle Measurements: 3.2 x 2.2 x 2.3 cm. No mass or microlithiasis visualized. Left testicle Measurements: 3.2 x 1.8 x 2.3 cm. No mass or microlithiasis visualized. Right epididymis: Enlarged, heterogeneous in appearance, no significant hypervascularity. Left epididymis: Small left epididymal incidental cyst measures 3 mm.  Left epididymis also appears enlarged, heterogeneous and ill-defined. Hydrocele: Heterogeneous complex mixed echogenicity hydroceles present bilaterally with diffuse scrotal edema and skin thickening, difficult to exclude pyocele / infection. Varicocele:  None visualized. IMPRESSION: Diffuse scrotal skin thickening and edema with bilateral complex heterogeneous hydroceles and/or pyoceles. Difficult to exclude abscess. No testicular abnormality. Electronically Signed   By: Jerilynn Mages.  Shick M.D.   On: 08/15/2015 16:42   Ct Abdomen Pelvis W Contrast  08/15/2015  CLINICAL DATA:  Groin pain with walking and moving since 08/10/2015, swelling, dysuria, on Cipro for possible UTI, pain is burning and non radiating, no fever or chills, history prostate cancer, former smoker EXAM: CT ABDOMEN AND PELVIS WITH CONTRAST TECHNIQUE: Multidetector CT imaging of the abdomen and pelvis was performed using the standard protocol following bolus administration of intravenous contrast. Sagittal and coronal MPR images reconstructed from axial data set. CONTRAST:  126mL OMNIPAQUE IOHEXOL 300 MG/ML SOLN IV. Dilute oral contrast. COMPARISON:  CT abdomen and pelvis 02/09/2015 FINDINGS: Lung bases clear. Atrophic pancreas. Liver, gallbladder, spleen, pancreas, kidneys, and adrenal glands otherwise unremarkable. Extensive atherosclerotic calcification greatest at the iliac arteries. Coronary arterial calcifications as well. 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 Normal appendix. Unremarkable bladder and ureters. Prostate gland surgically absent. Question small BILATERAL inguinal hernias containing fat. Scattered edema within the subcutaneous soft tissues at the inferior pelvis particularly anteriorly. Significant soft tissue swelling/ edema and question hydroceles in scrotum. Stomach and bowel loops normal appearance. No mass, adenopathy, free air, or free fluid. No acute osseous lesions. IMPRESSION: Question  small BILATERAL inguinal hernias containing fat. Significant soft tissue swelling of the scrotum with question BILATERAL hydroceles. No acute intra-abdominal or intrapelvic abnormalities otherwise seen. Extensive atherosclerotic calcification as above. Electronically Signed   By: Lavonia Dana M.D.   On: 08/15/2015 17:25         Subjective: Patient feels that scrotal edema is improving. Denies fever, chills, chest pain, shortness breath, nausea, vomiting, diarrhea, abdominal pain. The patient is able to urinate spontaneously.  Objective: Filed Vitals:   08/17/15 1310 08/17/15 2300 08/18/15 0440 08/18/15 1502  BP: 149/58 113/67 148/83 106/67  Pulse: 64 76 82 67  Temp: 97.6 F (36.4 C) 98.1 F (36.7 C) 98.1 F (36.7 C) 97.5 F (36.4 C)  TempSrc: Oral Oral Oral Oral  Resp: 18 16 16 18   Height:      Weight:      SpO2: 99% 97% 93% 100%   No intake or output data in the 24 hours ending 08/18/15 1838 Weight change:  Exam:   General:  Pt is alert, follows commands appropriately, not in acute distress  HEENT: No icterus, No thrush, No neck mass, [place]/AT  Cardiovascular: RRR, S1/S2, no rubs, no gallops  Respiratory: Diminished breath sense bilateral, butclear to auscultation  Abdomen: Soft/+BS, non tender, non distended, no guarding; no hepatosplenomegaly  Extremities: 1+LE edema, No lymphangitis, No petechiae, No rashes, no synovitis; bilateral scrotal edema with pelvic anasarca. Mild erythema without any lymphangitis.  Data Reviewed: Basic Metabolic Panel:  Recent Labs Lab 08/15/15 1912 08/16/15 0339 08/17/15 0401 08/18/15 0334  NA 123* 127* 124* 128*  K 4.0 3.6 4.0 4.6  CL 92* 95* 96* 99*  CO2 22 23 21* 21*  GLUCOSE 91 109* 109* 117*  BUN 15 17 18 20   CREATININE 1.86* 1.96* 2.83* 2.95*  CALCIUM 8.2* 8.2* 7.9* 8.5*   Liver Function Tests:  Recent Labs Lab 08/15/15 1912 08/17/15 0401  AST 26 14*  ALT 35 27  ALKPHOS 82 66  BILITOT 0.8 0.7  PROT 4.8* 4.4*    ALBUMIN 2.1* 1.9*   No results for input(s): LIPASE, AMYLASE in the last 168 hours. No results for input(s): AMMONIA in the last 168 hours. CBC:  Recent Labs Lab 08/15/15 1912 08/16/15 0339 08/17/15 0401 08/18/15 0334  WBC 8.7 7.3 9.0 12.0*  NEUTROABS 7.6  --   --   --   HGB 9.1* 8.4* 8.2* 8.5*  HCT 26.0* 23.7* 24.1* 24.5*  MCV 91.9 92.9 96.8 93.9  PLT 80* 74* 89* 149*   Cardiac Enzymes: No results for input(s): CKTOTAL, CKMB, CKMBINDEX, TROPONINI in the last 168 hours. BNP: Invalid input(s): POCBNP CBG:  Recent Labs Lab 08/17/15 0809  GLUCAP 90    Recent Results (from the past 240 hour(s))  Culture, blood (Routine X 2) w Reflex to ID Panel     Status: None (Preliminary result)   Collection Time: 08/15/15  7:02 PM  Result Value Ref Range Status   Specimen Description BLOOD LEFT ARM  Final   Special Requests BOTTLES DRAWN AEROBIC AND ANAEROBIC  5CC  Final   Culture   Final    NO GROWTH 3 DAYS Performed at [facility]    Report Status PENDING  Incomplete  Culture, blood (Routine X 2) w Reflex to ID Panel     Status: None (Preliminary result)   Collection Time: 08/15/15  7:14 PM  Result Value Ref Range Status   Specimen Description BLOOD LEFT ARM  Final   Special Requests BOTTLES DRAWN AEROBIC AND ANAEROBIC  5CC  Final   Culture   Final    NO GROWTH 3 DAYS Performed at [facility]    Report Status PENDING  Incomplete  Urine culture     Status: None   Collection Time: 08/15/15  7:46 PM  Result Value Ref Range Status   Specimen Description URINE, CLEAN CATCH  Final   Special Requests NONE  Final   Culture   Final  MULTIPLE SPECIES PRESENT, SUGGEST RECOLLECTION Performed at [facility]    Report Status 08/17/2015 FINAL  Final     Scheduled Meds: . aspirin EC  81 mg Oral Daily  . atorvastatin  80 mg Oral q1800  . folic acid  1 mg Oral Daily  . latanoprost  1 drop Both Eyes QHS  . LORazepam  1 mg Intravenous Once  . metoprolol  succinate  25 mg Oral BID  . multivitamin with minerals  1 tablet Oral Daily  . pantoprazole  40 mg Oral Daily  . prednisoLONE acetate  1 drop Right Eye BID  . predniSONE  10 mg Oral TID  . thiamine  100 mg Oral Daily  . vancomycin  500 mg Intravenous Q24H   Continuous Infusions:    Kurtiss Wence, DO  [facility] Pager [phone]  If 7PM-7AM, please contact night-coverage [email or web address] Password [facility] 08/18/2015, 6:38 PM   LOS: 3 days

## 2015-08-19 LAB — CBC
HCT: 28.1 % — ABNORMAL LOW (ref 39.0–52.0)
HEMOGLOBIN: 9.3 g/dL — AB (ref 13.0–17.0)
MCH: 32.6 pg (ref 26.0–34.0)
MCHC: 33.1 g/dL (ref 30.0–36.0)
MCV: 98.6 fL (ref 78.0–100.0)
PLATELETS: 202 10*3/uL (ref 150–400)
RBC: 2.85 MIL/uL — AB (ref 4.22–5.81)
RDW: 14.6 % (ref 11.5–15.5)
WBC: 12.2 10*3/uL — AB (ref 4.0–10.5)

## 2015-08-19 LAB — BASIC METABOLIC PANEL
ANION GAP: 6 (ref 5–15)
BUN: 21 mg/dL — ABNORMAL HIGH (ref 6–20)
CALCIUM: 8.7 mg/dL — AB (ref 8.9–10.3)
CHLORIDE: 98 mmol/L — AB (ref 101–111)
CO2: 22 mmol/L (ref 22–32)
CREATININE: 2.52 mg/dL — AB (ref 0.61–1.24)
GFR calc non Af Amer: 26 mL/min — ABNORMAL LOW (ref >60)
GFR, EST AFRICAN AMERICAN: 30 mL/min — AB (ref >60)
Glucose, Bld: 95 mg/dL (ref 65–99)
Potassium: 4.5 mmol/L (ref 3.5–5.1)
SODIUM: 126 mmol/L — AB (ref 135–145)

## 2015-08-19 MED ORDER — CEPHALEXIN 500 MG PO CAPS
500.0000 mg | ORAL_CAPSULE | Freq: Two times a day (BID) | ORAL | Status: DC
  Administered 2015-08-19 – 2015-08-22 (×6): 500 mg via ORAL
  Filled 2015-08-19 (×6): qty 1

## 2015-08-19 NOTE — Progress Notes (Signed)
Subjective:   Rodney [name] is a 65 y.o. with complex medical history including: 1.  prostate cancer s/p RALP in 2010, and significant PVD but significantly no DM. Note pelvic radiation therapy.  2.  Admitted with 5 days of painful scrotal swelling. He reports that his dermatologist gave him 14 days of Cipro which he has been taking with some improvement. The patient came in to the ED with persistent swelling and pain. While here he had an ultrasound and CT with no concerning findings but was found to have AKI with a cr of 1.8 from a baseline below 1, hyponatremia of 123 and anemia with a hgb of 9. The patient denies any urinary symptoms. A penile lesion was noted and the patient reports that this started yesterday which appears inconsistent with the wound. 3. Pt reports chronic prednisone therapy for skin disease ( ? Related to cellulitis)  Objective: Vital signs in last 24 hours: Temp:  [97.2 F (36.2 C)-97.5 F (36.4 C)] 97.3 F (36.3 C) (03/18 0605) Pulse Rate:  [66-90] 90 (03/18 0605) Resp:  [16-18] 16 (03/18 0605) BP: (102-143)/(60-90) 143/90 mmHg (03/18 0605) SpO2:  [99 %-100 %] 100 % (03/18 0605)A  Intake/Output from previous day: 03/17 0701 - 03/18 0700 In: 120 [P.O.:120] Out: -  Intake/Output this shift:    Past Medical History  Diagnosis Date  . Prostate cancer ([place]) 05/03/2009    Prostatectomy/Adenocrcinoma  . Heart murmur     history  . Cataract     left eye surgery/   . Radiation 07/02/11-08/22/11    Prostate fossa 68.4 gray 38 fractions    Physical Exam:  Lungs - Normal respiratory effort, chest expands symmetrically.  Abdomen - Soft, non-tender & non-distended. Penis and scrotum: Erythematous rash continues ( appears to be cellulitis, despite antibiotics). Dressings intact.  Lab Results:  Recent Labs  08/17/15 0401 08/18/15 0334 08/19/15 0420  WBC 9.0 12.0* 12.2*  HGB 8.2* 8.5* 9.3*  HCT 24.1* 24.5* 28.1*   BMET  Recent Labs  08/18/15 0334  08/19/15 0420  NA 128* 126*  K 4.6 4.5  CL 99* 98*  CO2 21* 22  GLUCOSE 117* 95  BUN 20 21*  CREATININE 2.95* 2.52*  CALCIUM 8.5* 8.7*   No results for input(s): LABURIN in the last 72 hours. Results for orders placed or performed during the hospital encounter of 08/15/15  Culture, blood (Routine X 2) w Reflex to ID Panel     Status: None (Preliminary result)   Collection Time: 08/15/15  7:02 PM  Result Value Ref Range Status   Specimen Description BLOOD LEFT ARM  Final   Special Requests BOTTLES DRAWN AEROBIC AND ANAEROBIC  5CC  Final   Culture   Final    NO GROWTH 3 DAYS Performed at [facility]    Report Status PENDING  Incomplete  Culture, blood (Routine X 2) w Reflex to ID Panel     Status: None (Preliminary result)   Collection Time: 08/15/15  7:14 PM  Result Value Ref Range Status   Specimen Description BLOOD LEFT ARM  Final   Special Requests BOTTLES DRAWN AEROBIC AND ANAEROBIC  5CC  Final   Culture   Final    NO GROWTH 3 DAYS Performed at [facility]    Report Status PENDING  Incomplete  Urine culture     Status: None   Collection Time: 08/15/15  7:46 PM  Result Value Ref Range Status   Specimen Description URINE, CLEAN CATCH  Final   Special Requests NONE  Final   Culture   Final    MULTIPLE SPECIES PRESENT, SUGGEST RECOLLECTION Performed at [facility]    Report Status 08/17/2015 FINAL  Final    Studies/Results: No results found.  Assessment:  Pt appears to have continued cellulits, but wbc looks ok. He is to f/u Urology ( Dr. Risa Grill ) in 10 days. He says he is going to SNF.   Plan:  Return to Dr. Risa Grill in approx 10 days after discharge.   Rodney [name] Rodney [name] 08/19/2015, 9:02 AM

## 2015-08-19 NOTE — Progress Notes (Signed)
PROGRESS NOTE  Rodney [name] [id number] DOB: 06/19/1950 DOA: 08/15/2015 PCP: Simona Huh, MD  Brief History 65 y.o. male with a past medical history significant for CAD, prostate Ca s/p prostatectomy in 2010, and peripheral vascular disease who presents with groin pain. Unfortunately, the patient is a poor historian. He states that approximately 5 days prior to admission, he went to see his primary care physician because of groin pain and dysuria. The patient was treated with ciprofloxacin for presumptive UTI. His symptoms did not improve and he began having groin pain and scrotal swelling. He presented to emergency department where he was found to have hyponatremia with sodium 123 with serum creatinine 1.86. After admission, the patient was started on intravenous vancomycin with gradual improvement of his scrotal erythema and edema.  Assessment/Plan: Scrotal cellulitis -A component of the patient's wound appears to be vascular in origin with non-blanchable erythema -Inciting event may have been due to the patient's underlying dermatologic condition resulting in secondary bacterial infection -his penile lesion was likely related to his pemphigoid -In addition, the patient has a degree of anasarca from his hypoalbuminemia as well as prednisone-induced edema -08/19/15--switch to po abx--doxy+cephalexin -Discontinue Zosyn and cefepime -appreciate urology input -08/15/2015 CT abdomen and pelvis negative for acute intra-abdominal or pelvic abnormalities. Soft tissue edema particularly anteriorly in the pelvis. -08/15/2015 scrotal ultrasound--diffuse skin thickening and edema with bilateral hydroceles -appreciate wound care nurse recommendations Hyponatremia -This appears to be chronic 128-132 -worsening likely due to volume depletion (initially) poor solute intake and impaired Na reabsorption from renal failure as well as medications -Improved with IV fluids-->saline lock now as  pt no longer appears volume depleted -BMP in a.m. Bullous pemphigoid  -08/18/15-I spoke with the patient's dermatology office--D. Anderson, PA-C-->on prednisone approx 6 months -they stated prednisone can be weaned to bid dosing for now Peripheral vascular disease -Patient had aortogram with runoff on 02/10/2015 which revealed bilateral calcific plaques in the common femoral arteries causing approximately 80% stenosis of each common femoral artery AKI -renal ultrasound-no hydronephrosis -while initially may have been due to volume depletion, now contrast nephropathy may be primary issue -08/18/2015--serum creatinine appears to be reaching plateau -08/19/15--renal function improving Thrombocytopenia -HIV--neg -hep c antibody neg  -hepatitis B surface antigen negative -Serum B12--2075 -Check fibrinogen--499 -INR 0.90, PTT 28 -Improving with treatment of infection Coronary artery disease -08/04/2015 heart catheterization--nonobstructive CAD -Continue Lipitor, metoprolol succinate Severe malnutrition -Patient declined nutritional supplements Alcohol dependence -Patient states that he drinks 6-8 beers daily -Alcohol withdrawal protocol   Family Communication: Nephew update at beside 3/17 Disposition Plan: Home Vs. SNF in 1-2 days     Procedures/Studies: Dg Chest 2 View  08/15/2015  CLINICAL DATA:  Shortness of breath. EXAM: CHEST  2 VIEW COMPARISON:  August 03, 2015. FINDINGS: The heart size and mediastinal contours are within normal limits. Both lungs are clear. No pneumothorax or pleural effusion is noted. The visualized skeletal structures are unremarkable. IMPRESSION: No active cardiopulmonary disease. Electronically Signed   By: Marijo Conception, M.D.   On: 08/15/2015 15:42   Dg Chest 2 View  08/03/2015  CLINICAL DATA:  Chest pain starting today. Myocardial infarction in 2009. EXAM: CHEST  2 VIEW COMPARISON:  02/16/2015 FINDINGS: Emphysema. Mild atherosclerotic calcification  of the aortic arch. Heart size within normal limits. Left lateral ninth and tenth rib irregularities, probably old fractures but not readily visible on 02/09/2015 CT, correlate with any point tenderness along the left lower chest.  Biapical pleural parenchymal scarring.  No pleural effusion. IMPRESSION: 1. Left lateral ninth and tenth rib irregularities probably from old fractures, correlate with any point tenderness in this vicinity in assessing for more recent fracture. 2. Emphysema. 3. Atherosclerotic aortic arch. Electronically Signed   By: Van Clines M.D.   On: 08/03/2015 16:55   US Abdomen Complete  08/16/2015  CLINICAL DATA:  Generalized abdominal pain EXAM: ABDOMEN ULTRASOUND COMPLETE COMPARISON:  None. FINDINGS: Gallbladder: No gallstones or wall thickening visualized. No sonographic Murphy sign noted by sonographer. Common bile duct: Diameter: 3.4 mm Liver: No focal lesion identified. Within normal limits in parenchymal echogenicity. IVC: No abnormality visualized. Pancreas: Visualized portion unremarkable. Spleen: Size and appearance within normal limits. Right Kidney: Length: 10.2 cm. Echogenicity within normal limits. No mass or hydronephrosis visualized. Left Kidney: Length: 9.6 cm. Echogenicity within normal limits. No mass or hydronephrosis visualized. Abdominal aorta: No aneurysm visualized. Other findings: Small right pleural effusion. IMPRESSION: 1. No cholelithiasis or sonographic evidence of acute cholecystitis. 2. No obstructive uropathy. Electronically Signed   By: Kathreen Devoid   On: 08/16/2015 16:25   US Scrotum  08/15/2015  CLINICAL DATA:  Enlarged painful, inflamed scrotum EXAM: ULTRASOUND OF SCROTUM TECHNIQUE: Complete ultrasound examination of the testicles, epididymis, and other scrotal structures was performed. COMPARISON:  None available FINDINGS: Right testicle Measurements: 3.2 x 2.2 x 2.3 cm. No mass or microlithiasis visualized. Left testicle Measurements: 3.2 x 1.8 x  2.3 cm. No mass or microlithiasis visualized. Right epididymis: Enlarged, heterogeneous in appearance, no significant hypervascularity. Left epididymis: Small left epididymal incidental cyst measures 3 mm. Left epididymis also appears enlarged, heterogeneous and ill-defined. Hydrocele: Heterogeneous complex mixed echogenicity hydroceles present bilaterally with diffuse scrotal edema and skin thickening, difficult to exclude pyocele / infection. Varicocele:  None visualized. IMPRESSION: Diffuse scrotal skin thickening and edema with bilateral complex heterogeneous hydroceles and/or pyoceles. Difficult to exclude abscess. No testicular abnormality. Electronically Signed   By: Jerilynn Mages.  Shick M.D.   On: 08/15/2015 16:42   Ct Abdomen Pelvis W Contrast  08/15/2015  CLINICAL DATA:  Groin pain with walking and moving since 08/10/2015, swelling, dysuria, on Cipro for possible UTI, pain is burning and non radiating, no fever or chills, history prostate cancer, former smoker EXAM: CT ABDOMEN AND PELVIS WITH CONTRAST TECHNIQUE: Multidetector CT imaging of the abdomen and pelvis was performed using the standard protocol following bolus administration of intravenous contrast. Sagittal and coronal MPR images reconstructed from axial data set. CONTRAST:  12mL OMNIPAQUE IOHEXOL 300 MG/ML SOLN IV. Dilute oral contrast. COMPARISON:  CT abdomen and pelvis 02/09/2015 FINDINGS: Lung bases clear. Atrophic pancreas. Liver, gallbladder, spleen, pancreas, kidneys, and adrenal glands otherwise unremarkable. Extensive atherosclerotic calcification greatest at the iliac arteries. Coronary arterial calcifications as well. 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 Normal appendix. Unremarkable bladder and ureters. Prostate gland surgically absent. Question small BILATERAL inguinal hernias containing fat. Scattered edema within the subcutaneous soft tissues at the inferior pelvis particularly anteriorly.  Significant soft tissue swelling/ edema and question hydroceles in scrotum. Stomach and bowel loops normal appearance. No mass, adenopathy, free air, or free fluid. No acute osseous lesions. IMPRESSION: Question small BILATERAL inguinal hernias containing fat. Significant soft tissue swelling of the scrotum with question BILATERAL hydroceles. No acute intra-abdominal or intrapelvic abnormalities otherwise seen. Extensive atherosclerotic calcification as above. Electronically Signed   By: Lavonia Dana M.D.   On: 08/15/2015 17:25         Subjective: Patient states that  his scrotum is getting better and less swollen although still tender. Denies any fevers, chills, chest discomfort, shortness breath, nausea, vomiting or diarrhea. He still has some suprapubic pain, he is able to urinate spontaneously without any dysuria.  Objective: Filed Vitals:   08/18/15 2237 08/19/15 0000 08/19/15 0605 08/19/15 1300  BP: 102/60 104/64 143/90 109/55  Pulse: 66 68 90 81  Temp: 97.2 F (36.2 C)  97.3 F (36.3 C) 98.3 F (36.8 C)  TempSrc: Oral  Oral Oral  Resp: 16  16 18   Height:      Weight:      SpO2: 99%  100% 99%    Intake/Output Summary (Last 24 hours) at 08/19/15 1658 Last data filed at 08/19/15 1300  Gross per 24 hour  Intake    720 ml  Output      0 ml  Net    720 ml   Weight change:  Exam:   General:  Pt is alert, follows commands appropriately, not in acute distress  HEENT: No icterus, No thrush, No neck mass, [place]/AT  Cardiovascular: RRR, S1/S2, no rubs, no gallops  Respiratory: CTA bilaterally, no wheezing, no crackles, no rhonchi  Abdomen: Soft/+BS, non tender, non distended, no guarding; ;Suprapubic and inguinal edema without any lymphadenopathy. Minimal erythema. No crepitance. No draining wounds  Extremities: trace LE edema, No lymphangitis, No petechiae, No rashes, no synovitis  Data Reviewed: Basic Metabolic Panel:  Recent Labs Lab 08/15/15 1912 08/16/15 0339  08/17/15 0401 08/18/15 0334 08/19/15 0420  NA 123* 127* 124* 128* 126*  K 4.0 3.6 4.0 4.6 4.5  CL 92* 95* 96* 99* 98*  CO2 22 23 21* 21* 22  GLUCOSE 91 109* 109* 117* 95  BUN 15 17 18 20  21*  CREATININE 1.86* 1.96* 2.83* 2.95* 2.52*  CALCIUM 8.2* 8.2* 7.9* 8.5* 8.7*   Liver Function Tests:  Recent Labs Lab 08/15/15 1912 08/17/15 0401  AST 26 14*  ALT 35 27  ALKPHOS 82 66  BILITOT 0.8 0.7  PROT 4.8* 4.4*  ALBUMIN 2.1* 1.9*   No results for input(s): LIPASE, AMYLASE in the last 168 hours. No results for input(s): AMMONIA in the last 168 hours. CBC:  Recent Labs Lab 08/15/15 1912 08/16/15 0339 08/17/15 0401 08/18/15 0334 08/19/15 0420  WBC 8.7 7.3 9.0 12.0* 12.2*  NEUTROABS 7.6  --   --   --   --   HGB 9.1* 8.4* 8.2* 8.5* 9.3*  HCT 26.0* 23.7* 24.1* 24.5* 28.1*  MCV 91.9 92.9 96.8 93.9 98.6  PLT 80* 74* 89* 149* 202   Cardiac Enzymes: No results for input(s): CKTOTAL, CKMB, CKMBINDEX, TROPONINI in the last 168 hours. BNP: Invalid input(s): POCBNP CBG:  Recent Labs Lab 08/17/15 0809  GLUCAP 90    Recent Results (from the past 240 hour(s))  Culture, blood (Routine X 2) w Reflex to ID Panel     Status: None (Preliminary result)   Collection Time: 08/15/15  7:02 PM  Result Value Ref Range Status   Specimen Description BLOOD LEFT ARM  Final   Special Requests BOTTLES DRAWN AEROBIC AND ANAEROBIC  5CC  Final   Culture   Final    NO GROWTH 4 DAYS Performed at [facility]    Report Status PENDING  Incomplete  Culture, blood (Routine X 2) w Reflex to ID Panel     Status: None (Preliminary result)   Collection Time: 08/15/15  7:14 PM  Result Value Ref Range Status   Specimen Description BLOOD  LEFT ARM  Final   Special Requests BOTTLES DRAWN AEROBIC AND ANAEROBIC  5CC  Final   Culture   Final    NO GROWTH 4 DAYS Performed at [facility]    Report Status PENDING  Incomplete  Urine culture     Status: None   Collection Time: 08/15/15  7:46  PM  Result Value Ref Range Status   Specimen Description URINE, CLEAN CATCH  Final   Special Requests NONE  Final   Culture   Final    MULTIPLE SPECIES PRESENT, SUGGEST RECOLLECTION Performed at [facility]    Report Status 08/17/2015 FINAL  Final     Scheduled Meds: . aspirin EC  81 mg Oral Daily  . atorvastatin  80 mg Oral q1800  . doxycycline  100 mg Oral Q12H  . folic acid  1 mg Oral Daily  . latanoprost  1 drop Both Eyes QHS  . LORazepam  1 mg Intravenous Once  . metoprolol succinate  25 mg Oral BID  . multivitamin with minerals  1 tablet Oral Daily  . pantoprazole  40 mg Oral Daily  . prednisoLONE acetate  1 drop Right Eye BID  . predniSONE  10 mg Oral BID WC  . thiamine  100 mg Oral Daily   Continuous Infusions:    Stephie Xu, DO  [facility] Pager [phone]  If 7PM-7AM, please contact night-coverage [email or web address] Password [email or web address] 08/19/2015, 4:58 PM   LOS: 4 days

## 2015-08-19 NOTE — Progress Notes (Signed)
PT Cancellation Note  Patient Details Name: LEALAND RIZK MRN: [id number] DOB: 1950/10/29   Cancelled Treatment:    Reason Eval/Treat Not Completed: Pain limiting ability to participate. Also, pt currently eating lunch. Pt politely requested PT to check back on tomorrow.    Weston Anna, MPT Pager: [phone]

## 2015-08-20 LAB — CBC
HEMATOCRIT: 29.5 % — AB (ref 39.0–52.0)
HEMOGLOBIN: 9.8 g/dL — AB (ref 13.0–17.0)
MCH: 32.8 pg (ref 26.0–34.0)
MCHC: 33.2 g/dL (ref 30.0–36.0)
MCV: 98.7 fL (ref 78.0–100.0)
Platelets: 268 10*3/uL (ref 150–400)
RBC: 2.99 MIL/uL — AB (ref 4.22–5.81)
RDW: 15.1 % (ref 11.5–15.5)
WBC: 14.1 10*3/uL — AB (ref 4.0–10.5)

## 2015-08-20 LAB — BASIC METABOLIC PANEL
ANION GAP: 8 (ref 5–15)
BUN: 23 mg/dL — AB (ref 6–20)
CHLORIDE: 99 mmol/L — AB (ref 101–111)
CO2: 22 mmol/L (ref 22–32)
Calcium: 8.9 mg/dL (ref 8.9–10.3)
Creatinine, Ser: 2.25 mg/dL — ABNORMAL HIGH (ref 0.61–1.24)
GFR calc Af Amer: 34 mL/min — ABNORMAL LOW (ref >60)
GFR, EST NON AFRICAN AMERICAN: 29 mL/min — AB (ref >60)
Glucose, Bld: 106 mg/dL — ABNORMAL HIGH (ref 65–99)
POTASSIUM: 4.6 mmol/L (ref 3.5–5.1)
SODIUM: 129 mmol/L — AB (ref 135–145)

## 2015-08-20 LAB — CULTURE, BLOOD (ROUTINE X 2)
CULTURE: NO GROWTH
CULTURE: NO GROWTH

## 2015-08-20 NOTE — Evaluation (Signed)
Physical Therapy Evaluation Patient Details Name: Rodney [name] MRN: [id number] DOB: 1951/02/05 Today's Date: 08/20/2015   History of Present Illness  65 y.o. male with a past medical history significant for CAD, prostate Ca , and peripheral vascular disease who presents 08/15/15  with groin pain. RECENT  treatment   for presumptive UTI. In the ED he was found to have hyponatremia with sodium 123   Clinical Impression  The patient is very limited in mobility due to pain and edema  Of scrotum. He barely tolerated sitting on the edge of the bed.  Pt admitted with above diagnosis. Pt currently with functional limitations due to the deficits listed below (see PT Problem List).  Pt will benefit from skilled PT to increase their independence and safety with mobility to allow discharge to the venue listed below.       Follow Up Recommendations Home health PT;Supervision/Assistance - 24 hour (may not need if wounds/pain improve)    Equipment Recommendations  Rolling walker with 5" wheels    Recommendations for Other Services       Precautions / Restrictions Precautions Precautions: Fall Precaution Comments: painful and edema scrotal area      Mobility  Bed Mobility Overal bed mobility: +2 for physical assistance;+ 2 for safety/equipment;Needs Assistance Bed Mobility: Supine to Sit;Sit to Supine     Supine to sit: Total assist;+2 for physical assistance;+2 for safety/equipment Sit to supine: Total assist;+2 for physical assistance;+2 for safety/equipment   General bed mobility comments: use of bed pad to slide  around to partial sitting and then back into the bed, the patient could not tolerate a the pressure of sitting.  Transfers Overall transfer level: Needs assistance   Transfers: Sit to/from Stand           General transfer comment: unable to attempt due to pain  Ambulation/Gait                Stairs            Wheelchair Mobility    Modified Rankin  (Stroke Patients Only)       Balance Overall balance assessment: Needs assistance Sitting-balance support: Bilateral upper extremity supported;Feet supported   Sitting balance - Comments: has lean back due top discomfort of scrotum                                     Pertinent Vitals/Pain Pain Assessment: 0-10 Pain Score: 10-Worst pain ever Pain Location: scrotum area Pain Descriptors / Indicators: Discomfort;Grimacing;Guarding;Pressure;Tender Pain Intervention(s): Limited activity within patient's tolerance;Patient requesting pain meds-RN notified    Home Living Family/patient expects to be discharged to:: Private residence   Available Help at Discharge: Family;Available PRN/intermittently Type of Home: House Home Access: Stairs to enter Entrance Stairs-Rails: Right;Left Entrance Stairs-Number of Steps: 4 Home Layout: Multi-level;Able to live on main level with bedroom/bathroom Home Equipment: None      Prior Function Level of Independence: Independent               Hand Dominance        Extremity/Trunk Assessment   Upper Extremity Assessment: Overall WFL for tasks assessed           Lower Extremity Assessment: LLE deficits/detail;RLE deficits/detail      Cervical / Trunk Assessment: Normal  Communication   Communication: No difficulties  Cognition Arousal/Alertness: Awake/alert Behavior During Therapy: WFL for tasks assessed/performed Overall Cognitive Status: Within  Functional Limits for tasks assessed                      General Comments      Exercises        Assessment/Plan    PT Assessment Patient needs continued PT services  PT Diagnosis Difficulty walking;Acute pain   PT Problem List Decreased range of motion;Decreased activity tolerance;Decreased mobility;Pain;Decreased knowledge of use of DME;Decreased skin integrity  PT Treatment Interventions DME instruction;Gait training;Stair training;Functional  mobility training;Therapeutic activities;Therapeutic exercise;Patient/family education   PT Goals (Current goals can be found in the Care Plan section) Acute Rehab PT Goals Patient Stated Goal: to be able to walk, no paIN. PT Goal Formulation: With patient Time For Goal Achievement: 09/03/15 Potential to Achieve Goals: Good    Frequency Min 3X/week   Barriers to discharge        Co-evaluation               End of Session   Activity Tolerance: Patient limited by pain Patient left: in bed;with call bell/phone within reach;with bed alarm set Nurse Communication: Mobility status         Time: [id number] PT Time Calculation (min) (ACUTE ONLY): 12 min   Charges:   PT Evaluation $PT Eval Low Complexity: 1 Procedure     PT G CodesMarcelino Freestone PT [id number]  08/20/2015, 1:02 PM

## 2015-08-20 NOTE — Progress Notes (Signed)
PROGRESS NOTE  Rodney [name] [id number] DOB: 08/28/1950 DOA: 08/15/2015 PCP: Simona Huh, MD  Brief History 65 y.o. male with a past medical history significant for CAD, prostate Ca s/p prostatectomy in 2010, and peripheral vascular disease who presents with groin pain. Unfortunately, the patient is a poor historian. He states that approximately 5 days prior to admission, he went to see his primary care physician because of groin pain and dysuria. The patient was treated with ciprofloxacin for presumptive UTI. His symptoms did not improve and he began having groin pain and scrotal swelling. He presented to emergency department where he was found to have hyponatremia with sodium 123 with serum creatinine 1.86. After admission, the patient was started on intravenous vancomycin with gradual improvement of his scrotal erythema and edema. He was transitioned to oral antibiotics and remained clinically stable.  Assessment/Plan: Scrotal cellulitis -A component of the patient's wound appears to be vascular in origin with non-blanchable erythema -Inciting event may have been due to the patient's underlying dermatologic condition resulting in secondary bacterial infection -his penile lesion was likely related to his pemphigoid -In addition, the patient has a degree of anasarca from his hypoalbuminemia as well as prednisone-induced edema -08/19/15--switch to po abx--doxy+cephalexin -Discontinue Zosyn and cefepime -appreciate urology input -08/15/2015 CT abdomen and pelvis negative for acute intra-abdominal or pelvic abnormalities. Soft tissue edema particularly anteriorly in the pelvis. -08/15/2015 scrotal ultrasound--diffuse skin thickening and edema with bilateral hydroceles -appreciate wound care nurse recommendations Hyponatremia -This appears to be chronic 128-132 -worsening likely due to volume depletion (initially) poor solute intake and impaired Na reabsorption from renal  failure as well as medications -Improved with IV fluids-->saline lock now as pt no longer appears volume depleted -BMP in a.m. Bullous pemphigoid  -08/18/15-I spoke with the patient's dermatology office--D. Anderson, PA-C-->on prednisone approx 6 months -they stated prednisone can be weaned to bid dosing for now Peripheral vascular disease -Patient had aortogram with runoff on 02/10/2015 which revealed bilateral calcific plaques in the common femoral arteries causing approximately 80% stenosis of each common femoral artery AKI -renal ultrasound-no hydronephrosis -while initially may have been due to volume depletion, now contrast nephropathy may be primary issue -08/18/2015--serum creatinine appears to be reaching plateau -08/19/15--renal function improving Thrombocytopenia -HIV--neg -hep c antibody neg  -hepatitis B surface antigen negative -Serum B12--2075 -Check fibrinogen--499 -INR 0.90, PTT 28 -Improving with treatment of infection Coronary artery disease -08/04/2015 heart catheterization--nonobstructive CAD -Continue Lipitor, metoprolol succinate Severe malnutrition -Patient declined nutritional supplements Alcohol dependence -Patient states that he drinks 6-8 beers daily -Alcohol withdrawal protocol -No symptoms of withdrawal   Family Communication: Nephew update at beside 3/17 Disposition Plan: Home Vs. SNF in 1-2 days     Procedures/Studies: Dg Chest 2 View  08/15/2015  CLINICAL DATA:  Shortness of breath. EXAM: CHEST  2 VIEW COMPARISON:  August 03, 2015. FINDINGS: The heart size and mediastinal contours are within normal limits. Both lungs are clear. No pneumothorax or pleural effusion is noted. The visualized skeletal structures are unremarkable. IMPRESSION: No active cardiopulmonary disease. Electronically Signed   By: Marijo Conception, M.D.   On: 08/15/2015 15:42   Dg Chest 2 View  08/03/2015  CLINICAL DATA:  Chest pain starting today. Myocardial infarction in  2009. EXAM: CHEST  2 VIEW COMPARISON:  02/16/2015 FINDINGS: Emphysema. Mild atherosclerotic calcification of the aortic arch. Heart size within normal limits. Left lateral ninth and tenth rib irregularities, probably old fractures but not readily  visible on 02/09/2015 CT, correlate with any point tenderness along the left lower chest. Biapical pleural parenchymal scarring.  No pleural effusion. IMPRESSION: 1. Left lateral ninth and tenth rib irregularities probably from old fractures, correlate with any point tenderness in this vicinity in assessing for more recent fracture. 2. Emphysema. 3. Atherosclerotic aortic arch. Electronically Signed   By: Van Clines M.D.   On: 08/03/2015 16:55   US Abdomen Complete  08/16/2015  CLINICAL DATA:  Generalized abdominal pain EXAM: ABDOMEN ULTRASOUND COMPLETE COMPARISON:  None. FINDINGS: Gallbladder: No gallstones or wall thickening visualized. No sonographic Murphy sign noted by sonographer. Common bile duct: Diameter: 3.4 mm Liver: No focal lesion identified. Within normal limits in parenchymal echogenicity. IVC: No abnormality visualized. Pancreas: Visualized portion unremarkable. Spleen: Size and appearance within normal limits. Right Kidney: Length: 10.2 cm. Echogenicity within normal limits. No mass or hydronephrosis visualized. Left Kidney: Length: 9.6 cm. Echogenicity within normal limits. No mass or hydronephrosis visualized. Abdominal aorta: No aneurysm visualized. Other findings: Small right pleural effusion. IMPRESSION: 1. No cholelithiasis or sonographic evidence of acute cholecystitis. 2. No obstructive uropathy. Electronically Signed   By: Kathreen Devoid   On: 08/16/2015 16:25   US Scrotum  08/15/2015  CLINICAL DATA:  Enlarged painful, inflamed scrotum EXAM: ULTRASOUND OF SCROTUM TECHNIQUE: Complete ultrasound examination of the testicles, epididymis, and other scrotal structures was performed. COMPARISON:  None available FINDINGS: Right testicle  Measurements: 3.2 x 2.2 x 2.3 cm. No mass or microlithiasis visualized. Left testicle Measurements: 3.2 x 1.8 x 2.3 cm. No mass or microlithiasis visualized. Right epididymis: Enlarged, heterogeneous in appearance, no significant hypervascularity. Left epididymis: Small left epididymal incidental cyst measures 3 mm. Left epididymis also appears enlarged, heterogeneous and ill-defined. Hydrocele: Heterogeneous complex mixed echogenicity hydroceles present bilaterally with diffuse scrotal edema and skin thickening, difficult to exclude pyocele / infection. Varicocele:  None visualized. IMPRESSION: Diffuse scrotal skin thickening and edema with bilateral complex heterogeneous hydroceles and/or pyoceles. Difficult to exclude abscess. No testicular abnormality. Electronically Signed   By: Jerilynn Mages.  Shick M.D.   On: 08/15/2015 16:42   Ct Abdomen Pelvis W Contrast  08/15/2015  CLINICAL DATA:  Groin pain with walking and moving since 08/10/2015, swelling, dysuria, on Cipro for possible UTI, pain is burning and non radiating, no fever or chills, history prostate cancer, former smoker EXAM: CT ABDOMEN AND PELVIS WITH CONTRAST TECHNIQUE: Multidetector CT imaging of the abdomen and pelvis was performed using the standard protocol following bolus administration of intravenous contrast. Sagittal and coronal MPR images reconstructed from axial data set. CONTRAST:  161mL OMNIPAQUE IOHEXOL 300 MG/ML SOLN IV. Dilute oral contrast. COMPARISON:  CT abdomen and pelvis 02/09/2015 FINDINGS: Lung bases clear. Atrophic pancreas. Liver, gallbladder, spleen, pancreas, kidneys, and adrenal glands otherwise unremarkable. Extensive atherosclerotic calcification greatest at the iliac arteries. Coronary arterial calcifications as well. 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 Normal appendix. Unremarkable bladder and ureters. Prostate gland surgically absent. Question small BILATERAL inguinal hernias containing  fat. Scattered edema within the subcutaneous soft tissues at the inferior pelvis particularly anteriorly. Significant soft tissue swelling/ edema and question hydroceles in scrotum. Stomach and bowel loops normal appearance. No mass, adenopathy, free air, or free fluid. No acute osseous lesions. IMPRESSION: Question small BILATERAL inguinal hernias containing fat. Significant soft tissue swelling of the scrotum with question BILATERAL hydroceles. No acute intra-abdominal or intrapelvic abnormalities otherwise seen. Extensive atherosclerotic calcification as above. Electronically Signed   By: Lavonia Dana M.D.   On:  08/15/2015 17:25         Subjective: Patient feels that his supratherapeutic and scrotal pain or improving but has significant pain with standing up and ambulation. Denies any fevers, chills, chest pain, shortness breath, nausea, vomiting, diarrhea, abdominal pain. No dysuria or hematuria. No rashes.  Objective: Filed Vitals:   08/19/15 2220 08/20/15 0430 08/20/15 0950 08/20/15 1310  BP: 133/82 131/81 111/79 120/87  Pulse: 85 81 105 94  Temp:  98 F (36.7 C)  98 F (36.7 C)  TempSrc:  Oral  Oral  Resp:  18  16  Height:      Weight:      SpO2:  100%  99%    Intake/Output Summary (Last 24 hours) at 08/20/15 1845 Last data filed at 08/20/15 1842  Gross per 24 hour  Intake    960 ml  Output      0 ml  Net    960 ml   Weight change:  Exam:   General:  Pt is alert, follows commands appropriately, not in acute distress  HEENT: No icterus, No thrush, No neck mass, [place]/AT  Cardiovascular: RRR, S1/S2, no rubs, no gallops  Respiratory: CTA bilaterally, no wheezing, no crackles, no rhonchi  Abdomen: Soft/+BS, non tender, non distended, no guarding  Extremities: No edema, No lymphangitis, No petechiae, No rashes, no synovitis;Suprapubic and scrotal edema and induration without any lymphangitis or crepitance. There is mild pink discoloration of the scrotum and suprapubic  area. There is non-blanchable erythema on the left scrotum.  Data Reviewed: Basic Metabolic Panel:  Recent Labs Lab 08/16/15 0339 08/17/15 0401 08/18/15 0334 08/19/15 0420 08/20/15 0500  NA 127* 124* 128* 126* 129*  K 3.6 4.0 4.6 4.5 4.6  CL 95* 96* 99* 98* 99*  CO2 23 21* 21* 22 22  GLUCOSE 109* 109* 117* 95 106*  BUN 17 18 20  21* 23*  CREATININE 1.96* 2.83* 2.95* 2.52* 2.25*  CALCIUM 8.2* 7.9* 8.5* 8.7* 8.9   Liver Function Tests:  Recent Labs Lab 08/15/15 1912 08/17/15 0401  AST 26 14*  ALT 35 27  ALKPHOS 82 66  BILITOT 0.8 0.7  PROT 4.8* 4.4*  ALBUMIN 2.1* 1.9*   No results for input(s): LIPASE, AMYLASE in the last 168 hours. No results for input(s): AMMONIA in the last 168 hours. CBC:  Recent Labs Lab 08/15/15 1912 08/16/15 0339 08/17/15 0401 08/18/15 0334 08/19/15 0420 08/20/15 0500  WBC 8.7 7.3 9.0 12.0* 12.2* 14.1*  NEUTROABS 7.6  --   --   --   --   --   HGB 9.1* 8.4* 8.2* 8.5* 9.3* 9.8*  HCT 26.0* 23.7* 24.1* 24.5* 28.1* 29.5*  MCV 91.9 92.9 96.8 93.9 98.6 98.7  PLT 80* 74* 89* 149* 202 268   Cardiac Enzymes: No results for input(s): CKTOTAL, CKMB, CKMBINDEX, TROPONINI in the last 168 hours. BNP: Invalid input(s): POCBNP CBG:  Recent Labs Lab 08/17/15 0809  GLUCAP 90    Recent Results (from the past 240 hour(s))  Culture, blood (Routine X 2) w Reflex to ID Panel     Status: None   Collection Time: 08/15/15  7:02 PM  Result Value Ref Range Status   Specimen Description BLOOD LEFT ARM  Final   Special Requests BOTTLES DRAWN AEROBIC AND ANAEROBIC  5CC  Final   Culture   Final    NO GROWTH 5 DAYS Performed at [facility]    Report Status 08/20/2015 FINAL  Final  Culture, blood (Routine X 2) w  Reflex to ID Panel     Status: None   Collection Time: 08/15/15  7:14 PM  Result Value Ref Range Status   Specimen Description BLOOD LEFT ARM  Final   Special Requests BOTTLES DRAWN AEROBIC AND ANAEROBIC  5CC  Final   Culture   Final     NO GROWTH 5 DAYS Performed at [facility]    Report Status 08/20/2015 FINAL  Final  Urine culture     Status: None   Collection Time: 08/15/15  7:46 PM  Result Value Ref Range Status   Specimen Description URINE, CLEAN CATCH  Final   Special Requests NONE  Final   Culture   Final    MULTIPLE SPECIES PRESENT, SUGGEST RECOLLECTION Performed at [facility]    Report Status 08/17/2015 FINAL  Final     Scheduled Meds: . aspirin EC  81 mg Oral Daily  . atorvastatin  80 mg Oral q1800  . cephALEXin  500 mg Oral Q12H  . doxycycline  100 mg Oral Q12H  . folic acid  1 mg Oral Daily  . latanoprost  1 drop Both Eyes QHS  . LORazepam  1 mg Intravenous Once  . metoprolol succinate  25 mg Oral BID  . multivitamin with minerals  1 tablet Oral Daily  . pantoprazole  40 mg Oral Daily  . prednisoLONE acetate  1 drop Right Eye BID  . predniSONE  10 mg Oral BID WC  . thiamine  100 mg Oral Daily   Continuous Infusions:    Arieon Scalzo, DO  [facility] Pager [phone]  If 7PM-7AM, please contact night-coverage [email or web address] Password [email or web address] 08/20/2015, 6:45 PM   LOS: 5 days

## 2015-08-20 NOTE — Plan of Care (Signed)
Problem: Education: Goal: Verbalization of understanding the information provided will improve Outcome: Progressing Care notes re: cellulitis provided to patient. Verbalized understanding.

## 2015-08-20 NOTE — Plan of Care (Signed)
Problem: Skin Integrity: Goal: Skin integrity will improve Outcome: Progressing Xeroform dressing to scrotum applied per order and changed PRN for incontinent episodes.

## 2015-08-20 NOTE — Progress Notes (Signed)
Urology Progress Note  : weekend coverage note:  ( Dr. Risa Grill)  Subjective: 1. Scrotal cellulitis                              O: doxycycline and keflex                             A: Improved erythema today                             P: follow.                         2. Bilateral hydroceles by u/s                         3. Bullous Pemphigoid with penile lesion                         4. Hyponatremia [id number]), Cr 1.86); prednisone- induced edema ( 6 months Rx)                           5. Thrombocytopenia                         6. Peripheral vasuclar disease: 80% stenosis of bilateral common femoral arteries ( will result in decreased blood                                 Flow to penis and scrotum, etc).    No acute urologic events overnight. Ambulation:   negative Flatus:    positive Bowel movement  negative  Pain: some relief  Objective:  Blood pressure 111/79, pulse 105, temperature 98 F (36.7 C), temperature source Oral, resp. rate 18, height 5\' 8"  (1.727 m), weight 60.782 kg (134 lb), SpO2 100 %.  Physical Exam:  General:  No acute distress, awake  Genitourinary:  Improved rash.  Foley:none    I/O last 3 completed shifts: In: 1080 [P.O.:1080] Out: -   Recent Labs     08/19/15  0420  08/20/15  0500  HGB  9.3*  9.8*  WBC  12.2*  14.1*  PLT  202  268    Recent Labs     08/19/15  0420  08/20/15  0500  NA  126*  129*  K  4.5  4.6  CL  98*  99*  CO2  22  22  BUN  21*  23*  CREATININE  2.52*  2.25*  CALCIUM  8.7*  8.9  GFRNONAA  26*  29*  GFRAA  30*  34*     No results for input(s): INR, APTT in the last 72 hours.  Invalid input(s): PT   Invalid input(s): ABG  Assessment/Plan:  Continue any current medications.

## 2015-08-21 LAB — BASIC METABOLIC PANEL
Anion gap: 7 (ref 5–15)
BUN: 24 mg/dL — AB (ref 6–20)
CALCIUM: 8.8 mg/dL — AB (ref 8.9–10.3)
CHLORIDE: 101 mmol/L (ref 101–111)
CO2: 22 mmol/L (ref 22–32)
CREATININE: 2.24 mg/dL — AB (ref 0.61–1.24)
GFR calc non Af Amer: 29 mL/min — ABNORMAL LOW (ref >60)
GFR, EST AFRICAN AMERICAN: 34 mL/min — AB (ref >60)
Glucose, Bld: 118 mg/dL — ABNORMAL HIGH (ref 65–99)
Potassium: 4.2 mmol/L (ref 3.5–5.1)
SODIUM: 130 mmol/L — AB (ref 135–145)

## 2015-08-21 LAB — CBC
HEMATOCRIT: 27 % — AB (ref 39.0–52.0)
Hemoglobin: 9 g/dL — ABNORMAL LOW (ref 13.0–17.0)
MCH: 32.8 pg (ref 26.0–34.0)
MCHC: 33.3 g/dL (ref 30.0–36.0)
MCV: 98.5 fL (ref 78.0–100.0)
Platelets: 250 10*3/uL (ref 150–400)
RBC: 2.74 MIL/uL — AB (ref 4.22–5.81)
RDW: 15.1 % (ref 11.5–15.5)
WBC: 12 10*3/uL — AB (ref 4.0–10.5)

## 2015-08-21 NOTE — Progress Notes (Signed)
Physical Therapy Treatment Patient Details Name: Rodney [name] MRN: [id number] DOB: [birth year] Today's Date: 08/21/2015    History of Present Illness 65 y.o. male with a past medical history significant for CAD, prostate Ca , and peripheral vascular disease who presents 08/15/15  with groin pain, scrotal edema/cellulitis. RECENT  treatment   for presumptive UTI. In the ED he was found to have hyponatremia with sodium 123     PT Comments    The patient continues to have c/o significant pain with mobility. Did stand with 2 assist x 5 seconds today. Unable to progress  To ambulation. Patient may benefit from SNF rehab until medical issues improve.   Follow Up Recommendations  SNF;Supervision/Assistance - 24 hour     Equipment Recommendations  Rolling walker with 5" wheels    Recommendations for Other Services       Precautions / Restrictions Precautions Precautions: Fall Precaution Comments: painful and edema scrotal area    Mobility  Bed Mobility Overal bed mobility: Needs Assistance Bed Mobility: Supine to Sit;Sit to Supine     Supine to sit: Mod assist;+2 for physical assistance;+2 for safety/equipment Sit to supine: Mod assist;+2 for physical assistance;+2 for safety/equipment   General bed mobility comments: patient keeps legs apart, uses legs to scoot to edge, use of rails to self assist, able to slowly sit up on the edge. Increased pain after stand with patient laying back onto bed sideways. Assisted with the  bed pad to reposition in bed. towel placed to elevate scrotum.  Transfers Overall transfer level: Needs assistance Equipment used: Rolling walker (2 wheeled) Transfers: Sit to/from Stand Sit to Stand: Mod assist;+2 safety/equipment         General transfer comment: stood with wide base at RW. Stood for 5 seconds then c/o increased pain and sat down and lay back onto bed quickly  Ambulation/Gait             General Gait Details: unable   Chief Strategy Officer    Modified Rankin (Stroke Patients Only)       Balance Overall balance assessment: Needs assistance     Sitting balance - Comments: was able to sit up forward today in prep to stand. then quickly leans back after sitting down, support to prevent hitting  head on the rail.                            Cognition Arousal/Alertness: Awake/alert                          Exercises      General Comments        Pertinent Vitals/Pain Pain Score: 8  Pain Location: scrotum Pain Descriptors / Indicators: Discomfort;Grimacing;Guarding;Moaning Pain Intervention(s): Limited activity within patient's tolerance;Patient requesting pain meds-RN notified;Repositioned    Home Living                      Prior Function            PT Goals (current goals can now be found in the care plan section) Progress towards PT goals: Not progressing toward goals - comment (pain in  peir area remains significant, limiting mobility and standing.)    Frequency  Min 3X/week    PT Plan Discharge plan needs to be updated    Co-evaluation  End of Session Equipment Utilized During Treatment: Gait belt Activity Tolerance: Patient limited by pain Patient left: in bed;with call bell/phone within reach;with bed alarm set     Time: [id number] PT Time Calculation (min) (ACUTE ONLY): 15 min  Charges:  $Therapeutic Activity: 8-22 mins                    G Codes:      Claretha Cooper 08/21/2015, 1:55 PM Tresa Endo PT [phone]

## 2015-08-21 NOTE — Progress Notes (Signed)
PROGRESS NOTE  Rodney [name] [id number] DOB: [birth year] DOA: 08/15/2015 PCP: Simona Huh, MD  Brief History 65 y.o. male with a past medical history significant for CAD, prostate Ca s/p prostatectomy in 2010, and peripheral vascular disease who presents with groin pain. Unfortunately, the patient is a poor historian. He states that approximately 5 days prior to admission, he went to see his primary care physician because of groin pain and dysuria. The patient was treated with ciprofloxacin for presumptive UTI. His symptoms did not improve and he began having groin pain and scrotal swelling. He presented to emergency department where he was found to have hyponatremia with sodium 123 with serum creatinine 1.86. After admission, the patient was started on intravenous vancomycin with gradual improvement of his scrotal erythema and edema. He was transitioned to oral antibiotics and remained clinically stable.  Assessment/Plan: Scrotal cellulitis -A component of the patient's wound appears to be vascular in origin with non-blanchable erythema -Inciting event may have been due to the patient's underlying dermatologic condition resulting in secondary bacterial infection -his penile lesion was likely related to his pemphigoid -In addition, the patient has a degree of anasarca from his hypoalbuminemia as well as prednisone-induced edema -Discontinue Zosyn and cefepime -08/19/15--switch to po abx--doxy+cephalexin-->overall continues to improve with decrease swelling and erythema -appreciate urology input-->follow up in 2 weeks -08/15/2015 CT abdomen and pelvis negative for acute intra-abdominal or pelvic abnormalities. Soft tissue edema particularly anteriorly in the pelvis. -08/15/2015 scrotal ultrasound--diffuse skin thickening and edema with bilateral hydroceles -appreciate wound care nurse recommendations Hyponatremia -This appears to be chronic 128-132 -worsening likely due to  volume depletion (initially) poor solute intake and impaired Na reabsorption from renal failure as well as medications -Improved with IV fluids-->saline lock now as pt no longer appears volume depleted -BMP in a.m. Bullous pemphigoid  -08/18/15-I spoke with the patient's dermatology office--D. Anderson, PA-C-->on prednisone approx 6 months -they stated prednisone can be weaned to bid dosing for now Peripheral vascular disease -Patient had aortogram with runoff on 02/10/2015 which revealed bilateral calcific plaques in the common femoral arteries causing approximately 80% stenosis of each common femoral artery AKI -renal ultrasound-no hydronephrosis -while initially may have been due to volume depletion, now contrast nephropathy may be primary issue -08/18/2015--serum creatinine appears to be reaching plateau -08/19/15--renal function improving Thrombocytopenia -HIV--neg -hep c antibody neg  -hepatitis B surface antigen negative -Serum B12--2075 -Check fibrinogen--499 -INR 0.90, PTT 28 -Improving with treatment of infection Coronary artery disease -08/04/2015 heart catheterization--nonobstructive CAD -Continue Lipitor, metoprolol succinate Severe malnutrition -Patient declined nutritional supplements Alcohol dependence -Patient states that he drinks 6-8 beers daily -Alcohol withdrawal protocol -No symptoms of withdrawal   Family Communication: Nephew update at beside 3/20 Disposition Plan: SNF 3/21 if stable      Procedures/Studies: Dg Chest 2 View  08/15/2015  CLINICAL DATA:  Shortness of breath. EXAM: CHEST  2 VIEW COMPARISON:  August 03, 2015. FINDINGS: The heart size and mediastinal contours are within normal limits. Both lungs are clear. No pneumothorax or pleural effusion is noted. The visualized skeletal structures are unremarkable. IMPRESSION: No active cardiopulmonary disease. Electronically Signed   By: Marijo Conception, M.D.   On: 08/15/2015 15:42   Dg Chest 2  View  08/03/2015  CLINICAL DATA:  Chest pain starting today. Myocardial infarction in 2009. EXAM: CHEST  2 VIEW COMPARISON:  02/16/2015 FINDINGS: Emphysema. Mild atherosclerotic calcification of the aortic arch. Heart size within normal limits. Left lateral  ninth and tenth rib irregularities, probably old fractures but not readily visible on 02/09/2015 CT, correlate with any point tenderness along the left lower chest. Biapical pleural parenchymal scarring.  No pleural effusion. IMPRESSION: 1. Left lateral ninth and tenth rib irregularities probably from old fractures, correlate with any point tenderness in this vicinity in assessing for more recent fracture. 2. Emphysema. 3. Atherosclerotic aortic arch. Electronically Signed   By: Van Clines M.D.   On: 08/03/2015 16:55   US Abdomen Complete  08/16/2015  CLINICAL DATA:  Generalized abdominal pain EXAM: ABDOMEN ULTRASOUND COMPLETE COMPARISON:  None. FINDINGS: Gallbladder: No gallstones or wall thickening visualized. No sonographic Murphy sign noted by sonographer. Common bile duct: Diameter: 3.4 mm Liver: No focal lesion identified. Within normal limits in parenchymal echogenicity. IVC: No abnormality visualized. Pancreas: Visualized portion unremarkable. Spleen: Size and appearance within normal limits. Right Kidney: Length: 10.2 cm. Echogenicity within normal limits. No mass or hydronephrosis visualized. Left Kidney: Length: 9.6 cm. Echogenicity within normal limits. No mass or hydronephrosis visualized. Abdominal aorta: No aneurysm visualized. Other findings: Small right pleural effusion. IMPRESSION: 1. No cholelithiasis or sonographic evidence of acute cholecystitis. 2. No obstructive uropathy. Electronically Signed   By: Kathreen Devoid   On: 08/16/2015 16:25   US Scrotum  08/15/2015  CLINICAL DATA:  Enlarged painful, inflamed scrotum EXAM: ULTRASOUND OF SCROTUM TECHNIQUE: Complete ultrasound examination of the testicles, epididymis, and other scrotal  structures was performed. COMPARISON:  None available FINDINGS: Right testicle Measurements: 3.2 x 2.2 x 2.3 cm. No mass or microlithiasis visualized. Left testicle Measurements: 3.2 x 1.8 x 2.3 cm. No mass or microlithiasis visualized. Right epididymis: Enlarged, heterogeneous in appearance, no significant hypervascularity. Left epididymis: Small left epididymal incidental cyst measures 3 mm. Left epididymis also appears enlarged, heterogeneous and ill-defined. Hydrocele: Heterogeneous complex mixed echogenicity hydroceles present bilaterally with diffuse scrotal edema and skin thickening, difficult to exclude pyocele / infection. Varicocele:  None visualized. IMPRESSION: Diffuse scrotal skin thickening and edema with bilateral complex heterogeneous hydroceles and/or pyoceles. Difficult to exclude abscess. No testicular abnormality. Electronically Signed   By: Jerilynn Mages.  Shick M.D.   On: 08/15/2015 16:42   Ct Abdomen Pelvis W Contrast  08/15/2015  CLINICAL DATA:  Groin pain with walking and moving since 08/10/2015, swelling, dysuria, on Cipro for possible UTI, pain is burning and non radiating, no fever or chills, history prostate cancer, former smoker EXAM: CT ABDOMEN AND PELVIS WITH CONTRAST TECHNIQUE: Multidetector CT imaging of the abdomen and pelvis was performed using the standard protocol following bolus administration of intravenous contrast. Sagittal and coronal MPR images reconstructed from axial data set. CONTRAST:  177mL OMNIPAQUE IOHEXOL 300 MG/ML SOLN IV. Dilute oral contrast. COMPARISON:  CT abdomen and pelvis 02/09/2015 FINDINGS: Lung bases clear. Atrophic pancreas. Liver, gallbladder, spleen, pancreas, kidneys, and adrenal glands otherwise unremarkable. Extensive atherosclerotic calcification greatest at the iliac arteries. Coronary arterial calcifications as well. 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 Normal appendix. Unremarkable bladder and ureters. Prostate  gland surgically absent. Question small BILATERAL inguinal hernias containing fat. Scattered edema within the subcutaneous soft tissues at the inferior pelvis particularly anteriorly. Significant soft tissue swelling/ edema and question hydroceles in scrotum. Stomach and bowel loops normal appearance. No mass, adenopathy, free air, or free fluid. No acute osseous lesions. IMPRESSION: Question small BILATERAL inguinal hernias containing fat. Significant soft tissue swelling of the scrotum with question BILATERAL hydroceles. No acute intra-abdominal or intrapelvic abnormalities otherwise seen. Extensive atherosclerotic calcification as above. Electronically  Signed   By: Lavonia Dana M.D.   On: 08/15/2015 17:25         Subjective: Overall, he continues to feel better. He states that his scrotum is improving albeit still has pain. Denies any fevers, chills, chest pain, sputum, nausea, vomiting, diarrhea, abdominal pain. No dysuria or hematuria. No rashes. Is able to urinate freely.  Objective: Filed Vitals:   08/20/15 1310 08/20/15 2139 08/21/15 0452 08/21/15 1411  BP: 120/87 108/89 132/77 104/81  Pulse: 94 91 82 84  Temp: 98 F (36.7 C) 97.3 F (36.3 C) 97.7 F (36.5 C) 98 F (36.7 C)  TempSrc: Oral Oral Oral Oral  Resp: 16 16 16 16   Height:      Weight:      SpO2: 99% 99% 100% 100%    Intake/Output Summary (Last 24 hours) at 08/21/15 2005 Last data filed at 08/21/15 1411  Gross per 24 hour  Intake    240 ml  Output      0 ml  Net    240 ml   Weight change:  Exam:   General:  Pt is alert, follows commands appropriately, not in acute distress  HEENT: No icterus, No thrush, No neck mass, [place]/AT  Cardiovascular: RRR, S1/S2, no rubs, no gallops  Respiratory: CTA bilaterally, no wheezing, no crackles, no rhonchi  Abdomen: Soft/+BS, non tender, non distended, no guarding  Extremities: No edema, No lymphangitis, No petechiae, No rashes, no synovitis;Bilateral scrotal swelling  without any lymphangitis or crepitance. No purulent drainage.  Data Reviewed: Basic Metabolic Panel:  Recent Labs Lab 08/17/15 0401 08/18/15 0334 08/19/15 0420 08/20/15 0500 08/21/15 0338  NA 124* 128* 126* 129* 130*  K 4.0 4.6 4.5 4.6 4.2  CL 96* 99* 98* 99* 101  CO2 21* 21* 22 22 22   GLUCOSE 109* 117* 95 106* 118*  BUN 18 20 21* 23* 24*  CREATININE 2.83* 2.95* 2.52* 2.25* 2.24*  CALCIUM 7.9* 8.5* 8.7* 8.9 8.8*   Liver Function Tests:  Recent Labs Lab 08/15/15 1912 08/17/15 0401  AST 26 14*  ALT 35 27  ALKPHOS 82 66  BILITOT 0.8 0.7  PROT 4.8* 4.4*  ALBUMIN 2.1* 1.9*   No results for input(s): LIPASE, AMYLASE in the last 168 hours. No results for input(s): AMMONIA in the last 168 hours. CBC:  Recent Labs Lab 08/15/15 1912  08/17/15 0401 08/18/15 0334 08/19/15 0420 08/20/15 0500 08/21/15 0338  WBC 8.7  < > 9.0 12.0* 12.2* 14.1* 12.0*  NEUTROABS 7.6  --   --   --   --   --   --   HGB 9.1*  < > 8.2* 8.5* 9.3* 9.8* 9.0*  HCT 26.0*  < > 24.1* 24.5* 28.1* 29.5* 27.0*  MCV 91.9  < > 96.8 93.9 98.6 98.7 98.5  PLT 80*  < > 89* 149* 202 268 250  < > = values in this interval not displayed. Cardiac Enzymes: No results for input(s): CKTOTAL, CKMB, CKMBINDEX, TROPONINI in the last 168 hours. BNP: Invalid input(s): POCBNP CBG:  Recent Labs Lab 08/17/15 0809  GLUCAP 90    Recent Results (from the past 240 hour(s))  Culture, blood (Routine X 2) w Reflex to ID Panel     Status: None   Collection Time: 08/15/15  7:02 PM  Result Value Ref Range Status   Specimen Description BLOOD LEFT ARM  Final   Special Requests BOTTLES DRAWN AEROBIC AND ANAEROBIC  5CC  Final   Culture   Final  NO GROWTH 5 DAYS Performed at [facility]    Report Status 08/20/2015 FINAL  Final  Culture, blood (Routine X 2) w Reflex to ID Panel     Status: None   Collection Time: 08/15/15  7:14 PM  Result Value Ref Range Status   Specimen Description BLOOD LEFT ARM  Final    Special Requests BOTTLES DRAWN AEROBIC AND ANAEROBIC  5CC  Final   Culture   Final    NO GROWTH 5 DAYS Performed at [facility]    Report Status 08/20/2015 FINAL  Final  Urine culture     Status: None   Collection Time: 08/15/15  7:46 PM  Result Value Ref Range Status   Specimen Description URINE, CLEAN CATCH  Final   Special Requests NONE  Final   Culture   Final    MULTIPLE SPECIES PRESENT, SUGGEST RECOLLECTION Performed at [facility]    Report Status 08/17/2015 FINAL  Final     Scheduled Meds: . aspirin EC  81 mg Oral Daily  . atorvastatin  80 mg Oral q1800  . cephALEXin  500 mg Oral Q12H  . doxycycline  100 mg Oral Q12H  . folic acid  1 mg Oral Daily  . latanoprost  1 drop Both Eyes QHS  . LORazepam  1 mg Intravenous Once  . metoprolol succinate  25 mg Oral BID  . multivitamin with minerals  1 tablet Oral Daily  . pantoprazole  40 mg Oral Daily  . prednisoLONE acetate  1 drop Right Eye BID  . predniSONE  10 mg Oral BID WC  . thiamine  100 mg Oral Daily   Continuous Infusions:    Aseret Hoffman, DO  [facility] Pager [phone]  If 7PM-7AM, please contact night-coverage [email or web address] Password [email or web address] 08/21/2015, 8:05 PM   LOS: 6 days

## 2015-08-21 NOTE — Clinical Social Work Note (Signed)
Clinical Social Work Assessment  Patient Details  Name: Rodney [name] MRN: [id number] Date of Birth: May 23, 1951  Date of referral:  08/21/15               Reason for consult:  Discharge Planning                Permission sought to share information with:  Family Supports Permission granted to share information::  Yes, Verbal Permission Granted  Name::     Vivien Rota -sister  Agency::     Relationship::  sister  Contact Information:     Housing/Transportation Living arrangements for the past 2 months:  Single Family Home Source of Information:  Patient Patient Interpreter Needed:  None Criminal Activity/Legal Involvement Pertinent to Current Situation/Hospitalization:  No - Comment as needed Significant Relationships:  Siblings Lives with:  Self Do you feel safe going back to the place where you live?  No Need for family participation in patient care:  No (Coment) (pt called sister while in room)  Care giving concerns:  Pt lives alone. Pt needing increased assistance. PT recommending SNF.    Social Worker assessment / plan:    CSW met with pt at bedside re: discharge plans. CSW discussed recommendation for rehab at [facility]. Pt aware and agreeable. Pt called pt sister while CSW in room because pt sister familiar with rehab facilities. Pt sister states preference would be [facility]. Pt is agreeable to [place] SNF search.   CSW completed FL2 and initiated SNF search in [place] and [place] left message with [facility] to notify of pt interest.   CSW to follow up with pt re: SNF bed offers.   CSW to continue to follow to provide support and assist with pt discharge planning needs.   Employment status:  Retired Nurse, adult PT Recommendations:  [place] / Referral to community resources:  [place]  Patient/Family's Response to care:  Pt alert and oriented x 4. Pt agreeable to rehab at  SNF. Pt eager for discharge.   Patient/Family's Understanding of and Emotional Response to Diagnosis, Current Treatment, and Prognosis:  Pt expressed understanding surrounding diagnosis and treatment plan and agreeable to recommendation for rehab at SNF.   Emotional Assessment Appearance:  Appears stated age Attitude/Demeanor/Rapport:  Other (pt appropriate) Affect (typically observed):  Pleasant Orientation:  Oriented to Self, Oriented to Place, Oriented to  Time, Oriented to Situation Alcohol / Substance use:  Not Applicable Psych involvement (Current and /or in the community):  No (Comment)  Discharge Needs  Concerns to be addressed:  Discharge Planning Concerns Readmission within the last 30 days:  No Current discharge risk:  Physical Impairment, Lives alone Barriers to Discharge:  Continued Medical Work up   Ladell Pier, LCSW 08/21/2015, 4:45 PM  [phone]

## 2015-08-21 NOTE — [place] FL2 (Signed)
[place] MEDICAID FL2 LEVEL OF CARE SCREENING TOOL     IDENTIFICATION  Patient Name: Rodney [name] Birthdate: [birth year] Sex: male Admission Date (Current Location): 08/15/2015  [facility] and [place] Number:  [place] and Address:  [facility],  [place] [address] [place]      Provider Number: [id number]  Attending Physician Name and Address:  Orson Eva, MD  Relative Name and Phone Number:       Current Level of Care: Hospital Recommended Level of Care: [place] Prior Approval Number:    Date Approved/Denied:   PASRR Number: [id number]  Discharge Plan: SNF    Current Diagnoses: Patient Active Problem List   Diagnosis Date Noted  . Cellulitis, scrotum 08/16/2015  . Scrotal swelling   . Cellulitis of scrotum 08/15/2015  . Hyponatremia 08/15/2015  . AKI (acute kidney injury) ([place]) 08/15/2015  . Protein calorie malnutrition ([place]) 08/15/2015  . Thrombocytopenia ([place]) 08/15/2015  . Anemia, normocytic normochromic 08/15/2015  . Scrotal abscess 08/15/2015  . Acute coronary syndrome ([place]) 08/03/2015  . Atherosclerosis of native arteries of extremity with rest pain ([place]) 02/16/2015  . PAD (peripheral artery disease) ([place]) 02/16/2015  . Ischemic leg 02/09/2015  . Dysuria 08/09/2011  . Prostate cancer ([place]) 05/03/2009    Orientation RESPIRATION BLADDER Height & Weight     Self, Time, Situation, Place  Normal Incontinent Weight: 134 lb (60.782 kg) Height:  5\' 8"  (172.7 cm)  BEHAVIORAL SYMPTOMS/MOOD NEUROLOGICAL BOWEL NUTRITION STATUS   (no behaviors)  (NONE) Continent Diet (Diet Regular)  AMBULATORY STATUS COMMUNICATION OF NEEDS Skin    (+2 for safety/equipment) Verbally Other (Comment) (wound to penis and scrotum treatment Apply xeroform to scrotum and penis and change Q day)                       Personal Care Assistance Level of Assistance  Bathing, Feeding, Dressing Bathing Assistance: Maximum  assistance Feeding assistance: Independent Dressing Assistance: Maximum assistance     Functional Limitations Info  Sight, Hearing, Speech Sight Info: Adequate Hearing Info: Adequate Speech Info: Adequate    SPECIAL CARE FACTORS FREQUENCY  PT (By licensed PT), OT (By licensed OT)     PT Frequency: 5 x a week OT Frequency: 5 x a week            Contractures Contractures Info: Not present    Additional Factors Info  Code Status, Allergies Code Status Info: FULL code status Allergies Info: Other: polyestor and metals except titanium           Current Medications (08/21/2015):  This is the current hospital active medication list Current Facility-Administered Medications  Medication Dose Route Frequency Provider Last Rate Last Dose  . acetaminophen (TYLENOL) tablet 650 mg  650 mg Oral Q6H PRN Edwin Dada, MD       Or  . acetaminophen (TYLENOL) suppository 650 mg  650 mg Rectal Q6H PRN Edwin Dada, MD      . aspirin EC tablet 81 mg  81 mg Oral Daily Edwin Dada, MD   81 mg at 08/21/15 0949  . atorvastatin (LIPITOR) tablet 80 mg  80 mg Oral q1800 Edwin Dada, MD   80 mg at 08/20/15 1733  . cephALEXin (KEFLEX) capsule 500 mg  500 mg Oral Q12H Orson Eva, MD   500 mg at 08/21/15 0949  . doxycycline (VIBRA-TABS) tablet 100 mg  100 mg Oral Q12H Orson Eva, MD  100 mg at 08/21/15 0949  . folic acid (FOLVITE) tablet 1 mg  1 mg Oral Daily Rhetta Mura Schorr, NP   1 mg at 08/21/15 0949  . HYDROcodone-acetaminophen (NORCO/VICODIN) 5-325 MG per tablet 1-2 tablet  1-2 tablet Oral Q4H PRN Edwin Dada, MD   2 tablet at 08/21/15 1407  . latanoprost (XALATAN) 0.005 % ophthalmic solution 1 drop  1 drop Both Eyes QHS Edwin Dada, MD   1 drop at 08/20/15 2150  . LORazepam (ATIVAN) injection 1 mg  1 mg Intravenous Once Jeryl Columbia, NP      . metoprolol succinate (TOPROL-XL) 24 hr tablet 25 mg  25 mg Oral BID Edwin Dada,  MD   25 mg at 08/21/15 0949  . multivitamin with minerals tablet 1 tablet  1 tablet Oral Daily Rhetta Mura Schorr, NP   1 tablet at 08/21/15 0949  . pantoprazole (PROTONIX) EC tablet 40 mg  40 mg Oral Daily Edwin Dada, MD   40 mg at 08/21/15 0949  . polyvinyl alcohol (LIQUIFILM TEARS) 1.4 % ophthalmic solution 1 drop  1 drop Both Eyes PRN Orson Eva, MD   1 drop at 08/17/15 1532  . prednisoLONE acetate (PRED FORTE) 1 % ophthalmic suspension 1 drop  1 drop Right Eye BID Minda Ditto, RPH   1 drop at 08/21/15 0950  . predniSONE (DELTASONE) tablet 10 mg  10 mg Oral BID WC Orson Eva, MD   10 mg at 08/21/15 0756  . thiamine (VITAMIN B-1) tablet 100 mg  100 mg Oral Daily Rhetta Mura Schorr, NP   100 mg at 08/21/15 [id number]     Discharge Medications: Please see discharge summary for a list of discharge medications.  Relevant Imaging Results:  Relevant Lab Results:   Additional Information SSN: [id number]  Vernida Mcnicholas, [place] A, LCSW

## 2015-08-21 NOTE — Clinical Social Work Placement (Signed)
   CLINICAL SOCIAL WORK PLACEMENT  NOTE  Date:  08/21/2015  Patient Details  Name: Rodney [name] MRN: [id number] Date of Birth: February 12, 1951  Clinical Social Work is seeking post-discharge placement for this patient at the [place] level of care (*CSW will initial, date and re-position this form in  chart as items are completed):  Yes   Patient/family provided with [place] Work Department's list of facilities offering this level of care within the geographic area requested by the patient (or if unable, by the patient's family).  Yes   Patient/family informed of their freedom to choose among providers that offer the needed level of care, that participate in Medicare, Medicaid or managed care program needed by the patient, have an available bed and are willing to accept the patient.  Yes   Patient/family informed of [facility]'s ownership interest in [facility] and [facility], as well as of the fact that they are under no obligation to receive care at these facilities.  PASRR submitted to EDS on 08/21/15     PASRR number received on 08/21/15     Existing PASRR number confirmed on       FL2 transmitted to all facilities in geographic area requested by pt/family on 08/21/15     FL2 transmitted to all facilities within larger geographic area on       Patient informed that his/her managed care company has contracts with or will negotiate with certain facilities, including the following:            Patient/family informed of bed offers received.  Patient chooses bed at       Physician recommends and patient chooses bed at      Patient to be transferred to   on  .  Patient to be transferred to facility by       Patient family notified on   of transfer.  Name of family member notified:        PHYSICIAN Please sign FL2     Additional Comment:    _______________________________________________ Ladell Pier, LCSW 08/21/2015, 3:53  PM

## 2015-08-22 LAB — BASIC METABOLIC PANEL
ANION GAP: 10 (ref 5–15)
BUN: 26 mg/dL — ABNORMAL HIGH (ref 6–20)
CALCIUM: 8.7 mg/dL — AB (ref 8.9–10.3)
CO2: 21 mmol/L — ABNORMAL LOW (ref 22–32)
Chloride: 100 mmol/L — ABNORMAL LOW (ref 101–111)
Creatinine, Ser: 2.26 mg/dL — ABNORMAL HIGH (ref 0.61–1.24)
GFR, EST AFRICAN AMERICAN: 34 mL/min — AB (ref >60)
GFR, EST NON AFRICAN AMERICAN: 29 mL/min — AB (ref >60)
GLUCOSE: 114 mg/dL — AB (ref 65–99)
Potassium: 4.1 mmol/L (ref 3.5–5.1)
SODIUM: 131 mmol/L — AB (ref 135–145)

## 2015-08-22 MED ORDER — DOXYCYCLINE HYCLATE 100 MG PO TABS: 100.0000 mg | ORAL_TABLET | Freq: Two times a day (BID) | ORAL | Status: DC

## 2015-08-22 MED ORDER — CEPHALEXIN 500 MG PO CAPS: 500.0000 mg | ORAL_CAPSULE | Freq: Two times a day (BID) | ORAL | Status: DC

## 2015-08-22 MED ORDER — PREDNISONE 10 MG PO TABS: 10.0000 mg | ORAL_TABLET | Freq: Two times a day (BID) | ORAL | Status: DC

## 2015-08-22 MED ORDER — HYDROCODONE-ACETAMINOPHEN 5-325 MG PO TABS: 1.0000 | ORAL_TABLET | ORAL | Status: DC | PRN

## 2015-08-22 NOTE — Discharge Summary (Signed)
Physician Discharge Summary  Rodney [name] [id number] DOB: 1951-01-01 DOA: 08/15/2015  PCP: Simona Huh, MD  Admit date: 08/15/2015 Discharge date: 08/22/2015  Recommendations for Outpatient Follow-up:  1. Pt will need to follow up with PCP in 2 weeks post discharge 2. Please obtain BMP and CBC in one week  Discharge Diagnoses:  Scrotal cellulitis -A component of the patient's wound appears to be vascular in origin with non-blanchable erythema -Inciting event may have been due to the patient's underlying dermatologic condition resulting in secondary bacterial infection -his penile lesion was likely related to his pemphigoid -In addition, the patient has a degree of anasarca from his hypoalbuminemia as well as prednisone-induced edema -Discontinue Zosyn and cefepime -08/19/15--switch to po abx--doxy+cephalexin-->overall continues to improve with decrease swelling and erythema -appreciate urology input-->follow up in 2 weeks -08/15/2015 CT abdomen and pelvis negative for acute intra-abdominal or pelvic abnormalities. Soft tissue edema particularly anteriorly in the pelvis. -08/15/2015 scrotal ultrasound--diffuse skin thickening and edema with bilateral hydroceles -appreciate wound care nurse recommendations--> Xeroform gauze to scrotum and penile areas to promote healing -Although the patient did have symptoms of waxing and waning edema of his bilateral scrotum, the erythema and pain has gradually decreased. In addition, the edema of his penis and suprapubic areas continued to improve. -he will be discharged with 4 additional days of doxycycline and cephalexin to complete 10 days of therapy -The patient was encouraged to increase activity and elevate his scrotum 1 at rest Hyponatremia -This appears to be chronic 128-132 -worsening likely due to volume depletion (initially) poor solute intake and impaired Na reabsorption from renal failure as well as medications -Improved with IV  fluids-->saline lock now as pt no longer appears volume depleted -Sodium 131 on the day of discharge Bullous pemphigoid  -08/18/15-I spoke with the patient's dermatology office--D. Anderson, PA-C-->on prednisone approx 6 months -they stated prednisone can be weaned to bid dosing for now until he follows up in the office Peripheral vascular disease -Patient had aortogram with runoff on 02/10/2015 which revealed bilateral calcific plaques in the common femoral arteries causing approximately 80% stenosis of each common femoral artery AKI -renal ultrasound-no hydronephrosis -while initially may have been due to volume depletion, now contrast nephropathy may be primary issue -08/18/2015--serum creatinine appears to be reaching plateau -08/19/15--renal function improving -Serum creatinine 2.26 on the day of discharge -We'll not restart Altace Thrombocytopenia -HIV--neg -hep c antibody neg  -hepatitis B surface antigen negative -Serum B12--2075 -Check fibrinogen--499 -INR 0.90, PTT 28 -Improving with treatment of infection Coronary artery disease -08/04/2015 heart catheterization--nonobstructive CAD -Continue Lipitor, metoprolol succinate Severe malnutrition -Patient declined nutritional supplements Alcohol dependence -Patient states that he drinks 6-8 beers daily -Alcohol withdrawal protocol -No symptoms of withdrawal  Discharge Condition: stable  Disposition: SNF Follow-up Information    Follow up with GRAPEY,Koreen Lizaola S, MD. Schedule an appointment as soon as possible for a visit in 3 weeks.   Specialty:  Urology   Contact information:   [place] [address] [phone]       Follow up with [facility] .   Specialty:  Skilled Nursing Facility   Contact information:   [address] [phone]      Diet:cardiac Wt Readings from Last 3 Encounters:  08/15/15 60.782 kg (134 lb)  08/04/15 58.1 kg (128 lb 1.4 oz)    03/08/15 60.056 kg (132 lb 6.4 oz)    History of present illness:  65 y.o. male with a past medical  history significant for CAD, prostate Ca s/p prostatectomy in 2010, and peripheral vascular disease who presents with groin pain. Unfortunately, the patient is a poor historian. He states that approximately 5 days prior to admission, he went to see his primary care physician because of groin pain and dysuria. The patient was treated with ciprofloxacin for presumptive UTI. His symptoms did not improve and he began having groin pain and scrotal swelling. He presented to emergency department where he was found to have hyponatremia with sodium 123 with serum creatinine 1.86. After admission, the patient was started on intravenous vancomycin with gradual improvement of his scrotal erythema and edema. He was transitioned to oral antibiotics and remained clinically stable. Although the patient continued to have waxing and waning edema of his bilateral scrotum which likely represented hydroceles, the edema and erythema of the suprapubic area and penis gradually improved throughout the hospitalization. There is no warmth, purulent drainage, or necrosis. He will need continued elevation of the scrotum at rest. He was encouraged to increase activity.  Consultants: [facility]  Discharge Exam: Filed Vitals:   08/21/15 2141 08/22/15 0458  BP: 114/67 140/76  Pulse: 86 91  Temp: 97.6 F (36.4 C) 97.4 F (36.3 C)  Resp: 16 16   Filed Vitals:   08/21/15 0452 08/21/15 1411 08/21/15 2141 08/22/15 0458  BP: 132/77 104/81 114/67 140/76  Pulse: 82 84 86 91  Temp: 97.7 F (36.5 C) 98 F (36.7 C) 97.6 F (36.4 C) 97.4 F (36.3 C)  TempSrc: Oral Oral Oral Oral  Resp: 16 16 16 16   Height:      Weight:      SpO2: 100% 100% 100% 100%   General: A&O x 3, NAD, pleasant, cooperative Cardiovascular: RRR, no rub, no gallop, no S3 Respiratory: Diminished breath sounds but with auscultation.  Abdomen:soft,  nontender, nondistended, positive bowel sounds Extremities: No edema, No lymphangitis, no petechiae  Discharge Instructions      Discharge Instructions    Diet - low sodium heart healthy    Complete by:  As directed      Increase activity slowly    Complete by:  As directed             Medication List    STOP taking these medications        ramipril 5 MG capsule  Commonly known as:  ALTACE      TAKE these medications        aspirin 81 MG EC tablet  Take 1 tablet (81 mg total) by mouth daily.     atorvastatin 80 MG tablet  Commonly known as:  LIPITOR  Take 1 tablet (80 mg total) by mouth daily at 6 PM.     cephALEXin 500 MG capsule  Commonly known as:  KEFLEX  Take 1 capsule (500 mg total) by mouth every 12 (twelve) hours.     doxycycline 100 MG tablet  Commonly known as:  VIBRA-TABS  Take 1 tablet (100 mg total) by mouth every 12 (twelve) hours.     DUREZOL 0.05 % Emul  Generic drug:  Difluprednate  Place 1 drop into the right eye 2 (two) times daily.     HYDROcodone-acetaminophen 5-325 MG tablet  Commonly known as:  NORCO/VICODIN  Take 1-2 tablets by mouth every 4 (four) hours as needed for moderate pain.     latanoprost 0.005 % ophthalmic solution  Commonly known as:  XALATAN  Place 1 drop into both eyes at bedtime.     metoprolol  succinate 25 MG 24 hr tablet  Commonly known as:  TOPROL-XL  Take 25 mg by mouth 2 (two) times daily.     nitroGLYCERIN 0.4 MG SL tablet  Commonly known as:  NITROSTAT  Place 1 tablet (0.4 mg total) under the tongue every 5 (five) minutes x 3 doses as needed for chest pain.     pantoprazole 40 MG tablet  Commonly known as:  PROTONIX  Take 1 tablet (40 mg total) by mouth daily.     predniSONE 10 MG tablet  Commonly known as:  DELTASONE  Take 1 tablet (10 mg total) by mouth 2 (two) times daily with a meal.         The results of significant diagnostics from this hospitalization (including imaging, microbiology,  ancillary and laboratory) are listed below for reference.    Significant Diagnostic Studies: Dg Chest 2 View  08/15/2015  CLINICAL DATA:  Shortness of breath. EXAM: CHEST  2 VIEW COMPARISON:  August 03, 2015. FINDINGS: The heart size and mediastinal contours are within normal limits. Both lungs are clear. No pneumothorax or pleural effusion is noted. The visualized skeletal structures are unremarkable. IMPRESSION: No active cardiopulmonary disease. Electronically Signed   By: Marijo Conception, M.D.   On: 08/15/2015 15:42   Dg Chest 2 View  08/03/2015  CLINICAL DATA:  Chest pain starting today. Myocardial infarction in 2009. EXAM: CHEST  2 VIEW COMPARISON:  02/16/2015 FINDINGS: Emphysema. Mild atherosclerotic calcification of the aortic arch. Heart size within normal limits. Left lateral ninth and tenth rib irregularities, probably old fractures but not readily visible on 02/09/2015 CT, correlate with any point tenderness along the left lower chest. Biapical pleural parenchymal scarring.  No pleural effusion. IMPRESSION: 1. Left lateral ninth and tenth rib irregularities probably from old fractures, correlate with any point tenderness in this vicinity in assessing for more recent fracture. 2. Emphysema. 3. Atherosclerotic aortic arch. Electronically Signed   By: Van Clines M.D.   On: 08/03/2015 16:55   US Abdomen Complete  08/16/2015  CLINICAL DATA:  Generalized abdominal pain EXAM: ABDOMEN ULTRASOUND COMPLETE COMPARISON:  None. FINDINGS: Gallbladder: No gallstones or wall thickening visualized. No sonographic Murphy sign noted by sonographer. Common bile duct: Diameter: 3.4 mm Liver: No focal lesion identified. Within normal limits in parenchymal echogenicity. IVC: No abnormality visualized. Pancreas: Visualized portion unremarkable. Spleen: Size and appearance within normal limits. Right Kidney: Length: 10.2 cm. Echogenicity within normal limits. No mass or hydronephrosis visualized. Left Kidney:  Length: 9.6 cm. Echogenicity within normal limits. No mass or hydronephrosis visualized. Abdominal aorta: No aneurysm visualized. Other findings: Small right pleural effusion. IMPRESSION: 1. No cholelithiasis or sonographic evidence of acute cholecystitis. 2. No obstructive uropathy. Electronically Signed   By: Kathreen Devoid   On: 08/16/2015 16:25   US Scrotum  08/15/2015  CLINICAL DATA:  Enlarged painful, inflamed scrotum EXAM: ULTRASOUND OF SCROTUM TECHNIQUE: Complete ultrasound examination of the testicles, epididymis, and other scrotal structures was performed. COMPARISON:  None available FINDINGS: Right testicle Measurements: 3.2 x 2.2 x 2.3 cm. No mass or microlithiasis visualized. Left testicle Measurements: 3.2 x 1.8 x 2.3 cm. No mass or microlithiasis visualized. Right epididymis: Enlarged, heterogeneous in appearance, no significant hypervascularity. Left epididymis: Small left epididymal incidental cyst measures 3 mm. Left epididymis also appears enlarged, heterogeneous and ill-defined. Hydrocele: Heterogeneous complex mixed echogenicity hydroceles present bilaterally with diffuse scrotal edema and skin thickening, difficult to exclude pyocele / infection. Varicocele:  None visualized. IMPRESSION: Diffuse scrotal skin thickening and  edema with bilateral complex heterogeneous hydroceles and/or pyoceles. Difficult to exclude abscess. No testicular abnormality. Electronically Signed   By: Jerilynn Mages.  Shick M.D.   On: 08/15/2015 16:42   Ct Abdomen Pelvis W Contrast  08/15/2015  CLINICAL DATA:  Groin pain with walking and moving since 08/10/2015, swelling, dysuria, on Cipro for possible UTI, pain is burning and non radiating, no fever or chills, history prostate cancer, former smoker EXAM: CT ABDOMEN AND PELVIS WITH CONTRAST TECHNIQUE: Multidetector CT imaging of the abdomen and pelvis was performed using the standard protocol following bolus administration of intravenous contrast. Sagittal and coronal MPR images  reconstructed from axial data set. CONTRAST:  144mL OMNIPAQUE IOHEXOL 300 MG/ML SOLN IV. Dilute oral contrast. COMPARISON:  CT abdomen and pelvis 02/09/2015 FINDINGS: Lung bases clear. Atrophic pancreas. Liver, gallbladder, spleen, pancreas, kidneys, and adrenal glands otherwise unremarkable. Extensive atherosclerotic calcification greatest at the iliac arteries. Coronary arterial calcifications as well. 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 Normal appendix. Unremarkable bladder and ureters. Prostate gland surgically absent. Question small BILATERAL inguinal hernias containing fat. Scattered edema within the subcutaneous soft tissues at the inferior pelvis particularly anteriorly. Significant soft tissue swelling/ edema and question hydroceles in scrotum. Stomach and bowel loops normal appearance. No mass, adenopathy, free air, or free fluid. No acute osseous lesions. IMPRESSION: Question small BILATERAL inguinal hernias containing fat. Significant soft tissue swelling of the scrotum with question BILATERAL hydroceles. No acute intra-abdominal or intrapelvic abnormalities otherwise seen. Extensive atherosclerotic calcification as above. Electronically Signed   By: Lavonia Dana M.D.   On: 08/15/2015 17:25     Microbiology: Recent Results (from the past 240 hour(s))  Culture, blood (Routine X 2) w Reflex to ID Panel     Status: None   Collection Time: 08/15/15  7:02 PM  Result Value Ref Range Status   Specimen Description BLOOD LEFT ARM  Final   Special Requests BOTTLES DRAWN AEROBIC AND ANAEROBIC  5CC  Final   Culture   Final    NO GROWTH 5 DAYS Performed at [facility]    Report Status 08/20/2015 FINAL  Final  Culture, blood (Routine X 2) w Reflex to ID Panel     Status: None   Collection Time: 08/15/15  7:14 PM  Result Value Ref Range Status   Specimen Description BLOOD LEFT ARM  Final   Special Requests BOTTLES DRAWN AEROBIC AND ANAEROBIC  5CC   Final   Culture   Final    NO GROWTH 5 DAYS Performed at [facility]    Report Status 08/20/2015 FINAL  Final  Urine culture     Status: None   Collection Time: 08/15/15  7:46 PM  Result Value Ref Range Status   Specimen Description URINE, CLEAN CATCH  Final   Special Requests NONE  Final   Culture   Final    MULTIPLE SPECIES PRESENT, SUGGEST RECOLLECTION Performed at [facility]    Report Status 08/17/2015 FINAL  Final     Labs: Basic Metabolic Panel:  Recent Labs Lab 08/18/15 0334 08/19/15 0420 08/20/15 0500 08/21/15 0338 08/22/15 0352  NA 128* 126* 129* 130* 131*  K 4.6 4.5 4.6 4.2 4.1  CL 99* 98* 99* 101 100*  CO2 21* 22 22 22  21*  GLUCOSE 117* 95 106* 118* 114*  BUN 20 21* 23* 24* 26*  CREATININE 2.95* 2.52* 2.25* 2.24* 2.26*  CALCIUM 8.5* 8.7* 8.9 8.8* 8.7*   Liver Function Tests:  Recent  Labs Lab 08/15/15 1912 08/17/15 0401  AST 26 14*  ALT 35 27  ALKPHOS 82 66  BILITOT 0.8 0.7  PROT 4.8* 4.4*  ALBUMIN 2.1* 1.9*   No results for input(s): LIPASE, AMYLASE in the last 168 hours. No results for input(s): AMMONIA in the last 168 hours. CBC:  Recent Labs Lab 08/15/15 1912  08/17/15 0401 08/18/15 0334 08/19/15 0420 08/20/15 0500 08/21/15 0338  WBC 8.7  < > 9.0 12.0* 12.2* 14.1* 12.0*  NEUTROABS 7.6  --   --   --   --   --   --   HGB 9.1*  < > 8.2* 8.5* 9.3* 9.8* 9.0*  HCT 26.0*  < > 24.1* 24.5* 28.1* 29.5* 27.0*  MCV 91.9  < > 96.8 93.9 98.6 98.7 98.5  PLT 80*  < > 89* 149* 202 268 250  < > = values in this interval not displayed. Cardiac Enzymes: No results for input(s): CKTOTAL, CKMB, CKMBINDEX, TROPONINI in the last 168 hours. BNP: Invalid input(s): POCBNP CBG:  Recent Labs Lab 08/17/15 0809  GLUCAP 90    Time coordinating discharge:  Greater than 30 minutes  Signed:  Korra Christine, DO [facility] Pager: [phone] 08/22/2015, 2:15 PM

## 2015-08-22 NOTE — Progress Notes (Signed)
Pt for discharge to [facility].   CSW facilitated pt discharge needs including contacting facility, faxing pt discharge summary via epic hub, discussing with pt at bedside, providing RN phone number to call report, and arranging ambulance transport for pt to [facility].   Pt coping appropriately with transition to rehab and eager to discharge.   No further social work needs identified at this time.   CSW signing off.   Alison Murray, MSW, [place] Work [phone]

## 2015-08-22 NOTE — Progress Notes (Signed)
CSW continuing to follow.   CSW followed up with pt at bedside to provide SNF bed offers.   Pt chooses bed at [facility].   CSW confirmed with [facility] that facility could accept pt today.  CSW to facilitate pt discharge needs today.  Alison Murray, MSW, [place] Work [phone]

## 2015-08-22 NOTE — Clinical Social Work Placement (Signed)
   CLINICAL SOCIAL WORK PLACEMENT  NOTE  Date:  08/22/2015  Patient Details  Name: Rodney [name] MRN: [id number] Date of Birth: Jan 05, 1951  Clinical Social Work is seeking post-discharge placement for this patient at the [place] level of care (*CSW will initial, date and re-position this form in  chart as items are completed):  Yes   Patient/family provided with [place] Work Department's list of facilities offering this level of care within the geographic area requested by the patient (or if unable, by the patient's family).  Yes   Patient/family informed of their freedom to choose among providers that offer the needed level of care, that participate in Medicare, Medicaid or managed care program needed by the patient, have an available bed and are willing to accept the patient.  Yes   Patient/family informed of [facility]'s ownership interest in [facility] and [facility], as well as of the fact that they are under no obligation to receive care at these facilities.  PASRR submitted to EDS on 08/21/15     PASRR number received on 08/21/15     Existing PASRR number confirmed on       FL2 transmitted to all facilities in geographic area requested by pt/family on 08/21/15     FL2 transmitted to all facilities within larger geographic area on       Patient informed that his/her managed care company has contracts with or will negotiate with certain facilities, including the following:        Yes   Patient/family informed of bed offers received.  Patient chooses bed at [facility]     Physician recommends and patient chooses bed at      Patient to be transferred to [facility] on 08/22/15.  Patient to be transferred to facility by ambulance [facility])     Patient family notified on 08/22/15 of transfer.  Name of family member notified:  pt notified at bedside, pt stated that he will notify pt sister when he arrives to [facility].      PHYSICIAN Please sign FL2     Additional Comment:    _______________________________________________ Ladell Pier, LCSW 08/22/2015, 3:05 PM

## 2015-08-22 NOTE — Progress Notes (Signed)
Patient was just transported at [place] via [place] by [place] to [facility].  Patient had no complaints and patient's condition was stable. Assessment from before unchanged.Azzie Glatter [place]

## 2015-08-23 ENCOUNTER — Non-Acute Institutional Stay (SKILLED_NURSING_FACILITY): Payer: 59 | Admitting: Adult Health

## 2015-08-23 ENCOUNTER — Encounter: Payer: Self-pay | Admitting: Adult Health

## 2015-08-23 DIAGNOSIS — R531 Weakness: Secondary | ICD-10-CM

## 2015-08-23 DIAGNOSIS — I70229 Atherosclerosis of native arteries of extremities with rest pain, unspecified extremity: Secondary | ICD-10-CM | POA: Diagnosis not present

## 2015-08-23 DIAGNOSIS — D72829 Elevated white blood cell count, unspecified: Secondary | ICD-10-CM

## 2015-08-23 DIAGNOSIS — E785 Hyperlipidemia, unspecified: Secondary | ICD-10-CM

## 2015-08-23 DIAGNOSIS — E43 Unspecified severe protein-calorie malnutrition: Secondary | ICD-10-CM

## 2015-08-23 DIAGNOSIS — K219 Gastro-esophageal reflux disease without esophagitis: Secondary | ICD-10-CM

## 2015-08-23 DIAGNOSIS — N492 Inflammatory disorders of scrotum: Secondary | ICD-10-CM | POA: Diagnosis not present

## 2015-08-23 DIAGNOSIS — F1020 Alcohol dependence, uncomplicated: Secondary | ICD-10-CM | POA: Diagnosis not present

## 2015-08-23 DIAGNOSIS — E871 Hypo-osmolality and hyponatremia: Secondary | ICD-10-CM

## 2015-08-23 DIAGNOSIS — N179 Acute kidney failure, unspecified: Secondary | ICD-10-CM | POA: Diagnosis not present

## 2015-08-23 DIAGNOSIS — I10 Essential (primary) hypertension: Secondary | ICD-10-CM

## 2015-08-23 DIAGNOSIS — D638 Anemia in other chronic diseases classified elsewhere: Secondary | ICD-10-CM

## 2015-08-23 DIAGNOSIS — L120 Bullous pemphigoid: Secondary | ICD-10-CM | POA: Diagnosis not present

## 2015-08-23 DIAGNOSIS — R21 Rash and other nonspecific skin eruption: Secondary | ICD-10-CM

## 2015-08-23 NOTE — Progress Notes (Signed)
Patient ID: Rodney [name], male   DOB: February 17, 1951, 65 y.o.   MRN: [id number]    DATE:  08/23/2015   MRN:  [id number]  BIRTHDAY: 12/27/1950  Facility:  Nursing Home Location:  [place] Room Number: [id number]  LEVEL OF CARE:  SNF [phone])  Contact Information    Name Relation Home Work [place] Brother [phone]  [phone]   Goar,Phyllis Relative [phone]  [phone]       Code Status History    Date Active Date Inactive Code Status Order ID Comments User Context   08/15/2015 11:40 PM 08/22/2015 10:54 PM Full Code [id number]  Edwin Dada, MD Inpatient   08/04/2015  1:08 AM 08/04/2015  3:15 PM Full Code [id number]  Charolette Forward, MD ED   02/17/2015  4:35 PM 02/18/2015  5:10 PM Full Code [id number]  Ulyses Amor, PA-C Inpatient   02/16/2015  5:42 PM 02/17/2015  4:25 PM Full Code [id number]  Ulyses Amor, PA-C Inpatient   02/09/2015  7:55 PM 02/10/2015  8:42 PM Full Code [id number]  Angelia Mould, MD Inpatient       Chief Complaint  Patient presents with  . Hospitalization Follow-up    HISTORY OF PRESENT ILLNESS:  This is a 65 year old male who has been admitted to [facility] on 08/22/15 from [facility]. He has PMH of CAD, prostate CA S/P prostatectomy in 2010 and peripheral vascular disease.He was hospitalized and treated for scrotal cellulitis. He was treated with IV vancomycin and then transitioned to oral antibiotics. He was also found to have hyponatremia with NA 123 and serum creatinine 1.86. She was treated with IV fluids.  He has been admitted for a short-term rehabilitation.  PAST MEDICAL HISTORY:  Past Medical History  Diagnosis Date  . Prostate cancer ([place]) 05/03/2009    Prostatectomy/Adenocrcinoma  . Heart murmur     history  . Cataract     left eye surgery/   . Radiation 07/02/11-08/22/11    Prostate fossa 68.4 gray 38 fractions  . Cellulitis of scrotum   . Hyponatremia   . Bullous pemphigoid    . Peripheral vascular disease ([place])   . AKI (acute kidney injury) ([place])   . Thrombocytopenia ([place])   . CAD (coronary artery disease)   . Severe malnutrition ([place])   . Alcohol dependence ([place])      CURRENT MEDICATIONS: Reviewed  Patient's Medications  New Prescriptions   No medications on file  Previous Medications   ASPIRIN EC 81 MG EC TABLET    Take 1 tablet (81 mg total) by mouth daily.   ATORVASTATIN (LIPITOR) 80 MG TABLET    Take 1 tablet (80 mg total) by mouth daily at 6 PM.   CEPHALEXIN (KEFLEX) 500 MG CAPSULE    Take 1 capsule (500 mg total) by mouth every 12 (twelve) hours.   DOXYCYCLINE (VIBRA-TABS) 100 MG TABLET    Take 1 tablet (100 mg total) by mouth every 12 (twelve) hours.   DUREZOL 0.05 % EMUL    Place 1 drop into the right eye 2 (two) times daily.   HYDROCODONE-ACETAMINOPHEN (NORCO/VICODIN) 5-325 MG TABLET    Take 1-2 tablets by mouth every 4 (four) hours as needed for moderate pain.   LATANOPROST (XALATAN) 0.005 % OPHTHALMIC SOLUTION    Place 1 drop into both eyes at bedtime.   METOPROLOL SUCCINATE (TOPROL-XL) 25 MG 24 HR TABLET    Take 25 mg by mouth 2 (two)  times daily.   NITROGLYCERIN (NITROSTAT) 0.4 MG SL TABLET    Place 1 tablet (0.4 mg total) under the tongue every 5 (five) minutes x 3 doses as needed for chest pain.   PANTOPRAZOLE (PROTONIX) 40 MG TABLET    Take 1 tablet (40 mg total) by mouth daily.   PREDNISONE (DELTASONE) 10 MG TABLET    Take 1 tablet (10 mg total) by mouth 2 (two) times daily with a meal.   PROTEIN (PROCEL) POWD    Take 2 scoop by mouth 2 (two) times daily.   TRIAMCINOLONE CREAM (KENALOG) 0.1 %    Apply 1 application topically 3 (three) times daily. Apply to rashes on sacral area x2 weeks.  Modified Medications   No medications on file  Discontinued Medications   No medications on file     Allergies  Allergen Reactions  . Other Rash    polyestor and metals except titanium     REVIEW OF SYSTEMS:  GENERAL: no change in appetite,  no fatigue, no weight changes, no fever, chills or weakness EYES: Denies change in vision, dry eyes, eye pain, itching or discharge EARS: Denies change in hearing, ringing in ears, or earache NOSE: Denies nasal congestion or epistaxis MOUTH and THROAT: Denies oral discomfort, gingival pain or bleeding, pain from teeth or hoarseness   RESPIRATORY: no cough, SOB, DOE, wheezing, hemoptysis CARDIAC: no chest pain, edema or palpitations GI: no abdominal pain, diarrhea, constipation, heart burn, nausea or vomiting GU: Denies dysuria, frequency, hematuria, incontinence, or discharge PSYCHIATRIC: Denies feeling of depression or anxiety. No report of hallucinations, insomnia, paranoia, or agitation   PHYSICAL EXAMINATION  GENERAL APPEARANCE: Well nourished. In no acute distress. Normal body habitus SKIN:  Scrotum is enlarged, edematous, erythematous, right forearm with abrasion covered with tegaderm, multiple scabs on BUE and left knee, sacral area is erythematous HEAD: Normal in size and contour. No evidence of trauma EYES: Lids open and close normally. No blepharitis, entropion or ectropion. PERRL. Conjunctivae are clear and sclerae are white. Lenses are without opacity EARS: Pinnae are normal. Patient hears normal voice tunes of the examiner MOUTH and THROAT: Lips are without lesions. Oral mucosa is moist and without lesions. Tongue is normal in shape, size, and color and without lesions NECK: supple, trachea midline, no neck masses, no thyroid tenderness, no thyromegaly LYMPHATICS: no LAN in the neck, no supraclavicular LAN RESPIRATORY: breathing is even & unlabored, BS CTAB CARDIAC: RRR, no murmur,no extra heart sounds, no edema GI: abdomen soft, normal BS, no masses, no tenderness, no hepatomegaly, no splenomegaly EXTREMITIES:  Able to move X 4 extremities; generalized weakness BLE PSYCHIATRIC: Alert and oriented X 3. Affect and behavior are appropriate  LABS/RADIOLOGY: Labs  reviewed: Basic Metabolic Panel:  Recent Labs  08/20/15 0500 08/21/15 0338 08/22/15 0352  NA 129* 130* 131*  K 4.6 4.2 4.1  CL 99* 101 100*  CO2 22 22 21*  GLUCOSE 106* 118* 114*  BUN 23* 24* 26*  CREATININE 2.25* 2.24* 2.26*  CALCIUM 8.9 8.8* 8.7*   Liver Function Tests:  Recent Labs  08/04/15 0136 08/15/15 1912 08/17/15 0401  AST 27 26 14*  ALT 31 35 27  ALKPHOS 92 82 66  BILITOT 0.7 0.8 0.7  PROT 4.5* 4.8* 4.4*  ALBUMIN 2.1* 2.1* 1.9*   CBC:  Recent Labs  02/09/15 1700  08/04/15 0136  08/15/15 1912  08/19/15 0420 08/20/15 0500 08/21/15 0338  WBC 11.6*  < > 10.1  < > 8.7  < > 12.2*  14.1* 12.0*  NEUTROABS 9.9*  --  9.0*  --  7.6  --   --   --   --   HGB 13.2  < > 11.3*  < > 9.1*  < > 9.3* 9.8* 9.0*  HCT 38.3*  < > 33.1*  < > 26.0*  < > 28.1* 29.5* 27.0*  MCV 94.6  < > 94.8  < > 91.9  < > 98.6 98.7 98.5  PLT 244  < > 152  < > 80*  < > 202 268 250  < > = values in this interval not displayed.  Lipid Panel:  Recent Labs  08/04/15 0447  HDL 95   Cardiac Enzymes:  Recent Labs  08/04/15 0136 08/04/15 0447 08/04/15 1311  TROPONINI <0.03 <0.03 <0.03    CBG:  Recent Labs  02/17/15 0532 08/17/15 0809  GLUCAP 90 90      Dg Chest 2 View  08/15/2015  CLINICAL DATA:  Shortness of breath. EXAM: CHEST  2 VIEW COMPARISON:  August 03, 2015. FINDINGS: The heart size and mediastinal contours are within normal limits. Both lungs are clear. No pneumothorax or pleural effusion is noted. The visualized skeletal structures are unremarkable. IMPRESSION: No active cardiopulmonary disease. Electronically Signed   By: Marijo Conception, M.D.   On: 08/15/2015 15:42   Dg Chest 2 View  08/03/2015  CLINICAL DATA:  Chest pain starting today. Myocardial infarction in 2009. EXAM: CHEST  2 VIEW COMPARISON:  02/16/2015 FINDINGS: Emphysema. Mild atherosclerotic calcification of the aortic arch. Heart size within normal limits. Left lateral ninth and tenth rib irregularities,  probably old fractures but not readily visible on 02/09/2015 CT, correlate with any point tenderness along the left lower chest. Biapical pleural parenchymal scarring.  No pleural effusion. IMPRESSION: 1. Left lateral ninth and tenth rib irregularities probably from old fractures, correlate with any point tenderness in this vicinity in assessing for more recent fracture. 2. Emphysema. 3. Atherosclerotic aortic arch. Electronically Signed   By: Van Clines M.D.   On: 08/03/2015 16:55   US Abdomen Complete  08/16/2015  CLINICAL DATA:  Generalized abdominal pain EXAM: ABDOMEN ULTRASOUND COMPLETE COMPARISON:  None. FINDINGS: Gallbladder: No gallstones or wall thickening visualized. No sonographic Murphy sign noted by sonographer. Common bile duct: Diameter: 3.4 mm Liver: No focal lesion identified. Within normal limits in parenchymal echogenicity. IVC: No abnormality visualized. Pancreas: Visualized portion unremarkable. Spleen: Size and appearance within normal limits. Right Kidney: Length: 10.2 cm. Echogenicity within normal limits. No mass or hydronephrosis visualized. Left Kidney: Length: 9.6 cm. Echogenicity within normal limits. No mass or hydronephrosis visualized. Abdominal aorta: No aneurysm visualized. Other findings: Small right pleural effusion. IMPRESSION: 1. No cholelithiasis or sonographic evidence of acute cholecystitis. 2. No obstructive uropathy. Electronically Signed   By: Kathreen Devoid   On: 08/16/2015 16:25   US Scrotum  08/15/2015  CLINICAL DATA:  Enlarged painful, inflamed scrotum EXAM: ULTRASOUND OF SCROTUM TECHNIQUE: Complete ultrasound examination of the testicles, epididymis, and other scrotal structures was performed. COMPARISON:  None available FINDINGS: Right testicle Measurements: 3.2 x 2.2 x 2.3 cm. No mass or microlithiasis visualized. Left testicle Measurements: 3.2 x 1.8 x 2.3 cm. No mass or microlithiasis visualized. Right epididymis: Enlarged, heterogeneous in appearance,  no significant hypervascularity. Left epididymis: Small left epididymal incidental cyst measures 3 mm. Left epididymis also appears enlarged, heterogeneous and ill-defined. Hydrocele: Heterogeneous complex mixed echogenicity hydroceles present bilaterally with diffuse scrotal edema and skin thickening, difficult to exclude pyocele / infection. Varicocele:  None visualized. IMPRESSION: Diffuse scrotal skin thickening and edema with bilateral complex heterogeneous hydroceles and/or pyoceles. Difficult to exclude abscess. No testicular abnormality. Electronically Signed   By: Jerilynn Mages.  Shick M.D.   On: 08/15/2015 16:42   Ct Abdomen Pelvis W Contrast  08/15/2015  CLINICAL DATA:  Groin pain with walking and moving since 08/10/2015, swelling, dysuria, on Cipro for possible UTI, pain is burning and non radiating, no fever or chills, history prostate cancer, former smoker EXAM: CT ABDOMEN AND PELVIS WITH CONTRAST TECHNIQUE: Multidetector CT imaging of the abdomen and pelvis was performed using the standard protocol following bolus administration of intravenous contrast. Sagittal and coronal MPR images reconstructed from axial data set. CONTRAST:  144mL OMNIPAQUE IOHEXOL 300 MG/ML SOLN IV. Dilute oral contrast. COMPARISON:  CT abdomen and pelvis 02/09/2015 FINDINGS: Lung bases clear. Atrophic pancreas. Liver, gallbladder, spleen, pancreas, kidneys, and adrenal glands otherwise unremarkable. Extensive atherosclerotic calcification greatest at the iliac arteries. Coronary arterial calcifications as well. 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 Normal appendix. Unremarkable bladder and ureters. Prostate gland surgically absent. Question small BILATERAL inguinal hernias containing fat. Scattered edema within the subcutaneous soft tissues at the inferior pelvis particularly anteriorly. Significant soft tissue swelling/ edema and question hydroceles in scrotum. Stomach and bowel loops normal  appearance. No mass, adenopathy, free air, or free fluid. No acute osseous lesions. IMPRESSION: Question small BILATERAL inguinal hernias containing fat. Significant soft tissue swelling of the scrotum with question BILATERAL hydroceles. No acute intra-abdominal or intrapelvic abnormalities otherwise seen. Extensive atherosclerotic calcification as above. Electronically Signed   By: Lavonia Dana M.D.   On: 08/15/2015 17:25    ASSESSMENT/PLAN:  Generalized weakness - for rehabilitation  Scrotal cellulitis - scrotal ultrasound showed diffuse skin thickening and edema with bilateral hydroceles; started with Zosyn and Cefepime then switched to Doxycycline and Cephalexin; follow-up with urology in 2 weeks; elevate scrotum @ rest; continue wound treatment; continue Norco 5/325 mg 1-2 tabs by mouth every 4 hours when necessary for pain  Hyponatremia - NA 131; likely due to volume depletion; IV fluids given; will monitor  Bullous pemphigoid - continue prednisone 10 mg 1 tab by mouth twice a day; follow-up with dermatologist, Dr. Arther Dames  AKI - likely due to volume depletion and contrast nephropathy; creatinine 2.26; Altace was recently discontinued; check BMP  Coronary artery disease - S/P heart catheterization 08/04/15, non-obstructive CAD; continue Lipit - or, aspirin, NTG when necessary and metoprolol succinate  Protein calorie malnutrition, severe - albumin 1.9; start Procel 2 scoops by mouth twice a day  Alcohol dependence - reports drinking 6-8 beers daily; monitor for alcohol withdrawal  Leukocytosis - wbc 0.0; on prednisone; will monitor  Anemia of chronic disease - hemoglobin 9.0; check CBC  Hyperlipidemia - continue atorvastatin 80 mg 1 tab by mouth daily  Hypertension - well-controlled; continue upper legs L 25 mg 1 tab by mouth twice a day  GERD - stable; continue Protonix 40 mg 1 tab by mouth daily  Rashes on sacral area - start triamcinolone 0.1% ointment to rashes on sacral area  3 times a day 2 weeks      Goals of care:  Short-term rehabilitation     [facility], [facility] [phone]

## 2015-08-24 ENCOUNTER — Encounter (HOSPITAL_COMMUNITY): Payer: Self-pay

## 2015-08-24 ENCOUNTER — Emergency Department (HOSPITAL_COMMUNITY)
Admission: EM | Admit: 2015-08-24 | Discharge: 2015-08-25 | Disposition: A | Discharge status: Home / Self Care | Payer: 59 | Attending: Emergency Medicine | Admitting: Emergency Medicine

## 2015-08-24 DIAGNOSIS — Z872 Personal history of diseases of the skin and subcutaneous tissue: Secondary | ICD-10-CM | POA: Insufficient documentation

## 2015-08-24 DIAGNOSIS — R45851 Suicidal ideations: Secondary | ICD-10-CM | POA: Diagnosis present

## 2015-08-24 DIAGNOSIS — Z8669 Personal history of other diseases of the nervous system and sense organs: Secondary | ICD-10-CM | POA: Diagnosis not present

## 2015-08-24 DIAGNOSIS — Z7952 Long term (current) use of systemic steroids: Secondary | ICD-10-CM | POA: Insufficient documentation

## 2015-08-24 DIAGNOSIS — Z923 Personal history of irradiation: Secondary | ICD-10-CM | POA: Diagnosis not present

## 2015-08-24 DIAGNOSIS — Z862 Personal history of diseases of the blood and blood-forming organs and certain disorders involving the immune mechanism: Secondary | ICD-10-CM | POA: Insufficient documentation

## 2015-08-24 DIAGNOSIS — Z79899 Other long term (current) drug therapy: Secondary | ICD-10-CM | POA: Insufficient documentation

## 2015-08-24 DIAGNOSIS — Z7982 Long term (current) use of aspirin: Secondary | ICD-10-CM | POA: Insufficient documentation

## 2015-08-24 DIAGNOSIS — Z792 Long term (current) use of antibiotics: Secondary | ICD-10-CM | POA: Diagnosis not present

## 2015-08-24 DIAGNOSIS — Z8639 Personal history of other endocrine, nutritional and metabolic disease: Secondary | ICD-10-CM | POA: Insufficient documentation

## 2015-08-24 DIAGNOSIS — I2510 Atherosclerotic heart disease of native coronary artery without angina pectoris: Secondary | ICD-10-CM | POA: Diagnosis not present

## 2015-08-24 DIAGNOSIS — Z8546 Personal history of malignant neoplasm of prostate: Secondary | ICD-10-CM | POA: Insufficient documentation

## 2015-08-24 DIAGNOSIS — Z87448 Personal history of other diseases of urinary system: Secondary | ICD-10-CM | POA: Diagnosis not present

## 2015-08-24 DIAGNOSIS — F4325 Adjustment disorder with mixed disturbance of emotions and conduct: Secondary | ICD-10-CM | POA: Insufficient documentation

## 2015-08-24 DIAGNOSIS — Z87891 Personal history of nicotine dependence: Secondary | ICD-10-CM | POA: Insufficient documentation

## 2015-08-24 DIAGNOSIS — R011 Cardiac murmur, unspecified: Secondary | ICD-10-CM | POA: Diagnosis not present

## 2015-08-24 DIAGNOSIS — Z87438 Personal history of other diseases of male genital organs: Secondary | ICD-10-CM | POA: Diagnosis not present

## 2015-08-24 DIAGNOSIS — Z9889 Other specified postprocedural states: Secondary | ICD-10-CM | POA: Insufficient documentation

## 2015-08-24 DIAGNOSIS — F4329 Adjustment disorder with other symptoms: Secondary | ICD-10-CM

## 2015-08-24 NOTE — ED Notes (Signed)
Pt adamantly denying SI/HI/AVH.  Sts "I was just wise cracking w/ a couple guys."

## 2015-08-24 NOTE — BH Assessment (Signed)
Assessment Note  Rodney [name] is an 65 y.o. male. Patient brought to [facility] via EMS from [facility]. He presents with the c/o  making comments to staff that he is suicidal this afternoon.Per ED notes,  "Pt's son reports that he has been making comments for several months". Pt was recently admitted to [facility] for rehabilitation services. Patient recently had surgery for a enlarged scrotum. Pt is unable to ambulate independently and has been bed bound x 1 week. Pt lost his wife x 15 months ago. Today the rehabilitation staff were trying to work w/ patient and he made the comment, "It would probably be better to put a bullet in my head.'"   Pt denies SI/HI/AVH w/ TTS. He denies history of SI/HI/AVH's. Denies depressive symptoms and anxiety. No stressors reported. No previous inpatient hospitalizations for mental health reasons. No current psychiatrist or therapist.   Diagnosis: Mood Disorder  Past Medical History:  Past Medical History  Diagnosis Date  . Prostate cancer ([place]) 05/03/2009    Prostatectomy/Adenocrcinoma  . Heart murmur     history  . Cataract     left eye surgery/   . Radiation 07/02/11-08/22/11    Prostate fossa 68.4 gray 38 fractions  . Cellulitis of scrotum   . Hyponatremia   . Bullous pemphigoid   . Peripheral vascular disease ([place])   . AKI (acute kidney injury) ([place])   . Thrombocytopenia ([place])   . CAD (coronary artery disease)   . Severe malnutrition ([place])   . Alcohol dependence ([place])     Past Surgical History  Procedure Laterality Date  . Left eye surgery    . Hemorrhoid surgery    . Robot assisted laparoscopic radical prostatectomy  05/03/2009  . Wrist surgery      left  . Left heart catheterization with coronary angiogram N/A 02/25/2012    Procedure: LEFT HEART CATHETERIZATION WITH CORONARY ANGIOGRAM;  Surgeon: Clent Demark, MD;  Location: [facility] CATH LAB;  Service: Cardiovascular;  Laterality: N/A;  . Peripheral vascular catheterization N/A 02/10/2015     Procedure: Abdominal Aortogram;  Surgeon: Elam Dutch, MD;  Location: [place] CV LAB;  Service: Cardiovascular;  Laterality: N/A;  . Peripheral vascular catheterization Bilateral 02/10/2015    Procedure: Lower Extremity Angiography;  Surgeon: Elam Dutch, MD;  Location: [place] CV LAB;  Service: Cardiovascular;  Laterality: Bilateral;  . Endarterectomy femoral Left 02/17/2015    Procedure: LEFT FEMORAl ENDARTERECTOMY ;  Surgeon: Angelia Mould, MD;  Location: [place];  Service: Vascular;  Laterality: Left;  . Patch angioplasty Left 02/17/2015    Procedure: VEIN PATCH ANGIOPLASTY;  Surgeon: Angelia Mould, MD;  Location: [place];  Service: Vascular;  Laterality: Left;  . Cardiac catheterization N/A 08/04/2015    Procedure: Left Heart Cath and Coronary Angiography;  Surgeon: Charolette Forward, MD;  Location: [place] CV LAB;  Service: Cardiovascular;  Laterality: N/A;    Family History:  Family History  Problem Relation Age of Onset  . Uterine cancer Mother 29    going to [employer]  . Breast cancer Sister     Social History:  reports that he quit smoking about 8 months ago. His smoking use included Cigarettes. He has a 72 pack-year smoking history. He has never used smokeless tobacco. He reports that he drinks about 3.6 oz of alcohol per week. He reports that he does not use illicit drugs.  Additional Social History:  Alcohol / Drug Use Pain Medications: SEE MAR Prescriptions: SEE  MAR Over the Counter: SEE MAR History of alcohol / drug use?: No history of alcohol / drug abuse  CIWA: CIWA-Ar BP: 120/70 mmHg Pulse Rate: 87 COWS:    Allergies:  Allergies  Allergen Reactions  . Other Rash    polyestor and metals except titanium    Home Medications:  (Not in a hospital admission)  OB/GYN Status:  No LMP for male patient.  General Assessment Data Location of Assessment: [facility] ED TTS Assessment: In system Is this a Tele or Face-to-Face Assessment?:  Face-to-Face Is this an Initial Assessment or a Re-assessment for this encounter?: Initial Assessment Marital status: Widowed [place] name:  (n/a) Is patient pregnant?: No Pregnancy Status: No Living Arrangements: Other (Comment) (living at [facility] for [place]) Can pt return to current living arrangement?: No Admission Status: Voluntary Is patient capable of signing voluntary admission?: No Referral Source: Self/Family/Friend Insurance type:  Secretary/administrator)  Medical Screening Exam ([place]) Medical Exam completed: No Reason for MSE not completed:  (n/a)  [place] Arrangements: Other (Comment) (living at [employer] for [place]) Legal Guardian:  (no legal guardian) Name of Psychiatrist:  (no psychiatrist ) Name of Therapist:  (no therapsit )  Education Status Is patient currently in school?: No Current Grade:  (n/a) Highest grade of school patient has completed:  (n/a) Name of school:  (n/a) Contact person:  (n/a)  Risk to self with the past 6 months Suicidal Ideation: No Has patient been a risk to self within the past 6 months prior to admission? : No Suicidal Intent: No Has patient had any suicidal intent within the past 6 months prior to admission? : No Is patient at risk for suicide?: No Suicidal Plan?: No Has patient had any suicidal plan within the past 6 months prior to admission? : No Access to Means: No What has been your use of drugs/alcohol within the last 12 months?:  (n/a) Previous Attempts/Gestures: No How many times?:  (0) Other Self Harm Risks:  (n/a) Triggers for Past Attempts:  (no previous triggers ) Intentional Self Injurious Behavior: None Family Suicide History: No Recent stressful life event(s): Other (Comment) (currently in rehab from recent sugery; pain, difficulty walk) Persecutory voices/beliefs?: No Depression: No Depression Symptoms:  (none reported) Substance abuse history and/or treatment for substance  abuse?: No Suicide prevention information given to non-admitted patients: Not applicable  Risk to Others within the past 6 months Homicidal Ideation: No Does patient have any lifetime risk of violence toward others beyond the six months prior to admission? : No Thoughts of Harm to Others: No Current Homicidal Intent: No Current Homicidal Plan: No Access to Homicidal Means: No Identified Victim:  (n/a) History of harm to others?: No Assessment of Violence: None Noted Violent Behavior Description:  (patient is calm and cooperatie ) Does patient have access to weapons?: No Criminal Charges Pending?: No Does patient have a court date: No Is patient on probation?: No  Psychosis Hallucinations: None noted Delusions: None noted  Mental Status Report Appearance/Hygiene: In hospital gown Eye Contact: Good Motor Activity: Freedom of movement Speech: Logical/coherent Level of Consciousness: Alert Mood:  (approprite) Affect: Appropriate to circumstance Anxiety Level: Minimal Thought Processes: Coherent, Relevant Judgement: Unimpaired Orientation: Person, Place, Time, Situation Obsessive Compulsive Thoughts/Behaviors: None  Cognitive Functioning Concentration: Decreased Memory: Recent Intact, Remote Intact IQ: Average Insight: Fair Impulse Control: Fair Appetite: Fair Weight Loss:  (none reported) Weight Gain:  (none reported) Sleep: Decreased Total Hours of Sleep:  (varies ) Vegetative Symptoms:  None  ADLScreening [facility] Assessment Services) Patient's cognitive ability adequate to safely complete daily activities?: Yes Patient able to express need for assistance with ADLs?: Yes Independently performs ADLs?: Yes (appropriate for developmental age)  Prior Inpatient Therapy Prior Inpatient Therapy: No Prior Therapy Dates:  (n/a) Prior Therapy Facilty/Provider(s):  (n/a) Reason for Treatment:  (n/a)  Prior Outpatient Therapy Prior Outpatient Therapy: No Prior Therapy  Dates:  (n/a) Prior Therapy Facilty/Provider(s):  (n/a) Reason for Treatment:  (n/a) Does patient have an ACCT team?: Yes Does patient have Intensive In-House Services?  : No Does patient have [facility] services? : No Does patient have P4CC services?: No  ADL Screening (condition at time of admission) Patient's cognitive ability adequate to safely complete daily activities?: Yes Is the patient deaf or have difficulty hearing?: No Does the patient have difficulty seeing, even when wearing glasses/contacts?: No Does the patient have difficulty concentrating, remembering, or making decisions?: No Patient able to express need for assistance with ADLs?: Yes Does the patient have difficulty dressing or bathing?: No Independently performs ADLs?: Yes (appropriate for developmental age) Does the patient have difficulty walking or climbing stairs?: No Weakness of Legs: None Weakness of Arms/Hands: None  Home Assistive Devices/Equipment Home Assistive Devices/Equipment: None    Abuse/Neglect Assessment (Assessment to be complete while patient is alone) Physical Abuse: Denies Verbal Abuse: Denies Sexual Abuse: Denies Exploitation of patient/patient's resources: Denies Self-Neglect: Denies Values / Beliefs Cultural Requests During Hospitalization: None Spiritual Requests During Hospitalization: None   Advance Directives (For Healthcare) Does patient have an advance directive?: Yes Type of Advance Directive: [place] Does patient want to make changes to advanced directive?: No - Patient declined Copy of advanced directive(s) in chart?: No - copy requested    Additional Information 1:1 In Past 12 Months?: No CIRT Risk: No Elopement Risk: No Does patient have medical clearance?: No     Disposition:  Disposition Initial Assessment Completed for this Encounter: Yes Disposition of Patient: Other dispositions (Per Reginold Agent, NP observe overnight; Re-evaluate in the  morn) Other disposition(s): Other (Comment) (Psychiatry will re-evaluate in the am )  On Site Evaluation by:   Reviewed with Physician:    Waldon Merl [facility] 08/24/2015 6:14 PM

## 2015-08-24 NOTE — ED Notes (Signed)
Bed: [facility] Expected date:  Expected time:  Means of arrival:  Comments: Ems/SI/non-ambulatory

## 2015-08-24 NOTE — ED Notes (Signed)
Per EMS, Pt, from [facility], presents after making comments to staff that he is suicidal this afternoon.  Pt's son reports that he has been making comments x months.  Pt was recently admitted to [facility] d/t enlarged scrotum.  Pt is unable to ambulate independently and has been bed bound x 1 week.  Pt lost his wife x 15 months ago.  EMS reports "staff was trying to work w/ him and he said something like 'it would probably be better to put a bullet in my head.'"  Pt denies SI/HI/AVH w/ EMS.

## 2015-08-24 NOTE — ED Notes (Signed)
Pt given a sandwich and soda by MD.

## 2015-08-24 NOTE — ED Notes (Signed)
Pt's diaper taken off and dressing around scrotum changed. Pt's scrotum approx.size of a cantulope with minimal drainage. Towel placed under scrotum for support.

## 2015-08-24 NOTE — ED Provider Notes (Signed)
CSN: [id number]     Arrival date & time 08/24/15  1642 History   First MD Initiated Contact with Patient 08/24/15 1649     Chief Complaint  Patient presents with  . Suicidal     (Consider location/radiation/quality/duration/timing/severity/associated sxs/prior Treatment) HPI Comments: 65 year old [name] with past medical history below who presents for evaluation of suicidal ideation. Pt is currently at [facility] place for rehabilitation due to recent hospitalization for scrotal swelling. Today during physical therapy they were trying to get him to stand up, which he has had problems with due to his scrotal pain. He eventually made a comment to staff members stating that "you might as well put a bullet in my head." He states that he was saying this statement jokingly and has never actually considered suicide. He denies being depressed and states that he is eating well and sleeping well. He denies any homicidal ideation or hallucinations. He feels like he has a good support system with lots of family members nearby.  The history is provided by the patient.    Past Medical History  Diagnosis Date  . Prostate cancer ([place]) 05/03/2009    Prostatectomy/Adenocrcinoma  . Heart murmur     history  . Cataract     left eye surgery/   . Radiation 07/02/11-08/22/11    Prostate fossa 68.4 gray 38 fractions  . Cellulitis of scrotum   . Hyponatremia   . Bullous pemphigoid   . Peripheral vascular disease ([place])   . AKI (acute kidney injury) ([employer])   . Thrombocytopenia ([place])   . CAD (coronary artery disease)   . Severe malnutrition ([place])   . Alcohol dependence ([place])    Past Surgical History  Procedure Laterality Date  . Left eye surgery    . Hemorrhoid surgery    . Robot assisted laparoscopic radical prostatectomy  05/03/2009  . Wrist surgery      left  . Left heart catheterization with coronary angiogram N/A 02/25/2012    Procedure: LEFT HEART CATHETERIZATION WITH CORONARY ANGIOGRAM;  Surgeon: Clent Demark, MD;  Location: [facility] CATH LAB;  Service: Cardiovascular;  Laterality: N/A;  . Peripheral vascular catheterization N/A 02/10/2015    Procedure: Abdominal Aortogram;  Surgeon: Elam Dutch, MD;  Location: [place] CV LAB;  Service: Cardiovascular;  Laterality: N/A;  . Peripheral vascular catheterization Bilateral 02/10/2015    Procedure: Lower Extremity Angiography;  Surgeon: Elam Dutch, MD;  Location: [place] CV LAB;  Service: Cardiovascular;  Laterality: Bilateral;  . Endarterectomy femoral Left 02/17/2015    Procedure: LEFT FEMORAl ENDARTERECTOMY ;  Surgeon: Angelia Mould, MD;  Location: [place];  Service: Vascular;  Laterality: Left;  . Patch angioplasty Left 02/17/2015    Procedure: VEIN PATCH ANGIOPLASTY;  Surgeon: Angelia Mould, MD;  Location: [place];  Service: Vascular;  Laterality: Left;  . Cardiac catheterization N/A 08/04/2015    Procedure: Left Heart Cath and Coronary Angiography;  Surgeon: Charolette Forward, MD;  Location: [place] CV LAB;  Service: Cardiovascular;  Laterality: N/A;   Family History  Problem Relation Age of Onset  . Uterine cancer Mother 95    going to [facility]  . Breast cancer Sister    Social History  Substance Use Topics  . Smoking status: Former Smoker -- 2.00 packs/day for 36 years    Types: Cigarettes    Quit date: 12/08/2014  . Smokeless tobacco: Never Used  . Alcohol Use: 3.6 oz/week    6 Cans of beer per week  Comment: 6-7  cans drinks daily 12 oz    Review of Systems 10 Systems reviewed and are negative for acute change except as noted in the HPI.    Allergies  Other  Home Medications   Prior to Admission medications   Medication Sig Start Date End Date Taking? Authorizing Provider  aspirin EC 81 MG EC tablet Take 1 tablet (81 mg total) by mouth daily. 02/27/12   Charolette Forward, MD  atorvastatin (LIPITOR) 80 MG tablet Take 1 tablet (80 mg total) by mouth daily at 6 PM. 02/27/12   Charolette Forward, MD  cephALEXin  (KEFLEX) 500 MG capsule Take 1 capsule (500 mg total) by mouth every 12 (twelve) hours. 08/22/15   Orson Eva, MD  doxycycline (VIBRA-TABS) 100 MG tablet Take 1 tablet (100 mg total) by mouth every 12 (twelve) hours. 08/22/15   Orson Eva, MD  DUREZOL 0.05 % EMUL Place 1 drop into the right eye 2 (two) times daily. 07/10/15   Historical Provider, MD  HYDROcodone-acetaminophen (NORCO/VICODIN) 5-325 MG tablet Take 1-2 tablets by mouth every 4 (four) hours as needed for moderate pain. 08/22/15   Orson Eva, MD  latanoprost (XALATAN) 0.005 % ophthalmic solution Place 1 drop into both eyes at bedtime.    Historical Provider, MD  metoprolol succinate (TOPROL-XL) 25 MG 24 hr tablet Take 25 mg by mouth 2 (two) times daily.    Historical Provider, MD  nitroGLYCERIN (NITROSTAT) 0.4 MG SL tablet Place 1 tablet (0.4 mg total) under the tongue every 5 (five) minutes x 3 doses as needed for chest pain. 02/27/12   Charolette Forward, MD  pantoprazole (PROTONIX) 40 MG tablet Take 1 tablet (40 mg total) by mouth daily. 08/04/15   Charolette Forward, MD  predniSONE (DELTASONE) 10 MG tablet Take 1 tablet (10 mg total) by mouth 2 (two) times daily with a meal. 08/22/15   Orson Eva, MD  Protein (PROCEL) POWD Take 2 scoop by mouth 2 (two) times daily.    Historical Provider, MD  triamcinolone cream (KENALOG) 0.1 % Apply 1 application topically 3 (three) times daily. Apply to rashes on sacral area x2 weeks.    Historical Provider, MD   BP 120/70 mmHg  Pulse 87  Resp 16  SpO2 98% Physical Exam  Constitutional: He is oriented to person, place, and time. He appears well-developed and well-nourished. No distress.  HENT:  Head: Normocephalic and atraumatic.  Mouth/Throat: Oropharynx is clear and moist.  Eyes: Conjunctivae are normal.  L pupil irregularly shaped and non-reactive  Cardiovascular: Normal rate, regular rhythm and normal heart sounds.   No murmur heard. Pulmonary/Chest: Effort normal and breath sounds normal. No respiratory  distress.  Abdominal: Soft. Bowel sounds are normal. He exhibits no distension. There is no tenderness.  Neurological: He is alert and oriented to person, place, and time.  Skin: Skin is warm and dry.  Psychiatric: He has a normal mood and affect. Judgment normal.  Nursing note and vitals reviewed.   ED Course  Procedures (including critical care time) Labs Review Labs Reviewed - No data to display    MDM   Final diagnoses:  None   PT brought in for evaluation of a comment suggesting suicidal ideation during physical therapy today. Pt was comfortable And pleasant during exam. He denied SI/HI/AVH. Because he was sent for evaluation due to staff concerns, consulted TTS for evaluation.  8:54 PM Per TTS notes, the patient will be held overnight for psychiatry reevaluation in the morning. Disposition is pending psychiatry  recommendations.  Sharlett Iles, MD 08/24/15 [phone]

## 2015-08-25 DIAGNOSIS — F4329 Adjustment disorder with other symptoms: Secondary | ICD-10-CM

## 2015-08-25 MED ORDER — DOXYCYCLINE HYCLATE 100 MG PO TABS
100.0000 mg | ORAL_TABLET | Freq: Two times a day (BID) | ORAL | Status: DC
  Administered 2015-08-25: 100 mg via ORAL
  Filled 2015-08-25: qty 1

## 2015-08-25 MED ORDER — ASPIRIN EC 81 MG PO TBEC
81.0000 mg | DELAYED_RELEASE_TABLET | Freq: Every day | ORAL | Status: DC
  Administered 2015-08-25: 81 mg via ORAL
  Filled 2015-08-25 (×2): qty 1

## 2015-08-25 MED ORDER — CEPHALEXIN 500 MG PO CAPS
500.0000 mg | ORAL_CAPSULE | Freq: Two times a day (BID) | ORAL | Status: DC
  Administered 2015-08-25: 500 mg via ORAL
  Filled 2015-08-25: qty 1

## 2015-08-25 MED ORDER — DIFLUPREDNATE 0.05 % OP EMUL: 1.0000 [drp] | Freq: Two times a day (BID) | OPHTHALMIC | Status: DC

## 2015-08-25 MED ORDER — ATORVASTATIN CALCIUM 80 MG PO TABS
80.0000 mg | ORAL_TABLET | Freq: Every day | ORAL | Status: DC
  Filled 2015-08-25: qty 1

## 2015-08-25 MED ORDER — PREDNISONE 20 MG PO TABS
10.0000 mg | ORAL_TABLET | Freq: Two times a day (BID) | ORAL | Status: DC
  Administered 2015-08-25: 10 mg via ORAL
  Filled 2015-08-25: qty 0.5

## 2015-08-25 MED ORDER — HYDROCODONE-ACETAMINOPHEN 5-325 MG PO TABS
1.0000 | ORAL_TABLET | ORAL | Status: DC | PRN
  Administered 2015-08-25: 1 via ORAL
  Filled 2015-08-25: qty 1

## 2015-08-25 MED ORDER — PANTOPRAZOLE SODIUM 40 MG PO TBEC
40.0000 mg | DELAYED_RELEASE_TABLET | Freq: Every day | ORAL | Status: DC
  Administered 2015-08-25: 40 mg via ORAL
  Filled 2015-08-25: qty 1

## 2015-08-25 MED ORDER — NITROGLYCERIN 0.4 MG SL SUBL: 0.4000 mg | SUBLINGUAL_TABLET | SUBLINGUAL | Status: DC | PRN

## 2015-08-25 MED ORDER — METOPROLOL SUCCINATE ER 25 MG PO TB24
25.0000 mg | ORAL_TABLET | Freq: Two times a day (BID) | ORAL | Status: DC
  Administered 2015-08-25: 25 mg via ORAL
  Filled 2015-08-25 (×3): qty 1

## 2015-08-25 NOTE — Progress Notes (Addendum)
CSW received a telephone call from Jolyne Loa, [place] wanting to know patient's status. CSW informed Ms. Lowa that patient had been cleared psychiatrically. She stated she would need to speak with the Director of Nursing and call CSW back to see if patient can return. She stated she was able to view patient in EPIC. She stated she was waiting to see the psychiatry note on patient.   CSW spoke with Jolyne Loa who states patient is allowed to return to [facility]. CSW informed the Psychiatrist and NP.   Genice Rouge [id number] ED CSW 08/25/2015 12:11 PM

## 2015-08-25 NOTE — Progress Notes (Signed)
Entered in d/c instructions  Rana Snare Go on 09/08/2015 You have a scheduled appt with Dr Risa Grill, Urologist on Friday 09/08/15 at [place] [address] [phone]

## 2015-08-25 NOTE — B[facility] Suicide Risk Assessment (Signed)
Suicide Risk Assessment  Discharge Assessment   [facility] Discharge Suicide Risk Assessment   Principal Problem: Adjustment disorder with disturbance of emotion Discharge Diagnoses:  Patient Active Problem List   Diagnosis Date Noted  . Adjustment disorder with disturbance of emotion [F43.29] 08/25/2015    Priority: High  . Cellulitis, scrotum [N49.2] 08/16/2015  . Scrotal swelling [N50.89]   . Cellulitis of scrotum [N49.2] 08/15/2015  . Hyponatremia [E87.1] 08/15/2015  . AKI (acute kidney injury) ([place]) [N17.9] 08/15/2015  . Protein calorie malnutrition ([place]) [E46] 08/15/2015  . Thrombocytopenia ([place]) [D69.6] 08/15/2015  . Anemia, normocytic normochromic [D64.9] 08/15/2015  . Scrotal abscess [N49.2] 08/15/2015  . Acute coronary syndrome ([place]) [I24.9] 08/03/2015  . Atherosclerosis of native arteries of extremity with rest pain ([place]) [I70.229] 02/16/2015  . PAD (peripheral artery disease) ([place]) [I73.9] 02/16/2015  . Ischemic leg [I99.8] 02/09/2015  . Dysuria [R30.0] 08/09/2011  . Prostate cancer ([place]) [C61] 05/03/2009    Total Time spent with patient: 45 minutes  Musculoskeletal: Strength & Muscle Tone: within normal limits Gait & Station: unsteady Patient leans: N/A  Psychiatric Specialty Exam: Review of Systems  Constitutional: Negative.   HENT: Negative.   Eyes: Negative.   Respiratory: Negative.   Cardiovascular: Negative.   Gastrointestinal: Negative.   Genitourinary: Negative.   Musculoskeletal: Negative.   Skin: Negative.   Neurological: Negative.   Endo/Heme/Allergies: Negative.   Psychiatric/Behavioral: Negative.     Blood pressure 101/67, pulse 96, temperature 98.1 F (36.7 C), temperature source Oral, resp. rate 20, SpO2 98 %.There is no weight on file to calculate BMI.  General Appearance: Casual  Eye Contact::  Good  Speech:  Normal Rate  Volume:  Normal  Mood:  Euthymic  Affect:  Congruent  Thought Process:  Coherent  Orientation:  Full (Time, Place,  and Person)  Thought Content:  WDL  Suicidal Thoughts:  No  Homicidal Thoughts:  No  Memory:  Immediate;   Good Recent;   Good Remote;   Good  Judgement:  Good  Insight:  Good  Psychomotor Activity:  Normal  Concentration:  Good  Recall:  Good  Fund of Knowledge:Good  Language: Good  Akathisia:  No  Handed:  Right  AIMS (if indicated):     Assets:  Housing Leisure Time Resilience Social Support  ADL's:  Intact  Cognition: WNL  Sleep:       Mental Status Per Nursing Assessment::   On Admission:   Suicidal comment at his nursing facility  Demographic Factors:  Male and Rodney [name], lesbian, or bisexual orientation  Loss Factors: Decline in physical health  Historical Factors: NA  Risk Reduction Factors:   Sense of responsibility to family, Living with another person, especially a relative and Positive social support  Continued Clinical Symptoms:  None  Cognitive Features That Contribute To Risk:  None    Suicide Risk:  Minimal: No identifiable suicidal ideation.  Patients presenting with no risk factors but with morbid ruminations; may be classified as minimal risk based on the severity of the depressive symptoms  Follow-up Information    Follow up with [facility] S, MD. Go on 09/08/2015.   Specialty:  Urology   Why:  You have a scheduled appt with Dr Risa Grill, Urologist on Friday 09/08/15 at [place] information:   [place] [address] [phone]       Plan Of Care/Follow-up recommendations:  Activity:  as tolerated Diet:  heart healthy diet  Rodney Dannemiller, NP 08/25/2015, 11:34 AM

## 2015-08-25 NOTE — Consult Note (Signed)
[place] Psychiatry Consult   Reason for Consult:  Suicidal ideations Referring Physician:  EDP Patient Identification: Rodney [name] MRN:  [id number] Principal Diagnosis: Adjustment disorder with disturbance of emotion Diagnosis:   Patient Active Problem List   Diagnosis Date Noted  . Adjustment disorder with disturbance of emotion [F43.29] 08/25/2015    Priority: High  . Cellulitis, scrotum [N49.2] 08/16/2015  . Scrotal swelling [N50.89]   . Cellulitis of scrotum [N49.2] 08/15/2015  . Hyponatremia [E87.1] 08/15/2015  . AKI (acute kidney injury) ([employer]) [N17.9] 08/15/2015  . Protein calorie malnutrition ([place]) [E46] 08/15/2015  . Thrombocytopenia ([place]) [D69.6] 08/15/2015  . Anemia, normocytic normochromic [D64.9] 08/15/2015  . Scrotal abscess [N49.2] 08/15/2015  . Acute coronary syndrome ([place]) [I24.9] 08/03/2015  . Atherosclerosis of native arteries of extremity with rest pain ([place]) [I70.229] 02/16/2015  . PAD (peripheral artery disease) ([place]) [I73.9] 02/16/2015  . Ischemic leg [I99.8] 02/09/2015  . Dysuria [R30.0] 08/09/2011  . Prostate cancer ([place]) [C61] 05/03/2009    Total Time spent with patient: 45 minutes  Subjective:   OLANREWAJU [name] is a 65 y.o. male patient does not warrant admission.  HPI:  65 yo male who presented to the ED after stating, "Maybe someone should just shoot me", no prior psychiatric history.  He reports being frustrated at the nursing home because a device he needed was not working and no one was "paying attention, trying to get attention."  He reports making the statement but "did not mean it."  No past psychiatric history, denies suicidal/homicidal ideations, hallucinations, and alcohol/drug abuse.    Past Psychiatric History: None  Risk to Self: Suicidal Ideation: No Suicidal Intent: No Is patient at risk for suicide?: No Suicidal Plan?: No Access to Means: No What has been your use of drugs/alcohol within the last 12 months?:  (n/a) How  many times?:  (0) Other Self Harm Risks:  (n/a) Triggers for Past Attempts:  (no previous triggers ) Intentional Self Injurious Behavior: None Risk to Others: Homicidal Ideation: No Thoughts of Harm to Others: No Current Homicidal Intent: No Current Homicidal Plan: No Access to Homicidal Means: No Identified Victim:  (n/a) History of harm to others?: No Assessment of Violence: None Noted Violent Behavior Description:  (patient is calm and cooperatie ) Does patient have access to weapons?: No Criminal Charges Pending?: No Does patient have a court date: No Prior Inpatient Therapy: Prior Inpatient Therapy: No Prior Therapy Dates:  (n/a) Prior Therapy Facilty/Provider(s):  (n/a) Reason for Treatment:  (n/a) Prior Outpatient Therapy: Prior Outpatient Therapy: No Prior Therapy Dates:  (n/a) Prior Therapy Facilty/Provider(s):  (n/a) Reason for Treatment:  (n/a) Does patient have an ACCT team?: Yes Does patient have Intensive In-House Services?  : No Does patient have [facility] services? : No Does patient have P4CC services?: No  Past Medical History:  Past Medical History  Diagnosis Date  . Prostate cancer ([place]) 05/03/2009    Prostatectomy/Adenocrcinoma  . Heart murmur     history  . Cataract     left eye surgery/   . Radiation 07/02/11-08/22/11    Prostate fossa 68.4 gray 38 fractions  . Cellulitis of scrotum   . Hyponatremia   . Bullous pemphigoid   . Peripheral vascular disease ([place])   . AKI (acute kidney injury) ([place])   . Thrombocytopenia ([place])   . CAD (coronary artery disease)   . Severe malnutrition ([place])   . Alcohol dependence ([place])     Past Surgical History  Procedure Laterality Date  .  Left eye surgery    . Hemorrhoid surgery    . Robot assisted laparoscopic radical prostatectomy  05/03/2009  . Wrist surgery      left  . Left heart catheterization with coronary angiogram N/A 02/25/2012    Procedure: LEFT HEART CATHETERIZATION WITH CORONARY ANGIOGRAM;  Surgeon:  Clent Demark, MD;  Location: [facility] CATH LAB;  Service: Cardiovascular;  Laterality: N/A;  . Peripheral vascular catheterization N/A 02/10/2015    Procedure: Abdominal Aortogram;  Surgeon: Elam Dutch, MD;  Location: [place] CV LAB;  Service: Cardiovascular;  Laterality: N/A;  . Peripheral vascular catheterization Bilateral 02/10/2015    Procedure: Lower Extremity Angiography;  Surgeon: Elam Dutch, MD;  Location: [place] CV LAB;  Service: Cardiovascular;  Laterality: Bilateral;  . Endarterectomy femoral Left 02/17/2015    Procedure: LEFT FEMORAl ENDARTERECTOMY ;  Surgeon: Angelia Mould, MD;  Location: [place];  Service: Vascular;  Laterality: Left;  . Patch angioplasty Left 02/17/2015    Procedure: VEIN PATCH ANGIOPLASTY;  Surgeon: Angelia Mould, MD;  Location: [place];  Service: Vascular;  Laterality: Left;  . Cardiac catheterization N/A 08/04/2015    Procedure: Left Heart Cath and Coronary Angiography;  Surgeon: Charolette Forward, MD;  Location: [place] CV LAB;  Service: Cardiovascular;  Laterality: N/A;   Family History:  Family History  Problem Relation Age of Onset  . Uterine cancer Mother 77    going to [employer]  . Breast cancer Sister    Family Psychiatric  History: None Social History:  History  Alcohol Use  . 3.6 oz/week  . 6 Cans of beer per week    Comment: 6-7  cans drinks daily 12 oz     History  Drug Use No    Social History   Social History  . Marital Status: Widowed    Spouse Name: N/A  . Number of Children: N/A  . Years of Education: N/A   Social History Main Topics  . Smoking status: Former Smoker -- 2.00 packs/day for 36 years    Types: Cigarettes    Quit date: 12/08/2014  . Smokeless tobacco: Never Used  . Alcohol Use: 3.6 oz/week    6 Cans of beer per week     Comment: 6-7  cans drinks daily 12 oz  . Drug Use: No  . Sexual Activity: Yes     Comment: male - wife post menopausal   Other Topics Concern  . None   Social  History Narrative   Additional Social History:    Allergies:   Allergies  Allergen Reactions  . Other Rash    polyester and metals except titanium.    Labs: No results found for this or any previous visit (from the past 48 hour(s)).  Current Facility-Administered Medications  Medication Dose Route Frequency Provider Last Rate Last Dose  . aspirin EC tablet 81 mg  81 mg Oral Daily Patrecia Pour, NP   81 mg at 08/25/15 [id number]  . atorvastatin (LIPITOR) tablet 80 mg  80 mg Oral q1800 Patrecia Pour, NP      . cephALEXin [facility]) capsule 500 mg  500 mg Oral Q12H Patrecia Pour, NP   500 mg at 08/25/15 0825  . Difluprednate 0.05 % EMUL 1 drop  1 drop Right Eye BID Patrecia Pour, NP   1 drop at 08/25/15 1019  . doxycycline (VIBRA-TABS) tablet 100 mg  100 mg Oral Q12H Patrecia Pour, NP   100 mg at 08/25/15  [id number]  . HYDROcodone-acetaminophen (NORCO/VICODIN) 5-325 MG per tablet 1-2 tablet  1-2 tablet Oral Q4H PRN Patrecia Pour, NP   1 tablet at 08/25/15 0825  . metoprolol succinate (TOPROL-XL) 24 hr tablet 25 mg  25 mg Oral BID Patrecia Pour, NP   25 mg at 08/25/15 0825  . nitroGLYCERIN (NITROSTAT) SL tablet 0.4 mg  0.4 mg Sublingual Q5 Min x 3 PRN Patrecia Pour, NP      . pantoprazole (PROTONIX) EC tablet 40 mg  40 mg Oral Daily Patrecia Pour, NP   40 mg at 08/25/15 0826  . predniSONE (DELTASONE) tablet 10 mg  10 mg Oral BID WC Patrecia Pour, NP   10 mg at 08/25/15 [id number]   Current Outpatient Prescriptions  Medication Sig Dispense Refill  . aspirin EC 81 MG EC tablet Take 1 tablet (81 mg total) by mouth daily. 30 tablet 3  . atorvastatin (LIPITOR) 80 MG tablet Take 1 tablet (80 mg total) by mouth daily at 6 PM. 30 tablet 3  . cephALEXin (KEFLEX) 500 MG capsule Take 1 capsule (500 mg total) by mouth every 12 (twelve) hours. 8 capsule 0  . doxycycline (VIBRA-TABS) 100 MG tablet Take 1 tablet (100 mg total) by mouth every 12 (twelve) hours. 8 tablet 0  . DUREZOL 0.05 % EMUL Place 1 drop into  the right eye 2 (two) times daily.  0  . HYDROcodone-acetaminophen (NORCO/VICODIN) 5-325 MG tablet Take 1-2 tablets by mouth every 4 (four) hours as needed for moderate pain. (Patient taking differently: Take 1-2 tablets by mouth every 4 (four) hours as needed for moderate pain or severe pain. ) 30 tablet 0  . latanoprost (XALATAN) 0.005 % ophthalmic solution Place 1 drop into both eyes at bedtime.    . metoprolol succinate (TOPROL-XL) 25 MG 24 hr tablet Take 25 mg by mouth 2 (two) times daily.    . nitroGLYCERIN (NITROSTAT) 0.4 MG SL tablet Place 1 tablet (0.4 mg total) under the tongue every 5 (five) minutes x 3 doses as needed for chest pain. 25 tablet 3  . pantoprazole (PROTONIX) 40 MG tablet Take 1 tablet (40 mg total) by mouth daily. 30 tablet 3  . predniSONE (DELTASONE) 10 MG tablet Take 1 tablet (10 mg total) by mouth 2 (two) times daily with a meal. 30 tablet 0  . Protein (PROCEL) POWD Take 2 scoop by mouth 2 (two) times daily.    Marland Kitchen triamcinolone cream (KENALOG) 0.1 % Apply 1 application topically 3 (three) times daily. Apply to rashes on sacral area x2 weeks.      Musculoskeletal: Strength & Muscle Tone: within normal limits Gait & Station: unsteady Patient leans: N/A  Psychiatric Specialty Exam: Review of Systems  Constitutional: Negative.   HENT: Negative.   Eyes: Negative.   Respiratory: Negative.   Cardiovascular: Negative.   Gastrointestinal: Negative.   Genitourinary: Negative.   Musculoskeletal: Negative.   Skin: Negative.   Neurological: Negative.   Endo/Heme/Allergies: Negative.   Psychiatric/Behavioral: Negative.     Blood pressure 101/67, pulse 96, temperature 98.1 F (36.7 C), temperature source Oral, resp. rate 20, SpO2 98 %.There is no weight on file to calculate BMI.  General Appearance: Casual  Eye Contact::  Good  Speech:  Normal Rate  Volume:  Normal  Mood:  Euthymic  Affect:  Congruent  Thought Process:  Coherent  Orientation:  Full (Time, Place,  and Person)  Thought Content:  WDL  Suicidal Thoughts:  No  Homicidal Thoughts:  No  Memory:  Immediate;   Good Recent;   Good Remote;   Good  Judgement:  Good  Insight:  Good  Psychomotor Activity:  Normal  Concentration:  Good  Recall:  Good  Fund of Knowledge:Good  Language: Good  Akathisia:  No  Handed:  Right  AIMS (if indicated):     Assets:  Housing Leisure Time Resilience Social Support  ADL's:  Intact  Cognition: WNL  Sleep:      Treatment Plan Summary: Daily contact with patient to assess and evaluate symptoms and progress in treatment, Medication management and Plan adjustment disorder with disturbance of emotion: -Medication management:  None needed, medical medications continued -Crisis stabilization -Individual counseling  Disposition: No evidence of imminent risk to self or others at present.    Waylan Boga, NP 08/25/2015 10:56 AM Patient seen face-to-face for psychiatric evaluation, chart reviewed and case discussed with the physician extender and developed treatment plan. Reviewed the information documented and agree with the treatment plan. Corena Pilgrim, MD

## 2015-08-25 NOTE — ED Notes (Addendum)
Pt is pleasant and cooperative. Pt stated he does not want to hurt himself and never did. Pt was given am care by the night shift staff. Pt stated if he sits still he does not have scrotum pain.Pt was given a cup of coffee and stated he is ready to go back to [facility]. Pt stated he has two different skin diseases. Pt was given  1 hydrocodone for scrotum discomfort a 5/10 ( 8:30am ) He has good eye contact and denies any thoughts of self harm. Report given to [place], Librarian, academic, at [employer]. Pt will be transported via [facility] once the discharge instructions are written per MD and NP.  (10:15am )Ijnformed the supervisor at [facility] that the pt has not received his am eye drops. She stated, "we have those here. " Pts niece called , Lelan Pons, to check on the pt. [phone]. Phoned [facility] and they will pick the pt up shortly. (11;10am )Pt has no belongings to be transported . All records and discharge instructions were sent.

## 2015-08-25 NOTE — Progress Notes (Addendum)
ED CM consulted by TTS staff for referral to a specialist for issues with his scrotum ED CM reviewed EPIC notes to find pt was seen by Dr Hartley Barefoot, Urologist on 08/20/15 while hospitalized for cellulitis of scrotum. Pt from [place] spoke with pt to see if he has made f/u appts since d/c to follow up with an urologist Pt states he has been seen "just a few days ago.  I have an appointment with Dr Risa Grill in three weeks" September 08 2015 at 25 confirmed by Dr Risa Grill staff at [phone] as the date and time of his f/u appt

## 2015-08-31 ENCOUNTER — Inpatient Hospital Stay: Admit: 2015-08-31 | Payer: Self-pay | Admitting: Urology

## 2015-08-31 ENCOUNTER — Emergency Department (HOSPITAL_COMMUNITY): Payer: 59 | Admitting: Anesthesiology

## 2015-08-31 ENCOUNTER — Encounter (HOSPITAL_COMMUNITY)
Admission: EM | Disposition: A | Discharge status: Nursing Home (Includes ICF) | Payer: Self-pay | Source: Home / Self Care | Attending: Internal Medicine

## 2015-08-31 ENCOUNTER — Emergency Department (HOSPITAL_COMMUNITY): Payer: 59

## 2015-08-31 ENCOUNTER — Inpatient Hospital Stay (HOSPITAL_COMMUNITY)
Admission: EM | Admit: 2015-08-31 | Discharge: 2015-09-08 | DRG: 717 | Disposition: A | Discharge status: Other Acute Inpatient Hospital | Payer: 59 | Attending: Internal Medicine | Admitting: Internal Medicine

## 2015-08-31 ENCOUNTER — Encounter (HOSPITAL_COMMUNITY): Payer: Self-pay | Admitting: Emergency Medicine

## 2015-08-31 DIAGNOSIS — C61 Malignant neoplasm of prostate: Secondary | ICD-10-CM | POA: Diagnosis present

## 2015-08-31 DIAGNOSIS — D899 Disorder involving the immune mechanism, unspecified: Secondary | ICD-10-CM | POA: Diagnosis present

## 2015-08-31 DIAGNOSIS — B9689 Other specified bacterial agents as the cause of diseases classified elsewhere: Secondary | ICD-10-CM | POA: Diagnosis not present

## 2015-08-31 DIAGNOSIS — D72829 Elevated white blood cell count, unspecified: Secondary | ICD-10-CM | POA: Diagnosis present

## 2015-08-31 DIAGNOSIS — Z7982 Long term (current) use of aspirin: Secondary | ICD-10-CM

## 2015-08-31 DIAGNOSIS — Z923 Personal history of irradiation: Secondary | ICD-10-CM | POA: Diagnosis not present

## 2015-08-31 DIAGNOSIS — I2510 Atherosclerotic heart disease of native coronary artery without angina pectoris: Secondary | ICD-10-CM | POA: Diagnosis present

## 2015-08-31 DIAGNOSIS — N50819 Testicular pain, unspecified: Secondary | ICD-10-CM | POA: Diagnosis present

## 2015-08-31 DIAGNOSIS — N492 Inflammatory disorders of scrotum: Secondary | ICD-10-CM | POA: Diagnosis present

## 2015-08-31 DIAGNOSIS — E860 Dehydration: Secondary | ICD-10-CM | POA: Diagnosis present

## 2015-08-31 DIAGNOSIS — I739 Peripheral vascular disease, unspecified: Secondary | ICD-10-CM | POA: Diagnosis present

## 2015-08-31 DIAGNOSIS — Z87891 Personal history of nicotine dependence: Secondary | ICD-10-CM | POA: Diagnosis not present

## 2015-08-31 DIAGNOSIS — E785 Hyperlipidemia, unspecified: Secondary | ICD-10-CM | POA: Diagnosis present

## 2015-08-31 DIAGNOSIS — E46 Unspecified protein-calorie malnutrition: Secondary | ICD-10-CM | POA: Diagnosis present

## 2015-08-31 DIAGNOSIS — L120 Bullous pemphigoid: Secondary | ICD-10-CM | POA: Diagnosis present

## 2015-08-31 DIAGNOSIS — E876 Hypokalemia: Secondary | ICD-10-CM | POA: Diagnosis present

## 2015-08-31 DIAGNOSIS — N493 Fournier gangrene: Principal | ICD-10-CM | POA: Diagnosis present

## 2015-08-31 DIAGNOSIS — Z7952 Long term (current) use of systemic steroids: Secondary | ICD-10-CM | POA: Diagnosis not present

## 2015-08-31 DIAGNOSIS — Z9079 Acquired absence of other genital organ(s): Secondary | ICD-10-CM | POA: Diagnosis not present

## 2015-08-31 DIAGNOSIS — F17210 Nicotine dependence, cigarettes, uncomplicated: Secondary | ICD-10-CM

## 2015-08-31 DIAGNOSIS — D62 Acute posthemorrhagic anemia: Secondary | ICD-10-CM | POA: Diagnosis present

## 2015-08-31 DIAGNOSIS — R197 Diarrhea, unspecified: Secondary | ICD-10-CM | POA: Diagnosis present

## 2015-08-31 DIAGNOSIS — A480 Gas gangrene: Secondary | ICD-10-CM | POA: Diagnosis present

## 2015-08-31 DIAGNOSIS — Z955 Presence of coronary angioplasty implant and graft: Secondary | ICD-10-CM | POA: Diagnosis not present

## 2015-08-31 DIAGNOSIS — R000 Tachycardia, unspecified: Secondary | ICD-10-CM | POA: Diagnosis present

## 2015-08-31 DIAGNOSIS — Z8546 Personal history of malignant neoplasm of prostate: Secondary | ICD-10-CM

## 2015-08-31 DIAGNOSIS — F1910 Other psychoactive substance abuse, uncomplicated: Secondary | ICD-10-CM | POA: Diagnosis not present

## 2015-08-31 DIAGNOSIS — E43 Unspecified severe protein-calorie malnutrition: Secondary | ICD-10-CM | POA: Diagnosis present

## 2015-08-31 DIAGNOSIS — N5089 Other specified disorders of the male genital organs: Secondary | ICD-10-CM | POA: Diagnosis present

## 2015-08-31 DIAGNOSIS — I10 Essential (primary) hypertension: Secondary | ICD-10-CM | POA: Diagnosis present

## 2015-08-31 DIAGNOSIS — Z79899 Other long term (current) drug therapy: Secondary | ICD-10-CM | POA: Diagnosis not present

## 2015-08-31 DIAGNOSIS — R001 Bradycardia, unspecified: Secondary | ICD-10-CM | POA: Diagnosis present

## 2015-08-31 DIAGNOSIS — L0390 Cellulitis, unspecified: Secondary | ICD-10-CM

## 2015-08-31 DIAGNOSIS — D649 Anemia, unspecified: Secondary | ICD-10-CM | POA: Diagnosis not present

## 2015-08-31 DIAGNOSIS — K5900 Constipation, unspecified: Secondary | ICD-10-CM | POA: Diagnosis present

## 2015-08-31 DIAGNOSIS — B3749 Other urogenital candidiasis: Secondary | ICD-10-CM | POA: Diagnosis not present

## 2015-08-31 DIAGNOSIS — R0902 Hypoxemia: Secondary | ICD-10-CM

## 2015-08-31 HISTORY — DX: Essential (primary) hypertension: I10

## 2015-08-31 HISTORY — PX: IRRIGATION AND DEBRIDEMENT ABSCESS: SHX5252

## 2015-08-31 LAB — BASIC METABOLIC PANEL
Anion gap: 7 (ref 5–15)
BUN: 28 mg/dL — ABNORMAL HIGH (ref 6–20)
CO2: 27 mmol/L (ref 22–32)
Calcium: 8.9 mg/dL (ref 8.9–10.3)
Chloride: 105 mmol/L (ref 101–111)
Creatinine, Ser: 0.91 mg/dL (ref 0.61–1.24)
GFR calc Af Amer: >60 mL/min (ref >60)
GFR calc non Af Amer: >60 mL/min (ref >60)
Glucose, Bld: 155 mg/dL — ABNORMAL HIGH (ref 65–99)
Potassium: 4 mmol/L (ref 3.5–5.1)
Sodium: 139 mmol/L (ref 135–145)

## 2015-08-31 LAB — CBC WITH DIFFERENTIAL/PLATELET
Basophils Absolute: 0 10*3/uL (ref 0.0–0.1)
Basophils Relative: 0 %
Eosinophils Absolute: 0 10*3/uL (ref 0.0–0.7)
Eosinophils Relative: 0 %
HCT: 30.1 % — ABNORMAL LOW (ref 39.0–52.0)
Hemoglobin: 9.7 g/dL — ABNORMAL LOW (ref 13.0–17.0)
Lymphocytes Relative: 6 %
Lymphs Abs: 0.6 10*3/uL — ABNORMAL LOW (ref 0.7–4.0)
MCH: 31.7 pg (ref 26.0–34.0)
MCHC: 32.2 g/dL (ref 30.0–36.0)
MCV: 98.4 fL (ref 78.0–100.0)
Monocytes Absolute: 0.5 10*3/uL (ref 0.1–1.0)
Monocytes Relative: 4 %
Neutro Abs: 9.5 10*3/uL — ABNORMAL HIGH (ref 1.7–7.7)
Neutrophils Relative %: 90 %
Platelets: 367 10*3/uL (ref 150–400)
RBC: 3.06 MIL/uL — ABNORMAL LOW (ref 4.22–5.81)
RDW: 15 % (ref 11.5–15.5)
WBC: 10.6 10*3/uL — ABNORMAL HIGH (ref 4.0–10.5)

## 2015-08-31 LAB — I-STAT CG4 LACTIC ACID, ED: Lactic Acid, Venous: 1.31 mmol/L (ref 0.5–2.0)

## 2015-08-31 LAB — I-STAT CHEM 8, ED
BUN: 26 mg/dL — ABNORMAL HIGH (ref 6–20)
Calcium, Ion: 1.2 mmol/L (ref 1.13–1.30)
Chloride: 101 mmol/L (ref 101–111)
Creatinine, Ser: 0.9 mg/dL (ref 0.61–1.24)
Glucose, Bld: 150 mg/dL — ABNORMAL HIGH (ref 65–99)
HCT: 30 % — ABNORMAL LOW (ref 39.0–52.0)
Hemoglobin: 10.2 g/dL — ABNORMAL LOW (ref 13.0–17.0)
Potassium: 3.9 mmol/L (ref 3.5–5.1)
Sodium: 138 mmol/L (ref 135–145)
TCO2: 27 mmol/L (ref 0–100)

## 2015-08-31 LAB — PREPARE RBC (CROSSMATCH)

## 2015-08-31 SURGERY — IRRIGATION AND DEBRIDEMENT ABSCESS
Anesthesia: General

## 2015-08-31 MED ORDER — SODIUM CHLORIDE 0.9 % IV SOLN
10.0000 mL/h | Freq: Once | INTRAVENOUS | Status: AC
  Administered 2015-09-01: 10 mL/h via INTRAVENOUS

## 2015-08-31 MED ORDER — SUCCINYLCHOLINE CHLORIDE 20 MG/ML IJ SOLN
INTRAMUSCULAR | Status: DC | PRN
  Administered 2015-08-31: 100 mg via INTRAVENOUS

## 2015-08-31 MED ORDER — MIDAZOLAM HCL 2 MG/2ML IJ SOLN: 0.5000 mg | Freq: Once | INTRAMUSCULAR | Status: DC | PRN

## 2015-08-31 MED ORDER — PIPERACILLIN-TAZOBACTAM 3.375 G IVPB
3.3750 g | Freq: Once | INTRAVENOUS | Status: AC
  Administered 2015-08-31: 3.375 g via INTRAVENOUS

## 2015-08-31 MED ORDER — LIDOCAINE HCL (CARDIAC) 20 MG/ML IV SOLN
INTRAVENOUS | Status: AC
  Filled 2015-08-31: qty 5

## 2015-08-31 MED ORDER — VANCOMYCIN HCL IN DEXTROSE 750-5 MG/150ML-% IV SOLN
750.0000 mg | Freq: Two times a day (BID) | INTRAVENOUS | Status: DC
  Administered 2015-09-01 – 2015-09-04 (×7): 750 mg via INTRAVENOUS
  Filled 2015-08-31 (×9): qty 150

## 2015-08-31 MED ORDER — SODIUM CHLORIDE 0.9 % IJ SOLN
INTRAMUSCULAR | Status: AC
  Filled 2015-08-31: qty 10

## 2015-08-31 MED ORDER — ROCURONIUM BROMIDE 100 MG/10ML IV SOLN
INTRAVENOUS | Status: DC | PRN
  Administered 2015-08-31: 30 mg via INTRAVENOUS

## 2015-08-31 MED ORDER — KETAMINE HCL 10 MG/ML IJ SOLN
INTRAMUSCULAR | Status: AC
  Filled 2015-08-31: qty 1

## 2015-08-31 MED ORDER — LIDOCAINE HCL (CARDIAC) 20 MG/ML IV SOLN
INTRAVENOUS | Status: DC | PRN
  Administered 2015-08-31: 25 mg via INTRAVENOUS

## 2015-08-31 MED ORDER — PROPOFOL 10 MG/ML IV BOLUS
INTRAVENOUS | Status: AC
  Filled 2015-08-31: qty 20

## 2015-08-31 MED ORDER — MORPHINE SULFATE (PF) 4 MG/ML IV SOLN
4.0000 mg | Freq: Once | INTRAVENOUS | Status: DC
  Filled 2015-08-31: qty 1

## 2015-08-31 MED ORDER — KETAMINE HCL 10 MG/ML IJ SOLN
INTRAMUSCULAR | Status: DC | PRN
  Administered 2015-08-31: 30 mg via INTRAVENOUS

## 2015-08-31 MED ORDER — SUFENTANIL CITRATE 50 MCG/ML IV SOLN
INTRAVENOUS | Status: DC | PRN
  Administered 2015-08-31: 5 ug via INTRAVENOUS
  Administered 2015-08-31: 15 ug via INTRAVENOUS
  Administered 2015-08-31 (×2): 10 ug via INTRAVENOUS

## 2015-08-31 MED ORDER — SUGAMMADEX SODIUM 200 MG/2ML IV SOLN
INTRAVENOUS | Status: DC | PRN
  Administered 2015-08-31: 200 mg via INTRAVENOUS

## 2015-08-31 MED ORDER — PHENYLEPHRINE HCL 10 MG/ML IJ SOLN
INTRAMUSCULAR | Status: AC
  Filled 2015-08-31: qty 1

## 2015-08-31 MED ORDER — PIPERACILLIN-TAZOBACTAM 3.375 G IVPB
INTRAVENOUS | Status: AC
  Filled 2015-08-31: qty 50

## 2015-08-31 MED ORDER — HYDROMORPHONE HCL 1 MG/ML IJ SOLN
0.2500 mg | INTRAMUSCULAR | Status: DC | PRN
  Administered 2015-08-31 (×2): 0.5 mg via INTRAVENOUS

## 2015-08-31 MED ORDER — ROCURONIUM BROMIDE 100 MG/10ML IV SOLN
INTRAVENOUS | Status: AC
  Filled 2015-08-31: qty 1

## 2015-08-31 MED ORDER — SODIUM CHLORIDE 0.9 % IV SOLN
10.0000 mg | INTRAVENOUS | Status: DC | PRN
  Administered 2015-08-31: 50 ug/min via INTRAVENOUS

## 2015-08-31 MED ORDER — ONDANSETRON HCL 4 MG/2ML IJ SOLN
INTRAMUSCULAR | Status: AC
  Filled 2015-08-31: qty 2

## 2015-08-31 MED ORDER — SUFENTANIL CITRATE 50 MCG/ML IV SOLN
INTRAVENOUS | Status: AC
  Filled 2015-08-31: qty 1

## 2015-08-31 MED ORDER — LACTATED RINGERS IV SOLN
INTRAVENOUS | Status: DC | PRN
  Administered 2015-08-31 (×2): via INTRAVENOUS

## 2015-08-31 MED ORDER — PROPOFOL 10 MG/ML IV BOLUS
INTRAVENOUS | Status: DC | PRN
  Administered 2015-08-31: 100 mg via INTRAVENOUS

## 2015-08-31 MED ORDER — SUGAMMADEX SODIUM 200 MG/2ML IV SOLN
INTRAVENOUS | Status: AC
  Filled 2015-08-31: qty 2

## 2015-08-31 MED ORDER — HYDROCORTISONE NA SUCCINATE PF 100 MG IJ SOLR
INTRAMUSCULAR | Status: AC
  Filled 2015-08-31: qty 2

## 2015-08-31 MED ORDER — EPHEDRINE SULFATE 50 MG/ML IJ SOLN
INTRAMUSCULAR | Status: AC
  Filled 2015-08-31: qty 1

## 2015-08-31 MED ORDER — ALBUMIN HUMAN 5 % IV SOLN
INTRAVENOUS | Status: AC
  Filled 2015-08-31: qty 500

## 2015-08-31 MED ORDER — MEPERIDINE HCL 50 MG/ML IJ SOLN: 6.2500 mg | INTRAMUSCULAR | Status: DC | PRN

## 2015-08-31 MED ORDER — VANCOMYCIN HCL IN DEXTROSE 1-5 GM/200ML-% IV SOLN
1000.0000 mg | INTRAVENOUS | Status: AC
  Administered 2015-08-31: 1000 mg via INTRAVENOUS
  Filled 2015-08-31: qty 200

## 2015-08-31 MED ORDER — SODIUM CHLORIDE 0.9 % IV SOLN
INTRAVENOUS | Status: DC | PRN
  Administered 2015-08-31: 19:00:00 via INTRAVENOUS

## 2015-08-31 MED ORDER — HYDROMORPHONE HCL 1 MG/ML IJ SOLN
INTRAMUSCULAR | Status: AC
  Filled 2015-08-31: qty 1

## 2015-08-31 SURGICAL SUPPLY — 37 items
BLADE HEX COATED 2.75 (ELECTRODE) ×2 IMPLANT
BLADE SURG SZ10 CARB STEEL (BLADE) IMPLANT
COVER SURGICAL LIGHT HANDLE (MISCELLANEOUS) ×2 IMPLANT
DECANTER SPIKE VIAL GLASS SM (MISCELLANEOUS) IMPLANT
DRAPE LAPAROSCOPIC ABDOMINAL (DRAPES) IMPLANT
DRAPE LAPAROTOMY T 102X78X121 (DRAPES) IMPLANT
DRAPE LAPAROTOMY TRNSV 102X78 (DRAPE) IMPLANT
DRAPE SHEET LG 3/4 BI-LAMINATE (DRAPES) IMPLANT
ELECT PENCIL ROCKER SW 15FT (MISCELLANEOUS) IMPLANT
ELECT REM PT RETURN 9FT ADLT (ELECTROSURGICAL) ×2
ELECTRODE REM PT RTRN 9FT ADLT (ELECTROSURGICAL) ×1 IMPLANT
GAUZE SPONGE 4X4 12PLY STRL (GAUZE/BANDAGES/DRESSINGS) IMPLANT
GLOVE BIO SURGEON STRL SZ7 (GLOVE) ×6 IMPLANT
GLOVE BIOGEL M 8.0 STRL (GLOVE) ×2 IMPLANT
GLOVE BIOGEL PI IND STRL 7.0 (GLOVE) ×1 IMPLANT
GLOVE BIOGEL PI IND STRL 7.5 (GLOVE) ×3 IMPLANT
GLOVE BIOGEL PI INDICATOR 7.0 (GLOVE) ×1
GLOVE BIOGEL PI INDICATOR 7.5 (GLOVE) ×3
GOWN STRL REUS W/ TWL XL LVL3 (GOWN DISPOSABLE) ×2 IMPLANT
GOWN STRL REUS W/TWL LRG LVL3 (GOWN DISPOSABLE) ×4 IMPLANT
GOWN STRL REUS W/TWL XL LVL3 (GOWN DISPOSABLE) ×4 IMPLANT
KIT BASIN OR (CUSTOM PROCEDURE TRAY) ×4 IMPLANT
LIQUID BAND (GAUZE/BANDAGES/DRESSINGS) IMPLANT
MARKER SKIN DUAL TIP RULER LAB (MISCELLANEOUS) IMPLANT
NEEDLE HYPO 25X1 1.5 SAFETY (NEEDLE) IMPLANT
NS IRRIG 1000ML POUR BTL (IV SOLUTION) ×2 IMPLANT
PACK BASIC VI WITH GOWN DISP (CUSTOM PROCEDURE TRAY) IMPLANT
PACK GENERAL/GYN (CUSTOM PROCEDURE TRAY) ×2 IMPLANT
SHEET LAVH (DRAPES) ×2 IMPLANT
SPONGE LAP 18X18 X RAY DECT (DISPOSABLE) IMPLANT
SPONGE LAP 4X18 X RAY DECT (DISPOSABLE) IMPLANT
STAPLER VISISTAT 35W (STAPLE) IMPLANT
SUT MNCRL AB 4-0 PS2 18 (SUTURE) IMPLANT
SUT VIC AB 3-0 SH 18 (SUTURE) IMPLANT
SYR CONTROL 10ML LL (SYRINGE) IMPLANT
TOWEL OR 17X26 10 PK STRL BLUE (TOWEL DISPOSABLE) ×2 IMPLANT
YANKAUER SUCT BULB TIP 10FT TU (MISCELLANEOUS) IMPLANT

## 2015-08-31 NOTE — Progress Notes (Addendum)
Pharmacy Antibiotic Note  Rodney [name] is a 65 y.o. male admitted on 08/31/2015 with scrotal cellulitis, Fournier's Gangrene.  PMH is significant for recent scrotal cellulitis requiring hospital admission (3/14-3/21) where he received vancomycin and cefepime (3/14-3/17) then was changed to doxycyline and cephalexin; he was discharged on 3/21 with 4 additional days of doxycycline and cephalexin to complete 10 days of therapy.  Pharmacy has been consulted for Vancomycin dosing.  Plan:  Vancomycin 1g IV once, then 750 mg IV q12h.   Measure Vanc trough at steady state.  Goal VT 15-20.  Follow up renal fxn, culture results, and clinical course.  Weight: 134 lb (60.782 kg)  Temp (24hrs), Avg:98.3 F (36.8 C), Min:98.3 F (36.8 C), Max:98.3 F (36.8 C)   Recent Labs Lab 08/31/15 1617 08/31/15 1629 08/31/15 1630  WBC 10.6*  --   --   CREATININE 0.91 0.90  --   LATICACIDVEN  --   --  1.31    Estimated Creatinine Clearance: 72.2 mL/min (by C-G formula based on Cr of 0.9).    Allergies  Allergen Reactions  . Other Rash    polyester and metals except titanium.    Antimicrobials this admission: 3/30 Vancomycin >>  3/30 Zosyn x1 dose  Dose adjustments this admission:  Microbiology results:  Thank you for allowing pharmacy to be a part of this patient's care.  Gretta Arab PharmD, BCPS Pager [phone] 08/31/2015 4:54 PM

## 2015-08-31 NOTE — Anesthesia Procedure Notes (Signed)
Procedure Name: Intubation Date/Time: 08/31/2015 7:13 PM Performed by: Sharlette Dense Pre-anesthesia Checklist: Patient identified Patient Re-evaluated:Patient Re-evaluated prior to inductionOxygen Delivery Method: Circle system utilized Preoxygenation: Pre-oxygenation with 100% oxygen Intubation Type: IV induction, Rapid sequence and Cricoid Pressure applied Laryngoscope Size: Miller and 3 Grade View: Grade I Tube type: Subglottic suction tube Tube size: 8.0 mm Number of attempts: 1 Airway Equipment and Method: Stylet Placement Confirmation: ETT inserted through vocal cords under direct vision,  positive ETCO2 and breath sounds checked- equal and bilateral Secured at: 21 cm Tube secured with: Tape Dental Injury: Teeth and Oropharynx as per pre-operative assessment

## 2015-08-31 NOTE — H&P (Signed)
H&P  Chief Complaint: Fournier's Gangrene  History of Present Illness: Rodney [name] is a 65 y.o. year old male with a h/o prostate cancer s/p radiation, CAD, PVD s/p endarterectomy and scrotal erythema with progression to major infection with sloughing of scrotal skin, spreading erythema and gas on CT. The patient was being cared for in a SNF who noted progression today and transferred him to the [facility] ED. The patient has been afebrile with a normal white count and a normal lactate.  Past Medical History  Diagnosis Date  . Prostate cancer ([place]) 05/03/2009    Prostatectomy/Adenocrcinoma  . Heart murmur     history  . Cataract     left eye surgery/   . Radiation 07/02/11-08/22/11    Prostate fossa 68.4 gray 38 fractions  . Cellulitis of scrotum   . Hyponatremia   . Bullous pemphigoid   . Peripheral vascular disease ([place])   . AKI (acute kidney injury) ([place])   . Thrombocytopenia ([place])   . CAD (coronary artery disease)   . Severe malnutrition ([place])   . Alcohol dependence ([place])     Past Surgical History  Procedure Laterality Date  . Left eye surgery    . Hemorrhoid surgery    . Robot assisted laparoscopic radical prostatectomy  05/03/2009  . Wrist surgery      left  . Left heart catheterization with coronary angiogram N/A 02/25/2012    Procedure: LEFT HEART CATHETERIZATION WITH CORONARY ANGIOGRAM;  Surgeon: Clent Demark, MD;  Location: [facility] CATH LAB;  Service: Cardiovascular;  Laterality: N/A;  . Peripheral vascular catheterization N/A 02/10/2015    Procedure: Abdominal Aortogram;  Surgeon: Elam Dutch, MD;  Location: [place] CV LAB;  Service: Cardiovascular;  Laterality: N/A;  . Peripheral vascular catheterization Bilateral 02/10/2015    Procedure: Lower Extremity Angiography;  Surgeon: Elam Dutch, MD;  Location: [place] CV LAB;  Service: Cardiovascular;  Laterality: Bilateral;  . Endarterectomy femoral Left 02/17/2015    Procedure: LEFT FEMORAl ENDARTERECTOMY ;  Surgeon:  Angelia Mould, MD;  Location: [place];  Service: Vascular;  Laterality: Left;  . Patch angioplasty Left 02/17/2015    Procedure: VEIN PATCH ANGIOPLASTY;  Surgeon: Angelia Mould, MD;  Location: [place];  Service: Vascular;  Laterality: Left;  . Cardiac catheterization N/A 08/04/2015    Procedure: Left Heart Cath and Coronary Angiography;  Surgeon: Charolette Forward, MD;  Location: [place] CV LAB;  Service: Cardiovascular;  Laterality: N/A;    Home Medications:    Medication List    ASK your doctor about these medications        aspirin 81 MG EC tablet  Take 1 tablet (81 mg total) by mouth daily.     atorvastatin 80 MG tablet  Commonly known as:  LIPITOR  Take 1 tablet (80 mg total) by mouth daily at 6 PM.     cephALEXin 500 MG capsule  Commonly known as:  KEFLEX  Take 1 capsule (500 mg total) by mouth every 12 (twelve) hours.     doxycycline 100 MG tablet  Commonly known as:  VIBRA-TABS  Take 1 tablet (100 mg total) by mouth every 12 (twelve) hours.     DUREZOL 0.05 % Emul  Generic drug:  Difluprednate  Place 1 drop into the right eye 2 (two) times daily.     HYDROcodone-acetaminophen 5-325 MG tablet  Commonly known as:  NORCO/VICODIN  Take 1-2 tablets by mouth every 4 (four) hours as needed for moderate pain.  latanoprost 0.005 % ophthalmic solution  Commonly known as:  XALATAN  Place 1 drop into both eyes at bedtime.     metoprolol succinate 25 MG 24 hr tablet  Commonly known as:  TOPROL-XL  Take 25 mg by mouth 2 (two) times daily.     nitroGLYCERIN 0.4 MG SL tablet  Commonly known as:  NITROSTAT  Place 1 tablet (0.4 mg total) under the tongue every 5 (five) minutes x 3 doses as needed for chest pain.     pantoprazole 40 MG tablet  Commonly known as:  PROTONIX  Take 1 tablet (40 mg total) by mouth daily.     predniSONE 10 MG tablet  Commonly known as:  DELTASONE  Take 1 tablet (10 mg total) by mouth 2 (two) times daily with a meal.     PROCEL Powd   Take 2 scoop by mouth 2 (two) times daily.     triamcinolone cream 0.1 %  Commonly known as:  KENALOG  Apply 1 application topically 3 (three) times daily. Apply to rashes on sacral area x2 weeks.        Allergies:  Allergies  Allergen Reactions  . Other Rash    polyester and metals except titanium.    Family History  Problem Relation Age of Onset  . Uterine cancer Mother 53    going to [employer]  . Breast cancer Sister     Social History:  reports that he quit smoking about 8 months ago. His smoking use included Cigarettes. He has a 72 pack-year smoking history. He has never used smokeless tobacco. He reports that he drinks about 3.6 oz of alcohol per week. He reports that he does not use illicit drugs.  ROS: A complete review of systems was performed.  All systems are negative except for pertinent findings as noted.  Physical Exam:  Vital signs in last 24 hours: Temp:  [98.3 F (36.8 C)] 98.3 F (36.8 C) (03/30 1507) Pulse Rate:  [85-98] 85 (03/30 1715) Resp:  [18-20] 20 (03/30 1715) BP: (102-118)/(71-82) 118/82 mmHg (03/30 1715) SpO2:  [100 %] 100 % (03/30 1715) Weight:  [60.782 kg (134 lb)] 60.782 kg (134 lb) (03/30 1615) Constitutional:  Alert and oriented, No acute distress Cardiovascular: Regular rate and rhythm, No JVD Respiratory: Normal respiratory effort, Lungs clear bilaterally GI: Abdomen is soft, nontender, nondistended, spreading erythema to the suprapubic region without noticeable crepitus. GU: Large area scrotal infection with sloughing of scrotal skin, the fascia appears intact but is largely exposed. Elevating scrotum to look at perineum caused sloughing of the scrotal skin. Perineum: spreading erythema Neurologic: Grossly intact, no focal deficits Psychiatric: Normal mood and affect   Laboratory Data:   Recent Labs  08/31/15 1617 08/31/15 1629  WBC 10.6*  --   HGB 9.7* 10.2*  HCT 30.1* 30.0*  PLT 367  --      Recent Labs   08/31/15 1617 08/31/15 1629  NA 139 138  K 4.0 3.9  CL 105 101  GLUCOSE 155* 150*  BUN 28* 26*  CALCIUM 8.9  --   CREATININE 0.91 0.90     Results for orders placed or performed during the hospital encounter of 08/31/15 (from the past 24 hour(s))  Basic metabolic panel     Status: Abnormal   Collection Time: 08/31/15  4:17 PM  Result Value Ref Range   Sodium 139 135 - 145 mmol/L   Potassium 4.0 3.5 - 5.1 mmol/L   Chloride 105 101 - 111 mmol/L  CO2 27 22 - 32 mmol/L   Glucose, Bld 155 (H) 65 - 99 mg/dL   BUN 28 (H) 6 - 20 mg/dL   Creatinine, Ser 0.91 0.61 - 1.24 mg/dL   Calcium 8.9 8.9 - 10.3 mg/dL   GFR calc non Af Amer >60 >60 mL/min   GFR calc Af Amer >60 >60 mL/min   Anion gap 7 5 - 15  CBC with Differential     Status: Abnormal   Collection Time: 08/31/15  4:17 PM  Result Value Ref Range   WBC 10.6 (H) 4.0 - 10.5 K/uL   RBC 3.06 (L) 4.22 - 5.81 MIL/uL   Hemoglobin 9.7 (L) 13.0 - 17.0 g/dL   HCT 30.1 (L) 39.0 - 52.0 %   MCV 98.4 78.0 - 100.0 fL   MCH 31.7 26.0 - 34.0 pg   MCHC 32.2 30.0 - 36.0 g/dL   RDW 15.0 11.5 - 15.5 %   Platelets 367 150 - 400 K/uL   Neutrophils Relative % 90 %   Neutro Abs 9.5 (H) 1.7 - 7.7 K/uL   Lymphocytes Relative 6 %   Lymphs Abs 0.6 (L) 0.7 - 4.0 K/uL   Monocytes Relative 4 %   Monocytes Absolute 0.5 0.1 - 1.0 K/uL   Eosinophils Relative 0 %   Eosinophils Absolute 0.0 0.0 - 0.7 K/uL   Basophils Relative 0 %   Basophils Absolute 0.0 0.0 - 0.1 K/uL  I-stat Chem 8, ED     Status: Abnormal   Collection Time: 08/31/15  4:29 PM  Result Value Ref Range   Sodium 138 135 - 145 mmol/L   Potassium 3.9 3.5 - 5.1 mmol/L   Chloride 101 101 - 111 mmol/L   BUN 26 (H) 6 - 20 mg/dL   Creatinine, Ser 0.90 0.61 - 1.24 mg/dL   Glucose, Bld 150 (H) 65 - 99 mg/dL   Calcium, Ion 1.20 1.13 - 1.30 mmol/L   TCO2 27 0 - 100 mmol/L   Hemoglobin 10.2 (L) 13.0 - 17.0 g/dL   HCT 30.0 (L) 39.0 - 52.0 %  I-Stat CG4 Lactic Acid, ED     Status: None    Collection Time: 08/31/15  4:30 PM  Result Value Ref Range   Lactic Acid, Venous 1.31 0.5 - 2.0 mmol/L   No results found for this or any previous visit (from the past 240 hour(s)).  Renal Function:  Recent Labs  08/31/15 1617 08/31/15 1629  CREATININE 0.91 0.90   Estimated Creatinine Clearance: 72.2 mL/min (by C-G formula based on Cr of 0.9).  Radiologic Imaging: Ct Pelvis Wo Contrast  08/31/2015  CLINICAL DATA:  65 year old male with severe scrotal pain. Increased pain with gangrenous tissue today. Initial encounter. EXAM: CT PELVIS WITHOUT CONTRAST TECHNIQUE: Multidetector CT imaging of the pelvis was performed following the standard protocol without intravenous contrast. COMPARISON:  CT Abdomen and Pelvis 08/15/2015. FINDINGS: New widespread subcutaneous gas throughout the scrotum and tracking cephalad to the suprapubic space and inferior rectus muscles. No pelvic floor involvement. No retroperitoneal involvement. There is an unchanged simple appearing circumscribed fluid collection in the medial left thigh. Extensive Aortoiliac calcified atherosclerosis noted. Calcified plaque continues into the bilateral femoral arteries, worse on the right. No pelvic free fluid. Stool ball in the rectum. Small volume of gas in the urinary bladder. Retained stool in the distal colon. Negative visualized distal small bowel. Negative appendix and visible cecum. No pneumoperitoneum. Osteopenia.   No acute osseous abnormality identified. IMPRESSION: 1. Positive for Fournier's gangrene  necrotizing fasciitis. Subcutaneous gas tracks from the scrotum cephalad to the superficial suprapubic soft tissues an inferior rectus muscles. This critical value/emergent finding was discussed by telephone with CHRISTOPHER LAWYER on 08/31/2015 at 17:02 . 2. Small volume of gas within the lumen of the urinary bladder. 3. No pelvic floor or retroperitoneal inflammation. Negative visualized lower abdominal viscera. 4. Severe calcified  atherosclerosis of the aorta, pelvic arteries and femoral arteries. Electronically Signed   By: Genevie Ann M.D.   On: 08/31/2015 17:04    Impression/Assessment:  65 y.o. male with fournier's gangrene  Plan:  - OR for emergent debriedment, r/b discussed including death, loss of testicles, need for SP tube or colostomy, need for multiple OR visits, bleeding, ICU stay  Christell Faith 08/31/2015, 6:04 PM       Patient was seen, examined,treatment plan was discussed with the resident.  I have directly reviewed the clinical findings, lab, imaging studies and management of this patient in detail. I have made the necessary changes and/or additions to the above noted documentation, and agree with the documentation, as recorded by the resident.

## 2015-08-31 NOTE — ED Notes (Signed)
Pt transferred to OR via stretcher with OR staff, Dominica Severin. Pt only had 1 black cellular phone that is locked up with security. Key and yellow paper with patient.

## 2015-08-31 NOTE — Op Note (Signed)
1.  Progress note or procedure note with a detailed description of the procedure. Preoperative diagnosis: necrotizing soft tissue infection Postoperative diagnosis: saa Procedure: debridement of skin and soft tissue abdominal wall 25x13 cm Surgeon: Dr Serita Grammes Cosurgeon: Dr Kathie Rhodes Anes: general EBL: 100cc Drains none Complications none Specimens; tissue for culture Sponge count correct dispo to recovery stable  Indications: this is a 67 yom who presents with a necrotizing soft tissue infection of the perineum, scrotum and abdominal wall.  He was scheduled by Dr Karsten Ro who asked me to see him and participate in OR He has history of prednisone usage. Procedure: After informed consent was obtained he was taken to OR. He was given antibiotics and had scds on.  He was placed under general anesthesia without complication.  He was placed in lithotomy and appropriately padded.  He was prepped and draped in the standard sterile surgical fashion.  A timeout was performed  See urology dictation for details.  His abdominal wall had erythema and I removed a 25 x 13 cm area of skin and soft tissue down to healthy fascia.  This eventually was packed with moist kerlix.  He later was transferred to recovery stable.  2.  Tool used for debridement (curette, scapel, etc.)  Scissors, cautery  3.  Frequency of surgical debridement.   Once will return to or in about 48 hours  4.  Measurement of total devitalized tissue (wound surface) before and after surgical debridement.   25x13 cm  5.  Area and depth of devitalized tissue removed from wound.  25 x 13 cm depth 2 cm  6.  Blood loss and description of tissue removed.  Necrotic tissue, blood loss as above  7.  Evidence of the progress of the wound's response to treatment.  A.  Current wound volume (current dimensions and depth).  25 x 13 x 2  B.  Presence (and extent of) of infection.  yes  C.  Presence (and extent of) of non viable tissue.   Yes, all debrided  D.  Other material in the wound that is expected to inhibit healing.  None now  8.  Was there any viable tissue removed (measurements): no

## 2015-08-31 NOTE — Anesthesia Preprocedure Evaluation (Addendum)
Anesthesia Evaluation  Patient identified by MRN, date of birth, ID band Patient awake    Reviewed: Allergy & Precautions, NPO status , Patient's Chart, lab work & pertinent test results, reviewed documented beta blocker date and time   History of Anesthesia Complications Negative for: history of anesthetic complications  Airway Mallampati: II  TM Distance: >3 FB Neck ROM: Full    Dental  (+) Chipped, Missing, Dental Advisory Given   Pulmonary COPD, former smoker (quit 7/16),    breath sounds clear to auscultation       Cardiovascular hypertension, Pt. on medications and Pt. on home beta blockers (-) angina+ CAD, + Cardiac Stents (Cx DES) and + Peripheral Vascular Disease   Rhythm:Irregular Rate:Normal  3/17 cath: Cx stent patent, otherwise non-obstructive, inferior hypokinesis, EF 50-55%   Neuro/Psych    GI/Hepatic GERD  Medicated and Controlled,(+)     substance abuse  alcohol use,   Endo/Other  negative endocrine ROS  Renal/GU Renal InsufficiencyRenal disease (h/o acute kidney injury)   Prostate cancer    Musculoskeletal   Abdominal   Peds  Hematology  (+) Blood dyscrasia (Hb 10.2), ,   Anesthesia Other Findings Bullous pemphigoid: prednisone daily  Reproductive/Obstetrics                           Anesthesia Physical Anesthesia Plan  ASA: III and emergent  Anesthesia Plan: General   Post-op Pain Management:    Induction: Intravenous  Airway Management Planned: Oral ETT  Additional Equipment: Arterial line  Intra-op Plan:   Post-operative Plan: Possible Post-op intubation/ventilation  Informed Consent: I have reviewed the patients History and Physical, chart, labs and discussed the procedure including the risks, benefits and alternatives for the proposed anesthesia with the patient or authorized representative who has indicated his/her understanding and acceptance.    Dental advisory given  Plan Discussed with: CRNA and Surgeon  Anesthesia Plan Comments: (Plan routine monitors, A line, GETA with probable transfusion and post op ventilation)        Anesthesia Quick Evaluation

## 2015-08-31 NOTE — ED Notes (Signed)
Family updated on pt's status.

## 2015-08-31 NOTE — ED Notes (Signed)
Pt was just here for scrotum pain, pt has scrotum cellulitis. Pt c/o sever pain. Pt at [facility] for rehab.

## 2015-08-31 NOTE — ED Provider Notes (Signed)
CSN: [id number]     Arrival date & time 08/31/15  1501 History   First MD Initiated Contact with Patient 08/31/15 1518     Chief Complaint  Patient presents with  . Groin Swelling     (Consider location/radiation/quality/duration/timing/severity/associated sxs/prior Treatment) HPI Patient presents to the emergency department with worsening scrotal issues.  The patient states that he was recently admitted to the hospital for scrotal cellulitis and discharged to a nursing facility for rehabilitation.  The patient states that the doctor evaluated him today and was very concerned about his scrotum and sent him to the emergency department.  The patient states that he is unable to ambulate due to the pain in his scrotal region and pelvic area.  Patient states that nothing seems make the condition better, movement and palpation are make the pain worse.  Patient states that when he urinates he also has more pain, discomfortThe patient denies chest pain, shortness of breath, headache,blurred vision, neck pain, fever, cough, weakness, numbness, dizziness, anorexia, edema, abdominal pain, nausea, vomiting, diarrhea, rash, back pain, hematemesis, bloody stool, near syncope, or syncope. Past Medical History  Diagnosis Date  . Prostate cancer ([place]) 05/03/2009    Prostatectomy/Adenocrcinoma  . Heart murmur     history  . Cataract     left eye surgery/   . Radiation 07/02/11-08/22/11    Prostate fossa 68.4 gray 38 fractions  . Cellulitis of scrotum   . Hyponatremia   . Bullous pemphigoid   . Peripheral vascular disease ([place])   . AKI (acute kidney injury) ([place])   . Thrombocytopenia ([place])   . CAD (coronary artery disease)   . Severe malnutrition ([place])   . Alcohol dependence ([place])    Past Surgical History  Procedure Laterality Date  . Left eye surgery    . Hemorrhoid surgery    . Robot assisted laparoscopic radical prostatectomy  05/03/2009  . Wrist surgery      left  . Left heart catheterization  with coronary angiogram N/A 02/25/2012    Procedure: LEFT HEART CATHETERIZATION WITH CORONARY ANGIOGRAM;  Surgeon: Clent Demark, MD;  Location: [facility] CATH LAB;  Service: Cardiovascular;  Laterality: N/A;  . Peripheral vascular catheterization N/A 02/10/2015    Procedure: Abdominal Aortogram;  Surgeon: Elam Dutch, MD;  Location: [place] CV LAB;  Service: Cardiovascular;  Laterality: N/A;  . Peripheral vascular catheterization Bilateral 02/10/2015    Procedure: Lower Extremity Angiography;  Surgeon: Elam Dutch, MD;  Location: [place] CV LAB;  Service: Cardiovascular;  Laterality: Bilateral;  . Endarterectomy femoral Left 02/17/2015    Procedure: LEFT FEMORAl ENDARTERECTOMY ;  Surgeon: Angelia Mould, MD;  Location: [place];  Service: Vascular;  Laterality: Left;  . Patch angioplasty Left 02/17/2015    Procedure: VEIN PATCH ANGIOPLASTY;  Surgeon: Angelia Mould, MD;  Location: [place];  Service: Vascular;  Laterality: Left;  . Cardiac catheterization N/A 08/04/2015    Procedure: Left Heart Cath and Coronary Angiography;  Surgeon: Charolette Forward, MD;  Location: [place] CV LAB;  Service: Cardiovascular;  Laterality: N/A;   Family History  Problem Relation Age of Onset  . Uterine cancer Mother 25    going to [employer]  . Breast cancer Sister    Social History  Substance Use Topics  . Smoking status: Former Smoker -- 2.00 packs/day for 36 years    Types: Cigarettes    Quit date: 12/08/2014  . Smokeless tobacco: Never Used  . Alcohol Use: 3.6 oz/week  6 Cans of beer per week     Comment: 6-7  cans drinks daily 12 oz    Review of Systems  All other systems negative except as documented in the HPI. All pertinent positives and negatives as reviewed in the HPI.  Allergies  Other  Home Medications   Prior to Admission medications   Medication Sig Start Date End Date Taking? Authorizing Provider  aspirin EC 81 MG EC tablet Take 1 tablet (81 mg total) by mouth daily.  02/27/12  Yes Charolette Forward, MD  atorvastatin (LIPITOR) 80 MG tablet Take 1 tablet (80 mg total) by mouth daily at 6 PM. 02/27/12  Yes Charolette Forward, MD  DUREZOL 0.05 % EMUL Place 1 drop into the right eye 2 (two) times daily. 07/10/15  Yes Historical Provider, MD  HYDROcodone-acetaminophen (NORCO/VICODIN) 5-325 MG tablet Take 1-2 tablets by mouth every 4 (four) hours as needed for moderate pain. Patient taking differently: Take 1-2 tablets by mouth every 4 (four) hours as needed for moderate pain or severe pain.  08/22/15  Yes Orson Eva, MD  latanoprost (XALATAN) 0.005 % ophthalmic solution Place 1 drop into both eyes at bedtime.   Yes Historical Provider, MD  metoprolol succinate (TOPROL-XL) 25 MG 24 hr tablet Take 25 mg by mouth 2 (two) times daily.   Yes Historical Provider, MD  nitroGLYCERIN (NITROSTAT) 0.4 MG SL tablet Place 1 tablet (0.4 mg total) under the tongue every 5 (five) minutes x 3 doses as needed for chest pain. 02/27/12  Yes Charolette Forward, MD  predniSONE (DELTASONE) 10 MG tablet Take 1 tablet (10 mg total) by mouth 2 (two) times daily with a meal. 08/22/15  Yes Orson Eva, MD  Protein (PROCEL) POWD Take 2 scoop by mouth 2 (two) times daily.   Yes Historical Provider, MD  triamcinolone cream (KENALOG) 0.1 % Apply 1 application topically 3 (three) times daily. Apply to rashes on sacral area x2 weeks.   Yes Historical Provider, MD  cephALEXin (KEFLEX) 500 MG capsule Take 1 capsule (500 mg total) by mouth every 12 (twelve) hours. Patient not taking: Reported on 08/31/2015 08/22/15   Orson Eva, MD  doxycycline (VIBRA-TABS) 100 MG tablet Take 1 tablet (100 mg total) by mouth every 12 (twelve) hours. Patient not taking: Reported on 08/31/2015 08/22/15   Orson Eva, MD  pantoprazole (PROTONIX) 40 MG tablet Take 1 tablet (40 mg total) by mouth daily. Patient not taking: Reported on 08/31/2015 08/04/15   Charolette Forward, MD   BP 102/71 mmHg  Pulse 98  Temp(Src) 98.3 F (36.8 C) (Oral)  Resp 18  Wt  60.782 kg  SpO2 100% Physical Exam  Constitutional: He is oriented to person, place, and time. He appears well-developed and well-nourished. No distress.  HENT:  Head: Normocephalic and atraumatic.  Mouth/Throat: Oropharynx is clear and moist.  Eyes: Pupils are equal, round, and reactive to light.  Neck: Normal range of motion. Neck supple.  Cardiovascular: Normal rate, regular rhythm and normal heart sounds.  Exam reveals no gallop and no friction rub.   No murmur heard. Pulmonary/Chest: Effort normal and breath sounds normal. No respiratory distress. He has no wheezes.  Abdominal: Soft. Bowel sounds are normal. He exhibits no distension. There is no tenderness.  Genitourinary:     Neurological: He is alert and oriented to person, place, and time. He exhibits normal muscle tone. Coordination normal.  Skin: Skin is warm and dry. No rash noted. No erythema.  Psychiatric: He has a normal mood and affect.  His behavior is normal.  Nursing note and vitals reviewed.   ED Course  Procedures (including critical care time) Labs Review Labs Reviewed  BASIC METABOLIC PANEL - Abnormal; Notable for the following:    Glucose, Bld 155 (*)    BUN 28 (*)    All other components within normal limits  CBC WITH DIFFERENTIAL/PLATELET - Abnormal; Notable for the following:    WBC 10.6 (*)    RBC 3.06 (*)    Hemoglobin 9.7 (*)    HCT 30.1 (*)    Neutro Abs 9.5 (*)    Lymphs Abs 0.6 (*)    All other components within normal limits  I-STAT CHEM 8, ED - Abnormal; Notable for the following:    BUN 26 (*)    Glucose, Bld 150 (*)    Hemoglobin 10.2 (*)    HCT 30.0 (*)    All other components within normal limits  I-STAT CG4 LACTIC ACID, ED    Imaging Review Ct Pelvis Wo Contrast  08/31/2015  CLINICAL DATA:  65 year old male with severe scrotal pain. Increased pain with gangrenous tissue today. Initial encounter. EXAM: CT PELVIS WITHOUT CONTRAST TECHNIQUE: Multidetector CT imaging of the pelvis  was performed following the standard protocol without intravenous contrast. COMPARISON:  CT Abdomen and Pelvis 08/15/2015. FINDINGS: New widespread subcutaneous gas throughout the scrotum and tracking cephalad to the suprapubic space and inferior rectus muscles. No pelvic floor involvement. No retroperitoneal involvement. There is an unchanged simple appearing circumscribed fluid collection in the medial left thigh. Extensive Aortoiliac calcified atherosclerosis noted. Calcified plaque continues into the bilateral femoral arteries, worse on the right. No pelvic free fluid. Stool ball in the rectum. Small volume of gas in the urinary bladder. Retained stool in the distal colon. Negative visualized distal small bowel. Negative appendix and visible cecum. No pneumoperitoneum. Osteopenia.   No acute osseous abnormality identified. IMPRESSION: 1. Positive for Fournier's gangrene necrotizing fasciitis. Subcutaneous gas tracks from the scrotum cephalad to the superficial suprapubic soft tissues an inferior rectus muscles. This critical value/emergent finding was discussed by telephone with Brysten Reister on 08/31/2015 at 17:02 . 2. Small volume of gas within the lumen of the urinary bladder. 3. No pelvic floor or retroperitoneal inflammation. Negative visualized lower abdominal viscera. 4. Severe calcified atherosclerosis of the aorta, pelvic arteries and femoral arteries. Electronically Signed   By: Genevie Ann M.D.   On: 08/31/2015 17:04   I have personally reviewed and evaluated these images and lab results as part of my medical decision-making.    MDM   Final diagnoses:  Cellulitis          CRITICAL CARE Performed by: Brent [facility] Total critical care time: 50 minutes Critical care time was exclusive of separately billable procedures and treating other patients. Critical care was necessary to treat or prevent imminent or life-threatening deterioration. Critical care was time spent  personally by me on the following activities: development of treatment plan with patient and/or surrogate as well as nursing, discussions with consultants, evaluation of patient's response to treatment, examination of patient, obtaining history from patient or surrogate, ordering and performing treatments and interventions, ordering and review of laboratory studies, ordering and review of radiographic studies, pulse oximetry and re-evaluation of patient's condition.   I paged urology at 1611 on the assumption that this was a significant infection such as Fourniers.  It is currently 1707 9 yet to hear back from urology.  They have been paged again.  Patient was closely monitored due to  the fact that I feel he has Fournier's gangrene.  I spoke with the radiologist and he called with a report stating that the patient has extensive necrotizing fasciitis.  I spoke with urology, who will come and evaluate the patient and taken to the operating room.  Patient is made aware the plan and all questions were answered  Dalia Heading, PA-C 09/01/15 [place], MD 09/02/15 [phone]

## 2015-08-31 NOTE — Anesthesia Postprocedure Evaluation (Signed)
Anesthesia Post Note  Patient: Rodney [name]  Procedure(s) Performed: Procedure(s) (LRB): IRRIGATION AND DEBRIDEMENT SCROTAL  ABSCESS (N/A) IRRIGATION AND DEBRIDEMENT ABSCESS (N/A)  Patient location during evaluation: PACU Anesthesia Type: General Level of consciousness: awake and alert, oriented and patient cooperative Pain management: pain level controlled Vital Signs Assessment: post-procedure vital signs reviewed and stable Respiratory status: spontaneous breathing, nonlabored ventilation, respiratory function stable and patient connected to nasal cannula oxygen Cardiovascular status: blood pressure returned to baseline and stable Postop Assessment: no signs of nausea or vomiting Anesthetic complications: no    Last Vitals:  Filed Vitals:   08/31/15 2140 08/31/15 2145  BP:    Pulse: 78 78  Temp: 36.3 C 36.3 C  Resp: 13 21    Last Pain:  Filed Vitals:   08/31/15 2154  PainSc: 0-No pain                 Reynol Arnone,E. Asmi Fugere

## 2015-08-31 NOTE — ED Notes (Signed)
Bed: [id number] Expected date:  Expected time:  Means of arrival:  Comments: EMS- bedridden, scrotal pain

## 2015-08-31 NOTE — Transfer of Care (Signed)
Immediate Anesthesia Transfer of Care Note  Patient: Rodney [name]  Procedure(s) Performed: Procedure(s): IRRIGATION AND DEBRIDEMENT SCROTAL  ABSCESS (N/A) IRRIGATION AND DEBRIDEMENT ABSCESS (N/A)  Patient Location: PACU  Anesthesia Type:General  Level of Consciousness: awake, alert  and oriented  Airway & Oxygen Therapy: Patient Spontanous Breathing and Patient connected to face mask oxygen  Post-op Assessment: Report given to RN and Post -op Vital signs reviewed and stable  Post vital signs: Reviewed and stable  Last Vitals:  Filed Vitals:   08/31/15 1715 08/31/15 1820  BP: 118/82 119/73  Pulse: 85 82  Temp:    Resp: 20 18    Complications: No apparent anesthesia complications

## 2015-08-31 NOTE — Op Note (Signed)
Preoperative diagnosis:  1. Fournier's Gangrene   Postoperative diagnosis: 1. Same  Procedure(s): 1. Debriedment of Fournier's gangrene to the abdomen. (area of abdominal debridement was 25 x 13 cm) Additionally the entire scrotum,half of the perineum and a majority of the penile skin were debrided as well  Surgeon: Dr. Kathie Rhodes Resident: Dr. Jearld Adjutant  Anesthesia: General   Complications: none  EBL: 50 ml  Intraoperative findings: Infected tissue from the suprapubic area to the perineum. Penile skin was involved but able to spare the prepuce. The entire scrotal skin was involved but testicles appeared viable.   Indication: 65 y.o. male with fournier's gangrene  Description of procedure:  The patient was brought back to the OR and placed in supine position. Anesthesia was induced with general anesthesia and an ET tube. Appropriate antibiotics were given in the ED and covered the patient for surgery. A preoperative timeout was performed.  A foley catheter was placed. We began by making an incision along the suprapubic area with Bovie electrocautery this was carried up to about 5 cm below the umbilicus. We then dissected out all visibly infected tissue. We paid careful attention to spare the urethra. The testicles were able to be spared. Most of the penile skin was debrided. We dissected down to the perineum and the rectum appeared to be uninvolved with good blood flow in the perineal region. General surgery assisted in this part of the dissection and evaluated the rectum. Hemostasis was maintained with electrocautery. The wound was then packed with wet to dry dressing of 3 kyrlex.  Plan  - admit to ICU - urology to continue to follow - continue broad spectrum antibiotics with anaerobe coverage.    I was present and participated in 100% of the operation. Claybon Jabs M.D.

## 2015-09-01 ENCOUNTER — Encounter (HOSPITAL_COMMUNITY): Payer: Self-pay | Admitting: Internal Medicine

## 2015-09-01 DIAGNOSIS — N493 Fournier gangrene: Secondary | ICD-10-CM | POA: Diagnosis present

## 2015-09-01 DIAGNOSIS — D72829 Elevated white blood cell count, unspecified: Secondary | ICD-10-CM | POA: Diagnosis present

## 2015-09-01 DIAGNOSIS — I10 Essential (primary) hypertension: Secondary | ICD-10-CM

## 2015-09-01 HISTORY — DX: Essential (primary) hypertension: I10

## 2015-09-01 LAB — BASIC METABOLIC PANEL
ANION GAP: 8 (ref 5–15)
BUN: 22 mg/dL — ABNORMAL HIGH (ref 6–20)
CHLORIDE: 107 mmol/L (ref 101–111)
CO2: 25 mmol/L (ref 22–32)
Calcium: 8.6 mg/dL — ABNORMAL LOW (ref 8.9–10.3)
Creatinine, Ser: 0.6 mg/dL — ABNORMAL LOW (ref 0.61–1.24)
GFR calc Af Amer: >60 mL/min (ref >60)
GLUCOSE: 98 mg/dL (ref 65–99)
POTASSIUM: 3.8 mmol/L (ref 3.5–5.1)
Sodium: 140 mmol/L (ref 135–145)

## 2015-09-01 LAB — CBC
HCT: 33 % — ABNORMAL LOW (ref 39.0–52.0)
HEMOGLOBIN: 11.2 g/dL — AB (ref 13.0–17.0)
MCH: 30.8 pg (ref 26.0–34.0)
MCHC: 33.9 g/dL (ref 30.0–36.0)
MCV: 90.7 fL (ref 78.0–100.0)
PLATELETS: 311 10*3/uL (ref 150–400)
RBC: 3.64 MIL/uL — AB (ref 4.22–5.81)
RDW: 16.6 % — ABNORMAL HIGH (ref 11.5–15.5)
WBC: 14 10*3/uL — AB (ref 4.0–10.5)

## 2015-09-01 LAB — POCT I-STAT 7, (LYTES, BLD GAS, ICA,H+H)
ACID-BASE EXCESS: 2 mmol/L (ref 0.0–2.0)
Bicarbonate: 26.7 mEq/L — ABNORMAL HIGH (ref 20.0–24.0)
Calcium, Ion: 1.18 mmol/L (ref 1.13–1.30)
HCT: 22 % — ABNORMAL LOW (ref 39.0–52.0)
HEMOGLOBIN: 7.5 g/dL — AB (ref 13.0–17.0)
O2 SAT: 100 %
PCO2 ART: 39.3 mmHg (ref 35.0–45.0)
PH ART: 7.435 (ref 7.350–7.450)
POTASSIUM: 3.6 mmol/L (ref 3.5–5.1)
Sodium: 137 mmol/L (ref 135–145)
TCO2: 28 mmol/L (ref 0–100)
pO2, Arterial: 263 mmHg — ABNORMAL HIGH (ref 80.0–100.0)

## 2015-09-01 LAB — MRSA PCR SCREENING: MRSA by PCR: NEGATIVE

## 2015-09-01 MED ORDER — SODIUM CHLORIDE 0.9% FLUSH
3.0000 mL | Freq: Two times a day (BID) | INTRAVENOUS | Status: DC
  Administered 2015-09-01 – 2015-09-07 (×14): 3 mL via INTRAVENOUS

## 2015-09-01 MED ORDER — ONDANSETRON HCL 4 MG PO TABS: 4.0000 mg | ORAL_TABLET | Freq: Four times a day (QID) | ORAL | Status: DC | PRN

## 2015-09-01 MED ORDER — ALBUTEROL SULFATE (2.5 MG/3ML) 0.083% IN NEBU: 2.5000 mg | INHALATION_SOLUTION | RESPIRATORY_TRACT | Status: DC | PRN

## 2015-09-01 MED ORDER — ONDANSETRON HCL 4 MG/2ML IJ SOLN
4.0000 mg | Freq: Four times a day (QID) | INTRAMUSCULAR | Status: DC | PRN
  Administered 2015-09-02: 4 mg via INTRAVENOUS

## 2015-09-01 MED ORDER — DIFLUPREDNATE 0.05 % OP EMUL
1.0000 [drp] | Freq: Two times a day (BID) | OPHTHALMIC | Status: DC
  Filled 2015-09-01: qty 5

## 2015-09-01 MED ORDER — PREDNISONE 10 MG PO TABS
10.0000 mg | ORAL_TABLET | Freq: Two times a day (BID) | ORAL | Status: DC
  Administered 2015-09-01 – 2015-09-08 (×13): 10 mg via ORAL
  Filled 2015-09-01 (×13): qty 1

## 2015-09-01 MED ORDER — HEPARIN SODIUM (PORCINE) 5000 UNIT/ML IJ SOLN
5000.0000 [IU] | Freq: Three times a day (TID) | INTRAMUSCULAR | Status: DC
  Administered 2015-09-01 – 2015-09-08 (×17): 5000 [IU] via SUBCUTANEOUS
  Filled 2015-09-01 (×17): qty 1

## 2015-09-01 MED ORDER — PREDNISOLONE ACETATE 1 % OP SUSP
1.0000 [drp] | Freq: Four times a day (QID) | OPHTHALMIC | Status: DC
  Filled 2015-09-01: qty 1

## 2015-09-01 MED ORDER — PREDNISOLONE ACETATE 1 % OP SUSP
1.0000 [drp] | Freq: Four times a day (QID) | OPHTHALMIC | Status: DC
  Administered 2015-09-01 – 2015-09-08 (×24): 1 [drp] via OPHTHALMIC
  Filled 2015-09-01: qty 5

## 2015-09-01 MED ORDER — SODIUM CHLORIDE 0.9 % IV SOLN
INTRAVENOUS | Status: DC
  Administered 2015-09-01: 06:00:00 via INTRAVENOUS
  Administered 2015-09-02: 100 mL/h via INTRAVENOUS
  Administered 2015-09-02 – 2015-09-06 (×7): via INTRAVENOUS

## 2015-09-01 MED ORDER — PIPERACILLIN-TAZOBACTAM 3.375 G IVPB
3.3750 g | Freq: Three times a day (TID) | INTRAVENOUS | Status: DC
  Administered 2015-09-01 – 2015-09-04 (×9): 3.375 g via INTRAVENOUS
  Filled 2015-09-01 (×9): qty 50

## 2015-09-01 MED ORDER — POLYVINYL ALCOHOL 1.4 % OP SOLN
1.0000 [drp] | OPHTHALMIC | Status: DC | PRN
  Filled 2015-09-01: qty 15

## 2015-09-01 MED ORDER — PRO-STAT SUGAR FREE PO LIQD
60.0000 mL | Freq: Two times a day (BID) | ORAL | Status: DC
  Administered 2015-09-01 – 2015-09-08 (×13): 60 mL via ORAL
  Filled 2015-09-01 (×13): qty 60

## 2015-09-01 MED ORDER — HYDROCODONE-ACETAMINOPHEN 5-325 MG PO TABS
1.0000 | ORAL_TABLET | ORAL | Status: DC | PRN
  Administered 2015-09-01 – 2015-09-02 (×4): 2 via ORAL
  Administered 2015-09-02: 1 via ORAL
  Administered 2015-09-03 – 2015-09-08 (×11): 2 via ORAL
  Filled 2015-09-01 (×12): qty 2
  Filled 2015-09-01: qty 1
  Filled 2015-09-01 (×3): qty 2

## 2015-09-01 MED ORDER — MORPHINE SULFATE (PF) 2 MG/ML IV SOLN
2.0000 mg | INTRAVENOUS | Status: DC | PRN
  Administered 2015-09-02 – 2015-09-07 (×7): 2 mg via INTRAVENOUS
  Filled 2015-09-01 (×7): qty 1

## 2015-09-01 MED ORDER — LATANOPROST 0.005 % OP SOLN
1.0000 [drp] | Freq: Every day | OPHTHALMIC | Status: DC
  Administered 2015-09-01 – 2015-09-07 (×7): 1 [drp] via OPHTHALMIC
  Filled 2015-09-01: qty 2.5

## 2015-09-01 MED ORDER — NITROGLYCERIN 0.4 MG SL SUBL: 0.4000 mg | SUBLINGUAL_TABLET | SUBLINGUAL | Status: DC | PRN

## 2015-09-01 MED ORDER — ATORVASTATIN CALCIUM 40 MG PO TABS
80.0000 mg | ORAL_TABLET | Freq: Every day | ORAL | Status: DC
  Administered 2015-09-01 – 2015-09-07 (×6): 80 mg via ORAL
  Filled 2015-09-01 (×6): qty 2

## 2015-09-01 MED ORDER — METOPROLOL SUCCINATE ER 25 MG PO TB24
25.0000 mg | ORAL_TABLET | Freq: Two times a day (BID) | ORAL | Status: DC
  Administered 2015-09-01 – 2015-09-03 (×7): 25 mg via ORAL
  Filled 2015-09-01 (×7): qty 1

## 2015-09-01 NOTE — Progress Notes (Signed)
1 Day Post-Op Subjective: S/p debriedment on 08/31/15, doing well, afebrile, labs normalizing, making urine, dressing in place, pain controlled.  Objective: Vital signs in last 24 hours: Temp:  [97.4 F (36.3 C)-98.7 F (37.1 C)] 98.7 F (37.1 C) (03/31 0400) Pulse Rate:  [68-122] 99 (03/31 0600) Resp:  [9-23] 17 (03/31 0600) BP: (98-156)/(54-94) 98/54 mmHg (03/31 0744) SpO2:  [96 %-100 %] 100 % (03/31 0600) Arterial Line BP: (147-159)/(67-85) 149/75 mmHg (03/30 2219) Weight:  [55.8 kg (123 lb 0.3 oz)-60.782 kg (134 lb)] 55.8 kg (123 lb 0.3 oz) (03/30 2300)  Intake/Output from previous day: 03/30 0701 - 03/31 0700 In: 3026.3 [I.V.:2150.3; Blood:596; IV Piggyback:200] Out: 485 [Urine:335; Blood:150] Intake/Output this shift:    Physical Exam:  General: Alert and oriented CV: RRR Lungs: Clear Abdomen: Soft, ND, dressing in place without evidence of significant bleeding Ext: NT, No erythema  Lab Results: Lab Results  Component Value Date   WBC 14.0* 09/01/2015   HGB 11.2* 09/01/2015   HCT 33.0* 09/01/2015   MCV 90.7 09/01/2015   PLT 311 09/01/2015    BMET  Recent Labs  08/31/15 1617 08/31/15 1629 09/01/15 0445  NA 139 138 140  K 4.0 3.9 3.8  CL 105 101 107  CO2 27  --  25  GLUCOSE 155* 150* 98  BUN 28* 26* 22*  CREATININE 0.91 0.90 0.60*  CALCIUM 8.9  --  8.6*     Studies/Results: Ct Pelvis Wo Contrast  08/31/2015  CLINICAL DATA:  65 year old male with severe scrotal pain. Increased pain with gangrenous tissue today. Initial encounter. EXAM: CT PELVIS WITHOUT CONTRAST TECHNIQUE: Multidetector CT imaging of the pelvis was performed following the standard protocol without intravenous contrast. COMPARISON:  CT Abdomen and Pelvis 08/15/2015. FINDINGS: New widespread subcutaneous gas throughout the scrotum and tracking cephalad to the suprapubic space and inferior rectus muscles. No pelvic floor involvement. No retroperitoneal involvement. There is an unchanged  simple appearing circumscribed fluid collection in the medial left thigh. Extensive Aortoiliac calcified atherosclerosis noted. Calcified plaque continues into the bilateral femoral arteries, worse on the right. No pelvic free fluid. Stool ball in the rectum. Small volume of gas in the urinary bladder. Retained stool in the distal colon. Negative visualized distal small bowel. Negative appendix and visible cecum. No pneumoperitoneum. Osteopenia.   No acute osseous abnormality identified. IMPRESSION: 1. Positive for Fournier's gangrene necrotizing fasciitis. Subcutaneous gas tracks from the scrotum cephalad to the superficial suprapubic soft tissues an inferior rectus muscles. This critical value/emergent finding was discussed by telephone with CHRISTOPHER LAWYER on 08/31/2015 at 17:02 . 2. Small volume of gas within the lumen of the urinary bladder. 3. No pelvic floor or retroperitoneal inflammation. Negative visualized lower abdominal viscera. 4. Severe calcified atherosclerosis of the aorta, pelvic arteries and femoral arteries. Electronically Signed   By: Genevie Ann M.D.   On: 08/31/2015 17:04    Assessment/Plan: 65 y.o. male with Fournier's Gangrene - Continue broad spectrum antibiotics - Pain management as needed - Plan to return to the OR with general surgery 09/02/15 for second look and dressing change - appreciate assistance of the critical care team for this complicated patient   LOS: 1 day   Christell Faith 09/01/2015, 8:15 AM    Patient was seen, examined,treatment plan was discussed with the resident.  I have directly reviewed the clinical findings, lab, imaging studies and management of this patient in detail. I have made the necessary changes and/or additions to the above noted documentation, and agree with  the documentation, as recorded by the resident.

## 2015-09-01 NOTE — Care Management Note (Signed)
Case Management Note  Patient Details  Name: ARNETTE LANOUX MRN: [id number] Date of Birth: 02-22-1951  Subjective/Objective:   65 y/o m admitted w/Fournier's gangrene. S/p I&D. From SNF-[facility]-CSW already following.                 Action/Plan:d/c plan return SNF.   Expected Discharge Date:   (UNKNOWN)               Expected Discharge Plan:  Skilled Nursing Facility  In-House Referral:  Clinical Social Work  Discharge planning Services  CM Consult  Post Acute Care Choice:    Choice offered to:     DME Arranged:    DME Agency:     HH Arranged:    [place] Agency:     Status of Service:  In process, will continue to follow  Medicare Important Message Given:    Date Medicare IM Given:    Medicare IM give by:    Date Additional Medicare IM Given:    Additional Medicare Important Message give by:     If discussed at [place] of Stay Meetings, dates discussed:    Additional Comments:  Dessa Phi, RN 09/01/2015, 1:41 PM

## 2015-09-01 NOTE — Progress Notes (Signed)
Patient ID: Rodney [name], male   DOB: 1950/10/09, 65 y.o.   MRN: [id number]     [facility]      [address] [place], [place] [address]    Phone: [phone] FAX: [phone]     Subjective: Afebrile.  Tolerating POs.  WBC increased.  Sore.  On PO pain meds.   Objective:  Vital signs:  Filed Vitals:   09/01/15 0400 09/01/15 0500 09/01/15 0600 09/01/15 0744  BP:    98/54  Pulse: 94 89 99   Temp: 98.7 F (37.1 C)     TempSrc: Oral     Resp: '12 13 17   ' Height:      Weight:      SpO2: 100% 100% 100%        Intake/Output   Yesterday:  03/30 0701 - 03/31 0700 In: 3026.3 [I.V.:2150.3; Blood:596; IV Piggyback:200] Out: 485 [Urine:335; Blood:150] This shift: I/O last 3 completed shifts: In: 3026.3 [I.V.:2150.3; Blood:596; Other:80; IV [id number]:734] Out: 193 [Urine:335; Blood:150]    Physical Exam: General: Pt awake/alert/oriented x4 in no acute distress  Skin: suprapubic dressing removed, wound is clean, serous drainage.  Packing was replaced.  Dressing on scrotum/penis saturated in serosanguinous drainage, reinforced with abd pad.   Problem List:   Principal Problem:   Fournier's gangrene Active Problems:   Anemia, normocytic normochromic   Leukocytosis   Benign essential HTN    Results:   Labs: Results for orders placed or performed during the hospital encounter of 08/31/15 (from the past 48 hour(s))  Basic metabolic panel     Status: Abnormal   Collection Time: 08/31/15  4:17 PM  Result Value Ref Range   Sodium 139 135 - 145 mmol/L   Potassium 4.0 3.5 - 5.1 mmol/L   Chloride 105 101 - 111 mmol/L   CO2 27 22 - 32 mmol/L   Glucose, Bld 155 (H) 65 - 99 mg/dL   BUN 28 (H) 6 - 20 mg/dL   Creatinine, Ser 0.91 0.61 - 1.24 mg/dL   Calcium 8.9 8.9 - 10.3 mg/dL   GFR calc non Af Amer >60 >60 mL/min   GFR calc Af Amer >60 >60 mL/min    Comment: (NOTE) The eGFR has been calculated using the CKD EPI  equation. This calculation has not been validated in all clinical situations. eGFR's persistently <60 mL/min signify possible Chronic Kidney Disease.    Anion gap 7 5 - 15  CBC with Differential     Status: Abnormal   Collection Time: 08/31/15  4:17 PM  Result Value Ref Range   WBC 10.6 (H) 4.0 - 10.5 K/uL   RBC 3.06 (L) 4.22 - 5.81 MIL/uL   Hemoglobin 9.7 (L) 13.0 - 17.0 g/dL   HCT 30.1 (L) 39.0 - 52.0 %   MCV 98.4 78.0 - 100.0 fL   MCH 31.7 26.0 - 34.0 pg   MCHC 32.2 30.0 - 36.0 g/dL   RDW 15.0 11.5 - 15.5 %   Platelets 367 150 - 400 K/uL   Neutrophils Relative % 90 %   Neutro Abs 9.5 (H) 1.7 - 7.7 K/uL   Lymphocytes Relative 6 %   Lymphs Abs 0.6 (L) 0.7 - 4.0 K/uL   Monocytes Relative 4 %   Monocytes Absolute 0.5 0.1 - 1.0 K/uL   Eosinophils Relative 0 %   Eosinophils Absolute 0.0 0.0 - 0.7 K/uL   Basophils Relative 0 %   Basophils Absolute 0.0 0.0 - 0.1  K/uL  I-stat Chem 8, ED     Status: Abnormal   Collection Time: 08/31/15  4:29 PM  Result Value Ref Range   Sodium 138 135 - 145 mmol/L   Potassium 3.9 3.5 - 5.1 mmol/L   Chloride 101 101 - 111 mmol/L   BUN 26 (H) 6 - 20 mg/dL   Creatinine, Ser 0.90 0.61 - 1.24 mg/dL   Glucose, Bld 150 (H) 65 - 99 mg/dL   Calcium, Ion 1.20 1.13 - 1.30 mmol/L   TCO2 27 0 - 100 mmol/L   Hemoglobin 10.2 (L) 13.0 - 17.0 g/dL   HCT 30.0 (L) 39.0 - 52.0 %  I-Stat CG4 Lactic Acid, ED     Status: None   Collection Time: 08/31/15  4:30 PM  Result Value Ref Range   Lactic Acid, Venous 1.31 0.5 - 2.0 mmol/L  Prepare RBC (crossmatch)     Status: None   Collection Time: 08/31/15  6:14 PM  Result Value Ref Range   Order Confirmation ORDER PROCESSED BY BLOOD BANK   Type and screen [facility]     Status: None (Preliminary result)   Collection Time: 08/31/15  6:30 PM  Result Value Ref Range   ABO/RH(D) A POS    Antibody Screen NEG    Sample Expiration 09/03/2015    Unit Number [id number]    Blood Component Type RED  CELLS,LR    Unit division 00    Status of Unit ISSUED,FINAL    Transfusion Status OK TO TRANSFUSE    Crossmatch Result Compatible    Unit Number [id number]    Blood Component Type RBC LR PHER2    Unit division 00    Status of Unit ISSUED,FINAL    Transfusion Status OK TO TRANSFUSE    Crossmatch Result Compatible    Unit Number [id number]    Blood Component Type RBC LR PHER1    Unit division 00    Status of Unit ALLOCATED    Transfusion Status OK TO TRANSFUSE    Crossmatch Result Compatible    Unit Number [id number]    Blood Component Type RBC LR PHER1    Unit division 00    Status of Unit ALLOCATED    Transfusion Status OK TO TRANSFUSE    Crossmatch Result Compatible   Prepare RBC     Status: None   Collection Time: 08/31/15  8:34 PM  Result Value Ref Range   Order Confirmation ORDER PROCESSED BY BLOOD BANK   MRSA PCR Screening     Status: None   Collection Time: 08/31/15 11:33 PM  Result Value Ref Range   MRSA by PCR NEGATIVE NEGATIVE    Comment:        The GeneXpert MRSA Assay (FDA approved for NASAL specimens only), is one component of a comprehensive MRSA colonization surveillance program. It is not intended to diagnose MRSA infection nor to guide or monitor treatment for MRSA infections.   CBC     Status: Abnormal   Collection Time: 09/01/15  4:45 AM  Result Value Ref Range   WBC 14.0 (H) 4.0 - 10.5 K/uL   RBC 3.64 (L) 4.22 - 5.81 MIL/uL   Hemoglobin 11.2 (L) 13.0 - 17.0 g/dL   HCT 33.0 (L) 39.0 - 52.0 %   MCV 90.7 78.0 - 100.0 fL    Comment: DELTA CHECK NOTED   MCH 30.8 26.0 - 34.0 pg   MCHC 33.9 30.0 - 36.0 g/dL   RDW 16.6 (H) 11.5 -  15.5 %   Platelets 311 150 - 400 K/uL  Basic metabolic panel     Status: Abnormal   Collection Time: 09/01/15  4:45 AM  Result Value Ref Range   Sodium 140 135 - 145 mmol/L   Potassium 3.8 3.5 - 5.1 mmol/L   Chloride 107 101 - 111 mmol/L   CO2 25 22 - 32 mmol/L   Glucose, Bld 98 65 - 99 mg/dL   BUN 22 (H) 6 -  20 mg/dL   Creatinine, Ser 0.60 (L) 0.61 - 1.24 mg/dL   Calcium 8.6 (L) 8.9 - 10.3 mg/dL   GFR calc non Af Amer >60 >60 mL/min   GFR calc Af Amer >60 >60 mL/min    Comment: (NOTE) The eGFR has been calculated using the CKD EPI equation. This calculation has not been validated in all clinical situations. eGFR's persistently <60 mL/min signify possible Chronic Kidney Disease.    Anion gap 8 5 - 15    Imaging / Studies: Ct Pelvis Wo Contrast  08/31/2015  CLINICAL DATA:  65 year old male with severe scrotal pain. Increased pain with gangrenous tissue today. Initial encounter. EXAM: CT PELVIS WITHOUT CONTRAST TECHNIQUE: Multidetector CT imaging of the pelvis was performed following the standard protocol without intravenous contrast. COMPARISON:  CT Abdomen and Pelvis 08/15/2015. FINDINGS: New widespread subcutaneous gas throughout the scrotum and tracking cephalad to the suprapubic space and inferior rectus muscles. No pelvic floor involvement. No retroperitoneal involvement. There is an unchanged simple appearing circumscribed fluid collection in the medial left thigh. Extensive Aortoiliac calcified atherosclerosis noted. Calcified plaque continues into the bilateral femoral arteries, worse on the right. No pelvic free fluid. Stool ball in the rectum. Small volume of gas in the urinary bladder. Retained stool in the distal colon. Negative visualized distal small bowel. Negative appendix and visible cecum. No pneumoperitoneum. Osteopenia.   No acute osseous abnormality identified. IMPRESSION: 1. Positive for Fournier's gangrene necrotizing fasciitis. Subcutaneous gas tracks from the scrotum cephalad to the superficial suprapubic soft tissues an inferior rectus muscles. This critical value/emergent finding was discussed by telephone with CHRISTOPHER LAWYER on 08/31/2015 at 17:02 . 2. Small volume of gas within the lumen of the urinary bladder. 3. No pelvic floor or retroperitoneal inflammation. Negative  visualized lower abdominal viscera. 4. Severe calcified atherosclerosis of the aorta, pelvic arteries and femoral arteries. Electronically Signed   By: Genevie Ann M.D.   On: 08/31/2015 17:04    Medications / Allergies:  Scheduled Meds: . atorvastatin  80 mg Oral q1800  . feeding supplement (PRO-STAT SUGAR FREE 64)  60 mL Oral BID  . heparin  5,000 Units Subcutaneous 3 times per day  . latanoprost  1 drop Both Eyes QHS  . metoprolol succinate  25 mg Oral BID  .  morphine injection  4 mg Intravenous Once  . piperacillin-tazobactam (ZOSYN)  IV  3.375 g Intravenous 3 times per day  . prednisoLONE acetate  1 drop Right Eye QID  . predniSONE  10 mg Oral BID WC  . sodium chloride flush  3 mL Intravenous Q12H  . vancomycin  750 mg Intravenous Q12H   Continuous Infusions: . sodium chloride 100 mL/hr at 09/01/15 0602   PRN Meds:.albuterol, HYDROcodone-acetaminophen, morphine injection, nitroGLYCERIN, ondansetron **OR** ondansetron (ZOFRAN) IV  Antibiotics: Anti-infectives    Start     Dose/Rate Route Frequency Ordered Stop   09/01/15 0600  vancomycin (VANCOCIN) IVPB 750 mg/150 ml premix     750 mg 150 mL/hr over 60 Minutes Intravenous Every  12 hours 08/31/15 1708     09/01/15 0230  piperacillin-tazobactam (ZOSYN) IVPB 3.375 g     3.375 g 12.5 mL/hr over 240 Minutes Intravenous 3 times per day 09/01/15 0228     08/31/15 1715  piperacillin-tazobactam (ZOSYN) IVPB 3.375 g     3.375 g 12.5 mL/hr over 240 Minutes Intravenous  Once 08/31/15 1703 08/31/15 1915   08/31/15 1630  vancomycin (VANCOCIN) IVPB 1000 mg/200 mL premix     1,000 mg 200 mL/hr over 60 Minutes Intravenous STAT 08/31/15 1621 08/31/15 1730        Assessment/Plan Necrotizing soft tissue infection POD#1 debridement of skin and soft tissue abdominal wall and scrotum--Dr. Ottelin/[place] -suprapubic dressing was removed only, wound is clean.  May start BID wet to dry dressing changes from our standpoint, but will leave up to  urology.  ID-Vanc/zosyn.  Follow culture FEN-no issues, encourage PO intake Dispo-stable for floor from a surgical standpoint    Erby Pian, [facility] Surgery Pager [phone])   09/01/2015 8:09 AM

## 2015-09-01 NOTE — H&P (Signed)
[facility] History and Physical  Rodney [name] [id number] DOB: 10-16-1950 DOA: 08/31/2015  Referring physician: ED Dalia Heading, PA PCP: Simona Huh, MD   Chief Complaint: Testicular pain and swelling  HPI:  Rodney [name] is a 65 year old male with a past medical history significant for CAD, PVD,  alcohol use, and prostate cancer s/p radiation and prostatectomy, ; who presents for scrotal swelling and pain. He initially noted discomfort with walking approximately 5 days ago.   He had been initially treated as an outpatient with ciprofloxacin prior to being admitted into the hospitalfrom 3/14 - 3/21 for scrotal cellulitis. Initially treated with cefepime and Zosyn and switch to by mouth antibiotics of doxycycline and cephalexin to follow-up with urology in 2 weeks. Thereafter it appears he was transferred to [facility] for rehabilitation. This is where he notes symptoms were not improving and the pain was not being relieved with Norco. The patient also was noted to have worsening erythema with swelling and had started having sloughing of the skin of his scrotum with worsening pain for which he was reevaluated at the nursing home facility today and sent to the emergency department for further evaluation. Patient denies any fever, chills, nausea, or vomiting. Patient does endorse drinking beer but notes his last beer was over 16 days ago. Upon admission into the emergency department patient was evaluated with a CT scan of the pelvis which revealed subcutaneous air tracking into the soft tissues suspected to be Fournier's gangrene. Patient was evaluated by urology and surgery and taken immediately to the operating room for I&D.     Review of Systems  Constitutional: Negative for fever and chills.  HENT: Positive for hearing loss.   Eyes: Negative for photophobia and discharge.  Respiratory: Negative for hemoptysis and sputum production.   Cardiovascular: Negative for chest pain,  palpitations and leg swelling.  Gastrointestinal: Negative for nausea and vomiting.  Genitourinary: Positive for dysuria.       Positive for scrotal pain  Musculoskeletal: Negative for back pain and neck pain.  Skin: Positive for rash. Negative for itching.  Neurological: Negative for sensory change and speech change.  Endo/Heme/Allergies: Negative for environmental allergies and polydipsia.  Psychiatric/Behavioral: Negative for hallucinations and substance abuse.       Past Medical History  Diagnosis Date  . Prostate cancer ([place]) 05/03/2009    Prostatectomy/Adenocrcinoma  . Heart murmur     history  . Cataract     left eye surgery/   . Radiation 07/02/11-08/22/11    Prostate fossa 68.4 gray 38 fractions  . Cellulitis of scrotum   . Hyponatremia   . Bullous pemphigoid   . Peripheral vascular disease ([place])   . AKI (acute kidney injury) ([place])   . Thrombocytopenia ([place])   . CAD (coronary artery disease)   . Severe malnutrition ([place])   . Alcohol dependence ([place])      Past Surgical History  Procedure Laterality Date  . Left eye surgery    . Hemorrhoid surgery    . Robot assisted laparoscopic radical prostatectomy  05/03/2009  . Wrist surgery      left  . Left heart catheterization with coronary angiogram N/A 02/25/2012    Procedure: LEFT HEART CATHETERIZATION WITH CORONARY ANGIOGRAM;  Surgeon: Clent Demark, MD;  Location: [facility] CATH LAB;  Service: Cardiovascular;  Laterality: N/A;  . Peripheral vascular catheterization N/A 02/10/2015    Procedure: Abdominal Aortogram;  Surgeon: Elam Dutch, MD;  Location: [place] CV LAB;  Service: Cardiovascular;  Laterality: N/A;  . Peripheral vascular catheterization Bilateral 02/10/2015    Procedure: Lower Extremity Angiography;  Surgeon: Elam Dutch, MD;  Location: [place] CV LAB;  Service: Cardiovascular;  Laterality: Bilateral;  . Endarterectomy femoral Left 02/17/2015    Procedure: LEFT FEMORAl ENDARTERECTOMY ;  Surgeon:  Angelia Mould, MD;  Location: [place];  Service: Vascular;  Laterality: Left;  . Patch angioplasty Left 02/17/2015    Procedure: VEIN PATCH ANGIOPLASTY;  Surgeon: Angelia Mould, MD;  Location: [place];  Service: Vascular;  Laterality: Left;  . Cardiac catheterization N/A 08/04/2015    Procedure: Left Heart Cath and Coronary Angiography;  Surgeon: Charolette Forward, MD;  Location: [place] CV LAB;  Service: Cardiovascular;  Laterality: N/A;      Social History:  reports that he quit smoking about 8 months ago. His smoking use included Cigarettes. He has a 72 pack-year smoking history. He has never used smokeless tobacco. He reports that he drinks about 3.6 oz of alcohol per week. He reports that he does not use illicit drugs.   Allergies  Allergen Reactions  . Other Rash    polyester and metals except titanium.    Family History  Problem Relation Age of Onset  . Uterine cancer Mother 40    going to [facility]  . Breast cancer Sister        Prior to Admission medications   Medication Sig Start Date End Date Taking? Authorizing Provider  aspirin EC 81 MG EC tablet Take 1 tablet (81 mg total) by mouth daily. 02/27/12  Yes Charolette Forward, MD  atorvastatin (LIPITOR) 80 MG tablet Take 1 tablet (80 mg total) by mouth daily at 6 PM. 02/27/12  Yes Charolette Forward, MD  DUREZOL 0.05 % EMUL Place 1 drop into the right eye 2 (two) times daily. 07/10/15  Yes Historical Provider, MD  HYDROcodone-acetaminophen (NORCO/VICODIN) 5-325 MG tablet Take 1-2 tablets by mouth every 4 (four) hours as needed for moderate pain. Patient taking differently: Take 1-2 tablets by mouth every 4 (four) hours as needed for moderate pain or severe pain.  08/22/15  Yes Orson Eva, MD  latanoprost (XALATAN) 0.005 % ophthalmic solution Place 1 drop into both eyes at bedtime.   Yes Historical Provider, MD  metoprolol succinate (TOPROL-XL) 25 MG 24 hr tablet Take 25 mg by mouth 2 (two) times daily.   Yes Historical Provider, MD   nitroGLYCERIN (NITROSTAT) 0.4 MG SL tablet Place 1 tablet (0.4 mg total) under the tongue every 5 (five) minutes x 3 doses as needed for chest pain. 02/27/12  Yes Charolette Forward, MD  predniSONE (DELTASONE) 10 MG tablet Take 1 tablet (10 mg total) by mouth 2 (two) times daily with a meal. 08/22/15  Yes Orson Eva, MD  Protein (PROCEL) POWD Take 2 scoop by mouth 2 (two) times daily.   Yes Historical Provider, MD  triamcinolone cream (KENALOG) 0.1 % Apply 1 application topically 3 (three) times daily. Apply to rashes on sacral area x2 weeks.   Yes Historical Provider, MD  cephALEXin (KEFLEX) 500 MG capsule Take 1 capsule (500 mg total) by mouth every 12 (twelve) hours. Patient not taking: Reported on 08/31/2015 08/22/15   Orson Eva, MD  doxycycline (VIBRA-TABS) 100 MG tablet Take 1 tablet (100 mg total) by mouth every 12 (twelve) hours. Patient not taking: Reported on 08/31/2015 08/22/15   Orson Eva, MD  pantoprazole (PROTONIX) 40 MG tablet Take 1 tablet (40 mg total) by mouth daily. Patient not taking: Reported on  08/31/2015 08/04/15   Charolette Forward, MD     Physical Exam: Filed Vitals:   08/31/15 2219 08/31/15 2300 09/01/15 0000 09/01/15 0035  BP:      Pulse: 81 69 76 79  Temp: 97.7 F (36.5 C) 97.6 F (36.4 C)  97.7 F (36.5 C)  TempSrc:  Oral  Oral  Resp: 23 11 10 12   Weight:      SpO2: 96% 96% 100% 100%     Constitutional: Vital signs reviewed. Patient is a well-developed and well-nourished in no acute distress and cooperative with exam. Alert and oriented x3.  Head: Normocephalic and atraumatic  Ear: TM normal bilaterally  Mouth: no erythema or exudates, MMM  Eyes: PERRL, EOMI, conjunctivae normal, No scleral icterus.  Neck: Supple, Trachea midline normal ROM, No JVD, mass, thyromegaly, or carotid bruit present.  Cardiovascular: RRR, S1 normal, S2 normal, no MRG, pulses symmetric and intact bilaterally  Pulmonary/Chest: CTAB, no wheezes, rales, or rhonchi  Abdominal: Soft. Non-tender,  non-distended, bowel sounds are normal, no masses, organomegaly, or guarding present.  GU: Genital area bandaged post procedure with Foley present draining yellow urine  Musculoskeletal: No joint deformities, erythema, or stiffness, ROM full and no nontender Ext: no edema and no cyanosis, pulses palpable bilaterally (DP and PT)  Hematology: no cervical, inginal, or axillary adenopathy.  Neurological: A&O x3, Strenght is normal and symmetric bilaterally, cranial nerve II-XII are grossly intact, no focal motor deficit, sensory intact to light touch bilaterally.  Skin: Warm, dry and intact. No rash, cyanosis, or clubbing.  Psychiatric: Normal mood and affect. speech and behavior is normal. Judgment and thought content normal. Cognition and memory are normal.      Data Review   Micro Results No results found for this or any previous visit (from the past 240 hour(s)).  Radiology Reports Dg Chest 2 View  08/15/2015  CLINICAL DATA:  Shortness of breath. EXAM: CHEST  2 VIEW COMPARISON:  August 03, 2015. FINDINGS: The heart size and mediastinal contours are within normal limits. Both lungs are clear. No pneumothorax or pleural effusion is noted. The visualized skeletal structures are unremarkable. IMPRESSION: No active cardiopulmonary disease. Electronically Signed   By: Marijo Conception, M.D.   On: 08/15/2015 15:42   Dg Chest 2 View  08/03/2015  CLINICAL DATA:  Chest pain starting today. Myocardial infarction in 2009. EXAM: CHEST  2 VIEW COMPARISON:  02/16/2015 FINDINGS: Emphysema. Mild atherosclerotic calcification of the aortic arch. Heart size within normal limits. Left lateral ninth and tenth rib irregularities, probably old fractures but not readily visible on 02/09/2015 CT, correlate with any point tenderness along the left lower chest. Biapical pleural parenchymal scarring.  No pleural effusion. IMPRESSION: 1. Left lateral ninth and tenth rib irregularities probably from old fractures, correlate  with any point tenderness in this vicinity in assessing for more recent fracture. 2. Emphysema. 3. Atherosclerotic aortic arch. Electronically Signed   By: Van Clines M.D.   On: 08/03/2015 16:55   Ct Pelvis Wo Contrast  08/31/2015  CLINICAL DATA:  65 year old male with severe scrotal pain. Increased pain with gangrenous tissue today. Initial encounter. EXAM: CT PELVIS WITHOUT CONTRAST TECHNIQUE: Multidetector CT imaging of the pelvis was performed following the standard protocol without intravenous contrast. COMPARISON:  CT Abdomen and Pelvis 08/15/2015. FINDINGS: New widespread subcutaneous gas throughout the scrotum and tracking cephalad to the suprapubic space and inferior rectus muscles. No pelvic floor involvement. No retroperitoneal involvement. There is an unchanged simple appearing circumscribed fluid collection in the  medial left thigh. Extensive Aortoiliac calcified atherosclerosis noted. Calcified plaque continues into the bilateral femoral arteries, worse on the right. No pelvic free fluid. Stool ball in the rectum. Small volume of gas in the urinary bladder. Retained stool in the distal colon. Negative visualized distal small bowel. Negative appendix and visible cecum. No pneumoperitoneum. Osteopenia.   No acute osseous abnormality identified. IMPRESSION: 1. Positive for Fournier's gangrene necrotizing fasciitis. Subcutaneous gas tracks from the scrotum cephalad to the superficial suprapubic soft tissues an inferior rectus muscles. This critical value/emergent finding was discussed by telephone with CHRISTOPHER LAWYER on 08/31/2015 at 17:02 . 2. Small volume of gas within the lumen of the urinary bladder. 3. No pelvic floor or retroperitoneal inflammation. Negative visualized lower abdominal viscera. 4. Severe calcified atherosclerosis of the aorta, pelvic arteries and femoral arteries. Electronically Signed   By: Genevie Ann M.D.   On: 08/31/2015 17:04   US Abdomen Complete  08/16/2015   CLINICAL DATA:  Generalized abdominal pain EXAM: ABDOMEN ULTRASOUND COMPLETE COMPARISON:  None. FINDINGS: Gallbladder: No gallstones or wall thickening visualized. No sonographic Murphy sign noted by sonographer. Common bile duct: Diameter: 3.4 mm Liver: No focal lesion identified. Within normal limits in parenchymal echogenicity. IVC: No abnormality visualized. Pancreas: Visualized portion unremarkable. Spleen: Size and appearance within normal limits. Right Kidney: Length: 10.2 cm. Echogenicity within normal limits. No mass or hydronephrosis visualized. Left Kidney: Length: 9.6 cm. Echogenicity within normal limits. No mass or hydronephrosis visualized. Abdominal aorta: No aneurysm visualized. Other findings: Small right pleural effusion. IMPRESSION: 1. No cholelithiasis or sonographic evidence of acute cholecystitis. 2. No obstructive uropathy. Electronically Signed   By: Kathreen Devoid   On: 08/16/2015 16:25   US Scrotum  08/15/2015  CLINICAL DATA:  Enlarged painful, inflamed scrotum EXAM: ULTRASOUND OF SCROTUM TECHNIQUE: Complete ultrasound examination of the testicles, epididymis, and other scrotal structures was performed. COMPARISON:  None available FINDINGS: Right testicle Measurements: 3.2 x 2.2 x 2.3 cm. No mass or microlithiasis visualized. Left testicle Measurements: 3.2 x 1.8 x 2.3 cm. No mass or microlithiasis visualized. Right epididymis: Enlarged, heterogeneous in appearance, no significant hypervascularity. Left epididymis: Small left epididymal incidental cyst measures 3 mm. Left epididymis also appears enlarged, heterogeneous and ill-defined. Hydrocele: Heterogeneous complex mixed echogenicity hydroceles present bilaterally with diffuse scrotal edema and skin thickening, difficult to exclude pyocele / infection. Varicocele:  None visualized. IMPRESSION: Diffuse scrotal skin thickening and edema with bilateral complex heterogeneous hydroceles and/or pyoceles. Difficult to exclude abscess. No  testicular abnormality. Electronically Signed   By: Jerilynn Mages.  Shick M.D.   On: 08/15/2015 16:42   Ct Abdomen Pelvis W Contrast  08/15/2015  CLINICAL DATA:  Groin pain with walking and moving since 08/10/2015, swelling, dysuria, on Cipro for possible UTI, pain is burning and non radiating, no fever or chills, history prostate cancer, former smoker EXAM: CT ABDOMEN AND PELVIS WITH CONTRAST TECHNIQUE: Multidetector CT imaging of the abdomen and pelvis was performed using the standard protocol following bolus administration of intravenous contrast. Sagittal and coronal MPR images reconstructed from axial data set. CONTRAST:  139mL OMNIPAQUE IOHEXOL 300 MG/ML SOLN IV. Dilute oral contrast. COMPARISON:  CT abdomen and pelvis 02/09/2015 FINDINGS: Lung bases clear. Atrophic pancreas. Liver, gallbladder, spleen, pancreas, kidneys, and adrenal glands otherwise unremarkable. Extensive atherosclerotic calcification greatest at the iliac arteries. Coronary arterial calcifications as well. 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 Normal appendix. Unremarkable bladder and ureters. Prostate gland surgically absent. Question small BILATERAL inguinal hernias containing fat.  Scattered edema within the subcutaneous soft tissues at the inferior pelvis particularly anteriorly. Significant soft tissue swelling/ edema and question hydroceles in scrotum. Stomach and bowel loops normal appearance. No mass, adenopathy, free air, or free fluid. No acute osseous lesions. IMPRESSION: Question small BILATERAL inguinal hernias containing fat. Significant soft tissue swelling of the scrotum with question BILATERAL hydroceles. No acute intra-abdominal or intrapelvic abnormalities otherwise seen. Extensive atherosclerotic calcification as above. Electronically Signed   By: Lavonia Dana M.D.   On: 08/15/2015 17:25     CBC  Recent Labs Lab 08/31/15 1617 08/31/15 1629  WBC 10.6*  --   HGB 9.7* 10.2*  HCT 30.1*  30.0*  PLT 367  --   MCV 98.4  --   MCH 31.7  --   MCHC 32.2  --   RDW 15.0  --   LYMPHSABS 0.6*  --   MONOABS 0.5  --   EOSABS 0.0  --   BASOSABS 0.0  --     Chemistries   Recent Labs Lab 08/31/15 1617 08/31/15 1629  NA 139 138  K 4.0 3.9  CL 105 101  CO2 27  --   GLUCOSE 155* 150*  BUN 28* 26*  CREATININE 0.91 0.90  CALCIUM 8.9  --    ------------------------------------------------------------------------------------------------------------------ estimated creatinine clearance is 72.2 mL/min (by C-G formula based on Cr of 0.9). ------------------------------------------------------------------------------------------------------------------ No results for input(s): HGBA1C in the last 72 hours. ------------------------------------------------------------------------------------------------------------------ No results for input(s): CHOL, HDL, LDLCALC, TRIG, CHOLHDL, LDLDIRECT in the last 72 hours. ------------------------------------------------------------------------------------------------------------------ No results for input(s): TSH, T4TOTAL, T3FREE, THYROIDAB in the last 72 hours.  Invalid input(s): FREET3 ------------------------------------------------------------------------------------------------------------------ No results for input(s): VITAMINB12, FOLATE, FERRITIN, TIBC, IRON, RETICCTPCT in the last 72 hours.  Coagulation profile No results for input(s): INR, PROTIME in the last 168 hours.  No results for input(s): DDIMER in the last 72 hours.  Cardiac Enzymes No results for input(s): CKMB, TROPONINI, MYOGLOBIN in the last 168 hours.  Invalid input(s): CK ------------------------------------------------------------------------------------------------------------------ Invalid input(s): POCBNP   CBG: No results for input(s): GLUCAP in the last 168 hours.       Assessment/Plan Fournier's gangrene: Acute. CT scan reviewed. Patient taken for  I&D - admitted to the ICU  - continue antibiotics of vancomycin and Zosyn  - Follow with surgery and urology for further recommendations    Leukocytosis: WBC count 10.6 on admission likely secondary to above Acute blood loss anemia: Following procedure was noted to patient likely lost about 100 mL of blood. Patient was ordered 2 units of packed red blood cells. - Follow-up CBC in a.m.  Dehydration: Acute. Elevated BUN to creatinine ratio - IV fluids normal saline at 100 mL per hour   Essential hypertension -Continue metoprolol   Hyperlipidemia  - Continue atorvastatin   DVT prophylaxis: heparin  Code Status:   full Family Communication: bedside Disposition Plan: admit   Total time spent 55 minutes.Greater than 50% of this time was spent in counseling, explanation of diagnosis, planning of further management, and coordination of care  [facility] Pager [phone]  If 7PM-7AM, please contact night-coverage [email or web address] Password [facility] 09/01/2015, 12:39 AM

## 2015-09-01 NOTE — Progress Notes (Signed)
Pharmacy Antibiotic Note  Rodney [name] is a 65 y.o. male admitted on 08/31/2015 with necrotizing wound infection.  Pharmacy has been consulted for zosyn dosing.  Plan: Zosyn 3.375g IV q8h (4 hour infusion).  Height: 5\' 8"  (172.7 cm) Weight: 123 lb 0.3 oz (55.8 kg) IBW/kg (Calculated) : 68.4  Temp (24hrs), Avg:97.6 F (36.4 C), Min:97.4 F (36.3 C), Max:98.3 F (36.8 C)   Recent Labs Lab 08/31/15 1617 08/31/15 1629 08/31/15 1630 09/01/15 0445  WBC 10.6*  --   --  14.0*  CREATININE 0.91 0.90  --   --   LATICACIDVEN  --   --  1.31  --     Estimated Creatinine Clearance: 66.3 mL/min (by C-G formula based on Cr of 0.9).    Allergies  Allergen Reactions  . Other Rash    polyester and metals except titanium.    Antimicrobials this admission: 3/30 Vancomycin >>  3/30 Zosyn >>  Microbiology results: pending  Thank you for allowing pharmacy to be a part of this patient's care.  Nani Skillern Crowford 09/01/2015 5:22 AM

## 2015-09-01 NOTE — Progress Notes (Signed)
[facility] PROGRESS NOTE   AISHA OBERMANN [id number] DOB: December 30, 1950 DOA: 08/31/2015 PCP: Simona Huh, MD  Subjective: Patient seen with 2 nurses at bedside, he feels very okay. No fever or chills, Foley catheter in place, over 250 mL of urine since a.m.  HPI: Mr. Tonn is a 65 year old male with a past medical history significant for CAD, PVD, alcohol use, and prostate cancer s/p radiation and prostatectomy, ; who presents for scrotal swelling and pain. He initially noted discomfort with walking approximately 5 days ago. He had been initially treated as an outpatient with ciprofloxacin prior to being admitted into the hospitalfrom 3/14 - 3/21 for scrotal cellulitis. Initially treated with cefepime and Zosyn and switch to by mouth antibiotics of doxycycline and cephalexin to follow-up with urology in 2 weeks. Thereafter it appears he was transferred to [facility] for rehabilitation. This is where he notes symptoms were not improving and the pain was not being relieved with Norco. The patient also was noted to have worsening erythema with swelling and had started having sloughing of the skin of his scrotum with worsening pain for which he was reevaluated at the nursing home facility today and sent to the emergency department for further evaluation. Patient denies any fever, chills, nausea, or vomiting. Patient does endorse drinking beer but notes his last beer was over 16 days ago. Upon admission into the emergency department patient was evaluated with a CT scan of the pelvis which revealed subcutaneous air tracking into the soft tissues suspected to be Fournier's gangrene. Patient was evaluated by urology and surgery and taken immediately to the operating room for I&D  Assessment/Plan: Principal Problem:   Fournier's gangrene Active Problems:   Anemia, normocytic normochromic   Leukocytosis   Benign essential HTN   Fournier's gangrene Acute. CT scan showed gas bubble  extending from the perineum. Urology and general surgery emergently consulted, patient taken to the OR on 08/31/15 night. Status post debridement, no fever or chills, started on antibiotics. Plans noted to take him to the OR on 09/02/15 for second look and dressing changes.  Leukocytosis WBC count 10.6 on admission likely secondary to above, was not febrile so did not meet sepsis criteria on admission.  Acute blood loss anemia Patient has hemoglobin of 9.7, he lost 100 mL perioperatively. Status post transfusion of 2 units of packed RBC, hemoglobin 11.2.  Continue to follow CBC closely.  Dehydration His creatinine is better than ever, he was discharged from the hospital on 08/22/15 with creatinine of 2.26. Creatinine now is 0.9. Continue IV fluids because of concurrent infection.  Essential hypertension -Continue metoprolol   Hyperlipidemia  - Continue atorvastatin   DVT prophylaxis: heparin  Code Status: Full Code Family Communication: Plan discussed with the patient. Disposition Plan: Remains inpatient Diet: Diet regular Room service appropriate?: Yes; Fluid consistency:: Thin  Consultants:  None  Procedures:  Debridement of Fournier's gangrene  Antibiotics:  Vancomycin and Zosyn   Objective: Filed Vitals:   09/01/15 1000 09/01/15 1130  BP:  113/81  Pulse:  88  Temp: 98.7 F (37.1 C)   Resp:  12    Intake/Output Summary (Last 24 hours) at 09/01/15 1221 Last data filed at 09/01/15 1100  Gross per 24 hour  Intake 3426.33 ml  Output    735 ml  Net 2691.33 ml   Filed Weights   08/31/15 1615 08/31/15 2300  Weight: 60.782 kg (134 lb) 55.8 kg (123 lb 0.3 oz)    Exam: General: Alert and awake, oriented  x3, not in any acute distress. HEENT: anicteric sclera, pupils reactive to light and accommodation, EOMI CVS: S1-S2 clear, no murmur rubs or gallops Chest: clear to auscultation bilaterally, no wheezing, rales or rhonchi Abdomen: soft nontender,  nondistended, normal bowel sounds, no organomegaly Extremities: no cyanosis, clubbing or edema noted bilaterally Neuro: Cranial nerves II-XII intact, no focal neurological deficits  Data Reviewed: Basic Metabolic Panel:  Recent Labs Lab 08/31/15 1617 08/31/15 1629 09/01/15 0445  NA 139 138 140  K 4.0 3.9 3.8  CL 105 101 107  CO2 27  --  25  GLUCOSE 155* 150* 98  BUN 28* 26* 22*  CREATININE 0.91 0.90 0.60*  CALCIUM 8.9  --  8.6*   Liver Function Tests: No results for input(s): AST, ALT, ALKPHOS, BILITOT, PROT, ALBUMIN in the last 168 hours. No results for input(s): LIPASE, AMYLASE in the last 168 hours. No results for input(s): AMMONIA in the last 168 hours. CBC:  Recent Labs Lab 08/31/15 1617 08/31/15 1629 09/01/15 0445  WBC 10.6*  --  14.0*  NEUTROABS 9.5*  --   --   HGB 9.7* 10.2* 11.2*  HCT 30.1* 30.0* 33.0*  MCV 98.4  --  90.7  PLT 367  --  311   Cardiac Enzymes: No results for input(s): CKTOTAL, CKMB, CKMBINDEX, TROPONINI in the last 168 hours. BNP (last 3 results)  Recent Labs  08/15/15 1913  BNP 365.2*    ProBNP (last 3 results) No results for input(s): PROBNP in the last 8760 hours.  CBG: No results for input(s): GLUCAP in the last 168 hours.  Micro Recent Results (from the past 240 hour(s))  MRSA PCR Screening     Status: None   Collection Time: 08/31/15 11:33 PM  Result Value Ref Range Status   MRSA by PCR NEGATIVE NEGATIVE Final    Comment:        The GeneXpert MRSA Assay (FDA approved for NASAL specimens only), is one component of a comprehensive MRSA colonization surveillance program. It is not intended to diagnose MRSA infection nor to guide or monitor treatment for MRSA infections.      Studies: Ct Pelvis Wo Contrast  08/31/2015  CLINICAL DATA:  65 year old male with severe scrotal pain. Increased pain with gangrenous tissue today. Initial encounter. EXAM: CT PELVIS WITHOUT CONTRAST TECHNIQUE: Multidetector CT imaging of the  pelvis was performed following the standard protocol without intravenous contrast. COMPARISON:  CT Abdomen and Pelvis 08/15/2015. FINDINGS: New widespread subcutaneous gas throughout the scrotum and tracking cephalad to the suprapubic space and inferior rectus muscles. No pelvic floor involvement. No retroperitoneal involvement. There is an unchanged simple appearing circumscribed fluid collection in the medial left thigh. Extensive Aortoiliac calcified atherosclerosis noted. Calcified plaque continues into the bilateral femoral arteries, worse on the right. No pelvic free fluid. Stool ball in the rectum. Small volume of gas in the urinary bladder. Retained stool in the distal colon. Negative visualized distal small bowel. Negative appendix and visible cecum. No pneumoperitoneum. Osteopenia.   No acute osseous abnormality identified. IMPRESSION: 1. Positive for Fournier's gangrene necrotizing fasciitis. Subcutaneous gas tracks from the scrotum cephalad to the superficial suprapubic soft tissues an inferior rectus muscles. This critical value/emergent finding was discussed by telephone with CHRISTOPHER LAWYER on 08/31/2015 at 17:02 . 2. Small volume of gas within the lumen of the urinary bladder. 3. No pelvic floor or retroperitoneal inflammation. Negative visualized lower abdominal viscera. 4. Severe calcified atherosclerosis of the aorta, pelvic arteries and femoral arteries. Electronically Signed  By: Genevie Ann M.D.   On: 08/31/2015 17:04    Scheduled Meds: . atorvastatin  80 mg Oral q1800  . feeding supplement (PRO-STAT SUGAR FREE 64)  60 mL Oral BID  . heparin  5,000 Units Subcutaneous 3 times per day  . latanoprost  1 drop Both Eyes QHS  . metoprolol succinate  25 mg Oral BID  .  morphine injection  4 mg Intravenous Once  . piperacillin-tazobactam (ZOSYN)  IV  3.375 g Intravenous 3 times per day  . prednisoLONE acetate  1 drop Right Eye QID  . predniSONE  10 mg Oral BID WC  . sodium chloride flush  3  mL Intravenous Q12H  . vancomycin  750 mg Intravenous Q12H   Continuous Infusions: . sodium chloride 100 mL/hr at 09/01/15 0602       Time spent: 35 minutes    [facility] A  [facility] Pager [phone] If 7PM-7AM, please contact night-coverage at [email or web address], password [email or web address] 09/01/2015, 12:21 PM  LOS: 1 day

## 2015-09-02 ENCOUNTER — Encounter (HOSPITAL_COMMUNITY)
Admission: EM | Disposition: A | Discharge status: Nursing Home (Includes ICF) | Payer: Self-pay | Source: Home / Self Care | Attending: Internal Medicine

## 2015-09-02 ENCOUNTER — Inpatient Hospital Stay (HOSPITAL_COMMUNITY): Payer: 59 | Admitting: Anesthesiology

## 2015-09-02 HISTORY — PX: IRRIGATION AND DEBRIDEMENT ABSCESS: SHX5252

## 2015-09-02 LAB — BASIC METABOLIC PANEL
ANION GAP: 8 (ref 5–15)
BUN: 17 mg/dL (ref 6–20)
CHLORIDE: 105 mmol/L (ref 101–111)
CO2: 26 mmol/L (ref 22–32)
Calcium: 8.5 mg/dL — ABNORMAL LOW (ref 8.9–10.3)
Creatinine, Ser: 0.6 mg/dL — ABNORMAL LOW (ref 0.61–1.24)
GFR calc non Af Amer: >60 mL/min (ref >60)
Glucose, Bld: 91 mg/dL (ref 65–99)
POTASSIUM: 3.2 mmol/L — AB (ref 3.5–5.1)
SODIUM: 139 mmol/L (ref 135–145)

## 2015-09-02 SURGERY — IRRIGATION AND DEBRIDEMENT ABSCESS
Anesthesia: General | Site: Scrotum

## 2015-09-02 MED ORDER — 0.9 % SODIUM CHLORIDE (POUR BTL) OPTIME
TOPICAL | Status: DC | PRN
  Administered 2015-09-02: 1000 mL

## 2015-09-02 MED ORDER — HYDROMORPHONE HCL 1 MG/ML IJ SOLN
0.2500 mg | INTRAMUSCULAR | Status: DC | PRN
  Administered 2015-09-02 (×2): 0.5 mg via INTRAVENOUS

## 2015-09-02 MED ORDER — PROPOFOL 10 MG/ML IV BOLUS
INTRAVENOUS | Status: AC
  Filled 2015-09-02: qty 20

## 2015-09-02 MED ORDER — HYDROMORPHONE HCL 1 MG/ML IJ SOLN
INTRAMUSCULAR | Status: AC
  Filled 2015-09-02: qty 1

## 2015-09-02 MED ORDER — PROPOFOL 10 MG/ML IV BOLUS
INTRAVENOUS | Status: DC | PRN
  Administered 2015-09-02: 150 mg via INTRAVENOUS

## 2015-09-02 MED ORDER — FENTANYL CITRATE (PF) 100 MCG/2ML IJ SOLN
INTRAMUSCULAR | Status: AC
  Filled 2015-09-02: qty 2

## 2015-09-02 MED ORDER — MIDAZOLAM HCL 2 MG/2ML IJ SOLN
INTRAMUSCULAR | Status: AC
  Filled 2015-09-02: qty 2

## 2015-09-02 MED ORDER — FENTANYL CITRATE (PF) 100 MCG/2ML IJ SOLN
INTRAMUSCULAR | Status: DC | PRN
  Administered 2015-09-02 (×4): 25 ug via INTRAVENOUS

## 2015-09-02 MED ORDER — PHENYLEPHRINE HCL 10 MG/ML IJ SOLN
INTRAMUSCULAR | Status: DC | PRN
  Administered 2015-09-02: 40 ug via INTRAVENOUS

## 2015-09-02 MED ORDER — LIDOCAINE HCL (CARDIAC) 20 MG/ML IV SOLN
INTRAVENOUS | Status: DC | PRN
Start: 2015-09-02 — End: 2015-09-02
  Administered 2015-09-02: 50 mg via INTRAVENOUS

## 2015-09-02 MED ORDER — MIDAZOLAM HCL 5 MG/5ML IJ SOLN
INTRAMUSCULAR | Status: DC | PRN
  Administered 2015-09-02: 2 mg via INTRAVENOUS

## 2015-09-02 MED ORDER — FLUCONAZOLE IN SODIUM CHLORIDE 400-0.9 MG/200ML-% IV SOLN
800.0000 mg | Freq: Once | INTRAVENOUS | Status: AC
  Administered 2015-09-02: 800 mg via INTRAVENOUS
  Filled 2015-09-02: qty 400

## 2015-09-02 MED ORDER — SODIUM CHLORIDE 0.9 % IR SOLN
Status: DC | PRN
  Administered 2015-09-02: 3000 mL

## 2015-09-02 MED ORDER — FLUCONAZOLE IN SODIUM CHLORIDE 400-0.9 MG/200ML-% IV SOLN
400.0000 mg | INTRAVENOUS | Status: DC
  Administered 2015-09-03 – 2015-09-06 (×3): 400 mg via INTRAVENOUS
  Filled 2015-09-02 (×6): qty 200

## 2015-09-02 SURGICAL SUPPLY — 24 items
BLADE HEX COATED 2.75 (ELECTRODE) ×3 IMPLANT
BNDG GAUZE ELAST 4 BULKY (GAUZE/BANDAGES/DRESSINGS) ×12 IMPLANT
COVER SURGICAL LIGHT HANDLE (MISCELLANEOUS) ×3 IMPLANT
DRAPE LAPAROTOMY T 98X78 PEDS (DRAPES) ×3 IMPLANT
DRAPE SURG IRRIG POUCH 19X23 (DRAPES) ×3 IMPLANT
ELECT REM PT RETURN 9FT ADLT (ELECTROSURGICAL) ×3
ELECTRODE REM PT RTRN 9FT ADLT (ELECTROSURGICAL) ×1 IMPLANT
GLOVE BIOGEL M STRL SZ7.5 (GLOVE) ×3 IMPLANT
GOWN STRL REUS W/TWL LRG LVL3 (GOWN DISPOSABLE) ×3 IMPLANT
KIT BASIN OR (CUSTOM PROCEDURE TRAY) ×3 IMPLANT
NEEDLE HYPO 22GX1.5 SAFETY (NEEDLE) IMPLANT
NS IRRIG 1000ML POUR BTL (IV SOLUTION) ×3 IMPLANT
PACK GENERAL/GYN (CUSTOM PROCEDURE TRAY) ×3 IMPLANT
PAD ABD 8X10 STRL (GAUZE/BANDAGES/DRESSINGS) ×9 IMPLANT
SHEET LAVH (DRAPES) ×3 IMPLANT
SUPPORT SCROTAL LG STRP (MISCELLANEOUS) ×2 IMPLANT
SUPPORTER ATHLETIC LG (MISCELLANEOUS) ×1
SUT CHROMIC 3 0 SH 27 (SUTURE) ×6 IMPLANT
SUT VIC AB 2-0 UR5 27 (SUTURE) IMPLANT
SUT VICRYL 0 TIES 12 18 (SUTURE) IMPLANT
SYR CONTROL 10ML LL (SYRINGE) IMPLANT
TOWEL OR 17X26 10 PK STRL BLUE (TOWEL DISPOSABLE) ×6 IMPLANT
TOWEL OR NON WOVEN STRL DISP B (DISPOSABLE) ×3 IMPLANT
WATER STERILE IRR 1500ML POUR (IV SOLUTION) ×3 IMPLANT

## 2015-09-02 NOTE — Transfer of Care (Signed)
Immediate Anesthesia Transfer of Care Note  Patient: Rodney [name]  Procedure(s) Performed: Procedure(s): IRRIGATION AND DEBRIDEMENT OF SCROTUM, ABDOMEN AND PERINEUM  WITH DRESSING CHANGE (N/A)  Patient Location: PACU  Anesthesia Type:General  Level of Consciousness:  sedated, patient cooperative and responds to stimulation  Airway & Oxygen Therapy:Patient Spontanous Breathing and Patient connected to face mask oxgen  Post-op Assessment:  Report given to PACU RN and Post -op Vital signs reviewed and stable  Post vital signs:  Reviewed and stable  Last Vitals:  Filed Vitals:   09/02/15 1000 09/02/15 1200  BP: 134/91   Pulse: 73   Temp:  36.6 C  Resp: 16     Complications: No apparent anesthesia complications

## 2015-09-02 NOTE — Progress Notes (Signed)
Pharmacy Antibiotic Note  Rodney [name] is a 65 y.o. male admitted on 08/31/2015 with necrotizing wound infection.  Currently on Day 3 vancomycin and Zosyn for Fournier's gangrene.  To add fluconazole today with candida growing on tissue culture.    Plan:  Diflucan 800 mg IV x 1, then 400 mg (~6mg /kg) IV q24 hr.  LFTs wnl; on atorvastatin but no need to hold presently  Continue vancomycin and Zosyn as ordered. Consider stopping vancomycin with only GNR and yeast on tissue culture  F/u SCr, clinical course  Height: 5\' 8"  (172.7 cm) Weight: 123 lb 0.3 oz (55.8 kg) IBW/kg (Calculated) : 68.4  Temp (24hrs), Avg:97.9 F (36.6 C), Min:97.4 F (36.3 C), Max:98.7 F (37.1 C)   Recent Labs Lab 08/31/15 1617 08/31/15 1629 08/31/15 1630 09/01/15 0445 09/02/15 0953  WBC 10.6*  --   --  14.0*  --   CREATININE 0.91 0.90  --  0.60* 0.60*  LATICACIDVEN  --   --  1.31  --   --     Estimated Creatinine Clearance: 74.6 mL/min (by C-G formula based on Cr of 0.6).    Allergies  Allergen Reactions  . Other Rash    polyester and metals except titanium.    Antimicrobials this admission: 3/30 Vancomycin >>  3/30 Zosyn >>   Dose adjustments this admission: Last admission: 3/16 VT = 16, but not at steady state d/t renal dysfunction and dose changes.  Microbiology results: 3/30 tissue cx: mod GNR, mod C albicans 3/30 MRSA PCR: neg   Thank you for allowing pharmacy to be a part of this patient's care.  Reuel Boom, PharmD, BCPS Pager: [phone] 09/02/2015, 4:07 PM

## 2015-09-02 NOTE — Progress Notes (Signed)
eLink Physician-Brief Progress Note Patient Name: Rodney [name] DOB: 11-14-1950 MRN: [id number]   Date of Service  09/02/2015  HPI/Events of Note  Patient with fournier's gangrene.  Candida albicans has also been found in surgical tissue culture  eICU Interventions  Fluconazole added to antimicrobial regimen     Intervention Category Intermediate Interventions: Infection - evaluation and management  Mauri Brooklyn, P 09/02/2015, 3:35 PM

## 2015-09-02 NOTE — Progress Notes (Signed)
2 Days Post-Op  Subjective: No complaints  Objective: Vital signs in last 24 hours: Temp:  [97.7 F (36.5 C)-99.1 F (37.3 C)] 97.7 F (36.5 C) (04/01 0400) Pulse Rate:  [64-96] 89 (04/01 0700) Resp:  [8-22] 17 (04/01 0700) BP: (103-151)/(58-118) 151/76 mmHg (04/01 0700) SpO2:  [72 %-98 %] 98 % (04/01 0700) Last BM Date:  (PTA)  Intake/Output from previous day: 03/31 0701 - 04/01 0700 In: 2600 [I.V.:2200; IV Piggyback:400] Out: 1155 [Urine:1155] Intake/Output this shift:    Resp: clear to auscultation bilaterally Cardio: regular rate and rhythm GI: soft, non-tender; bowel sounds normal; no masses,  no organomegaly  Lab Results:   Recent Labs  08/31/15 1617  08/31/15 1958 09/01/15 0445  WBC 10.6*  --   --  14.0*  HGB 9.7*  < > 7.5* 11.2*  HCT 30.1*  < > 22.0* 33.0*  PLT 367  --   --  311  < > = values in this interval not displayed. BMET  Recent Labs  08/31/15 1617 08/31/15 1629 08/31/15 1958 09/01/15 0445  NA 139 138 137 140  K 4.0 3.9 3.6 3.8  CL 105 101  --  107  CO2 27  --   --  25  GLUCOSE 155* 150*  --  98  BUN 28* 26*  --  22*  CREATININE 0.91 0.90  --  0.60*  CALCIUM 8.9  --   --  8.6*   PT/INR No results for input(s): LABPROT, INR in the last 72 hours. ABG  Recent Labs  08/31/15 1958  PHART 7.435  HCO3 26.7*    Studies/Results: Ct Pelvis Wo Contrast  08/31/2015  CLINICAL DATA:  65 year old male with severe scrotal pain. Increased pain with gangrenous tissue today. Initial encounter. EXAM: CT PELVIS WITHOUT CONTRAST TECHNIQUE: Multidetector CT imaging of the pelvis was performed following the standard protocol without intravenous contrast. COMPARISON:  CT Abdomen and Pelvis 08/15/2015. FINDINGS: New widespread subcutaneous gas throughout the scrotum and tracking cephalad to the suprapubic space and inferior rectus muscles. No pelvic floor involvement. No retroperitoneal involvement. There is an unchanged simple appearing circumscribed fluid  collection in the medial left thigh. Extensive Aortoiliac calcified atherosclerosis noted. Calcified plaque continues into the bilateral femoral arteries, worse on the right. No pelvic free fluid. Stool ball in the rectum. Small volume of gas in the urinary bladder. Retained stool in the distal colon. Negative visualized distal small bowel. Negative appendix and visible cecum. No pneumoperitoneum. Osteopenia.   No acute osseous abnormality identified. IMPRESSION: 1. Positive for Fournier's gangrene necrotizing fasciitis. Subcutaneous gas tracks from the scrotum cephalad to the superficial suprapubic soft tissues an inferior rectus muscles. This critical value/emergent finding was discussed by telephone with CHRISTOPHER LAWYER on 08/31/2015 at 17:02 . 2. Small volume of gas within the lumen of the urinary bladder. 3. No pelvic floor or retroperitoneal inflammation. Negative visualized lower abdominal viscera. 4. Severe calcified atherosclerosis of the aorta, pelvic arteries and femoral arteries. Electronically Signed   By: Genevie Ann M.D.   On: 08/31/2015 17:04    Anti-infectives: Anti-infectives    Start     Dose/Rate Route Frequency Ordered Stop   09/01/15 0600  vancomycin (VANCOCIN) IVPB 750 mg/150 ml premix     750 mg 150 mL/hr over 60 Minutes Intravenous Every 12 hours 08/31/15 1708     09/01/15 0230  piperacillin-tazobactam (ZOSYN) IVPB 3.375 g     3.375 g 12.5 mL/hr over 240 Minutes Intravenous 3 times per day 09/01/15 0228  08/31/15 1715  piperacillin-tazobactam (ZOSYN) IVPB 3.375 g     3.375 g 12.5 mL/hr over 240 Minutes Intravenous  Once 08/31/15 1703 08/31/15 1915   08/31/15 1630  vancomycin (VANCOCIN) IVPB 1000 mg/200 mL premix     1,000 mg 200 mL/hr over 60 Minutes Intravenous STAT 08/31/15 1621 08/31/15 1730      Assessment/Plan: s/p Procedure(s): IRRIGATION AND DEBRIDEMENT SCROTAL  ABSCESS (N/A) IRRIGATION AND DEBRIDEMENT ABSCESS (N/A) For debridement in the OR later today   Will check wounds at that time with urology Continue vanc/zosyn  LOS: 2 days    TOTH III,PAUL S 09/02/2015

## 2015-09-02 NOTE — Anesthesia Preprocedure Evaluation (Signed)
Anesthesia Evaluation  Patient identified by MRN, date of birth, ID band Patient awake    Reviewed: Allergy & Precautions, NPO status , Patient's Chart, lab work & pertinent test results  Airway Mallampati: II  TM Distance: >3 FB Neck ROM: Full    Dental no notable dental hx.    Pulmonary neg pulmonary ROS, former smoker,    Pulmonary exam normal breath sounds clear to auscultation       Cardiovascular hypertension, Pt. on medications + Peripheral Vascular Disease  Normal cardiovascular exam Rhythm:Regular Rate:Normal     Neuro/Psych negative neurological ROS  negative psych ROS   GI/Hepatic negative GI ROS, (+)     substance abuse  alcohol use,   Endo/Other  negative endocrine ROS  Renal/GU negative Renal ROS  negative genitourinary   Musculoskeletal negative musculoskeletal ROS (+)   Abdominal   Peds negative pediatric ROS (+)  Hematology negative hematology ROS (+)   Anesthesia Other Findings   Reproductive/Obstetrics negative OB ROS                             Anesthesia Physical Anesthesia Plan  ASA: III  Anesthesia Plan: General   Post-op Pain Management:    Induction: Intravenous  Airway Management Planned: LMA  Additional Equipment:   Intra-op Plan:   Post-operative Plan: Extubation in OR  Informed Consent: I have reviewed the patients History and Physical, chart, labs and discussed the procedure including the risks, benefits and alternatives for the proposed anesthesia with the patient or authorized representative who has indicated his/her understanding and acceptance.   Dental advisory given  Plan Discussed with: CRNA and Surgeon  Anesthesia Plan Comments:         Anesthesia Quick Evaluation

## 2015-09-02 NOTE — Op Note (Signed)
Preoperative diagnosis:  Fournier's gangrene  Postoperative diagnosis: Fournier's gangrene   Procedure: irrigation of wound and dressing change  Surgeon: Gurney Maxin, M.D.  CoSurgeon: Pilar Jarvis  Anesthesia: General  Indications for procedure: Rodney [name] is a 65 y.o. year old male with symptoms of Fournier's gangrene undergoing debridement 08/21/15. He presents today for reexploration.  Description of procedure: The patient was brought into the operative suite. Anesthesia was administered with General LMA anesthesia. WHO checklist was applied. The patient was then placed in lithotomy. The area was prepped and draped in the usual sterile fashion.  The majority of the wound was clean with some fibrinous tissue around the spongiosum. There was some cellulitic changes to bilateral thighs, but no crepitus or purulence or necrosis. Therefore, the pulse lavage was used to clean the wound in its entirety. Afterwards Dr. Pilar Jarvis debrided part of the foreskin, see his dictation for further detail.  Finally, 4 saline soaked kerlix rolls were used to cover the entire wound, ABD were put overtop. The patient then awoke and was taken to pacu in stable condition  Findings: no necrosis of area, some fibrinous debride of spongiosum and necrosis of foreskin  Specimen: none  Implant: 4 kerlix rolls  Blood loss: <135ml  Local anesthesia:   Complications:   Gurney Maxin, M.D. General, Bariatric, & Minimally Invasive Surgery [facility]

## 2015-09-02 NOTE — Op Note (Addendum)
Date of procedure: 09/02/2015  Preoperative diagnosis:  1. Fournier's gangrene  Postoperative diagnosis:  1. Fournier's gangrene   Procedure: 1. Penile skin debridement -excisional 2. Wound irrigation 3. Dressing change  Surgeon: Baruch Gouty, MD  Anesthesia: General  Complications: None  Intraoperative findings: Small amount of necrosis of remaining distal penile skin. Approximately 5 mm of tissue removed in circumferential fashion. Wound irrigation to remove necrotic tissue along the corpus spongiosum. Foley catheter was not removed during this procedure. The urethra was intact at the end of the procedure.  EBL: None  Specimens: none  Drains: 16 Fr foley catheter  Disposition: Stable to the postanesthesia care unit  Indication for procedure: The patient is a 65 y.o. male with Fournier's gangrene status post debridement of penis/scrotum/inferior abdominal wall two days ago who presents for second look.  After reviewing the management options for treatment, the patient elected to proceed with the above surgical procedure(s). We have discussed the potential benefits and risks of the procedure, side effects of the proposed treatment, the likelihood of the patient achieving the goals of the procedure, and any potential problems that might occur during the procedure or recuperation. Informed consent has been obtained.  Description of procedure: The patient was met in the preoperative area. All risks, benefits, and indications of the procedure were described in great detail. The patient consented to the procedure. Preoperative antibiotics were given. The patient was taken to the operative theater. General anesthesia was induced per the anesthesia service. The patient was then placed in the dorsal lithotomy position and prepped and draped in the usual sterile fashion. A preoperative timeout was called. Foley remained in place. Debridement and wound irrigation took place as described above.  Please see general surgery note for information on abdominal wall wound irrigation. The exposed penis and testicles were wrapped in kerlix soaked in normal saline, and there remainder of the wound packed with normal saline soaked gauze. Patient was awoken from anesthesia and transferred in stable condition to PACU.  Plan: Will plan for dressing change in OR Monday. Patient to be made NPO after Midnight Sunday night  Baruch Gouty, M.D.

## 2015-09-02 NOTE — Anesthesia Postprocedure Evaluation (Signed)
Anesthesia Post Note  Patient: Rodney [name]  Procedure(s) Performed: Procedure(s) (LRB): IRRIGATION AND DEBRIDEMENT OF SCROTUM, ABDOMEN AND PERINEUM  WITH DRESSING CHANGE (N/A)  Patient location during evaluation: PACU Anesthesia Type: General Level of consciousness: awake and alert Pain management: pain level controlled Vital Signs Assessment: post-procedure vital signs reviewed and stable Respiratory status: spontaneous breathing, nonlabored ventilation, respiratory function stable and patient connected to nasal cannula oxygen Cardiovascular status: blood pressure returned to baseline and stable Postop Assessment: no signs of nausea or vomiting Anesthetic complications: no    Last Vitals:  Filed Vitals:   09/02/15 1430 09/02/15 1450  BP: 149/85 149/100  Pulse:  72  Temp: 36.3 C 36.4 C  Resp: 16 21    Last Pain:  Filed Vitals:   09/02/15 1452  PainSc: 3                  Fanta Wimberley S

## 2015-09-02 NOTE — Progress Notes (Signed)
[facility] PROGRESS NOTE   BARNES SCHONBERGER [id number] DOB: August 05, 1950 DOA: 08/31/2015 PCP: Simona Huh, MD  Subjective: Patient denies any fever or chills. Nothing by mouth for probable second look today.  HPI: Rodney [name] is a 65 year old male with a past medical history significant for CAD, PVD, alcohol use, and prostate cancer s/p radiation and prostatectomy, ; who presents for scrotal swelling and pain. He initially noted discomfort with walking approximately 5 days ago. He had been initially treated as an outpatient with ciprofloxacin prior to being admitted into the hospitalfrom 3/14 - 3/21 for scrotal cellulitis. Initially treated with cefepime and Zosyn and switch to by mouth antibiotics of doxycycline and cephalexin to follow-up with urology in 2 weeks. Thereafter it appears he was transferred to [facility] for rehabilitation. This is where he notes symptoms were not improving and the pain was not being relieved with Norco. The patient also was noted to have worsening erythema with swelling and had started having sloughing of the skin of his scrotum with worsening pain for which he was reevaluated at the nursing home facility today and sent to the emergency department for further evaluation. Patient denies any fever, chills, nausea, or vomiting. Patient does endorse drinking beer but notes his last beer was over 16 days ago. Upon admission into the emergency department patient was evaluated with a CT scan of the pelvis which revealed subcutaneous air tracking into the soft tissues suspected to be Fournier's gangrene. Patient was evaluated by urology and surgery and taken immediately to the operating room for I&D  Assessment/Plan: Principal Problem:   Fournier's gangrene Active Problems:   Anemia, normocytic normochromic   Leukocytosis   Benign essential HTN   Fournier's gangrene Acute. CT scan showed gas bubble extending from the perineum. Urology and general  surgery emergently consulted, patient taken to the OR on 08/31/15 night. Status post debridement, no fever or chills, started on antibiotics. Plans noted for OR second look today and dressing changes.  Leukocytosis WBC count 10.6 on admission likely secondary to above, was not febrile so did not meet sepsis criteria on admission.  Acute blood loss anemia Patient has hemoglobin of 9.7, he lost 100 mL perioperatively. Status post transfusion of 2 units of packed RBC, hemoglobin 11.2.  We'll follow WBCs with CBC in a.m.  Dehydration His creatinine is better than ever, he was discharged from the hospital on 08/22/15 with creatinine of 2.26. Creatinine now is 0.9. Continue IV fluids because of concurrent infection.  Essential hypertension -Continue metoprolol   Hyperlipidemia  - Continue atorvastatin   Hypokalemia Potassium of 3.2, will replete with oral supplements after surgery.   DVT prophylaxis: heparin  Code Status: Full Code Family Communication: Plan discussed with the patient. Disposition Plan: Remains inpatient Diet: Diet NPO time specified  Consultants:  None  Procedures:  Debridement of Fournier's gangrene  Antibiotics:  Vancomycin and Zosyn   Objective: Filed Vitals:   09/02/15 0900 09/02/15 1000  BP: 153/75 134/91  Pulse: 83 73  Temp:    Resp: 25 16    Intake/Output Summary (Last 24 hours) at 09/02/15 1231 Last data filed at 09/02/15 1100  Gross per 24 hour  Intake   2550 ml  Output   1080 ml  Net   1470 ml   Filed Weights   08/31/15 1615 08/31/15 2300  Weight: 60.782 kg (134 lb) 55.8 kg (123 lb 0.3 oz)    Exam: General: Alert and awake, oriented x3, not in any acute distress. HEENT: anicteric  sclera, pupils reactive to light and accommodation, EOMI CVS: S1-S2 clear, no murmur rubs or gallops Chest: clear to auscultation bilaterally, no wheezing, rales or rhonchi Abdomen: soft nontender, nondistended, normal bowel sounds, no  organomegaly Extremities: no cyanosis, clubbing or edema noted bilaterally Neuro: Cranial nerves II-XII intact, no focal neurological deficits  Data Reviewed: Basic Metabolic Panel:  Recent Labs Lab 08/31/15 1617 08/31/15 1629 08/31/15 1958 09/01/15 0445 09/02/15 0953  NA 139 138 137 140 139  K 4.0 3.9 3.6 3.8 3.2*  CL 105 101  --  107 105  CO2 27  --   --  25 26  GLUCOSE 155* 150*  --  98 91  BUN 28* 26*  --  22* 17  CREATININE 0.91 0.90  --  0.60* 0.60*  CALCIUM 8.9  --   --  8.6* 8.5*   Liver Function Tests: No results for input(s): AST, ALT, ALKPHOS, BILITOT, PROT, ALBUMIN in the last 168 hours. No results for input(s): LIPASE, AMYLASE in the last 168 hours. No results for input(s): AMMONIA in the last 168 hours. CBC:  Recent Labs Lab 08/31/15 1617 08/31/15 1629 08/31/15 1958 09/01/15 0445  WBC 10.6*  --   --  14.0*  NEUTROABS 9.5*  --   --   --   HGB 9.7* 10.2* 7.5* 11.2*  HCT 30.1* 30.0* 22.0* 33.0*  MCV 98.4  --   --  90.7  PLT 367  --   --  311   Cardiac Enzymes: No results for input(s): CKTOTAL, CKMB, CKMBINDEX, TROPONINI in the last 168 hours. BNP (last 3 results)  Recent Labs  08/15/15 1913  BNP 365.2*    ProBNP (last 3 results) No results for input(s): PROBNP in the last 8760 hours.  CBG: No results for input(s): GLUCAP in the last 168 hours.  Micro Recent Results (from the past 240 hour(s))  Tissue culture     Status: None (Preliminary result)   Collection Time: 08/31/15  6:55 PM  Result Value Ref Range Status   Specimen Description ABDOMEN TISSUE NECROTIC  Final   Special Requests NONE  Final   Gram Stain   Final    FEW WBC PRESENT,BOTH PMN AND MONONUCLEAR NO ORGANISMS SEEN Performed at [facility]    Culture   Final    MODERATE GRAM NEGATIVE RODS MODERATE CANDIDA ALBICANS Performed at [facility]    Report Status PENDING  Incomplete  MRSA PCR Screening     Status: None   Collection Time: 08/31/15 11:33 PM    Result Value Ref Range Status   MRSA by PCR NEGATIVE NEGATIVE Final    Comment:        The GeneXpert MRSA Assay (FDA approved for NASAL specimens only), is one component of a comprehensive MRSA colonization surveillance program. It is not intended to diagnose MRSA infection nor to guide or monitor treatment for MRSA infections.      Studies: Ct Pelvis Wo Contrast  08/31/2015  CLINICAL DATA:  65 year old male with severe scrotal pain. Increased pain with gangrenous tissue today. Initial encounter. EXAM: CT PELVIS WITHOUT CONTRAST TECHNIQUE: Multidetector CT imaging of the pelvis was performed following the standard protocol without intravenous contrast. COMPARISON:  CT Abdomen and Pelvis 08/15/2015. FINDINGS: New widespread subcutaneous gas throughout the scrotum and tracking cephalad to the suprapubic space and inferior rectus muscles. No pelvic floor involvement. No retroperitoneal involvement. There is an unchanged simple appearing circumscribed fluid collection in the medial left thigh. Extensive Aortoiliac calcified atherosclerosis noted.  Calcified plaque continues into the bilateral femoral arteries, worse on the right. No pelvic free fluid. Stool ball in the rectum. Small volume of gas in the urinary bladder. Retained stool in the distal colon. Negative visualized distal small bowel. Negative appendix and visible cecum. No pneumoperitoneum. Osteopenia.   No acute osseous abnormality identified. IMPRESSION: 1. Positive for Fournier's gangrene necrotizing fasciitis. Subcutaneous gas tracks from the scrotum cephalad to the superficial suprapubic soft tissues an inferior rectus muscles. This critical value/emergent finding was discussed by telephone with CHRISTOPHER LAWYER on 08/31/2015 at 17:02 . 2. Small volume of gas within the lumen of the urinary bladder. 3. No pelvic floor or retroperitoneal inflammation. Negative visualized lower abdominal viscera. 4. Severe calcified atherosclerosis of  the aorta, pelvic arteries and femoral arteries. Electronically Signed   By: Genevie Ann M.D.   On: 08/31/2015 17:04    Scheduled Meds: . atorvastatin  80 mg Oral q1800  . feeding supplement (PRO-STAT SUGAR FREE 64)  60 mL Oral BID  . heparin  5,000 Units Subcutaneous 3 times per day  . latanoprost  1 drop Both Eyes QHS  . metoprolol succinate  25 mg Oral BID  .  morphine injection  4 mg Intravenous Once  . piperacillin-tazobactam (ZOSYN)  IV  3.375 g Intravenous 3 times per day  . prednisoLONE acetate  1 drop Right Eye QID  . predniSONE  10 mg Oral BID WC  . sodium chloride flush  3 mL Intravenous Q12H  . vancomycin  750 mg Intravenous Q12H   Continuous Infusions: . sodium chloride 100 mL/hr (09/02/15 1037)       Time spent: 35 minutes    [facility] A  [facility] Pager [phone] If 7PM-7AM, please contact night-coverage at [email or web address], password [facility] 09/02/2015, 12:31 PM  LOS: 2 days

## 2015-09-02 NOTE — Anesthesia Procedure Notes (Signed)
Procedure Name: LMA Insertion Date/Time: 09/02/2015 12:41 PM Performed by: Anne Fu Pre-anesthesia Checklist: Patient identified, Emergency Drugs available, Suction available, Patient being monitored and Timeout performed Patient Re-evaluated:Patient Re-evaluated prior to inductionOxygen Delivery Method: Circle system utilized Preoxygenation: Pre-oxygenation with 100% oxygen Intubation Type: IV induction Ventilation: Mask ventilation without difficulty LMA: LMA inserted LMA Size: 4.0 Number of attempts: 1 Placement Confirmation: positive ETCO2 and breath sounds checked- equal and bilateral Tube secured with: Tape

## 2015-09-03 LAB — TISSUE CULTURE

## 2015-09-03 LAB — BASIC METABOLIC PANEL
Anion gap: 6 (ref 5–15)
BUN: 17 mg/dL (ref 6–20)
CALCIUM: 8.2 mg/dL — AB (ref 8.9–10.3)
CO2: 26 mmol/L (ref 22–32)
CREATININE: 0.38 mg/dL — AB (ref 0.61–1.24)
Chloride: 107 mmol/L (ref 101–111)
GFR calc Af Amer: >60 mL/min (ref >60)
Glucose, Bld: 123 mg/dL — ABNORMAL HIGH (ref 65–99)
Potassium: 3.7 mmol/L (ref 3.5–5.1)
SODIUM: 139 mmol/L (ref 135–145)

## 2015-09-03 NOTE — Progress Notes (Signed)
[facility] PROGRESS NOTE   Rodney [name] [id number] DOB: 27-Apr-1951 DOA: 08/31/2015 PCP: Simona Huh, MD  Subjective: Patient denies any fever or chills. Nothing by mouth for probable second look today.  HPI: [name] is a 65 year old male with a past medical history significant for CAD, PVD, alcohol use, and prostate cancer s/p radiation and prostatectomy, ; who presents for scrotal swelling and pain. He initially noted discomfort with walking approximately 5 days ago. He had been initially treated as an outpatient with ciprofloxacin prior to being admitted into the hospitalfrom 3/14 - 3/21 for scrotal cellulitis. Initially treated with cefepime and Zosyn and switch to by mouth antibiotics of doxycycline and cephalexin to follow-up with urology in 2 weeks. Upon admission into the emergency department patient was evaluated with a CT scan of the pelvis which revealed subcutaneous air tracking into the soft tissues suspected to be Fournier's gangrene. Patient was evaluated by urology and surgery and taken immediately to the operating room for I&D  Assessment/Plan: Principal Problem:   Fournier's gangrene Active Problems:   Anemia, normocytic normochromic   Leukocytosis   Benign essential HTN   Fournier's gangrene Acute. CT scan showed gas bubble extending from the perineum. Urology and general surgery emergently consulted, patient taken to the OR on 08/31/15 night. Status post debridement, no fever or chills, started on antibiotics. Status post second debridement on 09/02/2015, appears to be doing very well. Culture showed Serratia marcescens and Candida albicans will add Diflucan.  Leukocytosis WBC count 10.6 on admission likely secondary to above, was not febrile so did not meet sepsis criteria on admission.  Acute blood loss anemia Patient has hemoglobin of 9.7, he lost 100 mL perioperatively. Status post transfusion of 2 units of packed RBC, hemoglobin  11.2.  We'll follow WBCs with CBC in a.m.  Dehydration His creatinine is better than ever, he was discharged from the hospital on 08/22/15 with creatinine of 2.26. Creatinine now is 0.9. Continue IV fluids because of concurrent infection.  Essential hypertension -Continue metoprolol   Hyperlipidemia  - Continue atorvastatin   Hypokalemia Potassium of 3.2, repleted with oral supplements.   DVT prophylaxis: heparin  Code Status: Full Code Family Communication: Plan discussed with the patient. Disposition Plan: Remains inpatient Diet: Diet regular Room service appropriate?: Yes; Fluid consistency:: Thin Diet NPO time specified  Consultants:  None  Procedures:  Debridement of Fournier's gangrene  Antibiotics:  Vancomycin and Zosyn   Objective: Filed Vitals:   09/03/15 0500 09/03/15 0800  BP: 140/92   Pulse: 74   Temp:  98.8 F (37.1 C)  Resp: 13     Intake/Output Summary (Last 24 hours) at 09/03/15 1058 Last data filed at 09/03/15 0600  Gross per 24 hour  Intake 2856.67 ml  Output    655 ml  Net 2201.67 ml   Filed Weights   08/31/15 1615 08/31/15 2300  Weight: 60.782 kg (134 lb) 55.8 kg (123 lb 0.3 oz)    Exam: General: Alert and awake, oriented x3, not in any acute distress. HEENT: anicteric sclera, pupils reactive to light and accommodation, EOMI CVS: S1-S2 clear, no murmur rubs or gallops Chest: clear to auscultation bilaterally, no wheezing, rales or rhonchi Abdomen: soft nontender, nondistended, normal bowel sounds, no organomegaly Extremities: no cyanosis, clubbing or edema noted bilaterally Neuro: Cranial nerves II-XII intact, no focal neurological deficits  Data Reviewed: Basic Metabolic Panel:  Recent Labs Lab 08/31/15 1617 08/31/15 1629 08/31/15 1958 09/01/15 0445 09/02/15 0953 09/03/15 0318  NA 139 138 137  140 139 139  K 4.0 3.9 3.6 3.8 3.2* 3.7  CL 105 101  --  107 105 107  CO2 27  --   --  25 26 26   GLUCOSE 155* 150*  --   98 91 123*  BUN 28* 26*  --  22* 17 17  CREATININE 0.91 0.90  --  0.60* 0.60* 0.38*  CALCIUM 8.9  --   --  8.6* 8.5* 8.2*   Liver Function Tests: No results for input(s): AST, ALT, ALKPHOS, BILITOT, PROT, ALBUMIN in the last 168 hours. No results for input(s): LIPASE, AMYLASE in the last 168 hours. No results for input(s): AMMONIA in the last 168 hours. CBC:  Recent Labs Lab 08/31/15 1617 08/31/15 1629 08/31/15 1958 09/01/15 0445  WBC 10.6*  --   --  14.0*  NEUTROABS 9.5*  --   --   --   HGB 9.7* 10.2* 7.5* 11.2*  HCT 30.1* 30.0* 22.0* 33.0*  MCV 98.4  --   --  90.7  PLT 367  --   --  311   Cardiac Enzymes: No results for input(s): CKTOTAL, CKMB, CKMBINDEX, TROPONINI in the last 168 hours. BNP (last 3 results)  Recent Labs  08/15/15 1913  BNP 365.2*    ProBNP (last 3 results) No results for input(s): PROBNP in the last 8760 hours.  CBG: No results for input(s): GLUCAP in the last 168 hours.  Micro Recent Results (from the past 240 hour(s))  Tissue culture     Status: None   Collection Time: 08/31/15  6:55 PM  Result Value Ref Range Status   Specimen Description ABDOMEN TISSUE NECROTIC  Final   Special Requests NONE  Final   Gram Stain   Final    FEW WBC PRESENT,BOTH PMN AND MONONUCLEAR NO ORGANISMS SEEN Performed at [facility]    Culture   Final    [place] Performed at [facility]    Report Status 09/03/2015 FINAL  Final   Organism ID, Bacteria SERRATIA MARCESCENS  Final      Susceptibility   Serratia marcescens - MIC*    CEFAZOLIN >=64 RESISTANT Resistant     CEFEPIME <=1 SENSITIVE Sensitive     CEFTAZIDIME <=1 SENSITIVE Sensitive     CEFTRIAXONE <=1 SENSITIVE Sensitive     CIPROFLOXACIN <=0.25 SENSITIVE Sensitive     GENTAMICIN <=1 SENSITIVE Sensitive     TOBRAMYCIN 8 INTERMEDIATE Intermediate     TRIMETH/SULFA Value in next row Sensitive      <=20 SENSITIVE(NOTE)    * MODERATE  SERRATIA MARCESCENS  MRSA PCR Screening     Status: None   Collection Time: 08/31/15 11:33 PM  Result Value Ref Range Status   MRSA by PCR NEGATIVE NEGATIVE Final    Comment:        The GeneXpert MRSA Assay (FDA approved for NASAL specimens only), is one component of a comprehensive MRSA colonization surveillance program. It is not intended to diagnose MRSA infection nor to guide or monitor treatment for MRSA infections.      Studies: No results found.  Scheduled Meds: . atorvastatin  80 mg Oral q1800  . feeding supplement (PRO-STAT SUGAR FREE 64)  60 mL Oral BID  . fluconazole (DIFLUCAN) IV  400 mg Intravenous Q24H  . heparin  5,000 Units Subcutaneous 3 times per day  . latanoprost  1 drop Both Eyes QHS  . metoprolol succinate  25 mg Oral BID  .  morphine  injection  4 mg Intravenous Once  . piperacillin-tazobactam (ZOSYN)  IV  3.375 g Intravenous 3 times per day  . prednisoLONE acetate  1 drop Right Eye QID  . predniSONE  10 mg Oral BID WC  . sodium chloride flush  3 mL Intravenous Q12H  . vancomycin  750 mg Intravenous Q12H   Continuous Infusions: . sodium chloride 100 mL/hr at 09/03/15 0601       Time spent: 35 minutes    [facility] A  [facility] Pager [phone] If 7PM-7AM, please contact night-coverage at [email or web address], password [facility] 09/03/2015, 10:58 AM  LOS: 3 days

## 2015-09-03 NOTE — Progress Notes (Signed)
1 Day Post-Op  Subjective: Resting comfortably  Objective: Vital signs in last 24 hours: Temp:  [97.4 F (36.3 C)-97.9 F (36.6 C)] 97.9 F (36.6 C) (04/01 2000) Pulse Rate:  [39-86] 74 (04/02 0500) Resp:  [8-25] 13 (04/02 0500) BP: (107-159)/(61-113) 140/92 mmHg (04/02 0500) SpO2:  [89 %-100 %] 90 % (04/02 0500) Last BM Date:  (PTA)  Intake/Output from previous day: 04/01 0701 - 04/02 0700 In: 3106.7 [I.V.:2456.7; IV Piggyback:650] Out: 655 [Urine:650; Blood:5] Intake/Output this shift:    Resp: clear to auscultation bilaterally Cardio: regular rate and rhythm GI: soft, nontender Male genitalia: wounds examined in OR yesterday. reported as clean  Lab Results:   Recent Labs  08/31/15 1617  08/31/15 1958 09/01/15 0445  WBC 10.6*  --   --  14.0*  HGB 9.7*  < > 7.5* 11.2*  HCT 30.1*  < > 22.0* 33.0*  PLT 367  --   --  311  < > = values in this interval not displayed. BMET  Recent Labs  09/02/15 0953 09/03/15 0318  NA 139 139  K 3.2* 3.7  CL 105 107  CO2 26 26  GLUCOSE 91 123*  BUN 17 17  CREATININE 0.60* 0.38*  CALCIUM 8.5* 8.2*   PT/INR No results for input(s): LABPROT, INR in the last 72 hours. ABG  Recent Labs  08/31/15 1958  PHART 7.435  HCO3 26.7*    Studies/Results: No results found.  Anti-infectives: Anti-infectives    Start     Dose/Rate Route Frequency Ordered Stop   09/03/15 1800  fluconazole (DIFLUCAN) IVPB 400 mg     400 mg 100 mL/hr over 120 Minutes Intravenous Every 24 hours 09/02/15 1605     09/02/15 1700  fluconazole (DIFLUCAN) IVPB 800 mg     800 mg 100 mL/hr over 240 Minutes Intravenous  Once 09/02/15 1605 09/02/15 2108   09/01/15 0600  vancomycin (VANCOCIN) IVPB 750 mg/150 ml premix     750 mg 150 mL/hr over 60 Minutes Intravenous Every 12 hours 08/31/15 1708     09/01/15 0230  piperacillin-tazobactam (ZOSYN) IVPB 3.375 g     3.375 g 12.5 mL/hr over 240 Minutes Intravenous 3 times per day 09/01/15 0228     08/31/15  1715  piperacillin-tazobactam (ZOSYN) IVPB 3.375 g     3.375 g 12.5 mL/hr over 240 Minutes Intravenous  Once 08/31/15 1703 08/31/15 1915   08/31/15 1630  vancomycin (VANCOCIN) IVPB 1000 mg/200 mL premix     1,000 mg 200 mL/hr over 60 Minutes Intravenous STAT 08/31/15 1621 08/31/15 1730      Assessment/Plan: s/p Procedure(s): IRRIGATION AND DEBRIDEMENT OF SCROTUM, ABDOMEN AND PERINEUM  WITH DRESSING CHANGE (N/A) Continue abx  Will return to OR tomorrow for dressing change and possible debridement  LOS: 3 days    TOTH III,Shikha Bibb S 09/03/2015

## 2015-09-03 NOTE — Progress Notes (Addendum)
  Subjective: S/p debriedment on 08/31/15 & 09/02/15, doing well, afebrile, labs normalizing, making urine, dressing in place, pain controlled.  Wound culture: serrratia, candida  Objective: Vital signs in last 24 hours: Temp:  [97.4 F (36.3 C)-97.9 F (36.6 C)] 97.9 F (36.6 C) (04/01 2000) Pulse Rate:  [39-86] 74 (04/02 0500) Resp:  [8-21] 13 (04/02 0500) BP: (107-159)/(61-113) 140/92 mmHg (04/02 0500) SpO2:  [90 %-100 %] 90 % (04/02 0500)  Intake/Output from previous day: 04/01 0701 - 04/02 0700 In: 3106.7 [I.V.:2456.7; IV Piggyback:650] Out: 655 [Urine:650; Blood:5] Intake/Output this shift:    Physical Exam:  General: Alert and oriented CV: RRR Lungs: Clear Abdomen: Soft, ND, dressing in place without evidence of significant bleeding Ext: NT, No erythema  Lab Results: Lab Results  Component Value Date   WBC 14.0* 09/01/2015   HGB 11.2* 09/01/2015   HCT 33.0* 09/01/2015   MCV 90.7 09/01/2015   PLT 311 09/01/2015    BMET  Recent Labs  09/02/15 0953 09/03/15 0318  NA 139 139  K 3.2* 3.7  CL 105 107  CO2 26 26  GLUCOSE 91 123*  BUN 17 17  CREATININE 0.60* 0.38*  CALCIUM 8.5* 8.2*   Wound culture: serrratia, candida  Studies/Results: No results found.  Assessment/Plan: 65 y.o. male with Fournier's Gangrene POD 3/1 debridement/second look debridement, dressing change - Continue antimicrobials for serratia, candida - Pain management as needed - Plan to return to the OR on 09/04/15 with Dr. Risa Grill for dressing change. Possible wound debridement. - NPO after midnight for above   LOS: 3 days   Nickie Retort 09/03/2015, 9:43 AM

## 2015-09-04 ENCOUNTER — Inpatient Hospital Stay (HOSPITAL_COMMUNITY): Payer: 59

## 2015-09-04 ENCOUNTER — Encounter (HOSPITAL_COMMUNITY): Payer: Self-pay | Admitting: Urology

## 2015-09-04 ENCOUNTER — Inpatient Hospital Stay (HOSPITAL_COMMUNITY): Payer: 59 | Admitting: Anesthesiology

## 2015-09-04 ENCOUNTER — Encounter (HOSPITAL_COMMUNITY)
Admission: EM | Disposition: A | Discharge status: Nursing Home (Includes ICF) | Payer: Self-pay | Source: Home / Self Care | Attending: Internal Medicine

## 2015-09-04 HISTORY — PX: IRRIGATION AND DEBRIDEMENT ABSCESS: SHX5252

## 2015-09-04 LAB — TYPE AND SCREEN
ABO/RH(D): A POS
Antibody Screen: NEGATIVE
UNIT DIVISION: 0
UNIT DIVISION: 0
Unit division: 0
Unit division: 0

## 2015-09-04 LAB — VANCOMYCIN, TROUGH: VANCOMYCIN TR: 9 ug/mL — AB (ref 10.0–20.0)

## 2015-09-04 LAB — TROPONIN I
Troponin I: <0.03 ng/mL
Troponin I: <0.03 ng/mL
Troponin I: <0.03 ng/mL

## 2015-09-04 SURGERY — IRRIGATION AND DEBRIDEMENT ABSCESS
Anesthesia: General | Site: Abdomen

## 2015-09-04 MED ORDER — FENTANYL CITRATE (PF) 100 MCG/2ML IJ SOLN
INTRAMUSCULAR | Status: DC | PRN
  Administered 2015-09-04 (×2): 50 ug via INTRAVENOUS

## 2015-09-04 MED ORDER — VANCOMYCIN HCL IN DEXTROSE 1-5 GM/200ML-% IV SOLN
1000.0000 mg | Freq: Two times a day (BID) | INTRAVENOUS | Status: DC
  Administered 2015-09-04 – 2015-09-05 (×2): 1000 mg via INTRAVENOUS
  Filled 2015-09-04 (×5): qty 200

## 2015-09-04 MED ORDER — MIDAZOLAM HCL 2 MG/2ML IJ SOLN
INTRAMUSCULAR | Status: AC
  Filled 2015-09-04: qty 2

## 2015-09-04 MED ORDER — DEXTROSE 5 % IV SOLN
2.0000 g | INTRAVENOUS | Status: AC
  Administered 2015-09-04: 2 g via INTRAVENOUS
  Filled 2015-09-04: qty 2

## 2015-09-04 MED ORDER — ONDANSETRON HCL 4 MG/2ML IJ SOLN
INTRAMUSCULAR | Status: DC | PRN
  Administered 2015-09-04: 4 mg via INTRAVENOUS

## 2015-09-04 MED ORDER — FENTANYL CITRATE (PF) 100 MCG/2ML IJ SOLN
INTRAMUSCULAR | Status: AC
  Filled 2015-09-04: qty 2

## 2015-09-04 MED ORDER — PHENYLEPHRINE HCL 10 MG/ML IJ SOLN
INTRAMUSCULAR | Status: DC | PRN
  Administered 2015-09-04: 40 ug via INTRAVENOUS
  Administered 2015-09-04: 80 ug via INTRAVENOUS
  Administered 2015-09-04: 40 ug via INTRAVENOUS

## 2015-09-04 MED ORDER — PROPOFOL 10 MG/ML IV BOLUS
INTRAVENOUS | Status: AC
  Filled 2015-09-04: qty 20

## 2015-09-04 MED ORDER — HYDROMORPHONE HCL 1 MG/ML IJ SOLN
INTRAMUSCULAR | Status: AC
  Filled 2015-09-04: qty 1

## 2015-09-04 MED ORDER — LIDOCAINE HCL (CARDIAC) 20 MG/ML IV SOLN
INTRAVENOUS | Status: DC | PRN
  Administered 2015-09-04: 50 mg via INTRAVENOUS

## 2015-09-04 MED ORDER — HYDROMORPHONE HCL 1 MG/ML IJ SOLN
0.2500 mg | INTRAMUSCULAR | Status: DC | PRN
  Administered 2015-09-04: 0.5 mg via INTRAVENOUS

## 2015-09-04 MED ORDER — MEPERIDINE HCL 50 MG/ML IJ SOLN: 6.2500 mg | INTRAMUSCULAR | Status: DC | PRN

## 2015-09-04 MED ORDER — DEXTROSE 5 % IV SOLN
2.0000 g | Freq: Three times a day (TID) | INTRAVENOUS | Status: DC
  Administered 2015-09-04 – 2015-09-05 (×3): 2 g via INTRAVENOUS
  Filled 2015-09-04 (×4): qty 2

## 2015-09-04 MED ORDER — PROPOFOL 10 MG/ML IV BOLUS
INTRAVENOUS | Status: DC | PRN
  Administered 2015-09-04: 160 mg via INTRAVENOUS

## 2015-09-04 MED ORDER — METRONIDAZOLE IN NACL 5-0.79 MG/ML-% IV SOLN
500.0000 mg | Freq: Three times a day (TID) | INTRAVENOUS | Status: DC
  Administered 2015-09-04 – 2015-09-07 (×9): 500 mg via INTRAVENOUS
  Filled 2015-09-04 (×9): qty 100

## 2015-09-04 MED ORDER — MIDAZOLAM HCL 5 MG/5ML IJ SOLN
INTRAMUSCULAR | Status: DC | PRN
  Administered 2015-09-04: 1 mg via INTRAVENOUS

## 2015-09-04 MED ORDER — LIDOCAINE HCL (CARDIAC) 20 MG/ML IV SOLN
INTRAVENOUS | Status: AC
  Filled 2015-09-04: qty 5

## 2015-09-04 SURGICAL SUPPLY — 24 items
BANDAGE COBAN STERILE 2 (GAUZE/BANDAGES/DRESSINGS) ×2 IMPLANT
BLADE SURG 15 STRL LF DISP TIS (BLADE) ×1 IMPLANT
BLADE SURG 15 STRL SS (BLADE) ×1
BNDG GAUZE ELAST 4 BULKY (GAUZE/BANDAGES/DRESSINGS) ×6 IMPLANT
COVER SURGICAL LIGHT HANDLE (MISCELLANEOUS) ×4 IMPLANT
DECANTER SPIKE VIAL GLASS SM (MISCELLANEOUS) IMPLANT
DRAPE LAPAROTOMY T 102X78X121 (DRAPES) ×2 IMPLANT
DRSG PAD ABDOMINAL 8X10 ST (GAUZE/BANDAGES/DRESSINGS) ×8 IMPLANT
ELECT NEEDLE TIP 2.8 STRL (NEEDLE) ×2 IMPLANT
ELECT PENCIL ROCKER SW 15FT (MISCELLANEOUS) ×2 IMPLANT
ELECT REM PT RETURN 9FT ADLT (ELECTROSURGICAL) ×2
ELECTRODE REM PT RTRN 9FT ADLT (ELECTROSURGICAL) ×1 IMPLANT
GAUZE SPONGE 4X4 12PLY STRL (GAUZE/BANDAGES/DRESSINGS) ×2 IMPLANT
GAUZE SPONGE 4X4 16PLY XRAY LF (GAUZE/BANDAGES/DRESSINGS) ×2 IMPLANT
GLOVE BIOGEL M STRL SZ7.5 (GLOVE) ×2 IMPLANT
GOWN STRL REUS W/TWL XL LVL3 (GOWN DISPOSABLE) ×2 IMPLANT
KIT BASIN OR (CUSTOM PROCEDURE TRAY) ×2 IMPLANT
NEEDLE HYPO 25X1 1.5 SAFETY (NEEDLE) IMPLANT
NS IRRIG 1000ML POUR BTL (IV SOLUTION) ×6 IMPLANT
PACK BASIC VI WITH GOWN DISP (CUSTOM PROCEDURE TRAY) ×2 IMPLANT
SUT VIC AB 4-0 PS2 27 (SUTURE) ×2 IMPLANT
SUT VIC AB 5-0 RB1 27 (SUTURE) ×4 IMPLANT
SYR CONTROL 10ML LL (SYRINGE) IMPLANT
WATER STERILE IRR 1500ML POUR (IV SOLUTION) ×2 IMPLANT

## 2015-09-04 NOTE — Progress Notes (Signed)
Pharmacy Antibiotic Note  STANISLAV CZERWONKA is a 65 y.o. male admitted on 08/31/2015 with necrotizing wound infection.  Currently on Day 5 vancomycin and Zosyn for Fournier's gangrene and Fluconazole Day 3 for Candida growing on tissue culture. Today, Zosyn changed to Ceftazidime per sensitivities for Serratia, and Flagyl added per MD for anaerobic coverage.  Vancomycin trough level = 9 (due at 1700, but drawn late at 1900 due to OR I&D, dressing change)  Plan: Increase to Vancomycin 1000 mg IV q12h. Follow up narrowing antibiotic spectrum, or recheck Vanc trough at new steady state if continued.  Goal VT 15-20. Follow up renal fxn, culture results, and clinical course.  Thank you for allowing pharmacy to be a part of this patient's care.  Gretta Arab PharmD, BCPS Pager [phone] 09/04/2015 7:45 PM

## 2015-09-04 NOTE — Progress Notes (Signed)
Day of Surgery Subjective: Patient reports that he is comfortable this evening.  He has no new complaints.  He appears to be in good spirits.  Objective: Vital signs in last 24 hours: Temp:  [97.3 F (36.3 C)-98.3 F (36.8 C)] 97.3 F (36.3 C) (04/03 1600) Pulse Rate:  [55-98] 98 (04/03 1600) Resp:  [7-19] 15 (04/03 1600) BP: (109-166)/(66-116) 121/67 mmHg (04/03 1600) SpO2:  [84 %-100 %] 97 % (04/03 1600)  Intake/Output from previous day: 04/02 0701 - 04/03 0700 In: 3280 [I.V.:2310; IV Piggyback:850] Out: 1750 [Urine:1750] Intake/Output this shift: Total I/O In: 1090 [I.V.:900; Other:90; IV Piggyback:100] Out: 350 [Urine:350]  Physical Exam:   Abdominal: dressings in place.  No obvious crepitance on periphery debridement zone. Genitourinary:repeat exam pending surgical exploration   Lab Results: No results for input(s): HGB, HCT in the last 72 hours. BMET  Recent Labs  09/02/15 0953 09/03/15 0318  NA 139 139  K 3.2* 3.7  CL 105 107  CO2 26 26  GLUCOSE 91 123*  BUN 17 17  CREATININE 0.60* 0.38*  CALCIUM 8.5* 8.2*   No results for input(s): LABPT, INR in the last 72 hours. No results for input(s): LABURIN in the last 72 hours. Results for orders placed or performed during the hospital encounter of 08/31/15  Tissue culture     Status: None   Collection Time: 08/31/15  6:55 PM  Result Value Ref Range Status   Specimen Description ABDOMEN TISSUE NECROTIC  Final   Special Requests NONE  Final   Gram Stain   Final    FEW WBC PRESENT,BOTH PMN AND MONONUCLEAR NO ORGANISMS SEEN Performed at [facility]    Culture   Final    [place] Performed at [facility]    Report Status 09/03/2015 FINAL  Final   Organism ID, Bacteria SERRATIA MARCESCENS  Final      Susceptibility   Serratia marcescens - MIC*    CEFAZOLIN >=64 RESISTANT Resistant     CEFEPIME <=1 SENSITIVE Sensitive     CEFTAZIDIME <=1  SENSITIVE Sensitive     CEFTRIAXONE <=1 SENSITIVE Sensitive     CIPROFLOXACIN <=0.25 SENSITIVE Sensitive     GENTAMICIN <=1 SENSITIVE Sensitive     TOBRAMYCIN 8 INTERMEDIATE Intermediate     TRIMETH/SULFA Value in next row Sensitive      <=20 SENSITIVE(NOTE)    * MODERATE SERRATIA MARCESCENS  MRSA PCR Screening     Status: None   Collection Time: 08/31/15 11:33 PM  Result Value Ref Range Status   MRSA by PCR NEGATIVE NEGATIVE Final    Comment:        The GeneXpert MRSA Assay (FDA approved for NASAL specimens only), is one component of a comprehensive MRSA colonization surveillance program. It is not intended to diagnose MRSA infection nor to guide or monitor treatment for MRSA infections.     Studies/Results: Dg Chest Port 1 View  09/04/2015  CLINICAL DATA:  Hypoxia EXAM: PORTABLE CHEST 1 VIEW COMPARISON:  08/15/2015 FINDINGS: Cardiac shadow is stable. The lungs are well aerated bilaterally. Mild interstitial changes are noted. No focal confluent infiltrate is seen. No acute bony abnormality is noted. IMPRESSION: No acute abnormality seen. Electronically Signed   By: Inez Catalina M.D.   On: 09/04/2015 02:53    Assessment/Plan:   For repeat wound examination under anesthesia with dressing change.  Hopefully dressing changes can be accomplished on the floor with some sedation/IV pain medication.  We'll plan  on debridement if necessary with change of dressings under anesthesia.  General surgery reassessment if indicated.  This will be done by myself or the on-call physician depending on what time the OR is available   LOS: 4 days   Niamya Vittitow S 09/04/2015, 5:22 PM

## 2015-09-04 NOTE — Anesthesia Preprocedure Evaluation (Signed)
Anesthesia Evaluation  Patient identified by MRN, date of birth, ID band Patient awake    Reviewed: Allergy & Precautions, NPO status , Patient's Chart, lab work & pertinent test results  Airway Mallampati: II  TM Distance: >3 FB Neck ROM: Full    Dental no notable dental hx.    Pulmonary neg pulmonary ROS, former smoker,    Pulmonary exam normal breath sounds clear to auscultation       Cardiovascular hypertension, Pt. on medications + CAD and + Peripheral Vascular Disease  Normal cardiovascular exam+ Valvular Problems/Murmurs  Rhythm:Regular Rate:Normal     Neuro/Psych PSYCHIATRIC DISORDERS negative neurological ROS     GI/Hepatic negative GI ROS, (+)     substance abuse  alcohol use,   Endo/Other  negative endocrine ROS  Renal/GU Renal diseasenegative Renal ROS     Musculoskeletal negative musculoskeletal ROS (+)   Abdominal   Peds  Hematology negative hematology ROS (+) anemia ,   Anesthesia Other Findings   Reproductive/Obstetrics negative OB ROS                             Anesthesia Physical  Anesthesia Plan  ASA: III  Anesthesia Plan: General   Post-op Pain Management:    Induction: Intravenous  Airway Management Planned: LMA  Additional Equipment:   Intra-op Plan:   Post-operative Plan: Extubation in OR  Informed Consent: I have reviewed the patients History and Physical, chart, labs and discussed the procedure including the risks, benefits and alternatives for the proposed anesthesia with the patient or authorized representative who has indicated his/her understanding and acceptance.   Dental advisory given  Plan Discussed with: CRNA and Surgeon  Anesthesia Plan Comments:         Anesthesia Quick Evaluation

## 2015-09-04 NOTE — Anesthesia Procedure Notes (Signed)
Procedure Name: LMA Insertion Date/Time: 09/04/2015 6:28 PM Performed by: Noralyn Pick D Pre-anesthesia Checklist: Patient identified, Emergency Drugs available, Suction available and Patient being monitored Patient Re-evaluated:Patient Re-evaluated prior to inductionOxygen Delivery Method: Circle System Utilized Preoxygenation: Pre-oxygenation with 100% oxygen Intubation Type: IV induction Ventilation: Mask ventilation without difficulty LMA Size: 4.0 Number of attempts: 1 Placement Confirmation: positive ETCO2 and breath sounds checked- equal and bilateral Tube secured with: Tape Dental Injury: Teeth and Oropharynx as per pre-operative assessment

## 2015-09-04 NOTE — Anesthesia Postprocedure Evaluation (Signed)
Anesthesia Post Note  Patient: Rodney [name]  Procedure(s) Performed: Procedure(s) (LRB): IRRIGATION AND DEBRIDEMENT and dressing change ofABSCESS (N/A)  Patient location during evaluation: PACU Anesthesia Type: General Level of consciousness: sedated and patient cooperative Pain management: pain level controlled Vital Signs Assessment: post-procedure vital signs reviewed and stable Respiratory status: spontaneous breathing Cardiovascular status: stable Anesthetic complications: no    Last Vitals:  Filed Vitals:   09/04/15 1928 09/04/15 1949  BP: 153/89 147/97  Pulse: 83 82  Temp: 36.4 C 36.3 C  Resp: 14 16    Last Pain:  Filed Vitals:   09/04/15 1949  PainSc: 0-No pain                 Nolon Nations

## 2015-09-04 NOTE — Transfer of Care (Signed)
Immediate Anesthesia Transfer of Care Note  Patient: Rodney [name]  Procedure(s) Performed: Procedure(s) with comments: IRRIGATION AND DEBRIDEMENT and dressing change ofABSCESS (N/A) - to penis and scrotum  Patient Location: PACU  Anesthesia Type:General  Level of Consciousness: awake, alert  and oriented  Airway & Oxygen Therapy: Patient Spontanous Breathing and Patient connected to nasal cannula oxygen  Post-op Assessment: Report given to RN and Post -op Vital signs reviewed and stable  Post vital signs: Reviewed and stable  Last Vitals:  Filed Vitals:   09/04/15 1500 09/04/15 1600  BP: 149/73 121/67  Pulse: 77 98  Temp:  36.3 C  Resp: 10 15    Complications: No apparent anesthesia complications

## 2015-09-04 NOTE — Progress Notes (Signed)
[facility] PROGRESS NOTE   [name] [id number] DOB: 1950/06/25 DOA: 08/31/2015 PCP: Simona Huh, MD  Subjective: Feels okay, denies any complaints. Urology plans noted to return to the OR on 09/04/2015 with Dr. Risa Grill for debridement/dressing changes.  HPI: Rodney [name] is a 65 year old male with a past medical history significant for CAD, PVD, alcohol use, and prostate cancer s/p radiation and prostatectomy, ; who presents for scrotal swelling and pain. He initially noted discomfort with walking approximately 5 days ago. He had been initially treated as an outpatient with ciprofloxacin prior to being admitted into the hospitalfrom 3/14 - 3/21 for scrotal cellulitis. Initially treated with cefepime and Zosyn and switch to by mouth antibiotics of doxycycline and cephalexin to follow-up with urology in 2 weeks. Upon admission into the emergency department patient was evaluated with a CT scan of the pelvis which revealed subcutaneous air tracking into the soft tissues suspected to be Fournier's gangrene. Patient was evaluated by urology and surgery and taken immediately to the operating room for I&D  Assessment/Plan: Principal Problem:   Fournier's gangrene Active Problems:   Anemia, normocytic normochromic   Leukocytosis   Benign essential HTN   Fournier's gangrene Acute. CT scan showed gas bubble extending from the perineum. Urology and general surgery emergently consulted, patient taken to the OR on 08/31/15 night. Status post debridement, no fever or chills, started on antibiotics. Status post second debridement on 09/02/2015, appears to be doing very well. Culture showed Serratia marcescens and Candida albicans will add Diflucan. Patient is on Zosyn, vancomycin and Diflucan, continue current antibiotics. These types of infection usually polymicrobial, on discharge might only give antibiotics to cover Serratia and Candida.  Sinus pause Had a pause 0.67 seconds,  was asymptomatic. Had brief sinus bradycardia followed by the sinus pause. Patient is on metoprolol wall discontinue for now. Continue cardiac monitoring.  Leukocytosis WBC count 10.6 on admission likely secondary to above, was not febrile so did not meet sepsis criteria on admission.  Acute blood loss anemia Patient has hemoglobin of 9.7, he lost 100 mL perioperatively. Status post transfusion of 2 units of packed RBC, hemoglobin 11.2.  We'll follow WBCs with CBC in a.m.  Dehydration His creatinine is better than ever, he was discharged from the hospital on 08/22/15 with creatinine of 2.26. Creatinine now is 0.9. Continue IV fluids because of concurrent infection.  Essential hypertension -Continue metoprolol   Hyperlipidemia  - Continue atorvastatin   Hypokalemia Potassium of 3.2, repleted with oral supplements.   DVT prophylaxis: heparin  Code Status: Full Code Family Communication: Plan discussed with the patient. Disposition Plan: Remains inpatient Diet: Diet NPO time specified  Consultants:  None  Procedures:  Debridement of Fournier's gangrene  Antibiotics:  Vancomycin and Zosyn   Objective: Filed Vitals:   09/04/15 1000 09/04/15 1100  BP: 139/73 144/116  Pulse: 78 80  Temp:    Resp: 19 15    Intake/Output Summary (Last 24 hours) at 09/04/15 1148 Last data filed at 09/04/15 0916  Gross per 24 hour  Intake   2980 ml  Output   1785 ml  Net   1195 ml   Filed Weights   08/31/15 1615 08/31/15 2300  Weight: 60.782 kg (134 lb) 55.8 kg (123 lb 0.3 oz)    Exam: General: Alert and awake, oriented x3, not in any acute distress. HEENT: anicteric sclera, pupils reactive to light and accommodation, EOMI CVS: S1-S2 clear, no murmur rubs or gallops Chest: clear to auscultation bilaterally, no wheezing, rales  or rhonchi Abdomen: soft nontender, nondistended, normal bowel sounds, no organomegaly Extremities: no cyanosis, clubbing or edema noted  bilaterally Neuro: Cranial nerves II-XII intact, no focal neurological deficits  Data Reviewed: Basic Metabolic Panel:  Recent Labs Lab 08/31/15 1617 08/31/15 1629 08/31/15 1958 09/01/15 0445 09/02/15 0953 09/03/15 0318  NA 139 138 137 140 139 139  K 4.0 3.9 3.6 3.8 3.2* 3.7  CL 105 101  --  107 105 107  CO2 27  --   --  25 26 26   GLUCOSE 155* 150*  --  98 91 123*  BUN 28* 26*  --  22* 17 17  CREATININE 0.91 0.90  --  0.60* 0.60* 0.38*  CALCIUM 8.9  --   --  8.6* 8.5* 8.2*   Liver Function Tests: No results for input(s): AST, ALT, ALKPHOS, BILITOT, PROT, ALBUMIN in the last 168 hours. No results for input(s): LIPASE, AMYLASE in the last 168 hours. No results for input(s): AMMONIA in the last 168 hours. CBC:  Recent Labs Lab 08/31/15 1617 08/31/15 1629 08/31/15 1958 09/01/15 0445  WBC 10.6*  --   --  14.0*  NEUTROABS 9.5*  --   --   --   HGB 9.7* 10.2* 7.5* 11.2*  HCT 30.1* 30.0* 22.0* 33.0*  MCV 98.4  --   --  90.7  PLT 367  --   --  311   Cardiac Enzymes:  Recent Labs Lab 09/04/15 0232 09/04/15 0752  TROPONINI <0.03 <0.03   BNP (last 3 results)  Recent Labs  08/15/15 1913  BNP 365.2*    ProBNP (last 3 results) No results for input(s): PROBNP in the last 8760 hours.  CBG: No results for input(s): GLUCAP in the last 168 hours.  Micro Recent Results (from the past 240 hour(s))  Tissue culture     Status: None   Collection Time: 08/31/15  6:55 PM  Result Value Ref Range Status   Specimen Description ABDOMEN TISSUE NECROTIC  Final   Special Requests NONE  Final   Gram Stain   Final    FEW WBC PRESENT,BOTH PMN AND MONONUCLEAR NO ORGANISMS SEEN Performed at [facility]    Culture   Final    [place] Performed at [facility]    Report Status 09/03/2015 FINAL  Final   Organism ID, Bacteria SERRATIA MARCESCENS  Final      Susceptibility   Serratia marcescens - MIC*    CEFAZOLIN  >=64 RESISTANT Resistant     CEFEPIME <=1 SENSITIVE Sensitive     CEFTAZIDIME <=1 SENSITIVE Sensitive     CEFTRIAXONE <=1 SENSITIVE Sensitive     CIPROFLOXACIN <=0.25 SENSITIVE Sensitive     GENTAMICIN <=1 SENSITIVE Sensitive     TOBRAMYCIN 8 INTERMEDIATE Intermediate     TRIMETH/SULFA Value in next row Sensitive      <=20 SENSITIVE(NOTE)    * MODERATE SERRATIA MARCESCENS  MRSA PCR Screening     Status: None   Collection Time: 08/31/15 11:33 PM  Result Value Ref Range Status   MRSA by PCR NEGATIVE NEGATIVE Final    Comment:        The GeneXpert MRSA Assay (FDA approved for NASAL specimens only), is one component of a comprehensive MRSA colonization surveillance program. It is not intended to diagnose MRSA infection nor to guide or monitor treatment for MRSA infections.      Studies: Dg Chest Port 1 View  09/04/2015  CLINICAL DATA:  Hypoxia EXAM:  PORTABLE CHEST 1 VIEW COMPARISON:  08/15/2015 FINDINGS: Cardiac shadow is stable. The lungs are well aerated bilaterally. Mild interstitial changes are noted. No focal confluent infiltrate is seen. No acute bony abnormality is noted. IMPRESSION: No acute abnormality seen. Electronically Signed   By: Inez Catalina M.D.   On: 09/04/2015 02:53    Scheduled Meds: . atorvastatin  80 mg Oral q1800  . feeding supplement (PRO-STAT SUGAR FREE 64)  60 mL Oral BID  . fluconazole (DIFLUCAN) IV  400 mg Intravenous Q24H  . heparin  5,000 Units Subcutaneous 3 times per day  . latanoprost  1 drop Both Eyes QHS  . metoprolol succinate  25 mg Oral BID  .  morphine injection  4 mg Intravenous Once  . piperacillin-tazobactam (ZOSYN)  IV  3.375 g Intravenous 3 times per day  . prednisoLONE acetate  1 drop Right Eye QID  . predniSONE  10 mg Oral BID WC  . sodium chloride flush  3 mL Intravenous Q12H  . vancomycin  750 mg Intravenous Q12H   Continuous Infusions: . sodium chloride 100 mL/hr at 09/04/15 0434       Time spent: 35  minutes    [facility]  [facility] Pager [phone] If 7PM-7AM, please contact night-coverage at [email or web address], password [facility] 09/04/2015, 11:48 AM  LOS: 4 days

## 2015-09-04 NOTE — Progress Notes (Signed)
2 Days Post-Op  Subjective: Comfortable.  Objective: Vital signs in last 24 hours: Temp:  [97.5 F (36.4 C)-98.3 F (36.8 C)] 97.5 F (36.4 C) (04/03 0400) Pulse Rate:  [55-96] 73 (04/03 0500) Resp:  [7-17] 11 (04/03 0500) BP: (113-165)/(63-119) 160/81 mmHg (04/03 0500) SpO2:  [84 %-99 %] 96 % (04/03 0500) Last BM Date:  (PTA)  Intake/Output from previous day: 04/02 0701 - 04/03 0700 In: 3280 [I.V.:2310; IV Piggyback:850] Out: 1750 [Urine:1750] Intake/Output this shift:    PE: General- In NAD GU-wound not examined  Lab Results:  No results for input(s): WBC, HGB, HCT, PLT in the last 72 hours. BMET  Recent Labs  09/02/15 0953 09/03/15 0318  NA 139 139  K 3.2* 3.7  CL 105 107  CO2 26 26  GLUCOSE 91 123*  BUN 17 17  CREATININE 0.60* 0.38*  CALCIUM 8.5* 8.2*   PT/INR No results for input(s): LABPROT, INR in the last 72 hours. Comprehensive Metabolic Panel:    Component Value Date/Time   NA 139 09/03/2015 0318   NA 139 09/02/2015 0953   K 3.7 09/03/2015 0318   K 3.2* 09/02/2015 0953   CL 107 09/03/2015 0318   CL 105 09/02/2015 0953   CO2 26 09/03/2015 0318   CO2 26 09/02/2015 0953   BUN 17 09/03/2015 0318   BUN 17 09/02/2015 0953   CREATININE 0.38* 09/03/2015 0318   CREATININE 0.60* 09/02/2015 0953   GLUCOSE 123* 09/03/2015 0318   GLUCOSE 91 09/02/2015 0953   CALCIUM 8.2* 09/03/2015 0318   CALCIUM 8.5* 09/02/2015 0953   AST 14* 08/17/2015 0401   AST 26 08/15/2015 1912   ALT 27 08/17/2015 0401   ALT 35 08/15/2015 1912   ALKPHOS 66 08/17/2015 0401   ALKPHOS 82 08/15/2015 1912   BILITOT 0.7 08/17/2015 0401   BILITOT 0.8 08/15/2015 1912   PROT 4.4* 08/17/2015 0401   PROT 4.8* 08/15/2015 1912   ALBUMIN 1.9* 08/17/2015 0401   ALBUMIN 2.1* 08/15/2015 1912     Studies/Results: Dg Chest Port 1 View  09/04/2015  CLINICAL DATA:  Hypoxia EXAM: PORTABLE CHEST 1 VIEW COMPARISON:  08/15/2015 FINDINGS: Cardiac shadow is stable. The lungs are well aerated  bilaterally. Mild interstitial changes are noted. No focal confluent infiltrate is seen. No acute bony abnormality is noted. IMPRESSION: No acute abnormality seen. Electronically Signed   By: Inez Catalina M.D.   On: 09/04/2015 02:53    Anti-infectives: Anti-infectives    Start     Dose/Rate Route Frequency Ordered Stop   09/03/15 1800  fluconazole (DIFLUCAN) IVPB 400 mg     400 mg 100 mL/hr over 120 Minutes Intravenous Every 24 hours 09/02/15 1605     09/02/15 1700  fluconazole (DIFLUCAN) IVPB 800 mg     800 mg 100 mL/hr over 240 Minutes Intravenous  Once 09/02/15 1605 09/02/15 2108   09/01/15 0600  vancomycin (VANCOCIN) IVPB 750 mg/150 ml premix     750 mg 150 mL/hr over 60 Minutes Intravenous Every 12 hours 08/31/15 1708     09/01/15 0230  piperacillin-tazobactam (ZOSYN) IVPB 3.375 g     3.375 g 12.5 mL/hr over 240 Minutes Intravenous 3 times per day 09/01/15 0228     08/31/15 1715  piperacillin-tazobactam (ZOSYN) IVPB 3.375 g     3.375 g 12.5 mL/hr over 240 Minutes Intravenous  Once 08/31/15 1703 08/31/15 1915   08/31/15 1630  vancomycin (VANCOCIN) IVPB 1000 mg/200 mL premix     1,000 mg 200 mL/hr over  60 Minutes Intravenous STAT 08/31/15 1621 08/31/15 1730      Assessment Principal Problem:   Fournier's gangrene of scrotum s/p debridements     LOS: 4 days   Plan: Back to OR today per Urology for dressing change.  Will check wound with them in OR.   Amma Crear J 09/04/2015

## 2015-09-04 NOTE — Progress Notes (Signed)
Pharmacy Antibiotic Note  Rodney [name] is a 65 y.o. male admitted on 08/31/2015 with necrotizing wound infection.  Currently on Day 5 vancomycin and Zosyn for Fournier's gangrene and Fluconazole Day 3 for Candida growing on tissue culture. Today, Zosyn changed to Ceftazidime per sensitivities for Serratia, and Flagyl added per MD for anaerobic coverage.  Plan:  Ceftazidime 2g IV q8h.  Flagyl 500mg  IV q8h added per d/w MD for anaerobic coverage.  Continue Fluconazole 400 mg IV q24h.    Per MD, to continue Vancomycin despite GNR and yeast on tissue culture, as these types of infections polymicrobial. Therefore, will check Vancomycin trough level prior to next dose due at 1800 today. Goal trough level 15-20 mcg/mL.  F/u SCr, clinical course.  Height: 5\' 8"  (172.7 cm) Weight: 123 lb 0.3 oz (55.8 kg) IBW/kg (Calculated) : 68.4  Temp (24hrs), Avg:97.7 F (36.5 C), Min:97.3 F (36.3 C), Max:98.3 F (36.8 C)   Recent Labs Lab 08/31/15 1617 08/31/15 1629 08/31/15 1630 09/01/15 0445 09/02/15 0953 09/03/15 0318  WBC 10.6*  --   --  14.0*  --   --   CREATININE 0.91 0.90  --  0.60* 0.60* 0.38*  LATICACIDVEN  --   --  1.31  --   --   --     Estimated Creatinine Clearance: 74.6 mL/min (by C-G formula based on Cr of 0.38).    Allergies  Allergen Reactions  . Other Rash    polyester and metals except titanium.    Antimicrobials this admission: 3/30 Vancomycin >>  3/30 Zosyn >> 4/3  4/1 Fluconazole >> 4/3 Ceftazidime >> 4/3 Flagyl >>  Dose adjustments this admission: Last admission: 3/16 VT = 16, but not at steady state d/t renal dysfunction and dose changes. 4/3 1700 VT: pending  Microbiology results: 3/30 tissue cx: mod Serratia marcescens, Candida albicans  3/30 MRSA PCR: neg   Thank you for allowing pharmacy to be a part of this patient's care.   Lindell Spar, PharmD, BCPS Pager: [phone] 09/04/2015 2:45 PM

## 2015-09-04 NOTE — Progress Notes (Signed)
Pt.had 0.67 second sinus pause. He was asymptomatic resting in bed. Had some brief episodes of sinus brady with heart rate decreasing to 48 bpm. Pt.had some brief episodes of desaturation with the lowest being 81% on room air. Pt.was initially placed on 2 L [place] of oxygen and was eventually titrated up to 5 L [place]. [email or web address] to had brief episodes of desaturation only while sleeping with the lowest being  87% on oxygen. Apnea alarms alarmed. On call provider with [facility] called and notified. New orders were written. Will continue to monitor.

## 2015-09-04 NOTE — Progress Notes (Signed)
Date:  September 04, 2015 Chart reviewed for concurrent status and case management needs. Will continue to follow patient for changes and needs:  Returning to or today [id number] for debridement of wound and recheck of wound status due to necroses of wound bed.  Velva Harman, BSN, [place], [place]   [phone]

## 2015-09-04 NOTE — Brief Op Note (Signed)
08/31/2015 - 09/04/2015  6:44 PM  PATIENT:  Rodney [name]  65 y.o. male  PRE-OPERATIVE DIAGNOSIS:  fournier's gangrene  POST-OPERATIVE DIAGNOSIS:  fournier's gangrene  PROCEDURE:  Procedure(s) with comments: IRRIGATION AND DEBRIDEMENT and dressing change ofABSCESS (N/A) - to penis and scrotum  SURGEON:  Surgeon(s) and Role:    * Irine Seal, MD - Primary  PHYSICIAN ASSISTANT:   ASSISTANTS: none   ANESTHESIA:   general  EBL:  Total I/O In: [id number] [I.V.:900; Other:90; IV Piggyback:100] Out: 350 [Urine:350]  BLOOD ADMINISTERED:none  DRAINS: none   LOCAL MEDICATIONS USED:  NONE  SPECIMEN:  No Specimen  DISPOSITION OF SPECIMEN:  N/A  COUNTS:  YES  TOURNIQUET:  * No tourniquets in log *  DICTATION: .Other Dictation: Dictation Number [id number]  PLAN OF CARE: Admit to inpatient   PATIENT DISPOSITION:  PACU - hemodynamically stable.   Delay start of Pharmacological VTE agent (>24hrs) due to surgical blood loss or risk of bleeding: not applicable

## 2015-09-05 ENCOUNTER — Encounter (HOSPITAL_COMMUNITY): Payer: Self-pay | Admitting: Urology

## 2015-09-05 DIAGNOSIS — B9689 Other specified bacterial agents as the cause of diseases classified elsewhere: Secondary | ICD-10-CM

## 2015-09-05 DIAGNOSIS — B3749 Other urogenital candidiasis: Secondary | ICD-10-CM

## 2015-09-05 DIAGNOSIS — N493 Fournier gangrene: Principal | ICD-10-CM

## 2015-09-05 LAB — BASIC METABOLIC PANEL
ANION GAP: 9 (ref 5–15)
BUN: 16 mg/dL (ref 6–20)
CHLORIDE: 105 mmol/L (ref 101–111)
CO2: 26 mmol/L (ref 22–32)
Calcium: 8.5 mg/dL — ABNORMAL LOW (ref 8.9–10.3)
Creatinine, Ser: 0.57 mg/dL — ABNORMAL LOW (ref 0.61–1.24)
GFR calc Af Amer: >60 mL/min (ref >60)
GLUCOSE: 95 mg/dL (ref 65–99)
POTASSIUM: 3.1 mmol/L — AB (ref 3.5–5.1)
Sodium: 140 mmol/L (ref 135–145)

## 2015-09-05 LAB — CBC
HEMATOCRIT: 34.8 % — AB (ref 39.0–52.0)
HEMOGLOBIN: 11.2 g/dL — AB (ref 13.0–17.0)
MCH: 30.6 pg (ref 26.0–34.0)
MCHC: 32.2 g/dL (ref 30.0–36.0)
MCV: 95.1 fL (ref 78.0–100.0)
Platelets: 379 10*3/uL (ref 150–400)
RBC: 3.66 MIL/uL — ABNORMAL LOW (ref 4.22–5.81)
RDW: 15.8 % — AB (ref 11.5–15.5)
WBC: 14.8 10*3/uL — ABNORMAL HIGH (ref 4.0–10.5)

## 2015-09-05 MED ORDER — HYDRALAZINE HCL 20 MG/ML IJ SOLN
10.0000 mg | Freq: Once | INTRAMUSCULAR | Status: AC
  Administered 2015-09-05: 10 mg via INTRAVENOUS
  Filled 2015-09-05: qty 1

## 2015-09-05 MED ORDER — DEXTROSE 5 % IV SOLN
2.0000 g | INTRAVENOUS | Status: DC
  Administered 2015-09-05 – 2015-09-06 (×2): 2 g via INTRAVENOUS
  Filled 2015-09-05 (×3): qty 2

## 2015-09-05 MED ORDER — POTASSIUM CHLORIDE CRYS ER 20 MEQ PO TBCR
60.0000 meq | EXTENDED_RELEASE_TABLET | Freq: Four times a day (QID) | ORAL | Status: AC
  Administered 2015-09-05 (×2): 60 meq via ORAL
  Filled 2015-09-05 (×2): qty 3

## 2015-09-05 NOTE — Progress Notes (Signed)
[facility] PROGRESS NOTE   Rodney [name] [id number] DOB: 1950-09-28 DOA: 08/31/2015 PCP: Rodney Huh, MD  Subjective: Denies any fever or chills, denies any significant complaints.  HPI: [name] is a 65 year old male with a past medical history significant for CAD, PVD, alcohol use, and prostate cancer s/p radiation and prostatectomy, ; who presents for scrotal swelling and pain. He initially noted discomfort with walking approximately 5 days ago. He had been initially treated as an outpatient with ciprofloxacin prior to being admitted into the hospitalfrom 3/14 - 3/21 for scrotal cellulitis. Initially treated with cefepime and Zosyn and switch to by mouth antibiotics of doxycycline and cephalexin to follow-up with urology in 2 weeks. Upon admission into the emergency department patient was evaluated with a CT scan of the pelvis which revealed subcutaneous air tracking into the soft tissues suspected to be Fournier's gangrene. Patient was evaluated by urology and surgery and taken immediately to the operating room for I&D  Assessment/Plan: Principal Problem:   Fournier's gangrene Active Problems:   Anemia, normocytic normochromic   Leukocytosis   Benign essential HTN   Fournier's gangrene Acute. CT scan showed gas bubble extending from the perineum. Urology and general surgery emergently consulted, patient taken to the OR on 08/31/15 night. Status post debridement, no fever or chills, started on antibiotics. Status post second debridement on 09/02/2015, third debridement on 09/04/2015. Culture showed Serratia marcescens and Candida albicans will add Diflucan. Zosyn switched to [facility] (pharmacy recommendation), on vancomycin, Diflucan and Flagyl. ID to evaluate and to follow for antibiotics recommendation.  Sinus pause Had a pause 0.67 seconds, was asymptomatic. Had brief sinus bradycardia followed by the sinus pause. Patient is on metoprolol wall discontinue  for now. Continue cardiac monitoring, no further sinus pauses.  Leukocytosis WBC count 10.6 on admission likely secondary to above, was not febrile so did not meet sepsis criteria on admission.  Acute blood loss anemia Patient has hemoglobin of 9.7, he lost 100 mL perioperatively. Status post transfusion of 2 units of packed RBC, hemoglobin 11.2.  We'll follow WBCs with CBC in a.m.  Dehydration His creatinine is better than ever, he was discharged from the hospital on 08/22/15 with creatinine of 2.26. Creatinine now is 0.9. Continue IV fluids because of concurrent infection.  Essential hypertension -Continue metoprolol   Hyperlipidemia  - Continue atorvastatin   Hypokalemia Potassium of 3.1 replete with oral supplements.   DVT prophylaxis: heparin  Code Status: Full Code Family Communication: Plan discussed with the patient. Disposition Plan: Remains inpatient Diet: Diet regular Room service appropriate?: Yes; Fluid consistency:: Thin  Consultants:  None  Procedures:  Debridement of Fournier's gangrene  Antibiotics:  Vancomycin and Zosyn   Objective: Filed Vitals:   09/05/15 1100 09/05/15 1200  BP: 143/90 145/86  Pulse: 101 103  Temp:    Resp: 11 15    Intake/Output Summary (Last 24 hours) at 09/05/15 1404 Last data filed at 09/05/15 0824  Gross per 24 hour  Intake 2328.33 ml  Output    545 ml  Net 1783.33 ml   Filed Weights   08/31/15 1615 08/31/15 2300 09/05/15 0100  Weight: 60.782 kg (134 lb) 55.8 kg (123 lb 0.3 oz) 64.9 kg (143 lb 1.3 oz)    Exam: General: Alert and awake, oriented x3, not in any acute distress. HEENT: anicteric sclera, pupils reactive to light and accommodation, EOMI CVS: S1-S2 clear, no murmur rubs or gallops Chest: clear to auscultation bilaterally, no wheezing, rales or rhonchi Abdomen: soft nontender, nondistended, normal  bowel sounds, no organomegaly Extremities: no cyanosis, clubbing or edema noted  bilaterally Neuro: Cranial nerves II-XII intact, no focal neurological deficits  Data Reviewed: Basic Metabolic Panel:  Recent Labs Lab 08/31/15 1617 08/31/15 1629 08/31/15 1958 09/01/15 0445 09/02/15 0953 09/03/15 0318 09/05/15 0318  NA 139 138 137 140 139 139 140  K 4.0 3.9 3.6 3.8 3.2* 3.7 3.1*  CL 105 101  --  107 105 107 105  CO2 27  --   --  25 26 26 26   GLUCOSE 155* 150*  --  98 91 123* 95  BUN 28* 26*  --  22* 17 17 16   CREATININE 0.91 0.90  --  0.60* 0.60* 0.38* 0.57*  CALCIUM 8.9  --   --  8.6* 8.5* 8.2* 8.5*   Liver Function Tests: No results for input(s): AST, ALT, ALKPHOS, BILITOT, PROT, ALBUMIN in the last 168 hours. No results for input(s): LIPASE, AMYLASE in the last 168 hours. No results for input(s): AMMONIA in the last 168 hours. CBC:  Recent Labs Lab 08/31/15 1617 08/31/15 1629 08/31/15 1958 09/01/15 0445 09/05/15 0318  WBC 10.6*  --   --  14.0* 14.8*  NEUTROABS 9.5*  --   --   --   --   HGB 9.7* 10.2* 7.5* 11.2* 11.2*  HCT 30.1* 30.0* 22.0* 33.0* 34.8*  MCV 98.4  --   --  90.7 95.1  PLT 367  --   --  311 379   Cardiac Enzymes:  Recent Labs Lab 09/04/15 0232 09/04/15 0752 09/04/15 1447  TROPONINI <0.03 <0.03 <0.03   BNP (last 3 results)  Recent Labs  08/15/15 1913  BNP 365.2*    ProBNP (last 3 results) No results for input(s): PROBNP in the last 8760 hours.  CBG: No results for input(s): GLUCAP in the last 168 hours.  Micro Recent Results (from the past 240 hour(s))  Tissue culture     Status: None   Collection Time: 08/31/15  6:55 PM  Result Value Ref Range Status   Specimen Description ABDOMEN TISSUE NECROTIC  Final   Special Requests NONE  Final   Gram Stain   Final    FEW WBC PRESENT,BOTH PMN AND MONONUCLEAR NO ORGANISMS SEEN Performed at [facility]    Culture   Final    [place] Performed at [facility]    Report Status 09/03/2015 FINAL  Final    Organism ID, Bacteria SERRATIA MARCESCENS  Final      Susceptibility   Serratia marcescens - MIC*    CEFAZOLIN >=64 RESISTANT Resistant     CEFEPIME <=1 SENSITIVE Sensitive     CEFTAZIDIME <=1 SENSITIVE Sensitive     CEFTRIAXONE <=1 SENSITIVE Sensitive     CIPROFLOXACIN <=0.25 SENSITIVE Sensitive     GENTAMICIN <=1 SENSITIVE Sensitive     TOBRAMYCIN 8 INTERMEDIATE Intermediate     TRIMETH/SULFA Value in next row Sensitive      <=20 SENSITIVE(NOTE)    * MODERATE SERRATIA MARCESCENS  MRSA PCR Screening     Status: None   Collection Time: 08/31/15 11:33 PM  Result Value Ref Range Status   MRSA by PCR NEGATIVE NEGATIVE Final    Comment:        The GeneXpert MRSA Assay (FDA approved for NASAL specimens only), is one component of a comprehensive MRSA colonization surveillance program. It is not intended to diagnose MRSA infection nor to guide or monitor treatment for MRSA infections.  Studies: Dg Chest Port 1 View  09/04/2015  CLINICAL DATA:  Hypoxia EXAM: PORTABLE CHEST 1 VIEW COMPARISON:  08/15/2015 FINDINGS: Cardiac shadow is stable. The lungs are well aerated bilaterally. Mild interstitial changes are noted. No focal confluent infiltrate is seen. No acute bony abnormality is noted. IMPRESSION: No acute abnormality seen. Electronically Signed   By: Inez Catalina M.D.   On: 09/04/2015 02:53    Scheduled Meds: . atorvastatin  80 mg Oral q1800  . cefTAZidime (FORTAZ)  IV  2 g Intravenous 3 times per day  . feeding supplement (PRO-STAT SUGAR FREE 64)  60 mL Oral BID  . fluconazole (DIFLUCAN) IV  400 mg Intravenous Q24H  . heparin  5,000 Units Subcutaneous 3 times per day  . latanoprost  1 drop Both Eyes QHS  . metronidazole  500 mg Intravenous Q8H  .  morphine injection  4 mg Intravenous Once  . prednisoLONE acetate  1 drop Right Eye QID  . predniSONE  10 mg Oral BID WC  . sodium chloride flush  3 mL Intravenous Q12H  . vancomycin  1,000 mg Intravenous Q12H   Continuous  Infusions: . sodium chloride 100 mL/hr at 09/04/15 1502       Time spent: 35 minutes    [facility] A  [facility] Pager [phone] If 7PM-7AM, please contact night-coverage at [email or web address], password [facility] 09/05/2015, 2:04 PM  LOS: 5 days

## 2015-09-05 NOTE — Consult Note (Addendum)
[facility]       Reason for Consult: Founier's gangrene    Referring Physician: Dr. Hartford Poli  Principal Problem:   Fournier's gangrene Active Problems:   Anemia, normocytic normochromic   Leukocytosis   Benign essential HTN   . atorvastatin  80 mg Oral q1800  . cefTAZidime (FORTAZ)  IV  2 g Intravenous 3 times per day  . feeding supplement (PRO-STAT SUGAR FREE 64)  60 mL Oral BID  . fluconazole (DIFLUCAN) IV  400 mg Intravenous Q24H  . heparin  5,000 Units Subcutaneous 3 times per day  . latanoprost  1 drop Both Eyes QHS  . metronidazole  500 mg Intravenous Q8H  .  morphine injection  4 mg Intravenous Once  . potassium chloride  60 mEq Oral Q6H  . prednisoLONE acetate  1 drop Right Eye QID  . predniSONE  10 mg Oral BID WC  . sodium chloride flush  3 mL Intravenous Q12H  . vancomycin  1,000 mg Intravenous Q12H    Recommendations: Ceftriaxone and flagyl  Continue fluconazole Will stop vancomycin with no positive MRSA and possible ototoxicity  Assessment: He has Fournier's gangrene which is typically anaerobic, GAS or MRSA.  The only growth though is aerobic Serratia and yeast.  I certainly will still cover those and anaerobes.   HIV hepatitis C negative Decreased hearing, ? ototoxicity  Antibiotics: Vancomycin, ceftazidime, fluconazole  HPI: Rodney [name] is a 65 y.o. male with prostate cancer, s/p radiation, PVD with history of endarterectbomy and recent scrotal erythema who had presented with sloughing of scrotal skin, rapidly spreading erythema and gas noted on CT. Taken emergently to OR and debrided.  Culture has grown C albicans and Serratia.  He has been afebrile and WBC initially with elevated neutrophils.  Complains of bilateral decreased hearing since coming to the hospital. CT independently reviewed and gas gangrene noted.   Review of Systems:  Constitutional: negative for fevers and chills Gastrointestinal: negative for nausea  and diarrhea All other systems reviewed and are negative   Past Medical History  Diagnosis Date  . Prostate cancer ([place]) 05/03/2009    Prostatectomy/Adenocrcinoma  . Heart murmur     history  . Cataract     left eye surgery/   . Radiation 07/02/11-08/22/11    Prostate fossa 68.4 gray 38 fractions  . Cellulitis of scrotum   . Hyponatremia   . Bullous pemphigoid   . Peripheral vascular disease ([place])   . AKI (acute kidney injury) ([place])   . Thrombocytopenia ([place])   . CAD (coronary artery disease)   . Severe malnutrition ([place])   . Alcohol dependence ([place])   . Benign essential HTN 09/01/2015    Social History  Substance Use Topics  . Smoking status: Former Smoker -- 2.00 packs/day for 36 years    Types: Cigarettes    Quit date: 12/08/2014  . Smokeless tobacco: Never Used  . Alcohol Use: 3.6 oz/week    6 Cans of beer per week     Comment: 6-7  cans drinks daily 12 oz    Family History  Problem Relation Age of Onset  . Uterine cancer Mother 10    going to [employer]  . Breast cancer Sister     Allergies  Allergen Reactions  . Other Rash    polyester and metals except titanium.    Physical Exam: Constitutional: in no apparent distress and alert  Filed Vitals:   09/05/15 1552 09/05/15 1600  BP:  119/69  Pulse:  107  Temp: 97.5 F (36.4 C)   Resp:  10   EYES: anicteric ENMT: no thrush Cardiovascular: Cor RRR Respiratory: CTA B; normal respiratory effort GI: Bowel sounds are normal, liver is not enlarged, spleen is not enlarged Musculoskeletal: no pedal edema noted Skin: negatives: no rash GU: wrapped  Lab Results  Component Value Date   WBC 14.8* 09/05/2015   HGB 11.2* 09/05/2015   HCT 34.8* 09/05/2015   MCV 95.1 09/05/2015   PLT 379 09/05/2015    Lab Results  Component Value Date   CREATININE 0.57* 09/05/2015   BUN 16 09/05/2015   NA 140 09/05/2015   K 3.1* 09/05/2015   CL 105 09/05/2015   CO2 26 09/05/2015    Lab Results  Component Value Date    ALT 27 08/17/2015   AST 14* 08/17/2015   ALKPHOS 66 08/17/2015     Microbiology: Recent Results (from the past 240 hour(s))  Tissue culture     Status: None   Collection Time: 08/31/15  6:55 PM  Result Value Ref Range Status   Specimen Description ABDOMEN TISSUE NECROTIC  Final   Special Requests NONE  Final   Gram Stain   Final    FEW WBC PRESENT,BOTH PMN AND MONONUCLEAR NO ORGANISMS SEEN Performed at [facility]    Culture   Final    MODERATE SERRATIA [place] Performed at [facility]    Report Status 09/03/2015 FINAL  Final   Organism ID, Bacteria SERRATIA MARCESCENS  Final      Susceptibility   Serratia marcescens - MIC*    CEFAZOLIN >=64 RESISTANT Resistant     CEFEPIME <=1 SENSITIVE Sensitive     CEFTAZIDIME <=1 SENSITIVE Sensitive     CEFTRIAXONE <=1 SENSITIVE Sensitive     CIPROFLOXACIN <=0.25 SENSITIVE Sensitive     GENTAMICIN <=1 SENSITIVE Sensitive     TOBRAMYCIN 8 INTERMEDIATE Intermediate     TRIMETH/SULFA Value in next row Sensitive      <=20 SENSITIVE(NOTE)    * MODERATE SERRATIA MARCESCENS  MRSA PCR Screening     Status: None   Collection Time: 08/31/15 11:33 PM  Result Value Ref Range Status   MRSA by PCR NEGATIVE NEGATIVE Final    Comment:        The GeneXpert MRSA Assay (FDA approved for NASAL specimens only), is one component of a comprehensive MRSA colonization surveillance program. It is not intended to diagnose MRSA infection nor to guide or monitor treatment for MRSA infections.     Scharlene Gloss, [facility] [place] [email or web address] [phone] pager  [phone] cell 09/05/2015, 4:47 PM

## 2015-09-05 NOTE — Progress Notes (Signed)
Dressing change per Urology yesterday. Unfortunately, Dr. Hassell Done was operating at the same time as Dr. Jeffie Pollock last night.   Will be available if needed.

## 2015-09-06 ENCOUNTER — Encounter (HOSPITAL_COMMUNITY): Payer: 59

## 2015-09-06 ENCOUNTER — Ambulatory Visit: Payer: 59 | Admitting: Vascular Surgery

## 2015-09-06 DIAGNOSIS — R000 Tachycardia, unspecified: Secondary | ICD-10-CM

## 2015-09-06 DIAGNOSIS — R197 Diarrhea, unspecified: Secondary | ICD-10-CM

## 2015-09-06 DIAGNOSIS — I10 Essential (primary) hypertension: Secondary | ICD-10-CM

## 2015-09-06 DIAGNOSIS — F1910 Other psychoactive substance abuse, uncomplicated: Secondary | ICD-10-CM

## 2015-09-06 LAB — MAGNESIUM: Magnesium: 1.4 mg/dL — ABNORMAL LOW (ref 1.7–2.4)

## 2015-09-06 MED ORDER — SACCHAROMYCES BOULARDII 250 MG PO CAPS
250.0000 mg | ORAL_CAPSULE | Freq: Two times a day (BID) | ORAL | Status: DC
  Administered 2015-09-06 – 2015-09-08 (×4): 250 mg via ORAL
  Filled 2015-09-06 (×4): qty 1

## 2015-09-06 MED ORDER — POTASSIUM CHLORIDE CRYS ER 20 MEQ PO TBCR
40.0000 meq | EXTENDED_RELEASE_TABLET | ORAL | Status: AC
  Administered 2015-09-06 (×2): 40 meq via ORAL
  Filled 2015-09-06 (×2): qty 2

## 2015-09-06 MED ORDER — MAGNESIUM SULFATE 2 GM/50ML IV SOLN
2.0000 g | Freq: Once | INTRAVENOUS | Status: AC
  Administered 2015-09-06: 2 g via INTRAVENOUS
  Filled 2015-09-06: qty 50

## 2015-09-06 MED ORDER — METOPROLOL SUCCINATE ER 25 MG PO TB24
25.0000 mg | ORAL_TABLET | Freq: Every day | ORAL | Status: DC
  Administered 2015-09-06 – 2015-09-08 (×3): 25 mg via ORAL
  Filled 2015-09-06 (×3): qty 1

## 2015-09-06 NOTE — Progress Notes (Signed)
2 Days Post-Op Subjective: Patient reports minimal pain. He has no new complaints  Objective: Vital signs in last 24 hours: Temp:  [97.5 F (36.4 C)-99 F (37.2 C)] 97.7 F (36.5 C) (04/05 0800) Pulse Rate:  [70-124] 78 (04/05 0600) Resp:  [10-23] 15 (04/05 0600) BP: (114-162)/(65-94) 162/74 mmHg (04/05 0600) SpO2:  [92 %-100 %] 95 % (04/05 0600)  Intake/Output from previous day: 04/04 0701 - 04/05 0700 In: 2150 [P.O.:120; I.V.:1210; IV Piggyback:700] Out: 425 [Urine:425] Intake/Output this shift:    Physical Exam:  Constitutional: Vital signs reviewed. WD WN in NAD   Eyes: PERRL, No scleral icterus.   Cardiovascular: RRR Pulmonary/Chest: Normal effort Abdominal: Soft. Large abdominal wound showing good granulation tissue. No evidence of ongoing necrotic process. Genitourinary: Significant granulation tissue. No necrosis or ongoing evidence of fasciitis. Extremities: No cyanosis or edema   Lab Results:  Recent Labs  09/05/15 0318  HGB 11.2*  HCT 34.8*   BMET  Recent Labs  09/05/15 0318  NA 140  K 3.1*  CL 105  CO2 26  GLUCOSE 95  BUN 16  CREATININE 0.57*  CALCIUM 8.5*   No results for input(s): LABPT, INR in the last 72 hours. No results for input(s): LABURIN in the last 72 hours. Results for orders placed or performed during the hospital encounter of 08/31/15  Tissue culture     Status: None   Collection Time: 08/31/15  6:55 PM  Result Value Ref Range Status   Specimen Description ABDOMEN TISSUE NECROTIC  Final   Special Requests NONE  Final   Gram Stain   Final    FEW WBC PRESENT,BOTH PMN AND MONONUCLEAR NO ORGANISMS SEEN Performed at [facility]    Culture   Final    [place] Performed at [facility]    Report Status 09/03/2015 FINAL  Final   Organism ID, Bacteria SERRATIA MARCESCENS  Final      Susceptibility   Serratia marcescens - MIC*    CEFAZOLIN >=64 RESISTANT Resistant      CEFEPIME <=1 SENSITIVE Sensitive     CEFTAZIDIME <=1 SENSITIVE Sensitive     CEFTRIAXONE <=1 SENSITIVE Sensitive     CIPROFLOXACIN <=0.25 SENSITIVE Sensitive     GENTAMICIN <=1 SENSITIVE Sensitive     TOBRAMYCIN 8 INTERMEDIATE Intermediate     TRIMETH/SULFA Value in next row Sensitive      <=20 SENSITIVE(NOTE)    * MODERATE SERRATIA MARCESCENS  MRSA PCR Screening     Status: None   Collection Time: 08/31/15 11:33 PM  Result Value Ref Range Status   MRSA by PCR NEGATIVE NEGATIVE Final    Comment:        The GeneXpert MRSA Assay (FDA approved for NASAL specimens only), is one component of a comprehensive MRSA colonization surveillance program. It is not intended to diagnose MRSA infection nor to guide or monitor treatment for MRSA infections.     Studies/Results: No results found.  Assessment/Plan:   Genital abdominal wound was change at the bedside without difficulty. We'll ask for wound care consultation. Will likely need plastics consult as well for consideration of reconstruction.  There'll be no ongoing need for operative dressing changes unless there is additional debridement that becomes required.   LOS: 6 days   Elian Gloster S 09/06/2015, 12:16 PM

## 2015-09-06 NOTE — Progress Notes (Signed)
Patient was found to be handling a e-cigarette he brought in from home. Patient educated on tobacco free policy. Patient was offered several times the option of Nicotene but patient  refused. Explained that e-cigarette must be locked up with security and patient agreed. Paperwork was filled out and valuables sent to security.

## 2015-09-06 NOTE — Progress Notes (Signed)
[facility] Progress Note   Rodney [name]  [id number]  DOB: 09-30-1950  DOA: 08/31/2015 PCP: Simona Huh, MD  Brief narrative: Rodney [name] is a 65 y.o. male  With h/o PAD with L femoral endartrectomy, CAD, patent stent on 08/04/15 cath, chronic skin disorder on steroids, HTN, HLD, prostate CA s/p prostatectomy and radiation.  Admitted 3/14 - 3/21 for scrotal cellulitis. Returned with Fornier's gangrene. Taken to OR on the day of admision.   Subjective: Poor appetite. States the food is bad and he thinks constipation was making his appeitie poor. Had a large blow out of stool today. Pain is controlled. No nausea, vomiting, cough.   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 -s/p debridement- went to OR 3/30, 4/1 and 4/4 - antibiotics per ID- add florastor  - cont foley cath   Active Problems:  HTN/ bradycardia - Toprol 25 mg BID at home- held due to bradycardia and 0.67 sec pause - now tachycardic- will resume at 25 mg daily and follow telemetry  Diarrhea - RN asking for rectal to tube to prevent soiling of wound - no laxatives given recently- check C diff as he is on multiple antibiotics  Poor PO intake - does not like the food- cont Prostat, regular diet - cont IVF until PO fluid intake and diarrhea improves     Anemia, normocytic normochromic with acute blood loss - given 2 U PRBC for Hb of 7.5 on 3/30- Hb since stable at 11   Hypokalemia/ hypomagnesemia - replacing  Smoker - had E cig out this AM- counseled not to smoke  HLD - statin  CAD with stent - ASA on hold - will discus with surgery when to resume  PVD s/p left femoral endartrectomy  Chronic skin condition - on Prednisone 10 mg BID at home and here  Antibiotics: Anti-infectives    Start     Dose/Rate Route Frequency Ordered Stop   09/05/15 2200  cefTRIAXone (ROCEPHIN) 2 g in dextrose 5 % 50 mL IVPB     2 g 100 mL/hr over 30  Minutes Intravenous Every 24 hours 09/05/15 1713     09/04/15 2200  cefTAZidime (FORTAZ) 2 g in dextrose 5 % 50 mL IVPB  Status:  Discontinued     2 g 100 mL/hr over 30 Minutes Intravenous 3 times per day 09/04/15 1434 09/05/15 1712   09/04/15 2000  vancomycin (VANCOCIN) IVPB 1000 mg/200 mL premix  Status:  Discontinued     1,000 mg 200 mL/hr over 60 Minutes Intravenous Every 12 hours 09/04/15 1951 09/05/15 1712   09/04/15 1600  metroNIDAZOLE (FLAGYL) IVPB 500 mg     500 mg 100 mL/hr over 60 Minutes Intravenous Every 8 hours 09/04/15 1437     09/04/15 1500  cefTAZidime (FORTAZ) 2 g in dextrose 5 % 50 mL IVPB     2 g 100 mL/hr over 30 Minutes Intravenous STAT 09/04/15 1434 09/04/15 1527   09/03/15 1800  fluconazole (DIFLUCAN) IVPB 400 mg     400 mg 100 mL/hr over 120 Minutes Intravenous Every 24 hours 09/02/15 1605     09/02/15 1700  fluconazole (DIFLUCAN) IVPB 800 mg     800 mg 100 mL/hr over 240 Minutes Intravenous  Once 09/02/15 1605 09/02/15 2108   09/01/15 0600  [MAR Hold]  vancomycin (VANCOCIN) IVPB 750 mg/150 ml premix  Status:  Discontinued     (MAR Hold since 09/04/15 1707)   750 mg 150 mL/hr over 60  Minutes Intravenous Every 12 hours 08/31/15 1708 09/04/15 1951   09/01/15 0230  piperacillin-tazobactam (ZOSYN) IVPB 3.375 g  Status:  Discontinued     3.375 g 12.5 mL/hr over 240 Minutes Intravenous 3 times per day 09/01/15 0228 09/04/15 1431   08/31/15 1715  piperacillin-tazobactam (ZOSYN) IVPB 3.375 g     3.375 g 12.5 mL/hr over 240 Minutes Intravenous  Once 08/31/15 1703 08/31/15 1915   08/31/15 1630  vancomycin (VANCOCIN) IVPB 1000 mg/200 mL premix     1,000 mg 200 mL/hr over 60 Minutes Intravenous STAT 08/31/15 1621 08/31/15 1730     Code Status:     Code Status Orders        Start     Ordered   09/01/15 0108  Full code   Continuous     09/01/15 0115    Code Status History    Date Active Date Inactive Code Status Order ID Comments User Context   08/15/2015 11:40  PM 08/22/2015 10:54 PM Full Code [id number]  Edwin Dada, MD Inpatient   08/04/2015  1:08 AM 08/04/2015  3:15 PM Full Code [id number]  Charolette Forward, MD ED   02/17/2015  4:35 PM 02/18/2015  5:10 PM Full Code [id number]  Ulyses Amor, PA-C Inpatient   02/16/2015  5:42 PM 02/17/2015  4:25 PM Full Code [id number]  Ulyses Amor, PA-C Inpatient   02/09/2015  7:55 PM 02/10/2015  8:42 PM Full Code [id number]  Angelia Mould, MD Inpatient     Family Communication:   Disposition Plan: follow in SDU DVT prophylaxis: Heaprin Consultants: Urology, gen surgery, ID Procedures: Debridement of Fournier's gangrene    Objective: Filed Weights   08/31/15 1615 08/31/15 2300 09/05/15 0100  Weight: 60.782 kg (134 lb) 55.8 kg (123 lb 0.3 oz) 64.9 kg (143 lb 1.3 oz)    Intake/Output Summary (Last 24 hours) at 09/06/15 1031 Last data filed at 09/06/15 0600  Gross per 24 hour  Intake   1950 ml  Output    425 ml  Net   1525 ml     Vitals Filed Vitals:   09/06/15 0400 09/06/15 0500 09/06/15 0600 09/06/15 0800  BP: 161/78 151/72 162/74   Pulse: 89 70 78   Temp: 98.4 F (36.9 C)   97.7 F (36.5 C)  TempSrc: Oral     Resp: 13 16 15    Height:      Weight:      SpO2: 96% 97% 95%     Exam:  General:  Pt is alert, not in acute distress  HEENT: No icterus, No thrush, oral mucosa moist  Cardiovascular: regular rate and rhythm, S1/S2 No murmur- tachycardic in low 100s  Respiratory: clear to auscultation bilaterally   Abdomen: Soft, +Bowel sounds, non tender, non distended, no guarding  MSK: No cyanosis or clubbing- no pedal edema  Skin: extensive debridement of skin of lower abdomen and scrotum with exposed testicle   Data Reviewed: Basic Metabolic Panel:  Recent Labs Lab 08/31/15 1617 08/31/15 1629 08/31/15 1958 09/01/15 0445 09/02/15 0953 09/03/15 0318 09/05/15 0318  NA 139 138 137 140 139 139 140  K 4.0 3.9 3.6 3.8 3.2* 3.7 3.1*  CL 105 101  --  107 105 107 105  CO2 27  --    --  25 26 26 26   GLUCOSE 155* 150*  --  98 91 123* 95  BUN 28* 26*  --  22* 17 17 16   CREATININE 0.91 0.90  --  0.60*  0.60* 0.38* 0.57*  CALCIUM 8.9  --   --  8.6* 8.5* 8.2* 8.5*   Liver Function Tests: No results for input(s): AST, ALT, ALKPHOS, BILITOT, PROT, ALBUMIN in the last 168 hours. No results for input(s): LIPASE, AMYLASE in the last 168 hours. No results for input(s): AMMONIA in the last 168 hours. CBC:  Recent Labs Lab 08/31/15 1617 08/31/15 1629 08/31/15 1958 09/01/15 0445 09/05/15 0318  WBC 10.6*  --   --  14.0* 14.8*  NEUTROABS 9.5*  --   --   --   --   HGB 9.7* 10.2* 7.5* 11.2* 11.2*  HCT 30.1* 30.0* 22.0* 33.0* 34.8*  MCV 98.4  --   --  90.7 95.1  PLT 367  --   --  311 379   Cardiac Enzymes:  Recent Labs Lab 09/04/15 0232 09/04/15 0752 09/04/15 1447  TROPONINI <0.03 <0.03 <0.03   BNP (last 3 results)  Recent Labs  08/15/15 1913  BNP 365.2*    ProBNP (last 3 results) No results for input(s): PROBNP in the last 8760 hours.  CBG: No results for input(s): GLUCAP in the last 168 hours.  Recent Results (from the past 240 hour(s))  Tissue culture     Status: None   Collection Time: 08/31/15  6:55 PM  Result Value Ref Range Status   Specimen Description ABDOMEN TISSUE NECROTIC  Final   Special Requests NONE  Final   Gram Stain   Final    FEW WBC PRESENT,BOTH PMN AND MONONUCLEAR NO ORGANISMS SEEN Performed at [facility]    Culture   Final    [place] Performed at [facility]    Report Status 09/03/2015 FINAL  Final   Organism ID, Bacteria SERRATIA MARCESCENS  Final      Susceptibility   Serratia marcescens - MIC*    CEFAZOLIN >=64 RESISTANT Resistant     CEFEPIME <=1 SENSITIVE Sensitive     CEFTAZIDIME <=1 SENSITIVE Sensitive     CEFTRIAXONE <=1 SENSITIVE Sensitive     CIPROFLOXACIN <=0.25 SENSITIVE Sensitive     GENTAMICIN <=1 SENSITIVE Sensitive     TOBRAMYCIN 8  INTERMEDIATE Intermediate     TRIMETH/SULFA Value in next row Sensitive      <=20 SENSITIVE(NOTE)    * MODERATE SERRATIA MARCESCENS  MRSA PCR Screening     Status: None   Collection Time: 08/31/15 11:33 PM  Result Value Ref Range Status   MRSA by PCR NEGATIVE NEGATIVE Final    Comment:        The GeneXpert MRSA Assay (FDA approved for NASAL specimens only), is one component of a comprehensive MRSA colonization surveillance program. It is not intended to diagnose MRSA infection nor to guide or monitor treatment for MRSA infections.      Studies: No results found.  Scheduled Meds:  Scheduled Meds: . atorvastatin  80 mg Oral q1800  . cefTRIAXone (ROCEPHIN)  IV  2 g Intravenous Q24H  . feeding supplement (PRO-STAT SUGAR FREE 64)  60 mL Oral BID  . fluconazole (DIFLUCAN) IV  400 mg Intravenous Q24H  . heparin  5,000 Units Subcutaneous 3 times per day  . latanoprost  1 drop Both Eyes QHS  . metoprolol succinate  25 mg Oral Daily  . metronidazole  500 mg Intravenous Q8H  .  morphine injection  4 mg Intravenous Once  . potassium chloride  40 mEq Oral Q4H  . prednisoLONE acetate  1 drop Right Eye QID  .  predniSONE  10 mg Oral BID WC  . sodium chloride flush  3 mL Intravenous Q12H   Continuous Infusions: . sodium chloride 100 mL/hr at 09/06/15 0036    Time spent on care of this patient: 86 min   [place], MD 09/06/2015, 10:31 AM  LOS: 6 days   [facility] Office  [phone] Pager - Text Page per [email or web address] If 7PM-7AM, please contact night-coverage [email or web address]

## 2015-09-06 NOTE — Progress Notes (Signed)
Date:  September 06, 2015 Chart reviewed for concurrent status and case management needs. Will continue to follow patient for changes and needs: large complicated wound care due to placement of dressings and anatomy with frequent soiling of the area. Velva Harman, BSN, [place], [place]   [phone]

## 2015-09-06 NOTE — Progress Notes (Signed)
    Rodney [name] for Infectious Disease   Reason for visit: Follow up on Fournier's gangrene  Interval History: some increased stool output and C diff ordered.  WBC 14.   Physical Exam: Constitutional:  Filed Vitals:   09/06/15 0800 09/06/15 1217  BP:    Pulse:    Temp: 97.7 F (36.5 C) 97.3 F (36.3 C)  Resp:     patient appears in NAD  Impression: Stable Fournier's.  On antibiotics including anaerobic coverage.  Stopped vancomycin with no MRSA and concern for ototoxicity.    Plan: 1.  No changes, continue with ceftriaxone and flagyl.   2.  Diarrhea - C diff pending

## 2015-09-06 NOTE — Op Note (Signed)
NAME:  Rodney [name], Rodney [name]                ACCOUNT NO.:  [id number]  MEDICAL RECORD NO.:  [id number]  LOCATION:                                 FACILITY:  PHYSICIAN:  Marshall Cork. Jeffie Pollock, M.D.    DATE OF BIRTH:  Sep 01, 1950  DATE OF PROCEDURE:  09/04/2015 DATE OF DISCHARGE:                              OPERATIVE REPORT   PROCEDURE: 1. Debridement of complex wound. 2. Dressing change of complex wound.  PREOPERATIVE DIAGNOSIS:  Fournier gangrene with extensive prior debridement.  POSTOPERATIVE DIAGNOSIS:  Fournier gangrene with extensive prior debridement with small amount of residual necrotic tissue.  SURGEON:  Marshall Cork. Jeffie Pollock, MD  ANESTHESIA:  General.  SPECIMEN:  None.  DRAINS:  Pre-existing Foley catheter.  BLOOD LOSS:  None.  COMPLICATIONS:  None.  INDICATIONS:  [name] is a 65 year old white male with a history of Fournier gangrene and underwent an extensive debridement by Urology and General Surgery several days ago, he returns now for a dressing change under anesthesia with additional debridement as needed.  FINDINGS OF PROCEDURE:  He had been on Zosyn.  He was taken to the operating room where general anesthetic was induced.  He was placed in lithotomy position.  His prior dressings were removed.  He had a Foley catheter that was pre-existing that remained in place.  On inspection of the wound, there was a small area of necrosis in the left lateral margin and also a small area of necrotic muscle in the left suprapubic area.  There was some ecchymotic tissue around the resection margin at the penis, but there was no evidence of further Fournier gangrene beyond the margins of the wound.  The remainder of the wound was very clean with early granulation tissue.  A pickup and scissors were used to trim away the small amount of residual necrotic tissue noted on initial comments.  Once this had been done, a fresh saline Kerlix were used to redress the entire wound.   The testicles and penis had previously been wrapped, in this case I draped the material over them and tucked it behind, so they could be more easily changed on the floor without having to be manipulated quite as extensively.  A total of 3 saline- soaked Kerlix were used followed by 4 ABD pads and new mesh panties. Once the dressing was in place, the patient was taken down from lithotomy position and anesthetic was reversed.  He was moved to recovery room in stable condition.  There were no complications.     Marshall Cork. Jeffie Pollock, M.D.   ______________________________ Marshall Cork. Jeffie Pollock, M.D.    JJW/MEDQ  D:  09/04/2015  T:  09/05/2015  Job:  [id number]

## 2015-09-07 DIAGNOSIS — D649 Anemia, unspecified: Secondary | ICD-10-CM

## 2015-09-07 LAB — C DIFFICILE QUICK SCREEN W PCR REFLEX
C DIFFICILE (CDIFF) INTERP: NEGATIVE
C DIFFICILE (CDIFF) TOXIN: NEGATIVE
C DIFFICLE (CDIFF) ANTIGEN: NEGATIVE

## 2015-09-07 LAB — CBC
HEMATOCRIT: 29.8 % — AB (ref 39.0–52.0)
Hemoglobin: 9.5 g/dL — ABNORMAL LOW (ref 13.0–17.0)
MCH: 30.4 pg (ref 26.0–34.0)
MCHC: 31.9 g/dL (ref 30.0–36.0)
MCV: 95.5 fL (ref 78.0–100.0)
PLATELETS: 380 10*3/uL (ref 150–400)
RBC: 3.12 MIL/uL — AB (ref 4.22–5.81)
RDW: 15.9 % — AB (ref 11.5–15.5)
WBC: 12.4 10*3/uL — AB (ref 4.0–10.5)

## 2015-09-07 LAB — BASIC METABOLIC PANEL
Anion gap: 6 (ref 5–15)
BUN: 17 mg/dL (ref 6–20)
CALCIUM: 8.2 mg/dL — AB (ref 8.9–10.3)
CO2: 29 mmol/L (ref 22–32)
CREATININE: 0.53 mg/dL — AB (ref 0.61–1.24)
Chloride: 106 mmol/L (ref 101–111)
GFR calc non Af Amer: >60 mL/min (ref >60)
Glucose, Bld: 127 mg/dL — ABNORMAL HIGH (ref 65–99)
Potassium: 4.1 mmol/L (ref 3.5–5.1)
Sodium: 141 mmol/L (ref 135–145)

## 2015-09-07 LAB — MAGNESIUM: MAGNESIUM: 1.9 mg/dL (ref 1.7–2.4)

## 2015-09-07 MED ORDER — FLUCONAZOLE 100 MG PO TABS
200.0000 mg | ORAL_TABLET | Freq: Every day | ORAL | Status: DC
  Administered 2015-09-08: 200 mg via ORAL
  Filled 2015-09-07: qty 2

## 2015-09-07 MED ORDER — DIPHENOXYLATE-ATROPINE 2.5-0.025 MG PO TABS
2.0000 | ORAL_TABLET | Freq: Once | ORAL | Status: AC
  Administered 2015-09-07: 2 via ORAL
  Filled 2015-09-07: qty 2

## 2015-09-07 MED ORDER — SULFAMETHOXAZOLE-TRIMETHOPRIM 800-160 MG PO TABS
1.0000 | ORAL_TABLET | Freq: Two times a day (BID) | ORAL | Status: DC
  Administered 2015-09-07 – 2015-09-08 (×2): 1 via ORAL
  Filled 2015-09-07 (×2): qty 1

## 2015-09-07 MED ORDER — MORPHINE SULFATE (PF) 2 MG/ML IV SOLN: 2.0000 mg | Freq: Once | INTRAVENOUS | Status: DC

## 2015-09-07 MED ORDER — AMOXICILLIN-POT CLAVULANATE 875-125 MG PO TABS
1.0000 | ORAL_TABLET | Freq: Two times a day (BID) | ORAL | Status: DC
  Administered 2015-09-07 – 2015-09-08 (×2): 1 via ORAL
  Filled 2015-09-07 (×2): qty 1

## 2015-09-07 NOTE — Consult Note (Signed)
WOC wound consult note Reason for Consult: Fournier's gangrene to abdomen, perineum, penis and scrotum Morphine 4 mg IV administered per bedside nurse.  Wound type:Infectious.  S/P surgical debridement.  Buttocks with fungal overgrowth.  Barrier cream to denuded skin Pressure Ulcer POA: N/A Measurement:Abdomen 33x 36 x 0.3 cm pink and moist with muscle visible.  Both scrotum and penis have been debrided and are pink and moist Debridement extends through perineum, beneath scrotum.   Flexiseal is replaced.  Wound [id number] and moist Drainage (amount, consistency, odor) Minimal serosanguinous  No odor Periwound:Intact Dressing procedure/placement/frequency: Cleanse wounds to abdomen, scrotum and penis, extending through perineum with NS.  Gently fill defect with NS moist kerlix.  Cover with ABD pads and tape.  Mesh underwear to minimize tape usage. Change daily.   Bedside nurse may perform.  [place] team will follow and remain available to patient, medical and nursing team.   Domenic Moras RN BSN [facility] Pager [phone]

## 2015-09-07 NOTE — Progress Notes (Signed)
Pharmacy Antibiotic Note  Rodney [name] is a 65 y.o. male admitted on 08/31/2015 with necrotizing wound infection. Currently on Day 8 of anti-infectives for Fournier's gangrene.   D3 Rocephin 2g IV q24h per ID D4 Flagyl 500mg  IV q8h per [facility] D6 Fluconazole 400mg  IV q24h (following 800mg  loading dose 4/1) per Rx protocol  Plan:  Continue Fluconazole 400 mg IV q24h.    Since SCr has been stable, no further dosing adjustments anticipated. Pharmacy will sign-off  Will defer transition to PO dosing to MD  Height: 5\' 8"  (172.7 cm) Weight: 143 lb 1.3 oz (64.9 kg) IBW/kg (Calculated) : 68.4  Temp (24hrs), Avg:98 F (36.7 C), Min:97.3 F (36.3 C), Max:99 F (37.2 C)   Recent Labs Lab 08/31/15 1617  08/31/15 1630 09/01/15 0445 09/02/15 0953 09/03/15 0318 09/04/15 1903 09/05/15 0318 09/07/15 0308  WBC 10.6*  --   --  14.0*  --   --   --  14.8* 12.4*  CREATININE 0.91  < >  --  0.60* 0.60* 0.38*  --  0.57* 0.53*  LATICACIDVEN  --   --  1.31  --   --   --   --   --   --   VANCOTROUGH  --   --   --   --   --   --  9*  --   --   < > = values in this interval not displayed.  Estimated Creatinine Clearance: 86.8 mL/min (by C-G formula based on Cr of 0.53).    Allergies  Allergen Reactions  . Other Rash    polyester and metals except titanium.    Antimicrobials this admission: 3/30 Vancomycin >> 4/4 3/30 Zosyn >> 4/3  4/1 Fluconazole >> 4/3 Ceftazidime >> 4/4 4/3 Flagyl >> 4/4 Rocephin >>  Microbiology results: 3/30 tissue cx: mod C albicans, mod Serratia (sens CTX, Cefepime, Ceftaz, Cipro, Gent, Bactrim) 3/30 MRSA PCR: neg 4/5 C.diff: ordered  Thank you for allowing pharmacy to be a part of this patient's care.  Peggyann Juba, PharmD, BCPS Pager: [phone] 09/04/2015 2:45 PM

## 2015-09-07 NOTE — Progress Notes (Signed)
[facility] Progress Note   [name]  [id number]  DOB: 1950-08-29  DOA: 08/31/2015 PCP: Simona Huh, MD  Brief narrative: Rodney [name] is a 65 y.o. male  With h/o PAD with L femoral endartrectomy, CAD, patent stent on 08/04/15 cath, chronic skin disorder on steroids, HTN, HLD, prostate CA s/p prostatectomy and radiation.  Admitted 3/14 - 3/21 for scrotal cellulitis. Returned with Fornier's gangrene. Taken to OR on the day of admision.   Subjective: Appetite still poor. He is not sure if he has had any more diarrhea. discussed with RN who states that flexiseal was put in and drained liquid stool - c diff not sent yesterday but sent today. Pain is controlled. No nausea, vomiting, cough.   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 -s/p debridement- went to OR 3/30, 4/1 and 4/4 - antibiotics per ID- added florastor  - cont foley cath   Active Problems:  HTN/ bradycardia - Toprol 25 mg BID at home- held due to bradycardia and 0.67 sec pause -  will resumed at 25 mg daily for tachycardia- follow telemetry  Diarrhea - C diff negative- will give 2 tabs of Lomotil  Poor PO intake - does not like the food- cont Prostat, regular diet- supplement drinks    Anemia, normocytic normochromic with acute blood loss - given 2 U PRBC for Hb of 7.5 on 3/30- following Hb closely- noted drop today- no blood loss in past 24 hrs   Hypokalemia/ hypomagnesemia - replaced  Smoker - had E cig out on 4/5 - counseled not to smoke  HLD - statin  CAD with stent - ASA on hold - will discus with surgery when to resume  PVD s/p left femoral endartrectomy  Chronic skin condition - on Prednisone 10 mg BID at home and here  Antibiotics: Anti-infectives    Start     Dose/Rate Route Frequency Ordered Stop   09/05/15 2200  cefTRIAXone (ROCEPHIN) 2 g in dextrose 5 % 50 mL IVPB     2 g 100 mL/hr over 30 Minutes Intravenous  Every 24 hours 09/05/15 1713     09/04/15 2200  cefTAZidime (FORTAZ) 2 g in dextrose 5 % 50 mL IVPB  Status:  Discontinued     2 g 100 mL/hr over 30 Minutes Intravenous 3 times per day 09/04/15 1434 09/05/15 1712   09/04/15 2000  vancomycin (VANCOCIN) IVPB 1000 mg/200 mL premix  Status:  Discontinued     1,000 mg 200 mL/hr over 60 Minutes Intravenous Every 12 hours 09/04/15 1951 09/05/15 1712   09/04/15 1600  metroNIDAZOLE (FLAGYL) IVPB 500 mg     500 mg 100 mL/hr over 60 Minutes Intravenous Every 8 hours 09/04/15 1437     09/04/15 1500  cefTAZidime (FORTAZ) 2 g in dextrose 5 % 50 mL IVPB     2 g 100 mL/hr over 30 Minutes Intravenous STAT 09/04/15 1434 09/04/15 1527   09/03/15 1800  fluconazole (DIFLUCAN) IVPB 400 mg     400 mg 100 mL/hr over 120 Minutes Intravenous Every 24 hours 09/02/15 1605     09/02/15 1700  fluconazole (DIFLUCAN) IVPB 800 mg     800 mg 100 mL/hr over 240 Minutes Intravenous  Once 09/02/15 1605 09/02/15 2108   09/01/15 0600  [MAR Hold]  vancomycin (VANCOCIN) IVPB 750 mg/150 ml premix  Status:  Discontinued     (MAR Hold since 09/04/15 1707)   750 mg 150 mL/hr over 60 Minutes Intravenous Every  12 hours 08/31/15 1708 09/04/15 1951   09/01/15 0230  piperacillin-tazobactam (ZOSYN) IVPB 3.375 g  Status:  Discontinued     3.375 g 12.5 mL/hr over 240 Minutes Intravenous 3 times per day 09/01/15 0228 09/04/15 1431   08/31/15 1715  piperacillin-tazobactam (ZOSYN) IVPB 3.375 g     3.375 g 12.5 mL/hr over 240 Minutes Intravenous  Once 08/31/15 1703 08/31/15 1915   08/31/15 1630  vancomycin (VANCOCIN) IVPB 1000 mg/200 mL premix     1,000 mg 200 mL/hr over 60 Minutes Intravenous STAT 08/31/15 1621 08/31/15 1730     Code Status:     Code Status Orders        Start     Ordered   09/01/15 0108  Full code   Continuous     09/01/15 0115    Code Status History    Date Active Date Inactive Code Status Order ID Comments User Context   08/15/2015 11:40 PM 08/22/2015 10:54  PM Full Code [id number]  Edwin Dada, MD Inpatient   08/04/2015  1:08 AM 08/04/2015  3:15 PM Full Code [id number]  Charolette Forward, MD ED   02/17/2015  4:35 PM 02/18/2015  5:10 PM Full Code [id number]  Ulyses Amor, PA-C Inpatient   02/16/2015  5:42 PM 02/17/2015  4:25 PM Full Code [id number]  Ulyses Amor, PA-C Inpatient   02/09/2015  7:55 PM 02/10/2015  8:42 PM Full Code [id number]  Angelia Mould, MD Inpatient     Family Communication:   Disposition Plan: follow in SDU DVT prophylaxis: Heaprin Consultants: Urology, gen surgery, ID Procedures: Debridement of Fournier's gangrene    Objective: Filed Weights   08/31/15 1615 08/31/15 2300 09/05/15 0100  Weight: 60.782 kg (134 lb) 55.8 kg (123 lb 0.3 oz) 64.9 kg (143 lb 1.3 oz)    Intake/Output Summary (Last 24 hours) at 09/07/15 1509 Last data filed at 09/07/15 0700  Gross per 24 hour  Intake   2390 ml  Output    900 ml  Net   1490 ml     Vitals Filed Vitals:   09/07/15 0400 09/07/15 0600 09/07/15 0800 09/07/15 1200  BP:  126/83    Pulse:  91    Temp: 98.7 F (37.1 C)  97.6 F (36.4 C) 97.6 F (36.4 C)  TempSrc: Oral  Oral Oral  Resp:  22    Height:      Weight:      SpO2:  90%      Exam:  General:  Pt is alert, not in acute distress  HEENT: No icterus, No thrush, oral mucosa moist  Cardiovascular: regular rate and rhythm, S1/S2 No murmur-    Respiratory: clear to auscultation bilaterally   Abdomen: Soft, +Bowel sounds, non tender, non distended, no guarding  MSK: No cyanosis or clubbing- no pedal edema  Skin: extensive debridement of skin of lower abdomen and scrotum with exposed testicle   Data Reviewed: Basic Metabolic Panel:  Recent Labs Lab 09/01/15 0445 09/02/15 0953 09/03/15 0318 09/05/15 0318 09/06/15 1034 09/07/15 0308  NA 140 139 139 140  --  141  K 3.8 3.2* 3.7 3.1*  --  4.1  CL 107 105 107 105  --  106  CO2 25 26 26 26   --  29  GLUCOSE 98 91 123* 95  --  127*  BUN 22* 17 17 16    --  17  CREATININE 0.60* 0.60* 0.38* 0.57*  --  0.53*  CALCIUM 8.6* 8.5* 8.2* 8.5*  --  8.2*  MG  --   --   --   --  1.4* 1.9   Liver Function Tests: No results for input(s): AST, ALT, ALKPHOS, BILITOT, PROT, ALBUMIN in the last 168 hours. No results for input(s): LIPASE, AMYLASE in the last 168 hours. No results for input(s): AMMONIA in the last 168 hours. CBC:  Recent Labs Lab 08/31/15 1617 08/31/15 1629 08/31/15 1958 09/01/15 0445 09/05/15 0318 09/07/15 0308  WBC 10.6*  --   --  14.0* 14.8* 12.4*  NEUTROABS 9.5*  --   --   --   --   --   HGB 9.7* 10.2* 7.5* 11.2* 11.2* 9.5*  HCT 30.1* 30.0* 22.0* 33.0* 34.8* 29.8*  MCV 98.4  --   --  90.7 95.1 95.5  PLT 367  --   --  311 379 380   Cardiac Enzymes:  Recent Labs Lab 09/04/15 0232 09/04/15 0752 09/04/15 1447  TROPONINI <0.03 <0.03 <0.03   BNP (last 3 results)  Recent Labs  08/15/15 1913  BNP 365.2*    ProBNP (last 3 results) No results for input(s): PROBNP in the last 8760 hours.  CBG: No results for input(s): GLUCAP in the last 168 hours.  Recent Results (from the past 240 hour(s))  Tissue culture     Status: None   Collection Time: 08/31/15  6:55 PM  Result Value Ref Range Status   Specimen Description ABDOMEN TISSUE NECROTIC  Final   Special Requests NONE  Final   Gram Stain   Final    FEW WBC PRESENT,BOTH PMN AND MONONUCLEAR NO ORGANISMS SEEN Performed at [facility]    Culture   Final    [place] Performed at [facility]    Report Status 09/03/2015 FINAL  Final   Organism ID, Bacteria SERRATIA MARCESCENS  Final      Susceptibility   Serratia marcescens - MIC*    CEFAZOLIN >=64 RESISTANT Resistant     CEFEPIME <=1 SENSITIVE Sensitive     CEFTAZIDIME <=1 SENSITIVE Sensitive     CEFTRIAXONE <=1 SENSITIVE Sensitive     CIPROFLOXACIN <=0.25 SENSITIVE Sensitive     GENTAMICIN <=1 SENSITIVE Sensitive     TOBRAMYCIN 8 INTERMEDIATE  Intermediate     TRIMETH/SULFA Value in next row Sensitive      <=20 SENSITIVE(NOTE)    * MODERATE SERRATIA MARCESCENS  MRSA PCR Screening     Status: None   Collection Time: 08/31/15 11:33 PM  Result Value Ref Range Status   MRSA by PCR NEGATIVE NEGATIVE Final    Comment:        The GeneXpert MRSA Assay (FDA approved for NASAL specimens only), is one component of a comprehensive MRSA colonization surveillance program. It is not intended to diagnose MRSA infection nor to guide or monitor treatment for MRSA infections.   C difficile quick scan w PCR reflex     Status: None   Collection Time: 09/07/15 12:29 PM  Result Value Ref Range Status   C Diff antigen NEGATIVE NEGATIVE Final   C Diff toxin NEGATIVE NEGATIVE Final   C Diff interpretation Negative for toxigenic C. difficile  Final     Studies: No results found.  Scheduled Meds:  Scheduled Meds: . atorvastatin  80 mg Oral q1800  . cefTRIAXone (ROCEPHIN)  IV  2 g Intravenous Q24H  . feeding supplement (PRO-STAT SUGAR FREE 64)  60 mL Oral BID  . fluconazole (DIFLUCAN) IV  400 mg Intravenous Q24H  .  heparin  5,000 Units Subcutaneous 3 times per day  . latanoprost  1 drop Both Eyes QHS  . metoprolol succinate  25 mg Oral Daily  . metronidazole  500 mg Intravenous Q8H  .  morphine injection  2 mg Intravenous Once  .  morphine injection  4 mg Intravenous Once  . prednisoLONE acetate  1 drop Right Eye QID  . predniSONE  10 mg Oral BID WC  . saccharomyces boulardii  250 mg Oral BID  . sodium chloride flush  3 mL Intravenous Q12H   Continuous Infusions:    Time spent on care of this patient: 40 min   [place], MD 09/07/2015, 3:09 PM  LOS: 7 days   [facility] Office  [phone] Pager - Text Page per [email or web address] If 7PM-7AM, please contact night-coverage [email or web address]

## 2015-09-07 NOTE — Clinical Social Work Note (Signed)
Clinical Social Work Assessment  Patient Details  Name: Rodney [name] MRN: [id number] Date of Birth: 09/13/1950  Date of referral:  08/31/15               Reason for consult:  Facility Placement, Discharge Planning                Permission sought to share information with:    Permission granted to share information::     Name::        Agency::     Relationship::     Contact Information:     Housing/Transportation Living arrangements for the past 2 months:  Skilled Nursing Facility, Single Family Home Source of Information:  Patient Patient Interpreter Needed:  None Criminal Activity/Legal Involvement Pertinent to Current Situation/Hospitalization:  No - Comment as needed Significant Relationships:  Siblings Lives with:  Facility Resident Do you feel safe going back to the place where you live?   (Pt isn't sure about disposition.) Need for family participation in patient care:  No (Coment)  Care giving concerns:  Disposition has not been determined.   Social Worker assessment / plan:  Pt hospitalized on 08/31/15 with Fournier's Gangrene. Pt had been hospitalized, at [facility], from 3/14-3/21 and d/c to [facility] for ST Rehab. CSW met with pt to assist with d/c planning. Pt reports that he isn't sure what his plans are following hospital d/c. Pt requested that CSW return " a little closer to the d/c " to work on d/c planning. Pt remains in ICU. CSW will continue to follow to assist with d/c planning.  Employment status:  Retired Insurance information:  Managed Medicare PT Recommendations:  Not assessed at this time Information / Referral to community resources:     Patient/Family's Response to care:  Disposition to be determined.  Patient/Family's Understanding of and Emotional Response to Diagnosis, Current Treatment, and Prognosis:  Pt is aware of his medical status. Pt  reports, " I don't know if I'll go back to rehab or go home from here. It all depends on how I'm doing. Please  come back later. " Support / reassurance provided.  Emotional Assessment Appearance:  Appears stated age Attitude/Demeanor/Rapport:  Other (cooperative) Affect (typically observed):  Calm, Appropriate, Pleasant Orientation:  Oriented to Self, Oriented to Place, Oriented to  Time, Oriented to Situation Alcohol / Substance use:  Not Applicable Psych involvement (Current and /or in the community):  No (Comment)  Discharge Needs  Concerns to be addressed:  Discharge Planning Concerns Readmission within the last 30 days:  Yes Current discharge risk:  None Barriers to Discharge:  No Barriers Identified   Haidinger, Jamie Lee, LCSW  [phone] 09/07/2015, 1:53 PM  

## 2015-09-07 NOTE — Progress Notes (Signed)
[facility] for Infectious Disease   Reason for visit: Follow up on Fournier's gangrene  Interval History: clean tissue, no fever, going to [facility] tomorrow  Physical Exam: Constitutional:  Filed Vitals:   09/07/15 0800 09/07/15 1200  BP:    Pulse:    Temp: 97.6 F (36.4 C) 97.6 F (36.4 C)  Resp:     patient appears in NAD Respiratory: Normal respiratory effort; CTA B Cardiovascular: RRR GU: wrapped  Review of Systems: Constitutional: negative for fevers and chills Gastrointestinal: negative for nausea and diarrhea Musculoskeletal: negative for myalgias and arthralgias  Lab Results  Component Value Date   WBC 12.4* 09/07/2015   HGB 9.5* 09/07/2015   HCT 29.8* 09/07/2015   MCV 95.5 09/07/2015   PLT 380 09/07/2015    Lab Results  Component Value Date   CREATININE 0.53* 09/07/2015   BUN 17 09/07/2015   NA 141 09/07/2015   K 4.1 09/07/2015   CL 106 09/07/2015   CO2 29 09/07/2015    Lab Results  Component Value Date   ALT 27 08/17/2015   AST 14* 08/17/2015   ALKPHOS 66 08/17/2015     Microbiology: Recent Results (from the past 240 hour(s))  Tissue culture     Status: None   Collection Time: 08/31/15  6:55 PM  Result Value Ref Range Status   Specimen Description ABDOMEN TISSUE NECROTIC  Final   Special Requests NONE  Final   Gram Stain   Final    FEW WBC PRESENT,BOTH PMN AND MONONUCLEAR NO ORGANISMS SEEN Performed at [facility]    Culture   Final    MODERATE SERRATIA [place] Performed at [facility]    Report Status 09/03/2015 FINAL  Final   Organism ID, Bacteria SERRATIA MARCESCENS  Final      Susceptibility   Serratia marcescens - MIC*    CEFAZOLIN >=64 RESISTANT Resistant     CEFEPIME <=1 SENSITIVE Sensitive     CEFTAZIDIME <=1 SENSITIVE Sensitive     CEFTRIAXONE <=1 SENSITIVE Sensitive     CIPROFLOXACIN <=0.25 SENSITIVE Sensitive     GENTAMICIN <=1 SENSITIVE Sensitive     TOBRAMYCIN 8  INTERMEDIATE Intermediate     TRIMETH/SULFA Value in next row Sensitive      <=20 SENSITIVE(NOTE)    * MODERATE SERRATIA MARCESCENS  MRSA PCR Screening     Status: None   Collection Time: 08/31/15 11:33 PM  Result Value Ref Range Status   MRSA by PCR NEGATIVE NEGATIVE Final    Comment:        The GeneXpert MRSA Assay (FDA approved for NASAL specimens only), is one component of a comprehensive MRSA colonization surveillance program. It is not intended to diagnose MRSA infection nor to guide or monitor treatment for MRSA infections.   C difficile quick scan w PCR reflex     Status: None   Collection Time: 09/07/15 12:29 PM  Result Value Ref Range Status   C Diff antigen NEGATIVE NEGATIVE Final   C Diff toxin NEGATIVE NEGATIVE Final   C Diff interpretation Negative for toxigenic C. difficile  Final    Impression/Plan:  1. Fournier's gangrene - no more surgical needs and wound looks good.  Infection now controlled.  Continue with antibiotics for 10 more days with ongoing wound care Will change antibiotics to Bactrim DS 1 twice a day, Augmentin 875 twice a day and fluconazole 200 mg once a day 2. Diarrhea - not significant, C diff negative.  I will sign off, call with questions

## 2015-09-08 ENCOUNTER — Inpatient Hospital Stay
Admission: AD | Admit: 2015-09-08 | Discharge: 2015-10-05 | Disposition: A | Discharge status: Skilled Nursing Facility | Payer: Self-pay | Source: Ambulatory Visit | Attending: Plastic Surgery | Admitting: Plastic Surgery

## 2015-09-08 ENCOUNTER — Other Ambulatory Visit (HOSPITAL_COMMUNITY): Payer: Self-pay

## 2015-09-08 DIAGNOSIS — L120 Bullous pemphigoid: Secondary | ICD-10-CM

## 2015-09-08 DIAGNOSIS — E46 Unspecified protein-calorie malnutrition: Secondary | ICD-10-CM

## 2015-09-08 DIAGNOSIS — I2510 Atherosclerotic heart disease of native coronary artery without angina pectoris: Secondary | ICD-10-CM

## 2015-09-08 DIAGNOSIS — I739 Peripheral vascular disease, unspecified: Secondary | ICD-10-CM

## 2015-09-08 DIAGNOSIS — S31109A Unspecified open wound of abdominal wall, unspecified quadrant without penetration into peritoneal cavity, initial encounter: Secondary | ICD-10-CM

## 2015-09-08 DIAGNOSIS — F17210 Nicotine dependence, cigarettes, uncomplicated: Secondary | ICD-10-CM

## 2015-09-08 DIAGNOSIS — Z720 Tobacco use: Secondary | ICD-10-CM

## 2015-09-08 DIAGNOSIS — N493 Fournier gangrene: Secondary | ICD-10-CM

## 2015-09-08 LAB — BASIC METABOLIC PANEL
ANION GAP: 7 (ref 5–15)
BUN: 18 mg/dL (ref 6–20)
CALCIUM: 8.2 mg/dL — AB (ref 8.9–10.3)
CHLORIDE: 102 mmol/L (ref 101–111)
CO2: 28 mmol/L (ref 22–32)
Creatinine, Ser: 0.58 mg/dL — ABNORMAL LOW (ref 0.61–1.24)
GFR calc Af Amer: >60 mL/min (ref >60)
GFR calc non Af Amer: >60 mL/min (ref >60)
GLUCOSE: 123 mg/dL — AB (ref 65–99)
POTASSIUM: 3.9 mmol/L (ref 3.5–5.1)
Sodium: 137 mmol/L (ref 135–145)

## 2015-09-08 LAB — CBC
HEMATOCRIT: 31.3 % — AB (ref 39.0–52.0)
HEMOGLOBIN: 9.9 g/dL — AB (ref 13.0–17.0)
MCH: 30.6 pg (ref 26.0–34.0)
MCHC: 31.6 g/dL (ref 30.0–36.0)
MCV: 96.6 fL (ref 78.0–100.0)
Platelets: 398 10*3/uL (ref 150–400)
RBC: 3.24 MIL/uL — ABNORMAL LOW (ref 4.22–5.81)
RDW: 15.9 % — ABNORMAL HIGH (ref 11.5–15.5)
WBC: 10.4 10*3/uL (ref 4.0–10.5)

## 2015-09-08 MED ORDER — AMOXICILLIN-POT CLAVULANATE 875-125 MG PO TABS: 1.0000 | ORAL_TABLET | Freq: Two times a day (BID) | ORAL | Status: DC

## 2015-09-08 MED ORDER — PREDNISOLONE ACETATE 1 % OP SUSP: 1.0000 [drp] | Freq: Four times a day (QID) | OPHTHALMIC | Status: DC

## 2015-09-08 MED ORDER — HEPARIN SODIUM (PORCINE) 5000 UNIT/ML IJ SOLN: 5000.0000 [IU] | Freq: Three times a day (TID) | INTRAMUSCULAR | Status: DC

## 2015-09-08 MED ORDER — POLYVINYL ALCOHOL 1.4 % OP SOLN: 1.0000 [drp] | OPHTHALMIC | Status: DC | PRN

## 2015-09-08 MED ORDER — FLUCONAZOLE 200 MG PO TABS: 200.0000 mg | ORAL_TABLET | Freq: Every day | ORAL | Status: DC

## 2015-09-08 MED ORDER — SACCHAROMYCES BOULARDII 250 MG PO CAPS: 250.0000 mg | ORAL_CAPSULE | Freq: Two times a day (BID) | ORAL | Status: DC

## 2015-09-08 MED ORDER — SULFAMETHOXAZOLE-TRIMETHOPRIM 800-160 MG PO TABS: 1.0000 | ORAL_TABLET | Freq: Two times a day (BID) | ORAL | Status: DC

## 2015-09-08 MED ORDER — METOPROLOL SUCCINATE ER 25 MG PO TB24: 25.0000 mg | ORAL_TABLET | Freq: Every day | ORAL | Status: AC

## 2015-09-08 MED ORDER — PREDNISONE 10 MG PO TABS: 10.0000 mg | ORAL_TABLET | Freq: Every day | ORAL | Status: DC

## 2015-09-08 NOTE — Progress Notes (Signed)
Report has been called to [facility], all questions and concerns were addressed with that nurse. Pt is alert and oriented times four, VSS, on RA, NSL to RFA will remain, foley placed by urology will remain, dressing to perineum is clean dry and intact, all left over supplies for dressing changes were sent with the pt. The skin especially on the BUE of the patient is very thin and fragile, he was very weepy from [place] at one point this admission, he has several skin tears to BUE all covered with [place] foam dressings. His E cigarette was returned from security and given back to the pt, all other personal belongings were either taken home by the pts nephew or sent with the pt (cell phone, cell phone charger, eyeglasses, and eye drops packed up and personally given to the pt. Pt premedicated for pain before transfer. [facility] transferring to [facility].

## 2015-09-08 NOTE — Progress Notes (Signed)
4 Days Post-Op Subjective: Patient was sleeping quietly this morning and I did not disturb him. Nurses report no major changes overnight. It is not clear to me that the wound care consultation that was ordered 2 days ago has actually been accomplished. Dressing was changed yesterday. Equal soiling the wound has not been is problematic.  Objective: Vital signs in last 24 hours: Temp:  [97.6 F (36.4 C)-99.1 F (37.3 C)] 99.1 F (37.3 C) (04/07 0400) Pulse Rate:  [91-101] 91 (04/07 0541) Resp:  [14-20] 20 (04/07 0541) BP: (121-154)/(84-92) 121/92 mmHg (04/07 0541) SpO2:  [97 %-98 %] 98 % (04/07 0541)  Intake/Output from previous day: 04/06 0701 - 04/07 0700 In: 250 [P.O.:250] Out: 950 [Urine:950] Intake/Output this shift:    Physical Exam:    Lab Results:  Recent Labs  09/07/15 0308 09/08/15 0320  HGB 9.5* 9.9*  HCT 29.8* 31.3*   BMET  Recent Labs  09/07/15 0308 09/08/15 0320  NA 141 137  K 4.1 3.9  CL 106 102  CO2 29 28  GLUCOSE 127* 123*  BUN 17 18  CREATININE 0.53* 0.58*  CALCIUM 8.2* 8.2*   No results for input([name]): LABPT, INR in the last 72 hours. No results for input([name]): LABURIN in the last 72 hours. Results for orders placed or performed during the hospital encounter of 08/31/15  Tissue culture     Status: None   Collection Time: 08/31/15  6:55 PM  Result Value Ref Range Status   Specimen Description ABDOMEN TISSUE NECROTIC  Final   Special Requests NONE  Final   Gram Stain   Final    FEW WBC PRESENT,BOTH PMN AND MONONUCLEAR NO ORGANISMS SEEN Performed at [facility]    Culture   Final    [place] Performed at [facility]    Report Status 09/03/2015 FINAL  Final   Organism ID, Bacteria SERRATIA MARCESCENS  Final      Susceptibility   Serratia marcescens - MIC*    CEFAZOLIN >=64 RESISTANT Resistant     CEFEPIME <=1 SENSITIVE Sensitive     CEFTAZIDIME <=1 SENSITIVE Sensitive    CEFTRIAXONE <=1 SENSITIVE Sensitive     CIPROFLOXACIN <=0.25 SENSITIVE Sensitive     GENTAMICIN <=1 SENSITIVE Sensitive     TOBRAMYCIN 8 INTERMEDIATE Intermediate     TRIMETH/SULFA Value in next row Sensitive      <=20 SENSITIVE(NOTE)    * MODERATE SERRATIA MARCESCENS  MRSA PCR Screening     Status: None   Collection Time: 08/31/15 11:33 PM  Result Value Ref Range Status   MRSA by PCR NEGATIVE NEGATIVE Final    Comment:        The GeneXpert MRSA Assay (FDA approved for NASAL specimens only), is one component of a comprehensive MRSA colonization surveillance program. It is not intended to diagnose MRSA infection nor to guide or monitor treatment for MRSA infections.   C difficile quick scan w PCR reflex     Status: None   Collection Time: 09/07/15 12:29 PM  Result Value Ref Range Status   C Diff antigen NEGATIVE NEGATIVE Final   C Diff toxin NEGATIVE NEGATIVE Final   C Diff interpretation Negative for toxigenic C. difficile  Final    Studies/Results: No results found.  Assessment/Plan:   Very large and complex wound. He has been healing appropriately with good granulation tissue and no obvious need for ongoing debridement. At this point there is little for urologic service to do  in this patient is going to require ongoing expert wound care and plastic surgery consultation for consideration of eventual coverage with either some muscle based flaps as well as split-thickness skin grafts to cover the penile shaft.  Would suggest plastic surgery consultation. [facility] I have asked nursing staff to recontact the wound care specialist and to make sure the dressing is changed today prior to transfer.   LOS: 8 days   Rodney [name] 09/08/2015, 8:03 AM

## 2015-09-08 NOTE — Discharge Summary (Signed)
Physician Discharge Summary  Rodney [name] [id number] DOB: Jun 22, 1950 DOA: 08/31/2015  PCP: Simona Huh, MD  Admit date: 08/31/2015 Discharge date: 09/08/2015  Time spent: 60 minutes  Recommendations for Outpatient Follow-up:  1. Needs a wound care eval at [facility]- wound care noted from 4/6: Cleanse wounds to abdomen, scrotum and penis, extending through perineum with NS. Gently fill defect with NS moist kerlix. Cover with ABD pads and tape. Mesh underwear to minimize tape usage. Change daily.  2. Dr Migdalia Dk- Dillingham will see him at [facility] 3. Follow Hb- obtain anemia panel tomorrow  Discharge Condition: stable    Discharge Diagnoses:  Principal Problem:   Fournier's gangrene of scrotum Active Problems:   PAD (peripheral artery disease) (HCC)   Protein calorie malnutrition (HCC)   Anemia, normocytic normochromic   Benign essential HTN   Cigarette smoker   CAD in native artery with stent   Bullous pemphigoid   History of present illness:  Rodney [name] is a 65 y.o. Male with h/o PAD with L femoral endartrectomy, CAD patent stent on 08/04/15 cath, chronic skin disorder on steroids, HTN, HLD, prostate CA s/p prostatectomy and radiation.  Admitted 3/14 - 3/21 for scrotal cellulitis. Discharged to SNF on Doxycycline and Cephalexin. Returned on 3/30 with Fornier's gangrene. Taken to OR on the day of admision  Hospital Course:  Principal Problem:   Fournier's gangrene- immunocompromised on chronic Prednisone -s/p extensive debridement- went to OR 3/30, 4/1 and 4/4 - per Dr Risa Grill, he no longer needs debridement and now needs aggressive wound care and needs a plastic surgery eval- I have spoken with Dr Migdalia Dk- Dillingham who will see him at [facility] where he is being transferred today  - antibiotics being managed per ID- recommended Bactrim DS 1 tab BID,Augmentin 875 twice a day and fluconazole 200 mg once a day for 10 days- stop date would be 4/16 - added florastor  - has  foley cath to prevent soiling of wound  - Dr Risa Grill trying to determine if [facility] will give him privileges to round on the patient   Active Problems:  HTN/ bradycardia - Toprol 25 mg BID at home- held due to bradycardia and 0.67 sec pause - resumed at 25 mg daily for tachycardia- rate and rhythm has been stable since  Diarrhea - C diff negative- given 2 tabs of Lomotil on 4/6- now has formed stool  Poor PO intake- Protein calorie malnutrition - does not like the food- cont Prostat, regular diet- supplements     Anemia, normocytic normochromic   - given 2 U PRBC for Hb of 7.5 on 3/30- ? anemia of acute illness, no blood loss other than what may have occurred in OR - following Hb closely- noted drop to 9 on 4/6- no blood loss noted -cont to follow Hb closely  Hypokalemia/ hypomagnesemia - replaced-was in relation to diarrhea   Bullous Pemphigoid - as Prednisone will inhibit his wound healing,  I spoke with Magnus Sinning NP at [facility] today- he has biopsy proven disease- he missed his last appt on 3/16 as he was in the hospital - she has tried to taper the Prednisone over a 1 mo period in the past but his condition recurred - on Prednisone 10 mg BID at home and here- [place] Black recommends 10mg  daily for a couple of weeks, then 5mg  daily for a couple of weeks and then stop. I feel that 5 mg/ day should not prevent wound healing.  If he is to develop  blisters during this tapering periods, dose will need to be increased. Please call her for further questions- [phone]  Smoker - had E cig out on 4/5 - counseled not to smoke  HLD - statin  CAD with stent - ASA on hold - will resume today  PVD s/p left femoral endartrectomy  DVT Prophylaxis: Heparin  Procedures:  Debridement, dressing changes in OR  Consultations:  Urology, Gen surgery, ID  Discharge Exam: Filed Weights   08/31/15 1615 08/31/15 2300 09/05/15 0100  Weight: 60.782 kg (134 lb) 55.8 kg  (123 lb 0.3 oz) 64.9 kg (143 lb 1.3 oz)   Filed Vitals:   09/08/15 0541 09/08/15 0929  BP: 121/92 151/67  Pulse: 91 96  Temp:    Resp: 20 17    General: AAO x 3, no distress Cardiovascular: RRR, no murmurs  Respiratory: clear to auscultation bilaterally GI: soft, non-tender, non-distended, bowel sound positive Skin: extensive debridement of lower abdomen and scrotum-  fungal infection on buttocks  Discharge Instructions You were cared for by a hospitalist during your hospital stay. If you have any questions about your discharge medications or the care you received while you were in the hospital after you are discharged, you can call the unit and asked to speak with the hospitalist on call if the hospitalist that took care of you is not available. Once you are discharged, your primary care physician will handle any further medical issues. Please note that NO REFILLS for any discharge medications will be authorized once you are discharged, as it is imperative that you return to your primary care physician (or establish a relationship with a primary care physician if you do not have one) for your aftercare needs so that they can reassess your need for medications and monitor your lab values.      Discharge Instructions    Discharge instructions    Complete by:  As directed   Regular diet     Increase activity slowly    Complete by:  As directed             Medication List    STOP taking these medications        cephALEXin 500 MG capsule  Commonly known as:  KEFLEX     doxycycline 100 MG tablet  Commonly known as:  VIBRA-TABS     DUREZOL 0.05 % Emul  Generic drug:  Difluprednate     pantoprazole 40 MG tablet  Commonly known as:  PROTONIX     triamcinolone cream 0.1 %  Commonly known as:  KENALOG      TAKE these medications        amoxicillin-clavulanate 875-125 MG tablet  Commonly known as:  AUGMENTIN  Take 1 tablet by mouth every 12 (twelve) hours.     aspirin 81  MG EC tablet  Take 1 tablet (81 mg total) by mouth daily.     atorvastatin 80 MG tablet  Commonly known as:  LIPITOR  Take 1 tablet (80 mg total) by mouth daily at 6 PM.     fluconazole 200 MG tablet  Commonly known as:  DIFLUCAN  Take 1 tablet (200 mg total) by mouth daily.     heparin 5000 UNIT/ML injection  Inject 1 mL (5,000 Units total) into the skin every 8 (eight) hours.     HYDROcodone-acetaminophen 5-325 MG tablet  Commonly known as:  NORCO/VICODIN  Take 1-2 tablets by mouth every 4 (four) hours as needed for moderate pain.  latanoprost 0.005 % ophthalmic solution  Commonly known as:  XALATAN  Place 1 drop into both eyes at bedtime.     metoprolol succinate 25 MG 24 hr tablet  Commonly known as:  TOPROL-XL  Take 1 tablet (25 mg total) by mouth daily.     nitroGLYCERIN 0.4 MG SL tablet  Commonly known as:  NITROSTAT  Place 1 tablet (0.4 mg total) under the tongue every 5 (five) minutes x 3 doses as needed for chest pain.     polyvinyl alcohol 1.4 % ophthalmic solution  Commonly known as:  LIQUIFILM TEARS  Place 1 drop into the right eye as needed for dry eyes.     prednisoLONE acetate 1 % ophthalmic suspension  Commonly known as:  PRED FORTE  Place 1 drop into the right eye 4 (four) times daily.     predniSONE 10 MG tablet  Commonly known as:  DELTASONE  Take 1 tablet (10 mg total) by mouth daily with breakfast.     PROCEL Powd  Take 2 scoop by mouth 2 (two) times daily.     saccharomyces boulardii 250 MG capsule  Commonly known as:  FLORASTOR  Take 1 capsule (250 mg total) by mouth 2 (two) times daily.     sulfamethoxazole-trimethoprim 800-160 MG tablet  Commonly known as:  BACTRIM DS,SEPTRA DS  Take 1 tablet by mouth every 12 (twelve) hours.       Allergies  Allergen Reactions  . Other Rash    polyester and metals except titanium.      The results of significant diagnostics from this hospitalization (including imaging, microbiology,  ancillary and laboratory) are listed below for reference.    Significant Diagnostic Studies: Dg Chest 2 View  08/15/2015  CLINICAL DATA:  Shortness of breath. EXAM: CHEST  2 VIEW COMPARISON:  August 03, 2015. FINDINGS: The heart size and mediastinal contours are within normal limits. Both lungs are clear. No pneumothorax or pleural effusion is noted. The visualized skeletal structures are unremarkable. IMPRESSION: No active cardiopulmonary disease. Electronically Signed   By: Marijo Conception, M.D.   On: 08/15/2015 15:42   Ct Pelvis Wo Contrast  08/31/2015  CLINICAL DATA:  65 year old male with severe scrotal pain. Increased pain with gangrenous tissue today. Initial encounter. EXAM: CT PELVIS WITHOUT CONTRAST TECHNIQUE: Multidetector CT imaging of the pelvis was performed following the standard protocol without intravenous contrast. COMPARISON:  CT Abdomen and Pelvis 08/15/2015. FINDINGS: New widespread subcutaneous gas throughout the scrotum and tracking cephalad to the suprapubic space and inferior rectus muscles. No pelvic floor involvement. No retroperitoneal involvement. There is an unchanged simple appearing circumscribed fluid collection in the medial left thigh. Extensive Aortoiliac calcified atherosclerosis noted. Calcified plaque continues into the bilateral femoral arteries, worse on the right. No pelvic free fluid. Stool ball in the rectum. Small volume of gas in the urinary bladder. Retained stool in the distal colon. Negative visualized distal small bowel. Negative appendix and visible cecum. No pneumoperitoneum. Osteopenia.   No acute osseous abnormality identified. IMPRESSION: 1. Positive for Fournier's gangrene necrotizing fasciitis. Subcutaneous gas tracks from the scrotum cephalad to the superficial suprapubic soft tissues an inferior rectus muscles. This critical value/emergent finding was discussed by telephone with CHRISTOPHER LAWYER on 08/31/2015 at 17:02 . 2. Small volume of gas within  the lumen of the urinary bladder. 3. No pelvic floor or retroperitoneal inflammation. Negative visualized lower abdominal viscera. 4. Severe calcified atherosclerosis of the aorta, pelvic arteries and femoral arteries. Electronically Signed   By:  Genevie Ann M.D.   On: 08/31/2015 17:04   US Abdomen Complete  08/16/2015  CLINICAL DATA:  Generalized abdominal pain EXAM: ABDOMEN ULTRASOUND COMPLETE COMPARISON:  None. FINDINGS: Gallbladder: No gallstones or wall thickening visualized. No sonographic Murphy sign noted by sonographer. Common bile duct: Diameter: 3.4 mm Liver: No focal lesion identified. Within normal limits in parenchymal echogenicity. IVC: No abnormality visualized. Pancreas: Visualized portion unremarkable. Spleen: Size and appearance within normal limits. Right Kidney: Length: 10.2 cm. Echogenicity within normal limits. No mass or hydronephrosis visualized. Left Kidney: Length: 9.6 cm. Echogenicity within normal limits. No mass or hydronephrosis visualized. Abdominal aorta: No aneurysm visualized. Other findings: Small right pleural effusion. IMPRESSION: 1. No cholelithiasis or sonographic evidence of acute cholecystitis. 2. No obstructive uropathy. Electronically Signed   By: Kathreen Devoid   On: 08/16/2015 16:25   US Scrotum  08/15/2015  CLINICAL DATA:  Enlarged painful, inflamed scrotum EXAM: ULTRASOUND OF SCROTUM TECHNIQUE: Complete ultrasound examination of the testicles, epididymis, and other scrotal structures was performed. COMPARISON:  None available FINDINGS: Right testicle Measurements: 3.2 x 2.2 x 2.3 cm. No mass or microlithiasis visualized. Left testicle Measurements: 3.2 x 1.8 x 2.3 cm. No mass or microlithiasis visualized. Right epididymis: Enlarged, heterogeneous in appearance, no significant hypervascularity. Left epididymis: Small left epididymal incidental cyst measures 3 mm. Left epididymis also appears enlarged, heterogeneous and ill-defined. Hydrocele: Heterogeneous complex mixed  echogenicity hydroceles present bilaterally with diffuse scrotal edema and skin thickening, difficult to exclude pyocele / infection. Varicocele:  None visualized. IMPRESSION: Diffuse scrotal skin thickening and edema with bilateral complex heterogeneous hydroceles and/or pyoceles. Difficult to exclude abscess. No testicular abnormality. Electronically Signed   By: Jerilynn Mages.  Shick M.D.   On: 08/15/2015 16:42   Ct Abdomen Pelvis W Contrast  08/15/2015  CLINICAL DATA:  Groin pain with walking and moving since 08/10/2015, swelling, dysuria, on Cipro for possible UTI, pain is burning and non radiating, no fever or chills, history prostate cancer, former smoker EXAM: CT ABDOMEN AND PELVIS WITH CONTRAST TECHNIQUE: Multidetector CT imaging of the abdomen and pelvis was performed using the standard protocol following bolus administration of intravenous contrast. Sagittal and coronal MPR images reconstructed from axial data set. CONTRAST:  129mL OMNIPAQUE IOHEXOL 300 MG/ML SOLN IV. Dilute oral contrast. COMPARISON:  CT abdomen and pelvis 02/09/2015 FINDINGS: Lung bases clear. Atrophic pancreas. Liver, gallbladder, spleen, pancreas, kidneys, and adrenal glands otherwise unremarkable. Extensive atherosclerotic calcification greatest at the iliac arteries. Coronary arterial calcifications as well. 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 Normal appendix. Unremarkable bladder and ureters. Prostate gland surgically absent. Question small BILATERAL inguinal hernias containing fat. Scattered edema within the subcutaneous soft tissues at the inferior pelvis particularly anteriorly. Significant soft tissue swelling/ edema and question hydroceles in scrotum. Stomach and bowel loops normal appearance. No mass, adenopathy, free air, or free fluid. No acute osseous lesions. IMPRESSION: Question small BILATERAL inguinal hernias containing fat. Significant soft tissue swelling of the scrotum with question  BILATERAL hydroceles. No acute intra-abdominal or intrapelvic abnormalities otherwise seen. Extensive atherosclerotic calcification as above. Electronically Signed   By: Lavonia Dana M.D.   On: 08/15/2015 17:25   Dg Chest Port 1 View  09/04/2015  CLINICAL DATA:  Hypoxia EXAM: PORTABLE CHEST 1 VIEW COMPARISON:  08/15/2015 FINDINGS: Cardiac shadow is stable. The lungs are well aerated bilaterally. Mild interstitial changes are noted. No focal confluent infiltrate is seen. No acute bony abnormality is noted. IMPRESSION: No acute abnormality seen. Electronically Signed  By: Inez Catalina M.D.   On: 09/04/2015 02:53    Microbiology: Recent Results (from the past 240 hour(s))  Tissue culture     Status: None   Collection Time: 08/31/15  6:55 PM  Result Value Ref Range Status   Specimen Description ABDOMEN TISSUE NECROTIC  Final   Special Requests NONE  Final   Gram Stain   Final    FEW WBC PRESENT,BOTH PMN AND MONONUCLEAR NO ORGANISMS SEEN Performed at [facility]    Culture   Final    [place] Performed at [facility]    Report Status 09/03/2015 FINAL  Final   Organism ID, Bacteria SERRATIA MARCESCENS  Final      Susceptibility   Serratia marcescens - MIC*    CEFAZOLIN >=64 RESISTANT Resistant     CEFEPIME <=1 SENSITIVE Sensitive     CEFTAZIDIME <=1 SENSITIVE Sensitive     CEFTRIAXONE <=1 SENSITIVE Sensitive     CIPROFLOXACIN <=0.25 SENSITIVE Sensitive     GENTAMICIN <=1 SENSITIVE Sensitive     TOBRAMYCIN 8 INTERMEDIATE Intermediate     TRIMETH/SULFA Value in next row Sensitive      <=20 SENSITIVE(NOTE)    * MODERATE SERRATIA MARCESCENS  MRSA PCR Screening     Status: None   Collection Time: 08/31/15 11:33 PM  Result Value Ref Range Status   MRSA by PCR NEGATIVE NEGATIVE Final    Comment:        The GeneXpert MRSA Assay (FDA approved for NASAL specimens only), is one component of a comprehensive MRSA  colonization surveillance program. It is not intended to diagnose MRSA infection nor to guide or monitor treatment for MRSA infections.   C difficile quick scan w PCR reflex     Status: None   Collection Time: 09/07/15 12:29 PM  Result Value Ref Range Status   C Diff antigen NEGATIVE NEGATIVE Final   C Diff toxin NEGATIVE NEGATIVE Final   C Diff interpretation Negative for toxigenic C. difficile  Final     Labs: Basic Metabolic Panel:  Recent Labs Lab 09/02/15 0953 09/03/15 0318 09/05/15 0318 09/06/15 1034 09/07/15 0308 09/08/15 0320  NA 139 139 140  --  141 137  K 3.2* 3.7 3.1*  --  4.1 3.9  CL 105 107 105  --  106 102  CO2 26 26 26   --  29 28  GLUCOSE 91 123* 95  --  127* 123*  BUN 17 17 16   --  17 18  CREATININE 0.60* 0.38* 0.57*  --  0.53* 0.58*  CALCIUM 8.5* 8.2* 8.5*  --  8.2* 8.2*  MG  --   --   --  1.4* 1.9  --    Liver Function Tests: No results for input(s): AST, ALT, ALKPHOS, BILITOT, PROT, ALBUMIN in the last 168 hours. No results for input(s): LIPASE, AMYLASE in the last 168 hours. No results for input(s): AMMONIA in the last 168 hours. CBC:  Recent Labs Lab 09/05/15 0318 09/07/15 0308 09/08/15 0320  WBC 14.8* 12.4* 10.4  HGB 11.2* 9.5* 9.9*  HCT 34.8* 29.8* 31.3*  MCV 95.1 95.5 96.6  PLT 379 380 398   Cardiac Enzymes:  Recent Labs Lab 09/04/15 0232 09/04/15 0752 09/04/15 1447  TROPONINI <0.03 <0.03 <0.03   BNP: BNP (last 3 results)  Recent Labs  08/15/15 1913  BNP 365.2*    ProBNP (last 3 results) No results for input(s): PROBNP in the last 8760 hours.  CBG: No results for input(s): GLUCAP in the last 168 hours.     SignedDebbe Odea, MD [facility] 09/08/2015, 10:28 AM

## 2015-09-09 LAB — COMPREHENSIVE METABOLIC PANEL
ALK PHOS: 129 U/L — AB (ref 38–126)
ALT: 17 U/L (ref 17–63)
AST: 18 U/L (ref 15–41)
Albumin: 1.5 g/dL — ABNORMAL LOW (ref 3.5–5.0)
Anion gap: 10 (ref 5–15)
BUN: 12 mg/dL (ref 6–20)
CALCIUM: 8.5 mg/dL — AB (ref 8.9–10.3)
CHLORIDE: 99 mmol/L — AB (ref 101–111)
CO2: 28 mmol/L (ref 22–32)
CREATININE: 0.71 mg/dL (ref 0.61–1.24)
Glucose, Bld: 76 mg/dL (ref 65–99)
Potassium: 3.6 mmol/L (ref 3.5–5.1)
Sodium: 137 mmol/L (ref 135–145)
Total Bilirubin: 0.5 mg/dL (ref 0.3–1.2)
Total Protein: 4.3 g/dL — ABNORMAL LOW (ref 6.5–8.1)

## 2015-09-09 LAB — T4, FREE: FREE T4: 1.55 ng/dL — AB (ref 0.61–1.12)

## 2015-09-09 LAB — CBC
HCT: 33.5 % — ABNORMAL LOW (ref 39.0–52.0)
Hemoglobin: 10.2 g/dL — ABNORMAL LOW (ref 13.0–17.0)
MCH: 29.3 pg (ref 26.0–34.0)
MCHC: 30.4 g/dL (ref 30.0–36.0)
MCV: 96.3 fL (ref 78.0–100.0)
PLATELETS: 412 10*3/uL — AB (ref 150–400)
RBC: 3.48 MIL/uL — AB (ref 4.22–5.81)
RDW: 15.9 % — ABNORMAL HIGH (ref 11.5–15.5)
WBC: 12.7 10*3/uL — AB (ref 4.0–10.5)

## 2015-09-09 LAB — PROTIME-INR
INR: 0.97 (ref 0.00–1.49)
PROTHROMBIN TIME: 13.1 s (ref 11.6–15.2)

## 2015-09-09 LAB — TSH: TSH: 2.027 u[IU]/mL (ref 0.350–4.500)

## 2015-09-11 LAB — HEMOGLOBIN A1C
Hgb A1c MFr Bld: 5.8 % — ABNORMAL HIGH (ref 4.8–5.6)
Mean Plasma Glucose: 120 mg/dL

## 2015-09-12 LAB — CBC
HEMATOCRIT: 30.2 % — AB (ref 39.0–52.0)
HEMOGLOBIN: 9.4 g/dL — AB (ref 13.0–17.0)
MCH: 29.8 pg (ref 26.0–34.0)
MCHC: 31.1 g/dL (ref 30.0–36.0)
MCV: 95.9 fL (ref 78.0–100.0)
PLATELETS: 357 10*3/uL (ref 150–400)
RBC: 3.15 MIL/uL — AB (ref 4.22–5.81)
RDW: 16.7 % — ABNORMAL HIGH (ref 11.5–15.5)
WBC: 11.2 10*3/uL — AB (ref 4.0–10.5)

## 2015-09-12 LAB — BASIC METABOLIC PANEL
ANION GAP: 8 (ref 5–15)
BUN: 10 mg/dL (ref 6–20)
CHLORIDE: 103 mmol/L (ref 101–111)
CO2: 27 mmol/L (ref 22–32)
Calcium: 8.5 mg/dL — ABNORMAL LOW (ref 8.9–10.3)
Creatinine, Ser: 0.63 mg/dL (ref 0.61–1.24)
GFR calc Af Amer: >60 mL/min (ref >60)
GLUCOSE: 84 mg/dL (ref 65–99)
POTASSIUM: 4 mmol/L (ref 3.5–5.1)
Sodium: 138 mmol/L (ref 135–145)

## 2015-09-13 ENCOUNTER — Encounter: Payer: Self-pay | Admitting: Plastic Surgery

## 2015-09-13 ENCOUNTER — Other Ambulatory Visit: Payer: Self-pay | Admitting: Plastic Surgery

## 2015-09-13 DIAGNOSIS — N493 Fournier gangrene: Secondary | ICD-10-CM

## 2015-09-13 NOTE — Consult Note (Signed)
Reason for Consult:Fournier gangrene Referring Physician: Debbe Odea, MD  Rodney [name] is an 65 y.o. male.  HPI: The patient is a 65 yrs old wm here for rehabilitation.  He was admitted to [facility] one week ago with complaints of groin pain and swelling.  He states today that he knew something was wrong and had pain for two weeks prior to presentation.  By the time he got the hospital he has an infection and was diagnosed with necrotizing fasciitis / fournier gangrene.  He was taken to the OR by urology for debridement and excision.  He is now at [facility] for rehab.  The area involves the full lower abdominal area, groin and scrotal area.  He is undergoing painful dressing changes 1 - 2 times a day.  He is not eating well.  Vitamins were started.  He has multiple medical conditions.  He was radiated for prostate cancer ~ 6 yrs ago.  Past Medical History  Diagnosis Date  . Prostate cancer ([place]) 05/03/2009    Prostatectomy/Adenocrcinoma  . Heart murmur     history  . Cataract     left eye surgery/   . Radiation 07/02/11-08/22/11    Prostate fossa 68.4 gray 38 fractions  . Cellulitis of scrotum   . Hyponatremia   . Bullous pemphigoid   . Peripheral vascular disease ([place])   . AKI (acute kidney injury) ([place])   . Thrombocytopenia ([place])   . CAD (coronary artery disease)   . Severe malnutrition ([place])   . Alcohol dependence ([place])   . Benign essential HTN 09/01/2015    Past Surgical History  Procedure Laterality Date  . Left eye surgery    . Hemorrhoid surgery    . Robot assisted laparoscopic radical prostatectomy  05/03/2009  . Wrist surgery      left  . Left heart catheterization with coronary angiogram N/A 02/25/2012    Procedure: LEFT HEART CATHETERIZATION WITH CORONARY ANGIOGRAM;  Surgeon: Clent Demark, MD;  Location: [facility] CATH LAB;  Service: Cardiovascular;  Laterality: N/A;  . Peripheral vascular catheterization N/A 02/10/2015    Procedure: Abdominal Aortogram;  Surgeon: Elam Dutch, MD;  Location: [facility] CV LAB;  Service: Cardiovascular;  Laterality: N/A;  . Peripheral vascular catheterization Bilateral 02/10/2015    Procedure: Lower Extremity Angiography;  Surgeon: Elam Dutch, MD;  Location: [facility] CV LAB;  Service: Cardiovascular;  Laterality: Bilateral;  . Endarterectomy femoral Left 02/17/2015    Procedure: LEFT FEMORAl ENDARTERECTOMY ;  Surgeon: Angelia Mould, MD;  Location: [place];  Service: Vascular;  Laterality: Left;  . Patch angioplasty Left 02/17/2015    Procedure: VEIN PATCH ANGIOPLASTY;  Surgeon: Angelia Mould, MD;  Location: [place];  Service: Vascular;  Laterality: Left;  . Cardiac catheterization N/A 08/04/2015    Procedure: Left Heart Cath and Coronary Angiography;  Surgeon: Charolette Forward, MD;  Location: [place] [facility] CV LAB;  Service: Cardiovascular;  Laterality: N/A;  . Irrigation and debridement abscess N/A 08/31/2015    Procedure: IRRIGATION AND DEBRIDEMENT SCROTAL  ABSCESS;  Surgeon: Kathie Rhodes, MD;  Location: [facility];  Service: Urology;  Laterality: N/A;  . Irrigation and debridement abscess N/A 08/31/2015    Procedure: IRRIGATION AND DEBRIDEMENT ABSCESS;  Surgeon: Rolm Bookbinder, MD;  Location: [facility] ORS;  Service: General;  Laterality: N/A;  . Irrigation and debridement abscess N/A 09/02/2015    Procedure: IRRIGATION AND DEBRIDEMENT OF SCROTUM, ABDOMEN AND PERINEUM  WITH DRESSING CHANGE;  Surgeon: Nickie Retort,  MD;  Location: [facility];  Service: Urology;  Laterality: N/A;  . Irrigation and debridement abscess N/A 09/04/2015    Procedure: IRRIGATION AND DEBRIDEMENT and dressing change ofABSCESS;  Surgeon: Irine Seal, MD;  Location: [facility];  Service: Urology;  Laterality: N/A;  to penis and scrotum    Family History  Problem Relation Age of Onset  . Uterine cancer Mother 54    going to [employer]  . Breast cancer Sister     Social History:  reports that he quit smoking about 9 months ago. His smoking use included  Cigarettes. He has a 72 pack-year smoking history. He has never used smokeless tobacco. He reports that he drinks about 3.6 oz of alcohol per week. He reports that he does not use illicit drugs.  Allergies:  Allergies  Allergen Reactions  . Other Rash    polyester and metals except titanium.    Medications: I have reviewed the patient's current medications.  Results for orders placed or performed during the hospital encounter of 09/08/15 (from the past 48 hour(s))  CBC     Status: Abnormal   Collection Time: 09/12/15  7:05 AM  Result Value Ref Range   WBC 11.2 (H) 4.0 - 10.5 K/uL   RBC 3.15 (L) 4.22 - 5.81 MIL/uL   Hemoglobin 9.4 (L) 13.0 - 17.0 g/dL   HCT 30.2 (L) 39.0 - 52.0 %   MCV 95.9 78.0 - 100.0 fL   MCH 29.8 26.0 - 34.0 pg   MCHC 31.1 30.0 - 36.0 g/dL   RDW 16.7 (H) 11.5 - 15.5 %   Platelets 357 150 - 400 K/uL  Basic metabolic panel     Status: Abnormal   Collection Time: 09/12/15  7:05 AM  Result Value Ref Range   Sodium 138 135 - 145 mmol/L   Potassium 4.0 3.5 - 5.1 mmol/L   Chloride 103 101 - 111 mmol/L   CO2 27 22 - 32 mmol/L   Glucose, Bld 84 65 - 99 mg/dL   BUN 10 6 - 20 mg/dL   Creatinine, Ser 0.63 0.61 - 1.24 mg/dL   Calcium 8.5 (L) 8.9 - 10.3 mg/dL   GFR calc non Af Amer >60 >60 mL/min   GFR calc Af Amer >60 >60 mL/min    Comment: (NOTE) The eGFR has been calculated using the CKD EPI equation. This calculation has not been validated in all clinical situations. eGFR's persistently <60 mL/min signify possible Chronic Kidney Disease.    Anion gap 8 5 - 15    No results found.  Review of Systems  Constitutional: Positive for fever, chills and malaise/fatigue.  HENT: Negative.   Eyes: Negative.   Respiratory: Negative.   Cardiovascular: Negative.   Gastrointestinal: Negative.   Genitourinary: Negative.   Musculoskeletal: Negative.   Skin: Negative.   Neurological: Positive for weakness.  Psychiatric/Behavioral: Negative.    There were no vitals  taken for this visit. Physical Exam  Constitutional: He is oriented to person, place, and time. He appears well-developed.  HENT:  Head: Normocephalic and atraumatic.  Eyes: Pupils are equal, round, and reactive to light.  Respiratory: Effort normal. No respiratory distress. He has no wheezes.  GI: Soft. He exhibits no distension. There is tenderness.    Neurological: He is alert and oriented to person, place, and time.  Skin: Skin is warm.  Psychiatric: He has a normal mood and affect. His behavior is normal. Judgment and thought content normal.    Assessment/Plan: Plan for OR for  debridement with Acell and possible VAC placement this week.  [name] 09/13/2015, 3:13 PM

## 2015-09-14 ENCOUNTER — Encounter (HOSPITAL_COMMUNITY)
Admission: AD | Disposition: A | Discharge status: Skilled Nursing Facility | Payer: Self-pay | Source: Ambulatory Visit | Attending: Plastic Surgery

## 2015-09-14 ENCOUNTER — Encounter (HOSPITAL_COMMUNITY): Payer: Self-pay | Admitting: Certified Registered Nurse Anesthetist

## 2015-09-14 HISTORY — PX: INCISION AND DRAINAGE OF WOUND: SHX1803

## 2015-09-14 LAB — GLUCOSE, CAPILLARY: Glucose-Capillary: 107 mg/dL — ABNORMAL HIGH (ref 65–99)

## 2015-09-14 SURGERY — IRRIGATION AND DEBRIDEMENT WOUND
Anesthesia: General | Site: Abdomen

## 2015-09-14 MED ORDER — ONDANSETRON HCL 4 MG/2ML IJ SOLN
INTRAMUSCULAR | Status: AC
  Filled 2015-09-14: qty 2

## 2015-09-14 MED ORDER — ONDANSETRON HCL 4 MG/2ML IJ SOLN
INTRAMUSCULAR | Status: AC
  Filled 2015-09-14: qty 4

## 2015-09-14 MED ORDER — MIDAZOLAM HCL 2 MG/2ML IJ SOLN
INTRAMUSCULAR | Status: AC
  Filled 2015-09-14: qty 2

## 2015-09-14 MED ORDER — LIDOCAINE HCL (CARDIAC) 20 MG/ML IV SOLN
INTRAVENOUS | Status: DC | PRN
  Administered 2015-09-14: 100 mg via INTRAVENOUS

## 2015-09-14 MED ORDER — CEFAZOLIN SODIUM-DEXTROSE 2-4 GM/100ML-% IV SOLN
2.0000 g | INTRAVENOUS | Status: AC
  Administered 2015-09-14: 2 g via INTRAVENOUS

## 2015-09-14 MED ORDER — PROPOFOL 10 MG/ML IV BOLUS
INTRAVENOUS | Status: DC | PRN
  Administered 2015-09-14: 140 mg via INTRAVENOUS

## 2015-09-14 MED ORDER — PHENYLEPHRINE HCL 10 MG/ML IJ SOLN
10.0000 mg | INTRAVENOUS | Status: DC | PRN
  Administered 2015-09-14: 60 ug/min via INTRAVENOUS

## 2015-09-14 MED ORDER — FENTANYL CITRATE (PF) 100 MCG/2ML IJ SOLN
INTRAMUSCULAR | Status: DC | PRN
  Administered 2015-09-14: 75 ug via INTRAVENOUS

## 2015-09-14 MED ORDER — FENTANYL CITRATE (PF) 250 MCG/5ML IJ SOLN
INTRAMUSCULAR | Status: AC
  Filled 2015-09-14: qty 5

## 2015-09-14 MED ORDER — HYDROMORPHONE HCL 1 MG/ML IJ SOLN: 0.2500 mg | INTRAMUSCULAR | Status: DC | PRN

## 2015-09-14 MED ORDER — MIDAZOLAM HCL 5 MG/5ML IJ SOLN
INTRAMUSCULAR | Status: DC | PRN
  Administered 2015-09-14: 2 mg via INTRAVENOUS

## 2015-09-14 MED ORDER — LACTATED RINGERS IV SOLN
INTRAVENOUS | Status: DC
  Administered 2015-09-14: 15:00:00 via INTRAVENOUS

## 2015-09-14 MED ORDER — SODIUM CHLORIDE 0.9 % IR SOLN
Status: DC | PRN
  Administered 2015-09-14: 500 mL

## 2015-09-14 MED ORDER — LIDOCAINE HCL (CARDIAC) 20 MG/ML IV SOLN
INTRAVENOUS | Status: AC
  Filled 2015-09-14: qty 5

## 2015-09-14 MED ORDER — CEFAZOLIN SODIUM-DEXTROSE 2-4 GM/100ML-% IV SOLN
INTRAVENOUS | Status: AC
  Filled 2015-09-14: qty 100

## 2015-09-14 MED ORDER — PHENYLEPHRINE HCL 10 MG/ML IJ SOLN
INTRAMUSCULAR | Status: DC | PRN
  Administered 2015-09-14 (×5): 80 ug via INTRAVENOUS

## 2015-09-14 MED ORDER — SODIUM CHLORIDE 0.9 % IR SOLN
Status: DC | PRN
  Administered 2015-09-14: 1000 mL

## 2015-09-14 SURGICAL SUPPLY — 39 items
BENZOIN TINCTURE PRP APPL 2/3 (GAUZE/BANDAGES/DRESSINGS) ×3 IMPLANT
BRIEF STRETCH FOR OB PAD LRG (UNDERPADS AND DIAPERS) ×3 IMPLANT
CANISTER SUCTION 2500CC (MISCELLANEOUS) ×3 IMPLANT
COVER SURGICAL LIGHT HANDLE (MISCELLANEOUS) ×3 IMPLANT
DRAPE IMP U-DRAPE 54X76 (DRAPES) ×3 IMPLANT
DRAPE LAPAROSCOPIC ABDOMINAL (DRAPES) ×3 IMPLANT
DRAPE PROXIMA HALF (DRAPES) IMPLANT
DRSG CUTIMED SORBACT 7X9 (GAUZE/BANDAGES/DRESSINGS) ×6 IMPLANT
DRSG PAD ABDOMINAL 8X10 ST (GAUZE/BANDAGES/DRESSINGS) ×12 IMPLANT
ELECT CAUTERY BLADE 6.4 (BLADE) ×3 IMPLANT
ELECT REM PT RETURN 9FT ADLT (ELECTROSURGICAL) ×3
ELECTRODE REM PT RTRN 9FT ADLT (ELECTROSURGICAL) ×1 IMPLANT
GAUZE SPONGE 4X4 12PLY STRL (GAUZE/BANDAGES/DRESSINGS) ×6 IMPLANT
GLOVE BIO SURGEON STRL SZ 6.5 (GLOVE) ×4 IMPLANT
GLOVE BIO SURGEONS STRL SZ 6.5 (GLOVE) ×2
GOWN STRL REUS W/ TWL LRG LVL3 (GOWN DISPOSABLE) ×3 IMPLANT
GOWN STRL REUS W/TWL LRG LVL3 (GOWN DISPOSABLE) ×6
KIT BASIN OR (CUSTOM PROCEDURE TRAY) ×3 IMPLANT
KIT ROOM TURNOVER OR (KITS) ×3 IMPLANT
MARKER SKIN DUAL TIP RULER LAB (MISCELLANEOUS) ×3 IMPLANT
MATRIX SURGICAL PSM 10X15CM (Tissue) ×9 IMPLANT
MATRIX SURGICAL PSM 7X10CM (Tissue) ×3 IMPLANT
MICROMATRIX 1000MG (Tissue) ×12 IMPLANT
MICROMATRIX 500MG (Tissue) ×3 IMPLANT
NS IRRIG 1000ML POUR BTL (IV SOLUTION) ×3 IMPLANT
PACK GENERAL/GYN (CUSTOM PROCEDURE TRAY) ×3 IMPLANT
PACK UNIVERSAL I (CUSTOM PROCEDURE TRAY) ×3 IMPLANT
PAD ARMBOARD 7.5X6 YLW CONV (MISCELLANEOUS) ×6 IMPLANT
SLEEVE SURGEON STRL (DRAPES) ×3 IMPLANT
SOL PREP POV-IOD 4OZ 10% (MISCELLANEOUS) ×3 IMPLANT
SOLUTION PARTIC MCRMTRX 1000MG (Tissue) ×4 IMPLANT
SOLUTION PARTIC MCRMTRX 500MG (Tissue) ×1 IMPLANT
SPONGE GAUZE 4X4 12PLY STER LF (GAUZE/BANDAGES/DRESSINGS) ×3 IMPLANT
STAPLER VISISTAT 35W (STAPLE) ×3 IMPLANT
SURGILUBE 2OZ TUBE FLIPTOP (MISCELLANEOUS) ×9 IMPLANT
SUT SILK 4 0 PS 2 (SUTURE) ×12 IMPLANT
SUT VIC AB 5-0 PS2 18 (SUTURE) ×33 IMPLANT
TOWEL OR 17X26 10 PK STRL BLUE (TOWEL DISPOSABLE) ×3 IMPLANT
UNDERPAD 30X30 INCONTINENT (UNDERPADS AND DIAPERS) ×3 IMPLANT

## 2015-09-14 NOTE — Brief Op Note (Addendum)
09/08/2015 - 09/14/2015  2:38 PM  PATIENT:  Rodney [name]  65 y.o. male  PRE-OPERATIVE DIAGNOSIS:  SCROTAL WOUND  POST-OPERATIVE DIAGNOSIS:  * No post-op diagnosis entered *  PROCEDURE:  Procedure(s): IRRIGATION AND DEBRIDEMENT SCROTAL WOUND WITH PLACEMENT OF ACELL, POSSIBLE VAC (N/A)  SURGEON:  Surgeon(s) and Role:    * Claire S Dillingham, DO - Primary  ASSISTANTS: none   ANESTHESIA:   general  EBL:     BLOOD ADMINISTERED:none  DRAINS: none   LOCAL MEDICATIONS USED:  NONE  SPECIMEN:  No Specimen  DISPOSITION OF SPECIMEN:  N/A  COUNTS:  YES  TOURNIQUET:  * No tourniquets in log *  DICTATION: .Dragon Dictation  PLAN OF CARE: Return to [facility]  PATIENT DISPOSITION:  PACU - hemodynamically stable.   Delay start of Pharmacological VTE agent (>24hrs) due to surgical blood loss or risk of bleeding: no

## 2015-09-14 NOTE — Op Note (Addendum)
Operative Note   DATE OF OPERATION: 09/14/2015  LOCATION: [place]  SURGICAL DIVISION: Plastic Surgery  PREOPERATIVE DIAGNOSES:  Fournier's Gangrene 25 x 25 x 2 cm  POSTOPERATIVE DIAGNOSES:  same  PROCEDURE:  Preparation of groin, penis, scrotum bilateral and lower abdominal wound for placement of Acell (powder 4.5 gm and Sheet 10 x 15 cm Three and 7 x 10 cm).  SURGEON: Kaled Allende Sanger Tajia Szeliga, DO  ANESTHESIA:  General.   COMPLICATIONS: None.   INDICATIONS FOR PROCEDURE:  The patient, Rodney [name] is a 65 y.o. male born on 23-Jul-1950, is here for treatment of a large groin and lower abdominal wound after necrotizing fasciitis. MRN: [id number]  CONSENT:  Informed consent was obtained directly from the patient. Risks, benefits and alternatives were fully discussed. Specific risks including but not limited to bleeding, infection, hematoma, seroma, scarring, pain, infection, contracture, asymmetry, wound healing problems, and need for further surgery were all discussed. The patient did have an ample opportunity to have questions answered to satisfaction.   DESCRIPTION OF PROCEDURE:  The patient was taken to the operating room. SCDs were placed and IV antibiotics were given. The patient's operative site was prepped and draped in a sterile fashion. A time out was performed and all information was confirmed to be correct.  General anesthesia was administered.  The area was irrigated with antibiotic solution and saline.  Debridement was done of the soft tissue with scissors of the entire area 25 x 25 cm.  The Acell powder was applied to the groin (2.5 gm), penis and scrotum(2 gm).  The Acell sheet 10 x 15 cm was applied and secured with 5-0 Vicryl.  The Acell sheet was wrapped around the penis 7 x 10 cm and secured with 5-0 Vicryl.  The Acell sheet was applied to the lower and lateral groin area 10 x 15 cm and secured with 5-0 vicryl.  The Sorbact was applied to the  entire area and secured with 4-0 Silk and Vicryl.  The KY gel was applied and 4 x 4 gauze and ABD.  The wound was too close to the rectum to be able to apply a VAC sponge and get a seal. The patient tolerated the procedure well.  There were no complications. The patient was allowed to wake from anesthesia, extubated and taken to the recovery room in satisfactory condition.

## 2015-09-14 NOTE — H&P (View-Only) (Signed)
Reason for Consult:Fournier gangrene Referring Physician: Debbe Odea, MD  Rodney [name] is an 65 y.o. male.  HPI: The patient is a 65 yrs old wm here for rehabilitation.  He was admitted to [facility] one week ago with complaints of groin pain and swelling.  He states today that he knew something was wrong and had pain for two weeks prior to presentation.  By the time he got the hospital he has an infection and was diagnosed with necrotizing fasciitis / fournier gangrene.  He was taken to the OR by urology for debridement and excision.  He is now at [facility] for rehab.  The area involves the full lower abdominal area, groin and scrotal area.  He is undergoing painful dressing changes 1 - 2 times a day.  He is not eating well.  Vitamins were started.  He has multiple medical conditions.  He was radiated for prostate cancer ~ 6 yrs ago.  Past Medical History  Diagnosis Date  . Prostate cancer ([place]) 05/03/2009    Prostatectomy/Adenocrcinoma  . Heart murmur     history  . Cataract     left eye surgery/   . Radiation 07/02/11-08/22/11    Prostate fossa 68.4 gray 38 fractions  . Cellulitis of scrotum   . Hyponatremia   . Bullous pemphigoid   . Peripheral vascular disease ([place])   . AKI (acute kidney injury) ([place])   . Thrombocytopenia ([place])   . CAD (coronary artery disease)   . Severe malnutrition ([place])   . Alcohol dependence ([place])   . Benign essential HTN 09/01/2015    Past Surgical History  Procedure Laterality Date  . Left eye surgery    . Hemorrhoid surgery    . Robot assisted laparoscopic radical prostatectomy  05/03/2009  . Wrist surgery      left  . Left heart catheterization with coronary angiogram N/A 02/25/2012    Procedure: LEFT HEART CATHETERIZATION WITH CORONARY ANGIOGRAM;  Surgeon: Clent Demark, MD;  Location: [facility] CATH LAB;  Service: Cardiovascular;  Laterality: N/A;  . Peripheral vascular catheterization N/A 02/10/2015    Procedure: Abdominal Aortogram;  Surgeon: Elam Dutch, MD;  Location: [place] CV LAB;  Service: Cardiovascular;  Laterality: N/A;  . Peripheral vascular catheterization Bilateral 02/10/2015    Procedure: Lower Extremity Angiography;  Surgeon: Elam Dutch, MD;  Location: [place] CV LAB;  Service: Cardiovascular;  Laterality: Bilateral;  . Endarterectomy femoral Left 02/17/2015    Procedure: LEFT FEMORAl ENDARTERECTOMY ;  Surgeon: Angelia Mould, MD;  Location: [place];  Service: Vascular;  Laterality: Left;  . Patch angioplasty Left 02/17/2015    Procedure: VEIN PATCH ANGIOPLASTY;  Surgeon: Angelia Mould, MD;  Location: [place];  Service: Vascular;  Laterality: Left;  . Cardiac catheterization N/A 08/04/2015    Procedure: Left Heart Cath and Coronary Angiography;  Surgeon: Charolette Forward, MD;  Location: [place] CV LAB;  Service: Cardiovascular;  Laterality: N/A;  . Irrigation and debridement abscess N/A 08/31/2015    Procedure: IRRIGATION AND DEBRIDEMENT SCROTAL  ABSCESS;  Surgeon: Kathie Rhodes, MD;  Location: [facility];  Service: Urology;  Laterality: N/A;  . Irrigation and debridement abscess N/A 08/31/2015    Procedure: IRRIGATION AND DEBRIDEMENT ABSCESS;  Surgeon: Rolm Bookbinder, MD;  Location: [facility];  Service: General;  Laterality: N/A;  . Irrigation and debridement abscess N/A 09/02/2015    Procedure: IRRIGATION AND DEBRIDEMENT OF SCROTUM, ABDOMEN AND PERINEUM  WITH DRESSING CHANGE;  Surgeon: Nickie Retort,  MD;  Location: [facility];  Service: Urology;  Laterality: N/A;  . Irrigation and debridement abscess N/A 09/04/2015    Procedure: IRRIGATION AND DEBRIDEMENT and dressing change ofABSCESS;  Surgeon: Irine Seal, MD;  Location: [facility];  Service: Urology;  Laterality: N/A;  to penis and scrotum    Family History  Problem Relation Age of Onset  . Uterine cancer Mother 10    going to [employer]  . Breast cancer Sister     Social History:  reports that he quit smoking about 9 months ago. His smoking use included  Cigarettes. He has a 72 pack-year smoking history. He has never used smokeless tobacco. He reports that he drinks about 3.6 oz of alcohol per week. He reports that he does not use illicit drugs.  Allergies:  Allergies  Allergen Reactions  . Other Rash    polyester and metals except titanium.    Medications: I have reviewed the patient's current medications.  Results for orders placed or performed during the hospital encounter of 09/08/15 (from the past 48 hour(s))  CBC     Status: Abnormal   Collection Time: 09/12/15  7:05 AM  Result Value Ref Range   WBC 11.2 (H) 4.0 - 10.5 K/uL   RBC 3.15 (L) 4.22 - 5.81 MIL/uL   Hemoglobin 9.4 (L) 13.0 - 17.0 g/dL   HCT 30.2 (L) 39.0 - 52.0 %   MCV 95.9 78.0 - 100.0 fL   MCH 29.8 26.0 - 34.0 pg   MCHC 31.1 30.0 - 36.0 g/dL   RDW 16.7 (H) 11.5 - 15.5 %   Platelets 357 150 - 400 K/uL  Basic metabolic panel     Status: Abnormal   Collection Time: 09/12/15  7:05 AM  Result Value Ref Range   Sodium 138 135 - 145 mmol/L   Potassium 4.0 3.5 - 5.1 mmol/L   Chloride 103 101 - 111 mmol/L   CO2 27 22 - 32 mmol/L   Glucose, Bld 84 65 - 99 mg/dL   BUN 10 6 - 20 mg/dL   Creatinine, Ser 0.63 0.61 - 1.24 mg/dL   Calcium 8.5 (L) 8.9 - 10.3 mg/dL   GFR calc non Af Amer >60 >60 mL/min   GFR calc Af Amer >60 >60 mL/min    Comment: (NOTE) The eGFR has been calculated using the CKD EPI equation. This calculation has not been validated in all clinical situations. eGFR's persistently <60 mL/min signify possible Chronic Kidney Disease.    Anion gap 8 5 - 15    No results found.  Review of Systems  Constitutional: Positive for fever, chills and malaise/fatigue.  HENT: Negative.   Eyes: Negative.   Respiratory: Negative.   Cardiovascular: Negative.   Gastrointestinal: Negative.   Genitourinary: Negative.   Musculoskeletal: Negative.   Skin: Negative.   Neurological: Positive for weakness.  Psychiatric/Behavioral: Negative.    There were no vitals  taken for this visit. Physical Exam  Constitutional: He is oriented to person, place, and time. He appears well-developed.  HENT:  Head: Normocephalic and atraumatic.  Eyes: Pupils are equal, round, and reactive to light.  Respiratory: Effort normal. No respiratory distress. He has no wheezes.  GI: Soft. He exhibits no distension. There is tenderness.    Neurological: He is alert and oriented to person, place, and time.  Skin: Skin is warm.  Psychiatric: He has a normal mood and affect. His behavior is normal. Judgment and thought content normal.    Assessment/Plan: Plan for OR for  debridement with Acell and possible VAC placement this week.  [name] 09/13/2015, 3:13 PM

## 2015-09-14 NOTE — Anesthesia Procedure Notes (Signed)
Procedure Name: LMA Insertion Date/Time: 09/14/2015 2:42 PM Performed by: Raphael Gibney T Pre-anesthesia Checklist: Patient identified, Timeout performed, Emergency Drugs available, Suction available and Patient being monitored Patient Re-evaluated:Patient Re-evaluated prior to inductionOxygen Delivery Method: Circle system utilized and Simple face mask Preoxygenation: Pre-oxygenation with 100% oxygen Intubation Type: IV induction Ventilation: Mask ventilation without difficulty LMA: LMA inserted LMA Size: 4.0 Number of attempts: 1 Airway Equipment and Method: Patient positioned with wedge pillow Placement Confirmation: positive ETCO2 and breath sounds checked- equal and bilateral Tube secured with: Tape Dental Injury: Teeth and Oropharynx as per pre-operative assessment

## 2015-09-14 NOTE — Interval H&P Note (Signed)
History and Physical Interval Note:  09/14/2015 2:24 PM  Rodney [name]  has presented today for surgery, with the diagnosis of SCROTAL WOUND  The various methods of treatment have been discussed with the patient and family. After consideration of risks, benefits and other options for treatment, the patient has consented to  Procedure(s): IRRIGATION AND [place] (N/A) as a surgical intervention .  The patient's history has been reviewed, patient examined, no change in status, stable for surgery.  I have reviewed the patient's chart and labs.  Questions were answered to the patient's satisfaction.     [name]

## 2015-09-14 NOTE — Transfer of Care (Signed)
Immediate Anesthesia Transfer of Care Note  Patient: Rodney [name]  Procedure(s) Performed: Procedure(s) with comments: IRRIGATION AND DEBRIDEMENT SCROTAL WOUND WITH PLACEMENT OF ACELL (N/A) - Including Groin  Patient Location: PACU  Anesthesia Type:General  Level of Consciousness: awake, alert  and oriented  Airway & Oxygen Therapy: Patient Spontanous Breathing and Patient connected to nasal cannula oxygen  Post-op Assessment: Report given to RN and Post -op Vital signs reviewed and stable  Post vital signs: Reviewed and stable  Last Vitals: There were no vitals filed for this visit.  Complications: No apparent anesthesia complications

## 2015-09-14 NOTE — Anesthesia Postprocedure Evaluation (Signed)
Anesthesia Post Note  Patient: Rodney [name]  Procedure(s) Performed: Procedure(s) (LRB): IRRIGATION AND DEBRIDEMENT SCROTAL WOUND WITH PLACEMENT OF ACELL (N/A)  Patient location during evaluation: PACU Anesthesia Type: General Level of consciousness: awake Pain management: pain level controlled Vital Signs Assessment: post-procedure vital signs reviewed and stable Respiratory status: spontaneous breathing Cardiovascular status: stable Anesthetic complications: no    Last Vitals:  Filed Vitals:   09/14/15 1630 09/14/15 1635  BP:  118/67  Pulse: 79 79  Temp:  36.6 C  Resp: 19 14    Last Pain: There were no vitals filed for this visit.               EDWARDS,Kenderick Kobler

## 2015-09-14 NOTE — Anesthesia Preprocedure Evaluation (Signed)
Anesthesia Evaluation  Patient identified by MRN, date of birth, ID band Patient awake    Reviewed: Allergy & Precautions, NPO status , Patient's Chart, lab work & pertinent test results  Airway Mallampati: II  TM Distance: >3 FB Neck ROM: Full    Dental   Pulmonary former smoker,    breath sounds clear to auscultation       Cardiovascular hypertension, + CAD and + Peripheral Vascular Disease   Rhythm:Regular Rate:Normal     Neuro/Psych    GI/Hepatic negative GI ROS, Neg liver ROS,   Endo/Other    Renal/GU Renal disease     Musculoskeletal   Abdominal   Peds  Hematology   Anesthesia Other Findings   Reproductive/Obstetrics                             Anesthesia Physical Anesthesia Plan  ASA: III  Anesthesia Plan: General   Post-op Pain Management:    Induction: Intravenous  Airway Management Planned: Oral ETT and LMA  Additional Equipment:   Intra-op Plan:   Post-operative Plan: Extubation in OR  Informed Consent: I have reviewed the patients History and Physical, chart, labs and discussed the procedure including the risks, benefits and alternatives for the proposed anesthesia with the patient or authorized representative who has indicated his/her understanding and acceptance.   Dental advisory given  Plan Discussed with: CRNA and Anesthesiologist  Anesthesia Plan Comments:         Anesthesia Quick Evaluation

## 2015-09-15 LAB — CBC
HEMATOCRIT: 31.5 % — AB (ref 39.0–52.0)
HEMOGLOBIN: 9.6 g/dL — AB (ref 13.0–17.0)
MCH: 29.4 pg (ref 26.0–34.0)
MCHC: 30.5 g/dL (ref 30.0–36.0)
MCV: 96.3 fL (ref 78.0–100.0)
Platelets: 422 10*3/uL — ABNORMAL HIGH (ref 150–400)
RBC: 3.27 MIL/uL — ABNORMAL LOW (ref 4.22–5.81)
RDW: 16.6 % — ABNORMAL HIGH (ref 11.5–15.5)
WBC: 12.8 10*3/uL — AB (ref 4.0–10.5)

## 2015-09-15 LAB — COMPREHENSIVE METABOLIC PANEL
ALBUMIN: 1.8 g/dL — AB (ref 3.5–5.0)
ALK PHOS: 155 U/L — AB (ref 38–126)
ALT: 64 U/L — AB (ref 17–63)
AST: 71 U/L — AB (ref 15–41)
Anion gap: 10 (ref 5–15)
BILIRUBIN TOTAL: 0.3 mg/dL (ref 0.3–1.2)
BUN: 6 mg/dL (ref 6–20)
CO2: 24 mmol/L (ref 22–32)
CREATININE: 0.9 mg/dL (ref 0.61–1.24)
Calcium: 8.6 mg/dL — ABNORMAL LOW (ref 8.9–10.3)
Chloride: 100 mmol/L — ABNORMAL LOW (ref 101–111)
GFR calc Af Amer: >60 mL/min (ref >60)
GFR calc non Af Amer: >60 mL/min (ref >60)
GLUCOSE: 124 mg/dL — AB (ref 65–99)
Potassium: 5.1 mmol/L (ref 3.5–5.1)
Sodium: 134 mmol/L — ABNORMAL LOW (ref 135–145)
TOTAL PROTEIN: 5 g/dL — AB (ref 6.5–8.1)

## 2015-09-17 LAB — CBC WITH DIFFERENTIAL/PLATELET
Basophils Absolute: 0 10*3/uL (ref 0.0–0.1)
Basophils Relative: 1 %
EOS PCT: 3 %
Eosinophils Absolute: 0.3 10*3/uL (ref 0.0–0.7)
HEMATOCRIT: 30.1 % — AB (ref 39.0–52.0)
Hemoglobin: 9.3 g/dL — ABNORMAL LOW (ref 13.0–17.0)
LYMPHS ABS: 0.9 10*3/uL (ref 0.7–4.0)
LYMPHS PCT: 11 %
MCH: 29.2 pg (ref 26.0–34.0)
MCHC: 30.9 g/dL (ref 30.0–36.0)
MCV: 94.7 fL (ref 78.0–100.0)
Monocytes Absolute: 1.1 10*3/uL — ABNORMAL HIGH (ref 0.1–1.0)
Monocytes Relative: 13 %
NEUTROS ABS: 6.3 10*3/uL (ref 1.7–7.7)
Neutrophils Relative %: 72 %
PLATELETS: 396 10*3/uL (ref 150–400)
RBC: 3.18 MIL/uL — AB (ref 4.22–5.81)
RDW: 16 % — ABNORMAL HIGH (ref 11.5–15.5)
WBC: 8.6 10*3/uL (ref 4.0–10.5)

## 2015-09-17 LAB — BASIC METABOLIC PANEL
ANION GAP: 10 (ref 5–15)
BUN: 12 mg/dL (ref 6–20)
CHLORIDE: 94 mmol/L — AB (ref 101–111)
CO2: 30 mmol/L (ref 22–32)
Calcium: 8.9 mg/dL (ref 8.9–10.3)
Creatinine, Ser: 0.82 mg/dL (ref 0.61–1.24)
GFR calc Af Amer: >60 mL/min (ref >60)
GLUCOSE: 124 mg/dL — AB (ref 65–99)
POTASSIUM: 3.8 mmol/L (ref 3.5–5.1)
Sodium: 134 mmol/L — ABNORMAL LOW (ref 135–145)

## 2015-09-17 LAB — HEMOGLOBIN AND HEMATOCRIT, BLOOD
HCT: 28.4 % — ABNORMAL LOW (ref 39.0–52.0)
Hemoglobin: 8.8 g/dL — ABNORMAL LOW (ref 13.0–17.0)

## 2015-09-18 LAB — CBC WITH DIFFERENTIAL/PLATELET
BASOS ABS: 0 10*3/uL (ref 0.0–0.1)
BASOS PCT: 1 %
Eosinophils Absolute: 0.4 10*3/uL (ref 0.0–0.7)
Eosinophils Relative: 5 %
HEMATOCRIT: 26.8 % — AB (ref 39.0–52.0)
Hemoglobin: 8.5 g/dL — ABNORMAL LOW (ref 13.0–17.0)
Lymphocytes Relative: 10 %
Lymphs Abs: 0.7 10*3/uL (ref 0.7–4.0)
MCH: 30.2 pg (ref 26.0–34.0)
MCHC: 31.7 g/dL (ref 30.0–36.0)
MCV: 95.4 fL (ref 78.0–100.0)
MONO ABS: 1.1 10*3/uL — AB (ref 0.1–1.0)
Monocytes Relative: 15 %
NEUTROS ABS: 4.9 10*3/uL (ref 1.7–7.7)
Neutrophils Relative %: 69 %
PLATELETS: 381 10*3/uL (ref 150–400)
RBC: 2.81 MIL/uL — ABNORMAL LOW (ref 4.22–5.81)
RDW: 15.9 % — AB (ref 11.5–15.5)
WBC: 7.1 10*3/uL (ref 4.0–10.5)

## 2015-09-18 LAB — BASIC METABOLIC PANEL
ANION GAP: 9 (ref 5–15)
BUN: 13 mg/dL (ref 6–20)
CHLORIDE: 98 mmol/L — AB (ref 101–111)
CO2: 30 mmol/L (ref 22–32)
CREATININE: 0.79 mg/dL (ref 0.61–1.24)
Calcium: 9.1 mg/dL (ref 8.9–10.3)
GFR calc non Af Amer: >60 mL/min (ref >60)
Glucose, Bld: 106 mg/dL — ABNORMAL HIGH (ref 65–99)
POTASSIUM: 3.5 mmol/L (ref 3.5–5.1)
SODIUM: 137 mmol/L (ref 135–145)

## 2015-09-18 LAB — PSA: PSA: 0.01 ng/mL (ref 0.00–4.00)

## 2015-09-19 ENCOUNTER — Encounter (HOSPITAL_COMMUNITY): Payer: Self-pay | Admitting: Plastic Surgery

## 2015-09-19 LAB — PHOSPHORUS: PHOSPHORUS: 4.1 mg/dL (ref 2.5–4.6)

## 2015-09-19 LAB — URINE CULTURE

## 2015-09-19 LAB — BASIC METABOLIC PANEL
ANION GAP: 8 (ref 5–15)
BUN: 19 mg/dL (ref 6–20)
CO2: 29 mmol/L (ref 22–32)
Calcium: 9.1 mg/dL (ref 8.9–10.3)
Chloride: 104 mmol/L (ref 101–111)
Creatinine, Ser: 0.66 mg/dL (ref 0.61–1.24)
GLUCOSE: 101 mg/dL — AB (ref 65–99)
POTASSIUM: 4 mmol/L (ref 3.5–5.1)
Sodium: 141 mmol/L (ref 135–145)

## 2015-09-19 LAB — CBC
HEMATOCRIT: 27.1 % — AB (ref 39.0–52.0)
Hemoglobin: 8.1 g/dL — ABNORMAL LOW (ref 13.0–17.0)
MCH: 28.9 pg (ref 26.0–34.0)
MCHC: 29.9 g/dL — ABNORMAL LOW (ref 30.0–36.0)
MCV: 96.8 fL (ref 78.0–100.0)
PLATELETS: 419 10*3/uL — AB (ref 150–400)
RBC: 2.8 MIL/uL — AB (ref 4.22–5.81)
RDW: 16 % — ABNORMAL HIGH (ref 11.5–15.5)
WBC: 8 10*3/uL (ref 4.0–10.5)

## 2015-09-19 LAB — MAGNESIUM: Magnesium: 1.7 mg/dL (ref 1.7–2.4)

## 2015-09-21 LAB — CBC WITH DIFFERENTIAL/PLATELET
BASOS ABS: 0.1 10*3/uL (ref 0.0–0.1)
BASOS PCT: 1 %
EOS ABS: 0.6 10*3/uL (ref 0.0–0.7)
Eosinophils Relative: 7 %
HCT: 29.8 % — ABNORMAL LOW (ref 39.0–52.0)
HEMOGLOBIN: 8.9 g/dL — AB (ref 13.0–17.0)
LYMPHS PCT: 10 %
Lymphs Abs: 0.9 10*3/uL (ref 0.7–4.0)
MCH: 29.1 pg (ref 26.0–34.0)
MCHC: 29.9 g/dL — ABNORMAL LOW (ref 30.0–36.0)
MCV: 97.4 fL (ref 78.0–100.0)
Monocytes Absolute: 1 10*3/uL (ref 0.1–1.0)
Monocytes Relative: 11 %
NEUTROS ABS: 6.4 10*3/uL (ref 1.7–7.7)
NEUTROS PCT: 71 %
PLATELETS: 459 10*3/uL — AB (ref 150–400)
RBC: 3.06 MIL/uL — ABNORMAL LOW (ref 4.22–5.81)
RDW: 16 % — ABNORMAL HIGH (ref 11.5–15.5)
WBC: 9 10*3/uL (ref 4.0–10.5)

## 2015-09-21 LAB — BASIC METABOLIC PANEL
ANION GAP: 12 (ref 5–15)
BUN: 14 mg/dL (ref 6–20)
CHLORIDE: 103 mmol/L (ref 101–111)
CO2: 28 mmol/L (ref 22–32)
Calcium: 9.1 mg/dL (ref 8.9–10.3)
Creatinine, Ser: 0.68 mg/dL (ref 0.61–1.24)
GFR calc Af Amer: >60 mL/min (ref >60)
Glucose, Bld: 95 mg/dL (ref 65–99)
POTASSIUM: 3.9 mmol/L (ref 3.5–5.1)
SODIUM: 143 mmol/L (ref 135–145)

## 2015-09-22 ENCOUNTER — Other Ambulatory Visit: Payer: Self-pay | Admitting: Plastic Surgery

## 2015-09-22 DIAGNOSIS — N493 Fournier gangrene: Secondary | ICD-10-CM

## 2015-09-24 LAB — CBC WITH DIFFERENTIAL/PLATELET
BASOS ABS: 0.2 10*3/uL — AB (ref 0.0–0.1)
Basophils Relative: 2 %
EOS ABS: 1 10*3/uL — AB (ref 0.0–0.7)
Eosinophils Relative: 9 %
HCT: 28.6 % — ABNORMAL LOW (ref 39.0–52.0)
Hemoglobin: 8.6 g/dL — ABNORMAL LOW (ref 13.0–17.0)
Lymphocytes Relative: 10 %
Lymphs Abs: 1.1 10*3/uL (ref 0.7–4.0)
MCH: 28.7 pg (ref 26.0–34.0)
MCHC: 30.1 g/dL (ref 30.0–36.0)
MCV: 95.3 fL (ref 78.0–100.0)
Monocytes Absolute: 1.1 10*3/uL — ABNORMAL HIGH (ref 0.1–1.0)
Monocytes Relative: 10 %
NEUTROS ABS: 7.6 10*3/uL (ref 1.7–7.7)
Neutrophils Relative %: 69 %
PLATELETS: 456 10*3/uL — AB (ref 150–400)
RBC: 3 MIL/uL — AB (ref 4.22–5.81)
RDW: 15.9 % — ABNORMAL HIGH (ref 11.5–15.5)
WBC: 11 10*3/uL — AB (ref 4.0–10.5)

## 2015-09-24 LAB — BASIC METABOLIC PANEL
ANION GAP: 5 (ref 5–15)
BUN: 19 mg/dL (ref 6–20)
CO2: 31 mmol/L (ref 22–32)
Calcium: 9 mg/dL (ref 8.9–10.3)
Chloride: 105 mmol/L (ref 101–111)
Creatinine, Ser: 0.58 mg/dL — ABNORMAL LOW (ref 0.61–1.24)
GLUCOSE: 95 mg/dL (ref 65–99)
POTASSIUM: 4.1 mmol/L (ref 3.5–5.1)
SODIUM: 141 mmol/L (ref 135–145)

## 2015-09-24 LAB — MAGNESIUM: Magnesium: 1.8 mg/dL (ref 1.7–2.4)

## 2015-09-24 LAB — PHOSPHORUS: Phosphorus: 3.9 mg/dL (ref 2.5–4.6)

## 2015-09-25 ENCOUNTER — Encounter (HOSPITAL_COMMUNITY): Payer: Self-pay | Admitting: Certified Registered"

## 2015-09-25 ENCOUNTER — Encounter (HOSPITAL_COMMUNITY)
Admission: AD | Disposition: A | Discharge status: Skilled Nursing Facility | Payer: Self-pay | Source: Ambulatory Visit | Attending: Plastic Surgery

## 2015-09-25 ENCOUNTER — Other Ambulatory Visit: Payer: Self-pay | Admitting: Plastic Surgery

## 2015-09-25 ENCOUNTER — Encounter: Payer: Self-pay | Admitting: Certified Registered"

## 2015-09-25 HISTORY — PX: INCISION AND DRAINAGE OF WOUND: SHX1803

## 2015-09-25 HISTORY — PX: APPLICATION OF A-CELL OF EXTREMITY: SHX6303

## 2015-09-25 SURGERY — IRRIGATION AND DEBRIDEMENT WOUND
Anesthesia: General | Site: Groin

## 2015-09-25 MED ORDER — PHENYLEPHRINE HCL 10 MG/ML IJ SOLN
INTRAMUSCULAR | Status: AC
  Filled 2015-09-25: qty 1

## 2015-09-25 MED ORDER — FENTANYL CITRATE (PF) 250 MCG/5ML IJ SOLN
INTRAMUSCULAR | Status: AC
  Filled 2015-09-25: qty 5

## 2015-09-25 MED ORDER — ONDANSETRON HCL 4 MG/2ML IJ SOLN
INTRAMUSCULAR | Status: AC
  Filled 2015-09-25: qty 2

## 2015-09-25 MED ORDER — CEFAZOLIN SODIUM-DEXTROSE 2-4 GM/100ML-% IV SOLN
INTRAVENOUS | Status: AC
  Filled 2015-09-25: qty 100

## 2015-09-25 MED ORDER — LIDOCAINE HCL (CARDIAC) 20 MG/ML IV SOLN
INTRAVENOUS | Status: DC | PRN
  Administered 2015-09-25: 50 mg via INTRAVENOUS

## 2015-09-25 MED ORDER — MIDAZOLAM HCL 5 MG/5ML IJ SOLN
INTRAMUSCULAR | Status: DC | PRN
  Administered 2015-09-25: 2 mg via INTRAVENOUS

## 2015-09-25 MED ORDER — PROPOFOL 10 MG/ML IV BOLUS
INTRAVENOUS | Status: DC | PRN
  Administered 2015-09-25: 130 mg via INTRAVENOUS

## 2015-09-25 MED ORDER — ONDANSETRON HCL 4 MG/2ML IJ SOLN
INTRAMUSCULAR | Status: DC | PRN
  Administered 2015-09-25 (×2): 4 mg via INTRAVENOUS

## 2015-09-25 MED ORDER — MIDAZOLAM HCL 2 MG/2ML IJ SOLN
INTRAMUSCULAR | Status: AC
  Filled 2015-09-25: qty 2

## 2015-09-25 MED ORDER — 0.9 % SODIUM CHLORIDE (POUR BTL) OPTIME
TOPICAL | Status: DC | PRN
  Administered 2015-09-25: 1000 mL

## 2015-09-25 MED ORDER — PHENYLEPHRINE HCL 10 MG/ML IJ SOLN
INTRAMUSCULAR | Status: DC | PRN
  Administered 2015-09-25: 120 ug via INTRAVENOUS
  Administered 2015-09-25: 80 ug via INTRAVENOUS
  Administered 2015-09-25: 120 ug via INTRAVENOUS
  Administered 2015-09-25: 80 ug via INTRAVENOUS

## 2015-09-25 MED ORDER — PHENYLEPHRINE 40 MCG/ML (10ML) SYRINGE FOR IV PUSH (FOR BLOOD PRESSURE SUPPORT)
PREFILLED_SYRINGE | INTRAVENOUS | Status: AC
  Filled 2015-09-25: qty 10

## 2015-09-25 MED ORDER — FENTANYL CITRATE (PF) 100 MCG/2ML IJ SOLN
INTRAMUSCULAR | Status: DC | PRN
Start: 2015-09-25 — End: 2015-09-25
  Administered 2015-09-25: 50 ug via INTRAVENOUS
  Administered 2015-09-25: 25 ug via INTRAVENOUS

## 2015-09-25 MED ORDER — PHENYLEPHRINE HCL 10 MG/ML IJ SOLN
10.0000 mg | INTRAVENOUS | Status: DC | PRN
  Administered 2015-09-25: 100 ug/min via INTRAVENOUS

## 2015-09-25 MED ORDER — CEFAZOLIN SODIUM-DEXTROSE 2-4 GM/100ML-% IV SOLN
2.0000 g | INTRAVENOUS | Status: AC
  Administered 2015-09-25: 2 g via INTRAVENOUS

## 2015-09-25 MED ORDER — SODIUM CHLORIDE 0.9 % IR SOLN
Status: DC | PRN
  Administered 2015-09-25: 500 mL

## 2015-09-25 MED ORDER — LACTATED RINGERS IV SOLN
INTRAVENOUS | Status: DC
  Administered 2015-09-25 – 2015-10-05 (×2): via INTRAVENOUS

## 2015-09-25 SURGICAL SUPPLY — 61 items
BAG DECANTER FOR FLEXI CONT (MISCELLANEOUS) IMPLANT
BENZOIN TINCTURE PRP APPL 2/3 (GAUZE/BANDAGES/DRESSINGS) IMPLANT
CANISTER SUCTION 2500CC (MISCELLANEOUS) ×3 IMPLANT
CANISTER WOUND CARE 500ML ATS (WOUND CARE) IMPLANT
CONT SPEC STER OR (MISCELLANEOUS) IMPLANT
COVER SURGICAL LIGHT HANDLE (MISCELLANEOUS) ×3 IMPLANT
DECANTER SPIKE VIAL GLASS SM (MISCELLANEOUS) ×3 IMPLANT
DRAPE IMP U-DRAPE 54X76 (DRAPES) IMPLANT
DRAPE INCISE IOBAN 66X45 STRL (DRAPES) IMPLANT
DRAPE LAPAROSCOPIC ABDOMINAL (DRAPES) ×3 IMPLANT
DRAPE PED LAPAROTOMY (DRAPES) IMPLANT
DRAPE PROXIMA HALF (DRAPES) ×3 IMPLANT
DRESSING HYDROCOLLOID 4X4 (GAUZE/BANDAGES/DRESSINGS) IMPLANT
DRSG ADAPTIC 3X8 NADH LF (GAUZE/BANDAGES/DRESSINGS) IMPLANT
DRSG CUTIMED SORBACT 7X9 (GAUZE/BANDAGES/DRESSINGS) ×3 IMPLANT
DRSG PAD ABDOMINAL 8X10 ST (GAUZE/BANDAGES/DRESSINGS) ×12 IMPLANT
DRSG VAC ATS LRG SENSATRAC (GAUZE/BANDAGES/DRESSINGS) IMPLANT
DRSG VAC ATS MED SENSATRAC (GAUZE/BANDAGES/DRESSINGS) IMPLANT
DRSG VAC ATS SM SENSATRAC (GAUZE/BANDAGES/DRESSINGS) IMPLANT
ELECT CAUTERY BLADE 6.4 (BLADE) ×3 IMPLANT
ELECT REM PT RETURN 9FT ADLT (ELECTROSURGICAL) ×3
ELECTRODE REM PT RTRN 9FT ADLT (ELECTROSURGICAL) ×1 IMPLANT
GAUZE SPONGE 4X4 12PLY STRL (GAUZE/BANDAGES/DRESSINGS) IMPLANT
GEL ULTRASOUND 20GR AQUASONIC (MISCELLANEOUS) IMPLANT
GLOVE BIO SURGEON STRL SZ 6.5 (GLOVE) ×6 IMPLANT
GLOVE BIO SURGEON STRL SZ7.5 (GLOVE) ×3 IMPLANT
GLOVE BIO SURGEONS STRL SZ 6.5 (GLOVE) ×3
GLOVE BIOGEL PI IND STRL 7.0 (GLOVE) ×1 IMPLANT
GLOVE BIOGEL PI INDICATOR 7.0 (GLOVE) ×2
GLOVE SURG SS PI 7.0 STRL IVOR (GLOVE) ×3 IMPLANT
GOWN STRL REUS W/ TWL LRG LVL3 (GOWN DISPOSABLE) ×4 IMPLANT
GOWN STRL REUS W/TWL LRG LVL3 (GOWN DISPOSABLE) ×8
KIT BASIN OR (CUSTOM PROCEDURE TRAY) ×3 IMPLANT
KIT ROOM TURNOVER OR (KITS) ×3 IMPLANT
MARKER SKIN SURG 5.25 VIO NS (MISCELLANEOUS) ×3 IMPLANT
MATRIX SURGICAL PSM 10X15CM (Tissue) ×3 IMPLANT
MATRIX SURGICAL PSM 7X10CM (Tissue) ×9 IMPLANT
MICROMATRIX 1000MG (Tissue) ×3 IMPLANT
NS IRRIG 1000ML POUR BTL (IV SOLUTION) ×3 IMPLANT
PACK GENERAL/GYN (CUSTOM PROCEDURE TRAY) ×3 IMPLANT
PACK ORTHO EXTREMITY (CUSTOM PROCEDURE TRAY) ×3 IMPLANT
PACK UNIVERSAL I (CUSTOM PROCEDURE TRAY) ×3 IMPLANT
PAD ABD 8X10 STRL (GAUZE/BANDAGES/DRESSINGS) ×3 IMPLANT
PAD ARMBOARD 7.5X6 YLW CONV (MISCELLANEOUS) ×6 IMPLANT
SOL PREP POV-IOD 4OZ 10% (MISCELLANEOUS) ×6 IMPLANT
SOLUTION PARTIC MCRMTRX 1000MG (Tissue) ×1 IMPLANT
SPONGE GAUZE 4X4 12PLY STER LF (GAUZE/BANDAGES/DRESSINGS) ×3 IMPLANT
STAPLER VISISTAT 35W (STAPLE) IMPLANT
STOCKINETTE IMPERVIOUS 9X36 MD (GAUZE/BANDAGES/DRESSINGS) IMPLANT
STOCKINETTE IMPERVIOUS LG (DRAPES) IMPLANT
SURGILUBE 2OZ TUBE FLIPTOP (MISCELLANEOUS) ×15 IMPLANT
SUT SILK 4 0 P 3 (SUTURE) ×12 IMPLANT
SUT VIC AB 5-0 PS2 18 (SUTURE) ×42 IMPLANT
SWAB COLLECTION DEVICE MRSA (MISCELLANEOUS) IMPLANT
TOWEL OR 17X24 6PK STRL BLUE (TOWEL DISPOSABLE) ×6 IMPLANT
TOWEL OR 17X26 10 PK STRL BLUE (TOWEL DISPOSABLE) ×6 IMPLANT
TUBE ANAEROBIC SPECIMEN COL (MISCELLANEOUS) IMPLANT
TUBE CONNECTING 12'X1/4 (SUCTIONS) ×1
TUBE CONNECTING 12X1/4 (SUCTIONS) ×2 IMPLANT
UNDERPAD 30X30 INCONTINENT (UNDERPADS AND DIAPERS) ×3 IMPLANT
YANKAUER SUCT BULB TIP NO VENT (SUCTIONS) ×3 IMPLANT

## 2015-09-25 NOTE — Anesthesia Preprocedure Evaluation (Addendum)
Anesthesia Evaluation  Patient identified by MRN, date of birth, ID band Patient awake    Reviewed: Allergy & Precautions, NPO status , Patient's Chart, lab work & pertinent test results  Airway Mallampati: II  TM Distance: >3 FB Neck ROM: Full    Dental  (+) Teeth Intact, Dental Advisory Given   Pulmonary former smoker,    breath sounds clear to auscultation       Cardiovascular hypertension, Pt. on medications and Pt. on home beta blockers + CAD, + Past MI and + Peripheral Vascular Disease   Rhythm:regular Rate:Normal     Neuro/Psych    GI/Hepatic   Endo/Other    Renal/GU      Musculoskeletal   Abdominal   Peds  Hematology  (+) anemia ,   Anesthesia Other Findings   Reproductive/Obstetrics                            Anesthesia Physical Anesthesia Plan  ASA: III  Anesthesia Plan: General   Post-op Pain Management:    Induction: Intravenous  Airway Management Planned: LMA  Additional Equipment: None  Intra-op Plan:   Post-operative Plan:   Informed Consent: I have reviewed the patients History and Physical, chart, labs and discussed the procedure including the risks, benefits and alternatives for the proposed anesthesia with the patient or authorized representative who has indicated his/her understanding and acceptance.   Dental advisory given  Plan Discussed with: Anesthesiologist, CRNA and Surgeon  Anesthesia Plan Comments:        Anesthesia Quick Evaluation

## 2015-09-25 NOTE — Transfer of Care (Signed)
Immediate Anesthesia Transfer of Care Note  Patient: Rodney [name]  Procedure(s) Performed: Procedure(s): IRRIGATION AND DEBRIDEMENT OF SCROTUM  WOUND (N/A) A CELL  (N/A)  Patient Location: PACU  Anesthesia Type:General  Level of Consciousness: awake, alert  and oriented  Airway & Oxygen Therapy: Patient Spontanous Breathing  Post-op Assessment: Report given to RN and Post -op Vital signs reviewed and stable  Post vital signs: Reviewed and stable  Last Vitals:  Filed Vitals:   09/14/15 1630 09/14/15 1635  BP:  118/67  Pulse: 79 79  Temp:  36.6 C  Resp: 19 14    Complications: No apparent anesthesia complications

## 2015-09-25 NOTE — Op Note (Signed)
Operative Note   DATE OF OPERATION: 09/25/2015  LOCATION: [facility]   SURGICAL DIVISION: Plastic Surgery  PREOPERATIVE DIAGNOSES:  Fournier's Gangrene 25 x 25 x 1.8 cm  POSTOPERATIVE DIAGNOSES: same  PROCEDURE: Preparation of groin, penis, scrotum bilateral and lower abdominal wound for placement of Acell (powder 1 gm and Sheet 10 x 15 cm, 7 x 10 cm Three)   SURGEON: Claire Sanger Dillingham, DO  ASSISTANT: Shawn Rayburn, PA  ANESTHESIA:  General.   COMPLICATIONS: None.   INDICATIONS FOR PROCEDURE:  The patient, Rodney [name] is a 65 y.o. male born on 1950-06-27, is here for treatment fournier's gangrene.  He is currently at [facility] for rehab.  He had Acell placed ~ 1 week ago and returns for further care. MRN: [id number]  CONSENT:  Informed consent was obtained directly from the patient. Risks, benefits and alternatives were fully discussed. Specific risks including but not limited to bleeding, infection, hematoma, seroma, scarring, pain, infection, contracture, asymmetry, wound healing problems, and need for further surgery were all discussed. The patient did have an ample opportunity to have questions answered to satisfaction.   DESCRIPTION OF PROCEDURE:  The patient was taken to the operating room. SCDs were placed and IV antibiotics were given. The patient's operative site was prepped and draped in a sterile fashion. A time out was performed and all information was confirmed to be correct. General anesthesia was administered. The area was irrigated with antibiotic solution and saline. Debridement was done of the soft tissue with scissors of the entire area 25 x 25 cm. The Acell powder was applied to the groin, penis and scrotum (1 gm). The Acell sheet 10 x 15 cm was applied and secured with 5-0 Vicryl. The Acell sheet was wrapped around the penis 7 x 10 cm and secured with 5-0 Vicryl. The Acell sheet was applied to the lower and lateral  groin area 7 x 10 cm (two) and secured with 5-0 vicryl. The Sorbact was applied to the entire area and secured with 4-0 Silk and Vicryl. The KY gel was applied and 4 x 4 gauze and ABD. The patient tolerated the procedure well. There were no complications. The patient was allowed to wake from anesthesia, extubated and taken to the recovery room in satisfactory condition.

## 2015-09-25 NOTE — Anesthesia Postprocedure Evaluation (Signed)
Anesthesia Post Note  Patient: Rodney [name]  Procedure(s) Performed: Procedure(s) (LRB): IRRIGATION AND DEBRIDEMENT OF SCROTUM  WOUND (N/A) A CELL  (N/A)  Patient location during evaluation: PACU Anesthesia Type: General Level of consciousness: awake and alert and patient cooperative Pain management: pain level controlled Vital Signs Assessment: post-procedure vital signs reviewed and stable Respiratory status: spontaneous breathing and respiratory function stable Cardiovascular status: stable Anesthetic complications: no    Last Vitals:  Filed Vitals:   09/25/15 1255 09/25/15 1310  BP: 113/76 103/76  Pulse: 85 81  Temp:  36 C  Resp: 11 12    Last Pain:  Filed Vitals:   09/25/15 1312  PainSc: [place]

## 2015-09-25 NOTE — Anesthesia Procedure Notes (Signed)
Procedure Name: LMA Insertion Date/Time: 09/25/2015 10:39 AM Performed by: Melina Copa, Laquilla Dault R Pre-anesthesia Checklist: Patient identified, Emergency Drugs available, Suction available and Patient being monitored Patient Re-evaluated:Patient Re-evaluated prior to inductionOxygen Delivery Method: Circle System Utilized Preoxygenation: Pre-oxygenation with 100% oxygen Intubation Type: IV induction Ventilation: Mask ventilation without difficulty LMA: LMA inserted LMA Size: 5.0 Number of attempts: 1 Placement Confirmation: positive ETCO2 Tube secured with: Tape Dental Injury: Teeth and Oropharynx as per pre-operative assessment

## 2015-09-25 NOTE — Interval H&P Note (Signed)
History and Physical Interval Note:  09/25/2015 10:14 AM  Rodney [name]  has presented today for surgery, with the diagnosis of SCROTUM WOUND  The various methods of treatment have been discussed with the patient and family. After consideration of risks, benefits and other options for treatment, the patient has consented to  Procedure(s): IRRIGATION AND DEBRIDEMENT OF SCROTUM  WOUND (N/A) A CELL AND VAC (N/A) as a surgical intervention .  The patient's history has been reviewed, patient examined, no change in status, stable for surgery.  I have reviewed the patient's chart and labs.  Questions were answered to the patient's satisfaction.     [name]

## 2015-09-26 ENCOUNTER — Encounter (HOSPITAL_COMMUNITY): Payer: Self-pay | Admitting: Plastic Surgery

## 2015-09-27 LAB — CBC WITH DIFFERENTIAL/PLATELET
BASOS PCT: 1 %
Basophils Absolute: 0.1 10*3/uL (ref 0.0–0.1)
EOS ABS: 0.9 10*3/uL — AB (ref 0.0–0.7)
EOS PCT: 8 %
HEMATOCRIT: 27.3 % — AB (ref 39.0–52.0)
Hemoglobin: 8.2 g/dL — ABNORMAL LOW (ref 13.0–17.0)
LYMPHS ABS: 0.8 10*3/uL (ref 0.7–4.0)
Lymphocytes Relative: 7 %
MCH: 28 pg (ref 26.0–34.0)
MCHC: 30 g/dL (ref 30.0–36.0)
MCV: 93.2 fL (ref 78.0–100.0)
MONO ABS: 1.1 10*3/uL — AB (ref 0.1–1.0)
Monocytes Relative: 10 %
NEUTROS ABS: 8.3 10*3/uL — AB (ref 1.7–7.7)
NEUTROS PCT: 74 %
PLATELETS: 458 10*3/uL — AB (ref 150–400)
RBC: 2.93 MIL/uL — ABNORMAL LOW (ref 4.22–5.81)
RDW: 15.4 % (ref 11.5–15.5)
WBC: 11.2 10*3/uL — ABNORMAL HIGH (ref 4.0–10.5)

## 2015-09-27 LAB — BASIC METABOLIC PANEL
ANION GAP: 9 (ref 5–15)
BUN: 13 mg/dL (ref 6–20)
CALCIUM: 8.8 mg/dL — AB (ref 8.9–10.3)
CO2: 29 mmol/L (ref 22–32)
CREATININE: 0.55 mg/dL — AB (ref 0.61–1.24)
Chloride: 100 mmol/L — ABNORMAL LOW (ref 101–111)
GFR calc Af Amer: >60 mL/min (ref >60)
GLUCOSE: 103 mg/dL — AB (ref 65–99)
Potassium: 3.9 mmol/L (ref 3.5–5.1)
Sodium: 138 mmol/L (ref 135–145)

## 2015-10-02 LAB — BASIC METABOLIC PANEL
Anion gap: 8 (ref 5–15)
BUN: 12 mg/dL (ref 6–20)
CHLORIDE: 103 mmol/L (ref 101–111)
CO2: 29 mmol/L (ref 22–32)
CREATININE: 0.53 mg/dL — AB (ref 0.61–1.24)
Calcium: 9 mg/dL (ref 8.9–10.3)
GFR calc Af Amer: >60 mL/min (ref >60)
GFR calc non Af Amer: >60 mL/min (ref >60)
GLUCOSE: 97 mg/dL (ref 65–99)
Potassium: 3.6 mmol/L (ref 3.5–5.1)
SODIUM: 140 mmol/L (ref 135–145)

## 2015-10-02 LAB — CBC
HEMATOCRIT: 27.7 % — AB (ref 39.0–52.0)
Hemoglobin: 8.6 g/dL — ABNORMAL LOW (ref 13.0–17.0)
MCH: 29.1 pg (ref 26.0–34.0)
MCHC: 31 g/dL (ref 30.0–36.0)
MCV: 93.6 fL (ref 78.0–100.0)
PLATELETS: 613 10*3/uL — AB (ref 150–400)
RBC: 2.96 MIL/uL — ABNORMAL LOW (ref 4.22–5.81)
RDW: 15.6 % — AB (ref 11.5–15.5)
WBC: 9.9 10*3/uL (ref 4.0–10.5)

## 2015-10-05 ENCOUNTER — Encounter (HOSPITAL_COMMUNITY)
Admission: AD | Disposition: A | Discharge status: Skilled Nursing Facility | Payer: Self-pay | Source: Ambulatory Visit | Attending: Plastic Surgery

## 2015-10-05 ENCOUNTER — Other Ambulatory Visit: Payer: Self-pay | Admitting: Plastic Surgery

## 2015-10-05 ENCOUNTER — Encounter (HOSPITAL_COMMUNITY): Payer: Self-pay | Admitting: Certified Registered Nurse Anesthetist

## 2015-10-05 ENCOUNTER — Encounter: Payer: Self-pay | Admitting: Plastic Surgery

## 2015-10-05 ENCOUNTER — Inpatient Hospital Stay
Admission: AD | Admit: 2015-10-05 | Discharge: 2015-10-11 | Disposition: A | Discharge status: Other Acute Inpatient Hospital | Payer: 59 | Source: Ambulatory Visit | Attending: Internal Medicine | Admitting: Internal Medicine

## 2015-10-05 DIAGNOSIS — S31109A Unspecified open wound of abdominal wall, unspecified quadrant without penetration into peritoneal cavity, initial encounter: Secondary | ICD-10-CM

## 2015-10-05 HISTORY — PX: APPLICATION OF A-CELL OF EXTREMITY: SHX6303

## 2015-10-05 HISTORY — PX: INCISION AND DRAINAGE OF WOUND: SHX1803

## 2015-10-05 SURGERY — IRRIGATION AND DEBRIDEMENT WOUND
Anesthesia: General | Site: Abdomen

## 2015-10-05 MED ORDER — MIDAZOLAM HCL 5 MG/5ML IJ SOLN
INTRAMUSCULAR | Status: DC | PRN
  Administered 2015-10-05: 2 mg via INTRAVENOUS

## 2015-10-05 MED ORDER — LIDOCAINE HCL (CARDIAC) 20 MG/ML IV SOLN
INTRAVENOUS | Status: DC | PRN
  Administered 2015-10-05: 100 mg via INTRAVENOUS

## 2015-10-05 MED ORDER — PROPOFOL 10 MG/ML IV BOLUS
INTRAVENOUS | Status: AC
  Filled 2015-10-05: qty 20

## 2015-10-05 MED ORDER — MIDAZOLAM HCL 2 MG/2ML IJ SOLN
INTRAMUSCULAR | Status: AC
  Filled 2015-10-05: qty 2

## 2015-10-05 MED ORDER — FENTANYL CITRATE (PF) 250 MCG/5ML IJ SOLN
INTRAMUSCULAR | Status: AC
  Filled 2015-10-05: qty 5

## 2015-10-05 MED ORDER — DEXAMETHASONE SODIUM PHOSPHATE 10 MG/ML IJ SOLN
INTRAMUSCULAR | Status: AC
  Filled 2015-10-05: qty 1

## 2015-10-05 MED ORDER — PROPOFOL 10 MG/ML IV BOLUS
INTRAVENOUS | Status: DC | PRN
  Administered 2015-10-05: 50 mg via INTRAVENOUS
  Administered 2015-10-05: 100 mg via INTRAVENOUS

## 2015-10-05 MED ORDER — ONDANSETRON HCL 4 MG/2ML IJ SOLN
INTRAMUSCULAR | Status: AC
  Filled 2015-10-05: qty 2

## 2015-10-05 MED ORDER — FENTANYL CITRATE (PF) 100 MCG/2ML IJ SOLN
INTRAMUSCULAR | Status: DC | PRN
Start: 2015-10-05 — End: 2015-10-05
  Administered 2015-10-05: 50 ug via INTRAVENOUS

## 2015-10-05 MED ORDER — DEXTROSE 5 % IV SOLN
10.0000 mg | INTRAVENOUS | Status: DC | PRN
  Administered 2015-10-05: 25 ug/min via INTRAVENOUS

## 2015-10-05 MED ORDER — CEFAZOLIN SODIUM-DEXTROSE 2-4 GM/100ML-% IV SOLN
INTRAVENOUS | Status: AC
  Filled 2015-10-05: qty 100

## 2015-10-05 MED ORDER — ARTIFICIAL TEARS OP OINT
TOPICAL_OINTMENT | OPHTHALMIC | Status: AC
  Filled 2015-10-05: qty 3.5

## 2015-10-05 MED ORDER — LIDOCAINE 2% (20 MG/ML) 5 ML SYRINGE
INTRAMUSCULAR | Status: AC
  Filled 2015-10-05: qty 10

## 2015-10-05 MED ORDER — ONDANSETRON HCL 4 MG/2ML IJ SOLN
INTRAMUSCULAR | Status: DC | PRN
  Administered 2015-10-05: 4 mg via INTRAVENOUS

## 2015-10-05 MED ORDER — CEFAZOLIN SODIUM-DEXTROSE 2-4 GM/100ML-% IV SOLN
2.0000 g | INTRAVENOUS | Status: AC
  Administered 2015-10-05: 2 g via INTRAVENOUS

## 2015-10-05 MED ORDER — SODIUM CHLORIDE 0.9 % IR SOLN
Status: DC | PRN
  Administered 2015-10-05: 1000 mL

## 2015-10-05 MED ORDER — SODIUM CHLORIDE 0.9 % IJ SOLN
INTRAMUSCULAR | Status: DC | PRN
  Administered 2015-10-05 (×3): 10 mL

## 2015-10-05 MED ORDER — PHENYLEPHRINE HCL 10 MG/ML IJ SOLN
INTRAMUSCULAR | Status: DC | PRN
  Administered 2015-10-05: 120 ug via INTRAVENOUS
  Administered 2015-10-05: 80 ug via INTRAVENOUS

## 2015-10-05 SURGICAL SUPPLY — 58 items
BAG DECANTER FOR FLEXI CONT (MISCELLANEOUS) IMPLANT
BENZOIN TINCTURE PRP APPL 2/3 (GAUZE/BANDAGES/DRESSINGS) IMPLANT
CANISTER SUCTION 2500CC (MISCELLANEOUS) ×3 IMPLANT
CANISTER WOUND CARE 500ML ATS (WOUND CARE) IMPLANT
CONT SPEC STER OR (MISCELLANEOUS) IMPLANT
COVER MAYO STAND STRL (DRAPES) ×3 IMPLANT
COVER SURGICAL LIGHT HANDLE (MISCELLANEOUS) ×3 IMPLANT
DRAPE IMP U-DRAPE 54X76 (DRAPES) IMPLANT
DRAPE INCISE IOBAN 66X45 STRL (DRAPES) IMPLANT
DRAPE LAPAROSCOPIC ABDOMINAL (DRAPES) ×3 IMPLANT
DRAPE PED LAPAROTOMY (DRAPES) ×3 IMPLANT
DRAPE PROXIMA HALF (DRAPES) IMPLANT
DRESSING HYDROCOLLOID 4X4 (GAUZE/BANDAGES/DRESSINGS) IMPLANT
DRSG ADAPTIC 3X8 NADH LF (GAUZE/BANDAGES/DRESSINGS) IMPLANT
DRSG CUTIMED SORBACT 7X9 (GAUZE/BANDAGES/DRESSINGS) ×3 IMPLANT
DRSG PAD ABDOMINAL 8X10 ST (GAUZE/BANDAGES/DRESSINGS) IMPLANT
DRSG VAC ATS LRG SENSATRAC (GAUZE/BANDAGES/DRESSINGS) IMPLANT
DRSG VAC ATS MED SENSATRAC (GAUZE/BANDAGES/DRESSINGS) IMPLANT
DRSG VAC ATS SM SENSATRAC (GAUZE/BANDAGES/DRESSINGS) IMPLANT
ELECT CAUTERY BLADE 6.4 (BLADE) ×3 IMPLANT
ELECT REM PT RETURN 9FT ADLT (ELECTROSURGICAL) ×3
ELECTRODE REM PT RTRN 9FT ADLT (ELECTROSURGICAL) ×1 IMPLANT
GAUZE SPONGE 4X4 12PLY STRL (GAUZE/BANDAGES/DRESSINGS) IMPLANT
GEL ULTRASOUND 20GR AQUASONIC (MISCELLANEOUS) IMPLANT
GLOVE BIO SURGEON STRL SZ 6.5 (GLOVE) ×4 IMPLANT
GLOVE BIO SURGEONS STRL SZ 6.5 (GLOVE) ×2
GLOVE BIOGEL PI IND STRL 7.0 (GLOVE) ×1 IMPLANT
GLOVE BIOGEL PI INDICATOR 7.0 (GLOVE) ×2
GLOVE SURG SS PI 7.0 STRL IVOR (GLOVE) ×6 IMPLANT
GOWN STRL REUS W/ TWL LRG LVL3 (GOWN DISPOSABLE) ×3 IMPLANT
GOWN STRL REUS W/TWL LRG LVL3 (GOWN DISPOSABLE) ×6
KIT BASIN OR (CUSTOM PROCEDURE TRAY) ×3 IMPLANT
KIT ROOM TURNOVER OR (KITS) ×3 IMPLANT
MATRIX SURGICAL PSM 5X5CM (Tissue) ×6 IMPLANT
MATRIX SURGICAL PSM 7X10CM (Tissue) ×3 IMPLANT
MICROMATRIX 1000MG (Tissue) ×3 IMPLANT
NS IRRIG 1000ML POUR BTL (IV SOLUTION) ×3 IMPLANT
PACK GENERAL/GYN (CUSTOM PROCEDURE TRAY) IMPLANT
PACK ORTHO EXTREMITY (CUSTOM PROCEDURE TRAY) ×3 IMPLANT
PACK UNIVERSAL I (CUSTOM PROCEDURE TRAY) IMPLANT
PAD ABD 8X10 STRL (GAUZE/BANDAGES/DRESSINGS) ×3 IMPLANT
PAD ARMBOARD 7.5X6 YLW CONV (MISCELLANEOUS) ×6 IMPLANT
SOLUTION PARTIC MCRMTRX 1000MG (Tissue) ×1 IMPLANT
SPONGE GAUZE 4X4 12PLY STER LF (GAUZE/BANDAGES/DRESSINGS) ×3 IMPLANT
STAPLER VISISTAT 35W (STAPLE) ×3 IMPLANT
STOCKINETTE IMPERVIOUS 9X36 MD (GAUZE/BANDAGES/DRESSINGS) IMPLANT
STOCKINETTE IMPERVIOUS LG (DRAPES) IMPLANT
SURGILUBE 2OZ TUBE FLIPTOP (MISCELLANEOUS) ×12 IMPLANT
SUT SILK 4 0 P 3 (SUTURE) ×6 IMPLANT
SUT VIC AB 5-0 PS2 18 (SUTURE) ×18 IMPLANT
SWAB COLLECTION DEVICE MRSA (MISCELLANEOUS) IMPLANT
TOWEL OR 17X24 6PK STRL BLUE (TOWEL DISPOSABLE) ×3 IMPLANT
TOWEL OR 17X26 10 PK STRL BLUE (TOWEL DISPOSABLE) IMPLANT
TUBE ANAEROBIC SPECIMEN COL (MISCELLANEOUS) IMPLANT
TUBE CONNECTING 12'X1/4 (SUCTIONS) ×1
TUBE CONNECTING 12X1/4 (SUCTIONS) ×2 IMPLANT
UNDERPAD 30X30 INCONTINENT (UNDERPADS AND DIAPERS) ×3 IMPLANT
YANKAUER SUCT BULB TIP NO VENT (SUCTIONS) ×3 IMPLANT

## 2015-10-05 NOTE — Interval H&P Note (Signed)
History and Physical Interval Note:  10/05/2015 7:24 AM  Rodney [name]  has presented today for surgery, with the diagnosis of scrotal wound  The various methods of treatment have been discussed with the patient and family. After consideration of risks, benefits and other options for treatment, the patient has consented to  Procedure(s): IRRIGATION AND DEBRIDEMENT scrotal WOUND (N/A) APPLICATION OF A-CELL OF scrotum (N/A) as a surgical intervention .  The patient's history has been reviewed, patient examined, no change in status, stable for surgery.  I have reviewed the patient's chart and labs.  Questions were answered to the patient's satisfaction.     [name]

## 2015-10-05 NOTE — Progress Notes (Signed)
Lars Mage, CRNA and Dr. Gifford Shave aware that 22G IV inserted last night, infusing without difficulty.

## 2015-10-05 NOTE — Transfer of Care (Signed)
Immediate Anesthesia Transfer of Care Note  Patient: [name]  Procedure(s) Performed: Procedure(s): IRRIGATION AND DEBRIDEMENT OF SCROTAL, ABDOMINAL  AND PERINEAL WOUND (N/A) APPLICATION OF A-CELL TO SCROTAL, ABDOMINAL  AND PERINEAL WOUND (N/A)  Patient Location: PACU  Anesthesia Type:General  Level of Consciousness: awake, alert , oriented and patient cooperative  Airway & Oxygen Therapy: spontaneous breathing with nasal cannula   Post-op Assessment: Report given to RN and Post -op Vital signs reviewed and stable  Post vital signs: Reviewed and stable  Last Vitals:  Filed Vitals:   09/25/15 1255 09/25/15 1310  BP: 113/76 103/76  Pulse: 85 81  Temp:  36 C  Resp: 11 12    Last Pain:  Filed Vitals:   09/25/15 1312  PainSc: Asleep         Complications: No apparent anesthesia complications

## 2015-10-05 NOTE — Brief Op Note (Signed)
09/08/2015 - 10/05/2015  9:09 AM  PATIENT:  Rodney [name]  65 y.o. male  PRE-OPERATIVE DIAGNOSIS:  scrotal wound  POST-OPERATIVE DIAGNOSIS:  scrotal wound  PROCEDURE:  Procedure(s): IRRIGATION AND DEBRIDEMENT scrotal WOUND (N/A) APPLICATION OF A-CELL OF scrotum (N/A)  SURGEON:  Surgeon(s) and Role:    * Loel Lofty Christyna Letendre, DO - Primary  PHYSICIAN ASSISTANT: Shawn Rayburn, PA  ASSISTANTS: none   ANESTHESIA:   general  EBL:     BLOOD ADMINISTERED:none  DRAINS: none   LOCAL MEDICATIONS USED:  NONE  SPECIMEN:  No Specimen  DISPOSITION OF SPECIMEN:  N/A  COUNTS:  YES  TOURNIQUET:  * No tourniquets in log *  DICTATION: .Dragon Dictation  PLAN OF CARE: [facility]  PATIENT DISPOSITION:  PACU - hemodynamically stable.   Delay start of Pharmacological VTE agent (>24hrs) due to surgical blood loss or risk of bleeding: no

## 2015-10-05 NOTE — Anesthesia Postprocedure Evaluation (Signed)
Anesthesia Post Note  Patient: Rodney [name]  Procedure(s) Performed: Procedure(s) (LRB): IRRIGATION AND DEBRIDEMENT OF SCROTAL, ABDOMINAL  AND PERINEAL WOUND (N/A) APPLICATION OF A-CELL TO SCROTAL, ABDOMINAL  AND PERINEAL WOUND (N/A)  Patient location during evaluation: PACU Anesthesia Type: General Level of consciousness: awake and alert Pain management: pain level controlled Vital Signs Assessment: post-procedure vital signs reviewed and stable Respiratory status: spontaneous breathing, nonlabored ventilation, respiratory function stable and patient connected to nasal cannula oxygen Cardiovascular status: blood pressure returned to baseline and stable Postop Assessment: no signs of nausea or vomiting Anesthetic complications: no    Last Vitals:  Filed Vitals:   10/05/15 1040 10/05/15 1045  BP: 111/84   Pulse: 87   Temp:  36.4 C  Resp: 11     Last Pain:  Filed Vitals:   10/05/15 1049  PainSc: Asleep                 Catalina Gravel

## 2015-10-05 NOTE — Op Note (Signed)
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   COMPLICATIONS: None.   INDICATIONS FOR PROCEDURE:  The patient, Rodney [name] is a 65 y.o. male born on 06/07/1950, is here for treatment fournier's gangrene.  He is currently at [facility] for rehab.  He had Acell placed ~ 1 week ago and returns for further care. MRN: [id number]  CONSENT:  Informed consent was obtained directly from the patient. Risks, benefits and alternatives were fully discussed. Specific risks including but not limited to bleeding, infection, hematoma, seroma, scarring, pain, infection, contracture, asymmetry, wound healing problems, and need for further surgery were all discussed. The patient did have an ample opportunity to have questions answered to satisfaction.   DESCRIPTION OF PROCEDURE:  The patient was taken to the operating room. SCDs were placed and IV antibiotics were given. The patient's operative site was prepped and draped in a sterile fashion. A time out was performed and all information was confirmed to be correct. General anesthesia was administered. The area was irrigated with antibiotic solution and saline. Debridement was done of the soft tissue with scissors of the entire area 25 x 25 cm. The Acell powder was applied to the groin and penis (1 gm). The Acell sheet 5 x 5 cm was applied and secured with 5-0 Vicryl to the lower groin. The Acell sheet was wrapped around the penis 7 x 10 cm and secured with 5-0 Vicryl. The 5 x 5 cm Acell  sheet was applied to the left lateral abdominal area and secured with 5-0 vicryl. The Sorbact was applied to the entire area and secured with 4-0 Silk and Vicryl. The KY gel was applied and 4 x 4 gauze and ABD. The patient tolerated the procedure well. There were no complications. The patient was allowed to wake from anesthesia, extubated and taken to the recovery room in satisfactory condition.

## 2015-10-05 NOTE — Anesthesia Preprocedure Evaluation (Addendum)
Anesthesia Evaluation  Patient identified by MRN, date of birth, ID band Patient awake    Reviewed: Allergy & Precautions, NPO status , Patient's Chart, lab work & pertinent test results, reviewed documented beta blocker date and time   Airway Mallampati: II  TM Distance: >3 FB Neck ROM: Full    Dental  (+) Teeth Intact, Dental Advisory Given   Pulmonary former smoker,    breath sounds clear to auscultation       Cardiovascular hypertension, Pt. on medications and Pt. on home beta blockers + CAD, + Past MI and + Peripheral Vascular Disease   Rhythm:regular Rate:Normal     Neuro/Psych negative neurological ROS  negative psych ROS   GI/Hepatic Neg liver ROS, Malnutrition    Endo/Other    Renal/GU Renal disease (AKI)     Musculoskeletal negative musculoskeletal ROS (+)   Abdominal   Peds  Hematology  (+) Blood dyscrasia, anemia ,   Anesthesia Other Findings Prostate cancer s/p prostatectomy  Reproductive/Obstetrics                            Anesthesia Physical Anesthesia Plan  ASA: III  Anesthesia Plan: General   Post-op Pain Management:    Induction: Intravenous  Airway Management Planned: LMA  Additional Equipment: None  Intra-op Plan:   Post-operative Plan:   Informed Consent: I have reviewed the patients History and Physical, chart, labs and discussed the procedure including the risks, benefits and alternatives for the proposed anesthesia with the patient or authorized representative who has indicated his/her understanding and acceptance.   Dental advisory given  Plan Discussed with: Anesthesiologist, CRNA and Surgeon  Anesthesia Plan Comments:         Anesthesia Quick Evaluation

## 2015-10-05 NOTE — Anesthesia Procedure Notes (Signed)
Procedure Name: LMA Insertion Date/Time: 10/05/2015 9:18 AM Performed by: [place] Pink Pre-anesthesia Checklist: Patient identified, Emergency Drugs available, Suction available, Patient being monitored and Timeout performed Patient Re-evaluated:Patient Re-evaluated prior to inductionOxygen Delivery Method: Circle system utilized Preoxygenation: Pre-oxygenation with 100% oxygen Intubation Type: IV induction Ventilation: Mask ventilation without difficulty LMA: LMA inserted LMA Size: 5.0 Number of attempts: 1 Placement Confirmation: breath sounds checked- equal and bilateral and positive ETCO2 Tube secured with: Tape Dental Injury: Teeth and Oropharynx as per pre-operative assessment

## 2015-10-06 ENCOUNTER — Encounter (HOSPITAL_COMMUNITY): Payer: Self-pay | Admitting: Plastic Surgery

## 2015-10-06 ENCOUNTER — Encounter: Payer: Self-pay | Admitting: Vascular Surgery

## 2015-10-07 LAB — URINE MICROSCOPIC-ADD ON

## 2015-10-07 LAB — BASIC METABOLIC PANEL
Anion gap: 10 (ref 5–15)
BUN: 17 mg/dL (ref 6–20)
CO2: 32 mmol/L (ref 22–32)
Calcium: 8.9 mg/dL (ref 8.9–10.3)
Chloride: 99 mmol/L — ABNORMAL LOW (ref 101–111)
Creatinine, Ser: 0.73 mg/dL (ref 0.61–1.24)
GFR calc Af Amer: >60 mL/min (ref >60)
Glucose, Bld: 95 mg/dL (ref 65–99)
POTASSIUM: 3.9 mmol/L (ref 3.5–5.1)
Sodium: 141 mmol/L (ref 135–145)

## 2015-10-07 LAB — URINALYSIS, ROUTINE W REFLEX MICROSCOPIC
Bilirubin Urine: NEGATIVE
GLUCOSE, UA: 100 mg/dL — AB
KETONES UR: NEGATIVE mg/dL
NITRITE: NEGATIVE
PH: 8 (ref 5.0–8.0)
PROTEIN: NEGATIVE mg/dL
Specific Gravity, Urine: 1.016 (ref 1.005–1.030)

## 2015-10-07 LAB — CBC
HCT: 27.8 % — ABNORMAL LOW (ref 39.0–52.0)
Hemoglobin: 8.2 g/dL — ABNORMAL LOW (ref 13.0–17.0)
MCH: 27.8 pg (ref 26.0–34.0)
MCHC: 29.5 g/dL — AB (ref 30.0–36.0)
MCV: 94.2 fL (ref 78.0–100.0)
PLATELETS: 594 10*3/uL — AB (ref 150–400)
RBC: 2.95 MIL/uL — ABNORMAL LOW (ref 4.22–5.81)
RDW: 16.1 % — AB (ref 11.5–15.5)
WBC: 15 10*3/uL — ABNORMAL HIGH (ref 4.0–10.5)

## 2015-10-07 LAB — SEDIMENTATION RATE: Sed Rate: 75 mm/hr — ABNORMAL HIGH (ref 0–16)

## 2015-10-09 LAB — BASIC METABOLIC PANEL
ANION GAP: 10 (ref 5–15)
BUN: 13 mg/dL (ref 6–20)
CALCIUM: 9.2 mg/dL (ref 8.9–10.3)
CO2: 31 mmol/L (ref 22–32)
Chloride: 97 mmol/L — ABNORMAL LOW (ref 101–111)
Creatinine, Ser: 0.66 mg/dL (ref 0.61–1.24)
GFR calc Af Amer: >60 mL/min (ref >60)
Glucose, Bld: 118 mg/dL — ABNORMAL HIGH (ref 65–99)
POTASSIUM: 3.3 mmol/L — AB (ref 3.5–5.1)
Sodium: 138 mmol/L (ref 135–145)

## 2015-10-09 LAB — CBC WITH DIFFERENTIAL/PLATELET
BASOS ABS: 0.1 10*3/uL (ref 0.0–0.1)
Basophils Relative: 1 %
EOS PCT: 16 %
Eosinophils Absolute: 2.2 10*3/uL — ABNORMAL HIGH (ref 0.0–0.7)
HEMATOCRIT: 29.7 % — AB (ref 39.0–52.0)
Hemoglobin: 8.8 g/dL — ABNORMAL LOW (ref 13.0–17.0)
LYMPHS ABS: 1.3 10*3/uL (ref 0.7–4.0)
LYMPHS PCT: 9 %
MCH: 27.4 pg (ref 26.0–34.0)
MCHC: 29.6 g/dL — ABNORMAL LOW (ref 30.0–36.0)
MCV: 92.5 fL (ref 78.0–100.0)
MONO ABS: 1.3 10*3/uL — AB (ref 0.1–1.0)
Monocytes Relative: 9 %
NEUTROS ABS: 9.1 10*3/uL — AB (ref 1.7–7.7)
Neutrophils Relative %: 65 %
PLATELETS: 600 10*3/uL — AB (ref 150–400)
RBC: 3.21 MIL/uL — ABNORMAL LOW (ref 4.22–5.81)
RDW: 15.9 % — AB (ref 11.5–15.5)
WBC: 13.6 10*3/uL — ABNORMAL HIGH (ref 4.0–10.5)

## 2015-10-09 LAB — PHOSPHORUS: PHOSPHORUS: 4 mg/dL (ref 2.5–4.6)

## 2015-10-09 LAB — URINE CULTURE: Culture: >=100000 — AB

## 2015-10-09 LAB — MAGNESIUM: Magnesium: 1.6 mg/dL — ABNORMAL LOW (ref 1.7–2.4)

## 2015-10-10 LAB — BASIC METABOLIC PANEL
ANION GAP: 9 (ref 5–15)
BUN: 13 mg/dL (ref 6–20)
CALCIUM: 9.2 mg/dL (ref 8.9–10.3)
CO2: 31 mmol/L (ref 22–32)
CREATININE: 0.66 mg/dL (ref 0.61–1.24)
Chloride: 97 mmol/L — ABNORMAL LOW (ref 101–111)
GFR calc Af Amer: >60 mL/min (ref >60)
GLUCOSE: 119 mg/dL — AB (ref 65–99)
Potassium: 4.1 mmol/L (ref 3.5–5.1)
Sodium: 137 mmol/L (ref 135–145)

## 2015-10-11 ENCOUNTER — Ambulatory Visit (HOSPITAL_COMMUNITY): Payer: 59

## 2015-10-11 ENCOUNTER — Ambulatory Visit: Payer: 59 | Admitting: Vascular Surgery

## 2015-10-19 SURGERY — IRRIGATION AND DEBRIDEMENT EXTREMITY
Anesthesia: General

## 2016-06-16 IMAGING — CR DG CHEST 1V PORT
1 series · 1 of 1 positions shown · non-contrast
Comparison: 06/01/2013

CLINICAL DATA: Preoperative exam prior to vascular surgery

EXAM:
PORTABLE CHEST - 1 VIEW

[AP]
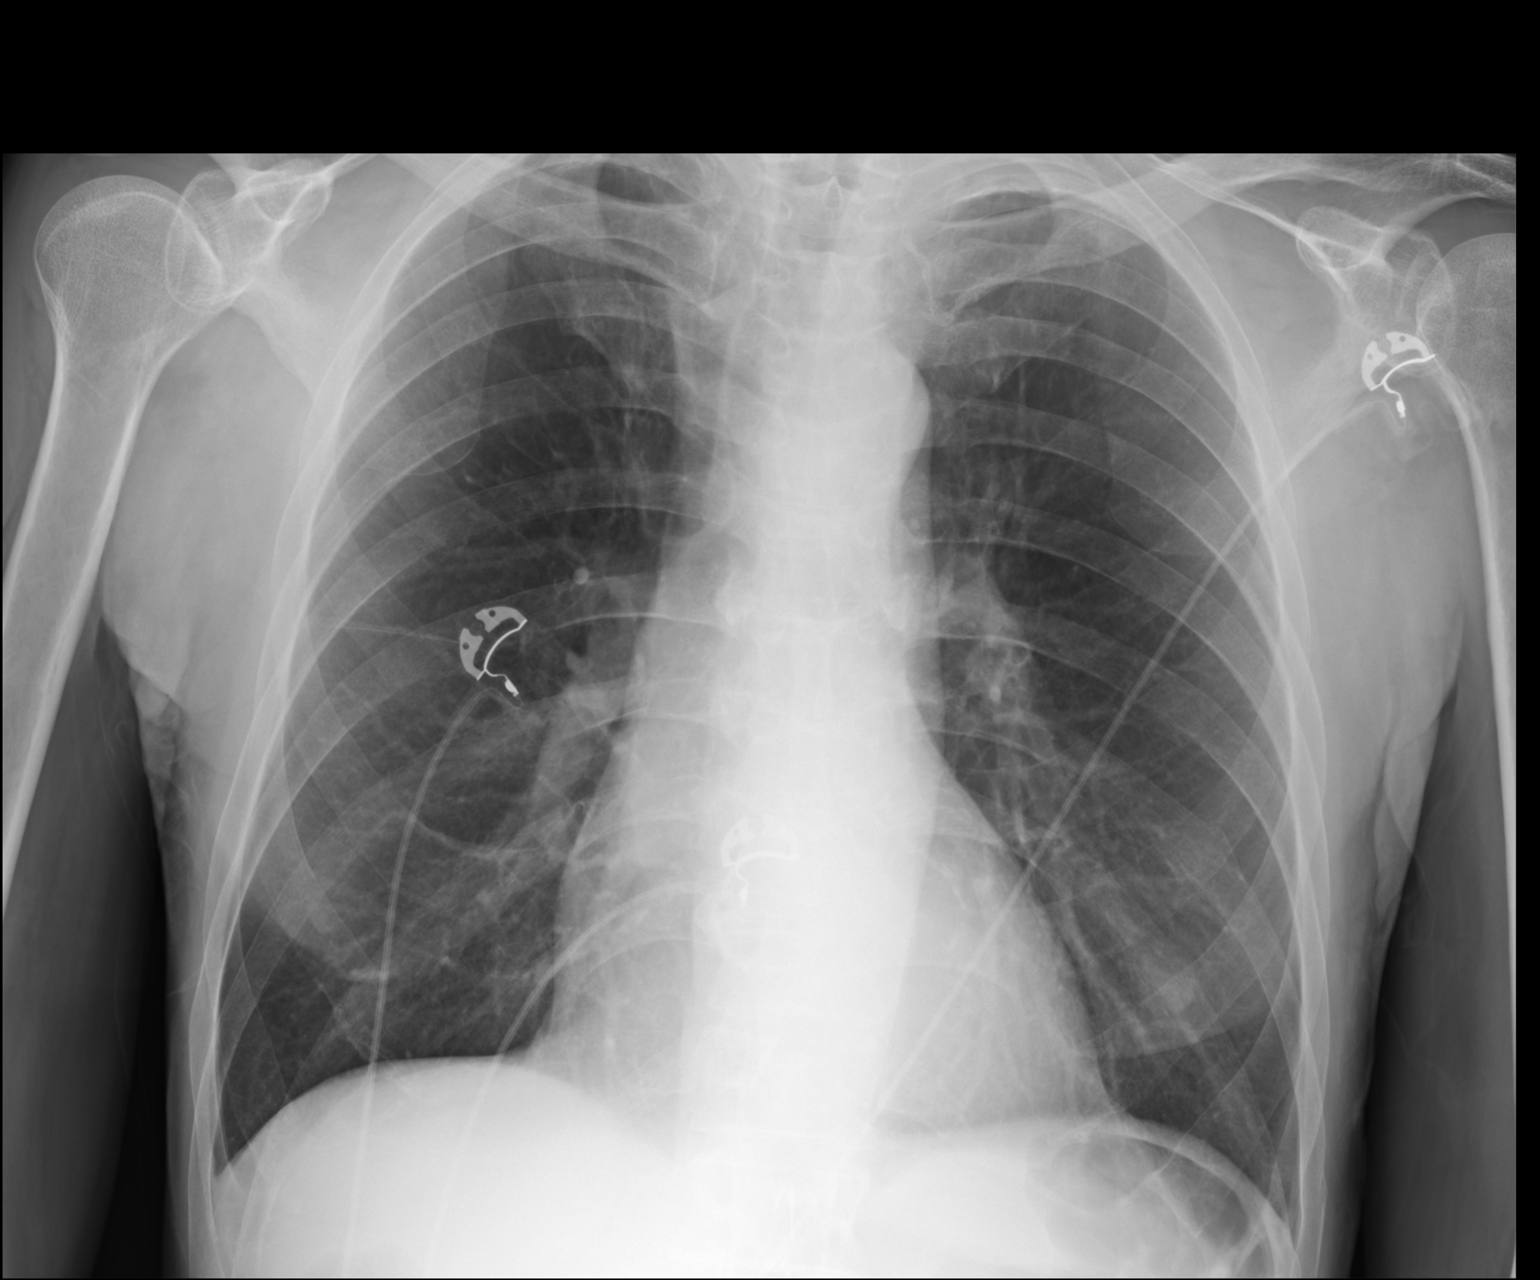

[1 of 1 positions shown; findings below may reference images not displayed]

FINDINGS: Heart size mildly enlarged. Presumed nipple shadows project over
both lung bases. No pleural effusion. No acute osseous abnormality.
IMPRESSION: No active disease.

## 2016-12-13 IMAGING — CR DG CHEST 2V
2 series · 2 of 2 positions shown · non-contrast
Comparison: August 03, 2015.

CLINICAL DATA: Shortness of breath.

EXAM:
CHEST  2 VIEW

[w chest lat]
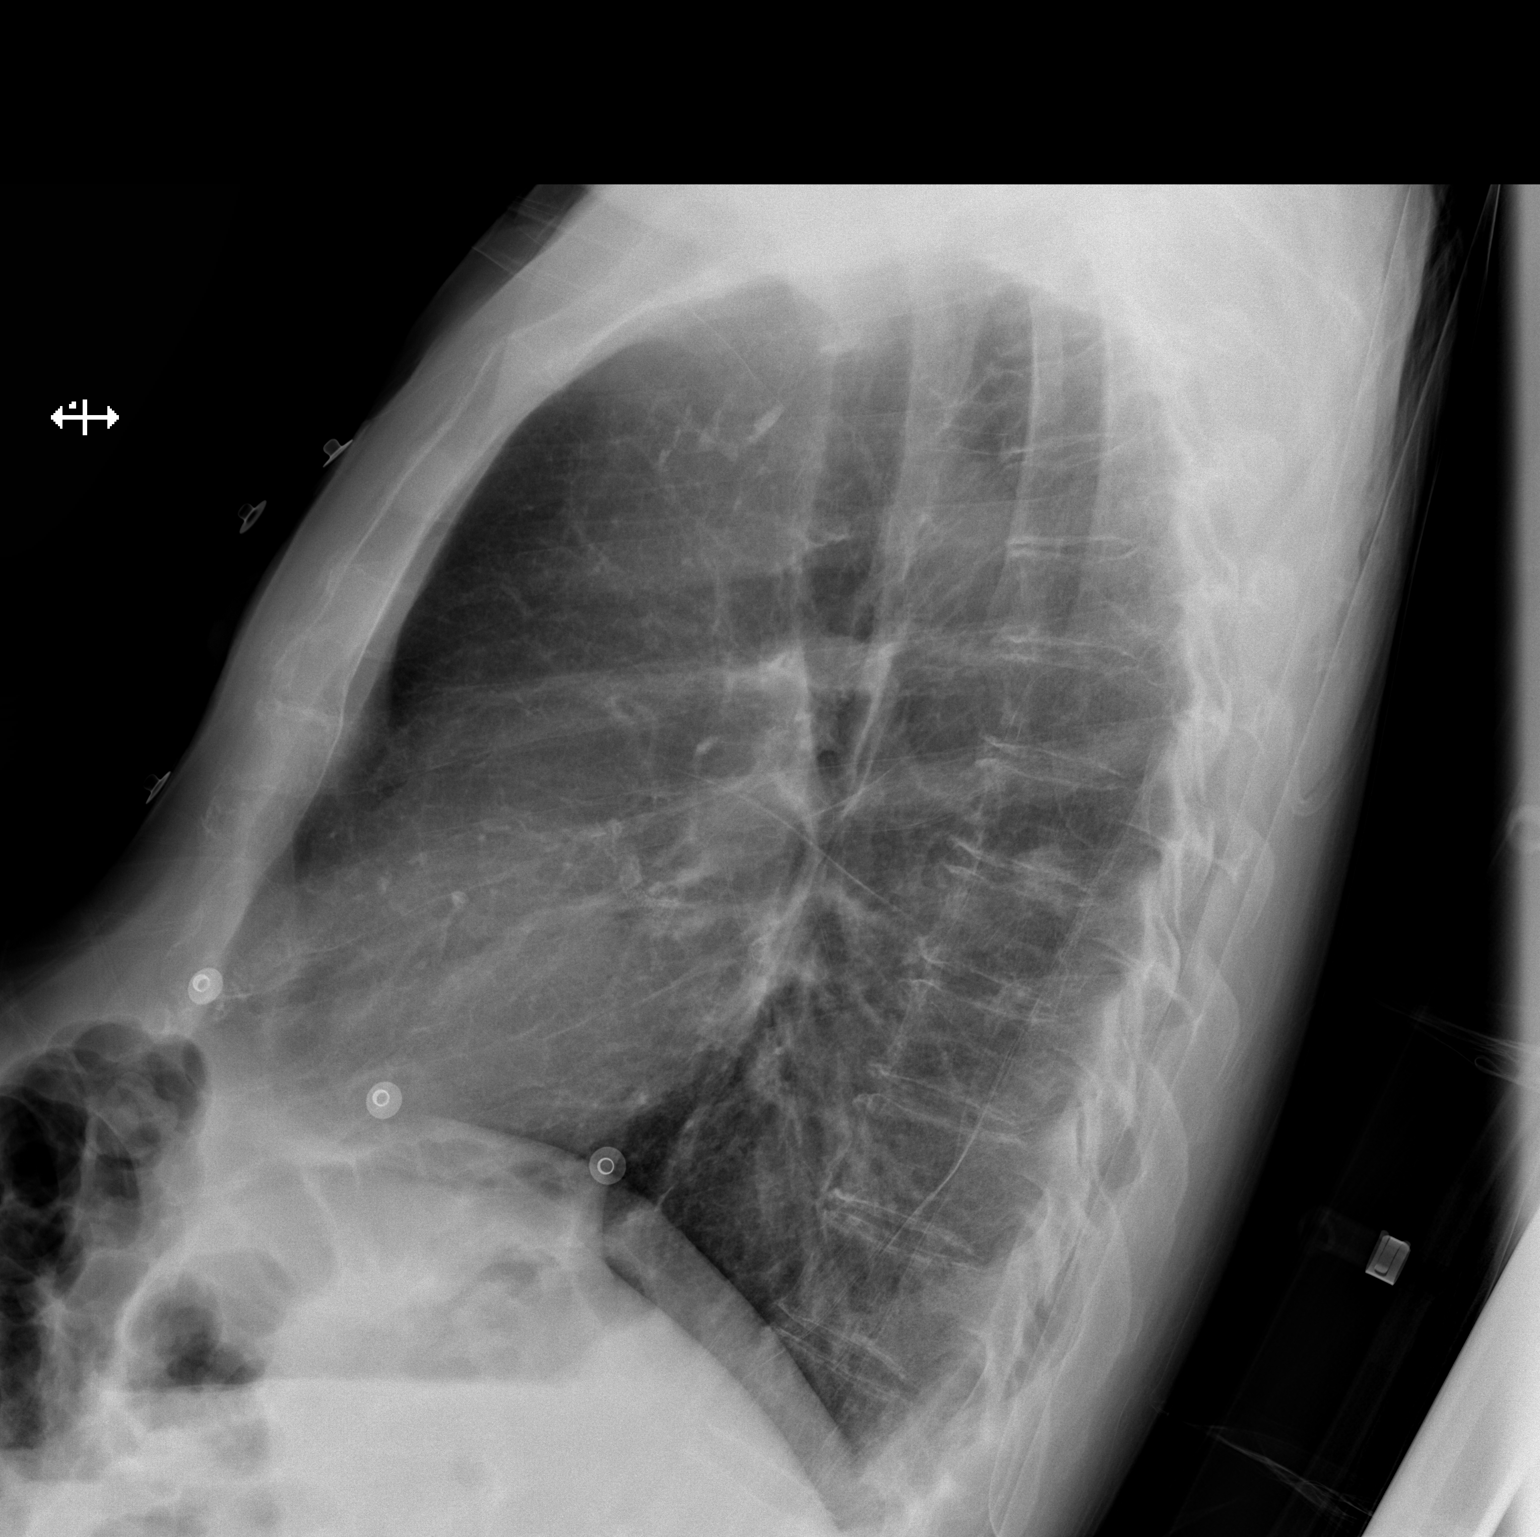

[x chest ap]
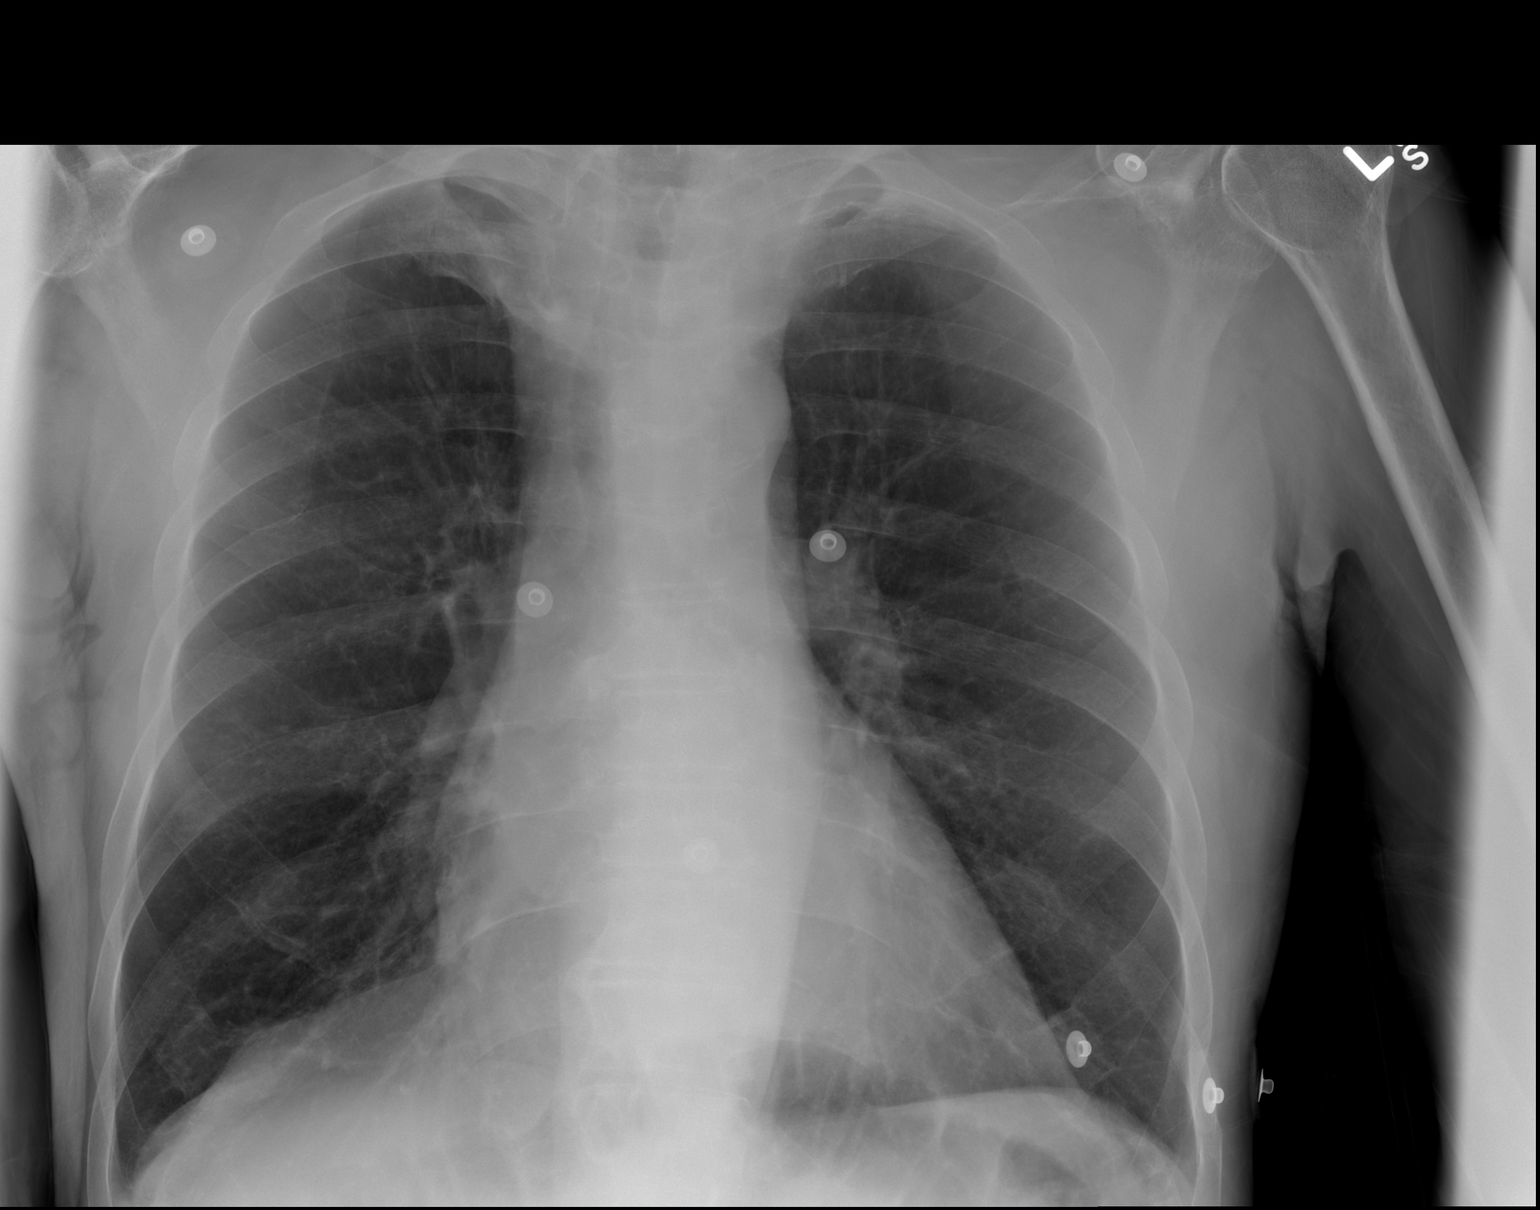

[2 of 2 positions shown; findings below may reference images not displayed]

FINDINGS: The heart size and mediastinal contours are within normal limits.
Both lungs are clear. No pneumothorax or pleural effusion is noted.
The visualized skeletal structures are unremarkable.
IMPRESSION: No active cardiopulmonary disease.

## 2016-12-29 IMAGING — CT CT PELVIS W/O CM
3 of 4 series · 16 of 46 positions shown, 18 images · non-contrast
Comparison: CT Abdomen and Pelvis 08/15/2015.

CLINICAL DATA: 65-year-old male with severe scrotal pain. Increased
pain with gangrenous tissue today. Initial encounter.

EXAM:
CT PELVIS WITHOUT CONTRAST
TECHNIQUE: Multidetector CT imaging of the pelvis was performed following the
standard protocol without intravenous contrast.

[Series 2: bone windows · axial · 0.74mm/px · z∈[+818,+884]mm · 3 of 108 slices shown]
[im 12/108  bone]
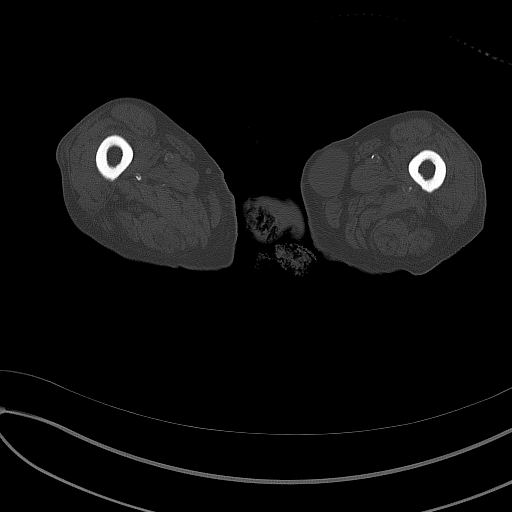
[im 23/108  bone]
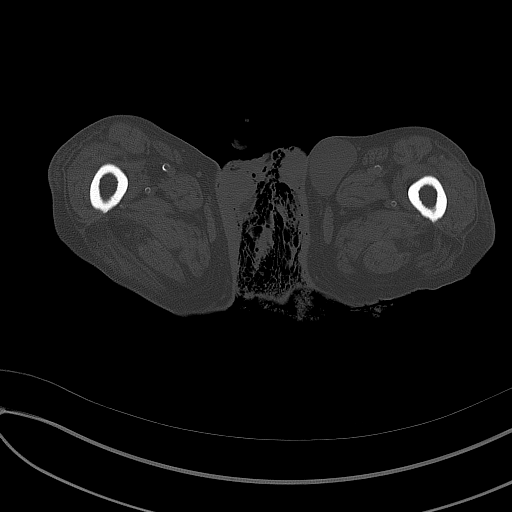
[im 34/108  bone]
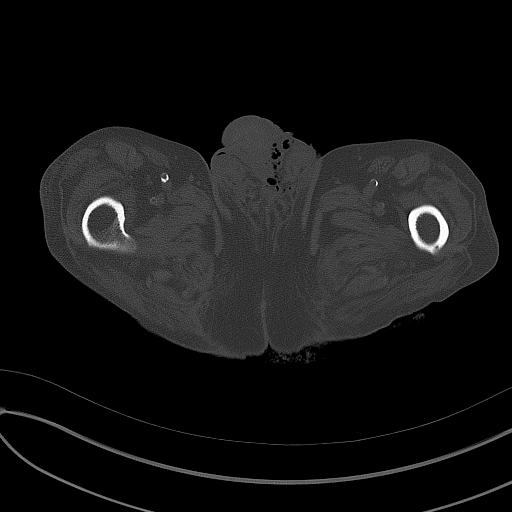

[Series 3: pelvis st · axial · 0.74mm/px · z∈[+811,+1076]mm · 10 of 65 slices shown, 12 images]
[im 6/65  soft-tissue]
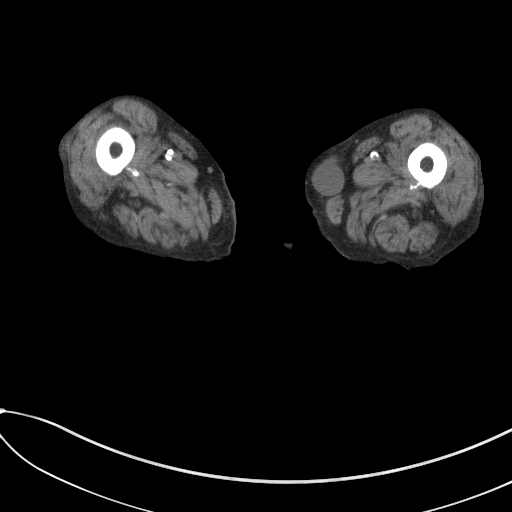
[im 6/65  bone]
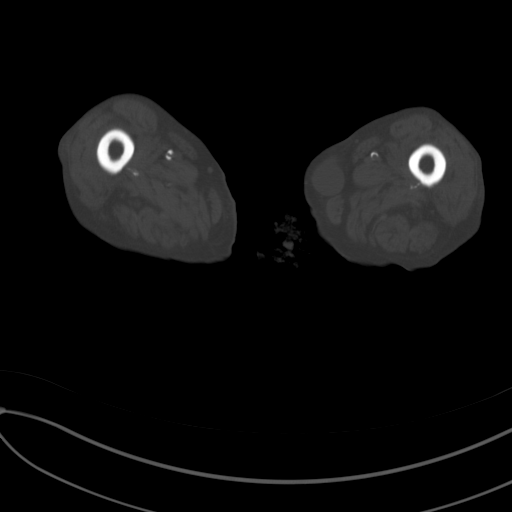
[im 12/65  soft-tissue]
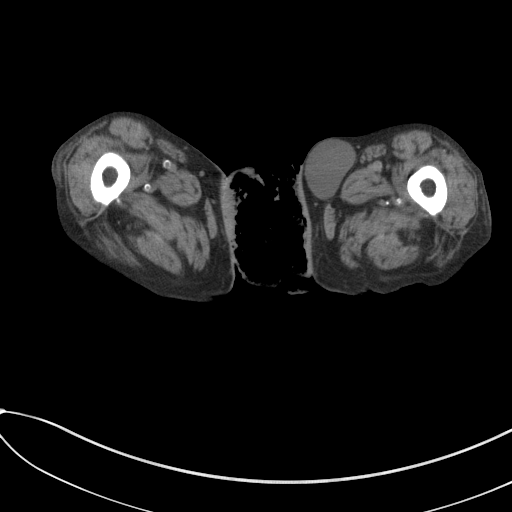
[im 18/65  soft-tissue]
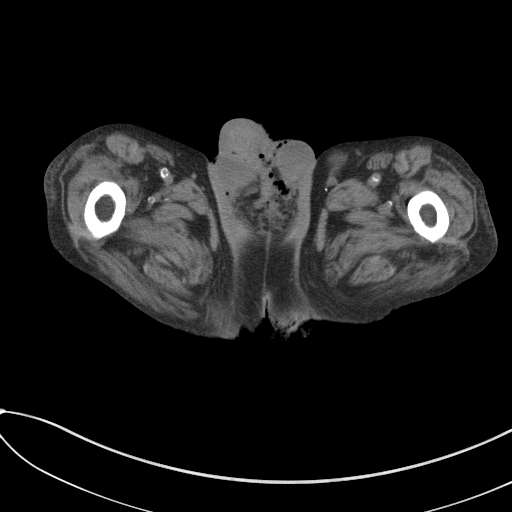
[im 24/65  soft-tissue]
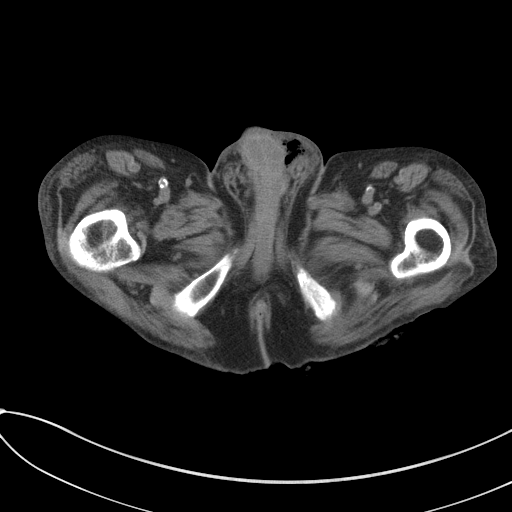
[im 30/65  soft-tissue]
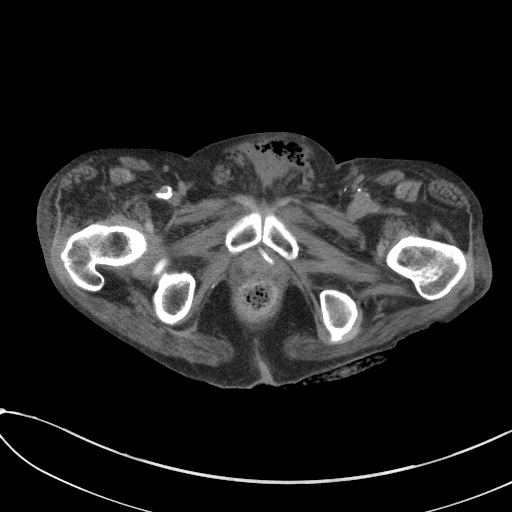
[im 35/65  soft-tissue]
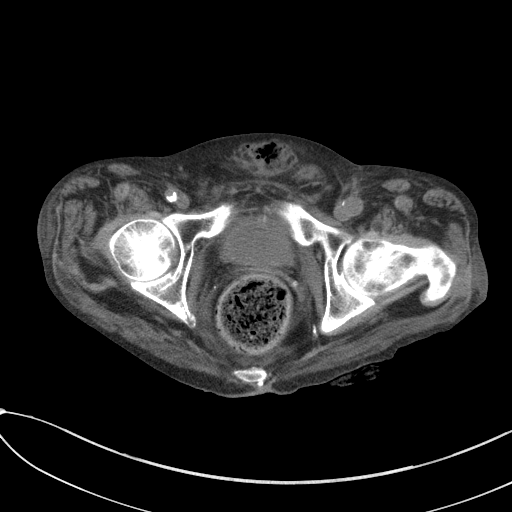
[im 41/65  soft-tissue]
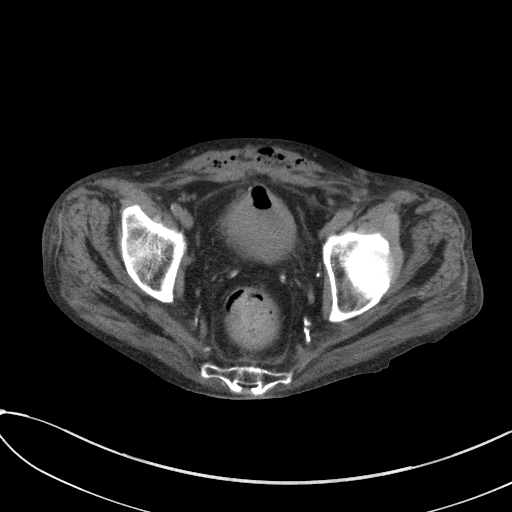
[im 47/65  soft-tissue]
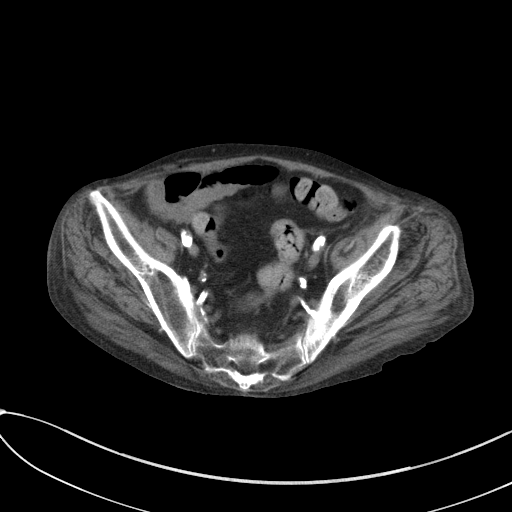
[im 53/65  soft-tissue]
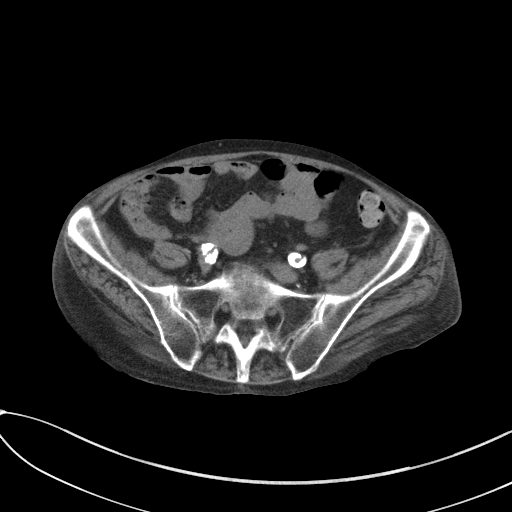
[im 53/65  bone]
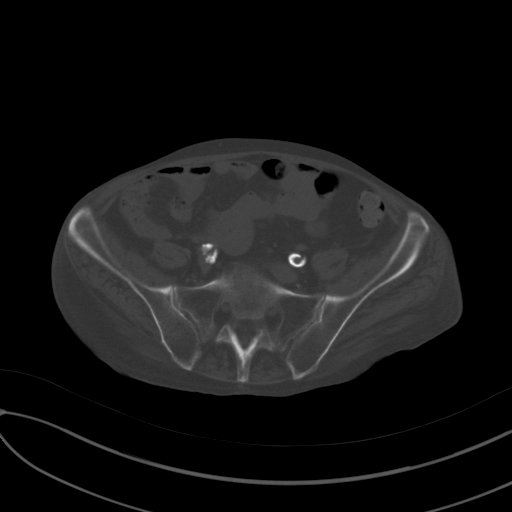
[im 59/65  soft-tissue]
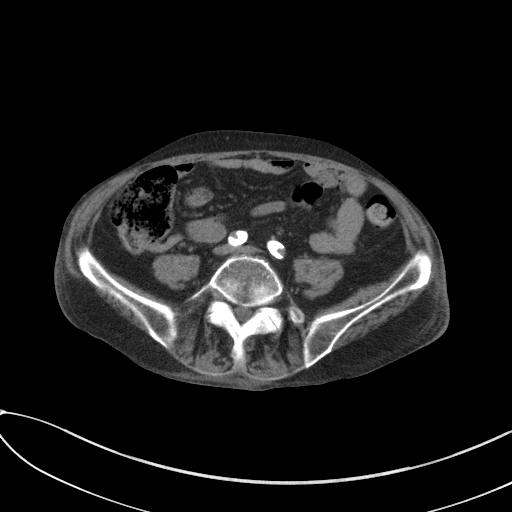

[Series 4: coronal images · coronal · 0.63mm/px · 3 of 108 slices shown]
[im 36/108  soft-tissue]
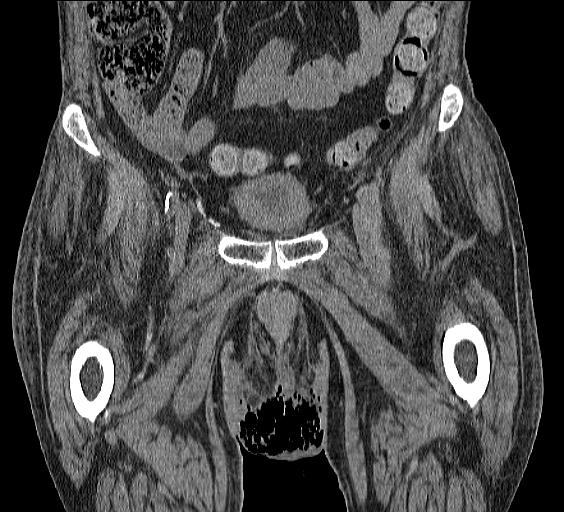
[im 48/108  soft-tissue]
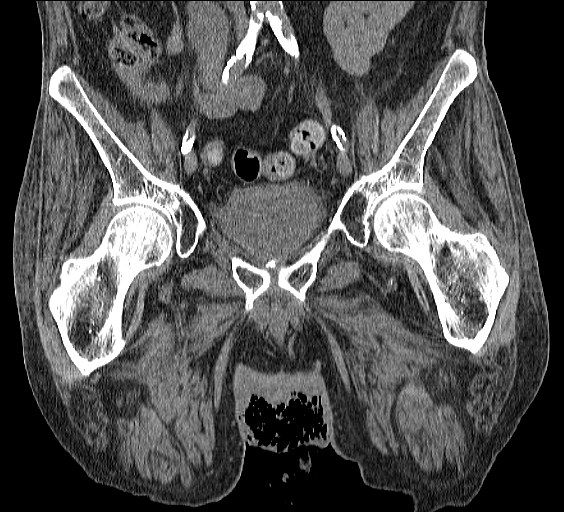
[im 60/108  soft-tissue]
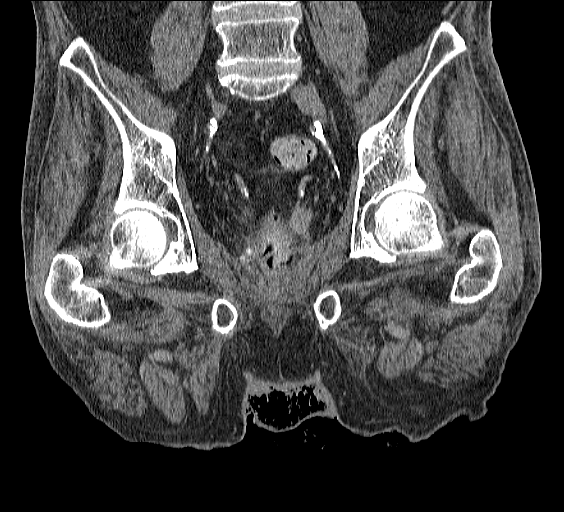

[16 of 46 positions shown; findings below may reference images not displayed]

FINDINGS: New widespread subcutaneous gas throughout the scrotum and tracking
cephalad to the suprapubic space and inferior rectus muscles. No
pelvic floor involvement. No retroperitoneal involvement.

There is an unchanged simple appearing circumscribed fluid
collection in the medial left thigh. Extensive Aortoiliac calcified
atherosclerosis noted. Calcified plaque continues into the bilateral
femoral arteries, worse on the right.

No pelvic free fluid. Stool ball in the rectum. Small volume of gas
in the urinary bladder. Retained stool in the distal colon. Negative
visualized distal small bowel. Negative appendix and visible cecum.
No pneumoperitoneum.

Osteopenia.   No acute osseous abnormality identified.
IMPRESSION: 1. Positive for Hallberg gangrene necrotizing fasciitis.
Subcutaneous gas tracks from the scrotum cephalad to the superficial
suprapubic soft tissues an inferior rectus muscles. This critical
value/emergent finding was discussed by telephone with YNSE
HAJI MORAD on 08/31/2015 at [DATE] .
2. Small volume of gas within the lumen of the urinary bladder.
3. No pelvic floor or retroperitoneal inflammation. Negative
visualized lower abdominal viscera.
4. Severe calcified atherosclerosis of the aorta, pelvic arteries
and femoral arteries.

## 2017-10-06 IMAGING — US US ABDOMEN COMPLETE
1 series · 14 of 25 positions shown · non-contrast
Comparison: None.

CLINICAL DATA: Generalized abdominal pain

EXAM:
ABDOMEN ULTRASOUND COMPLETE

[Series 1: us abdomen complete · 0.15mm/px · 14 of 228 slices shown]
[im 1/228]
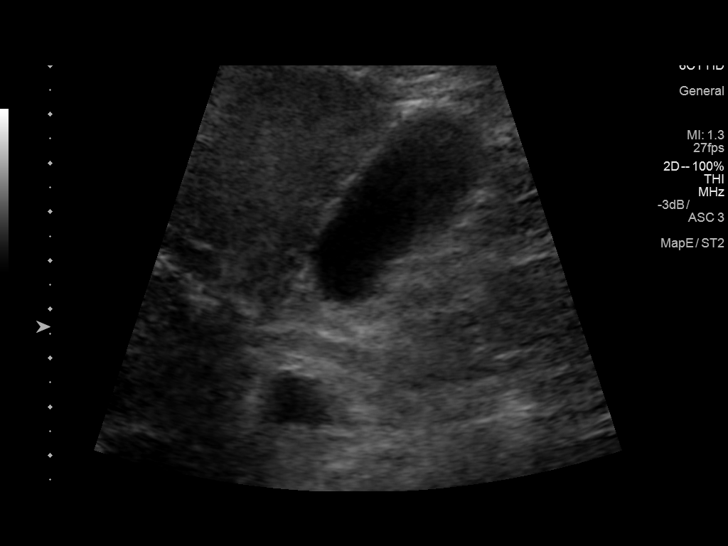
[im 19/228]
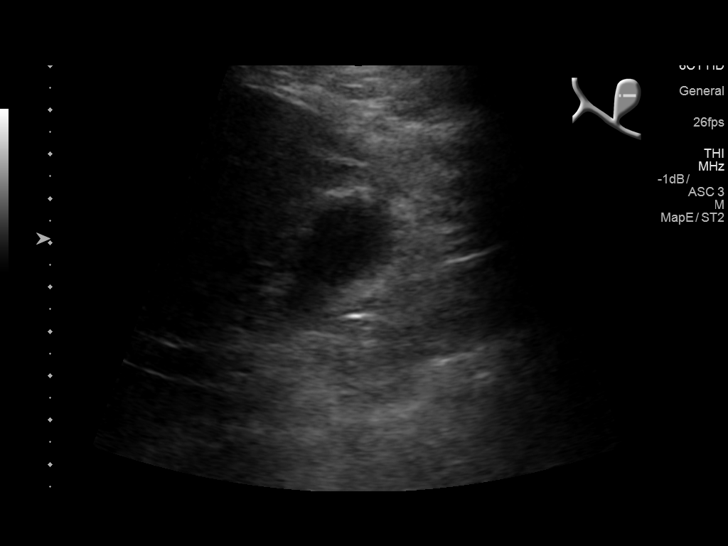
[im 38/228]
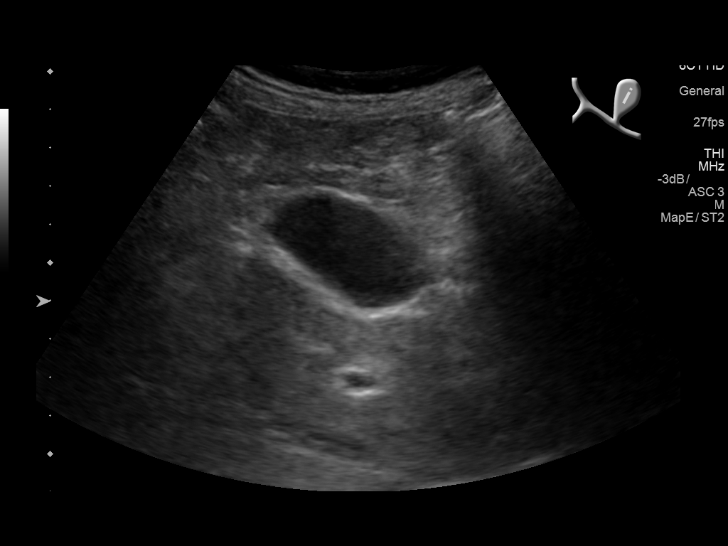
[im 57/228]
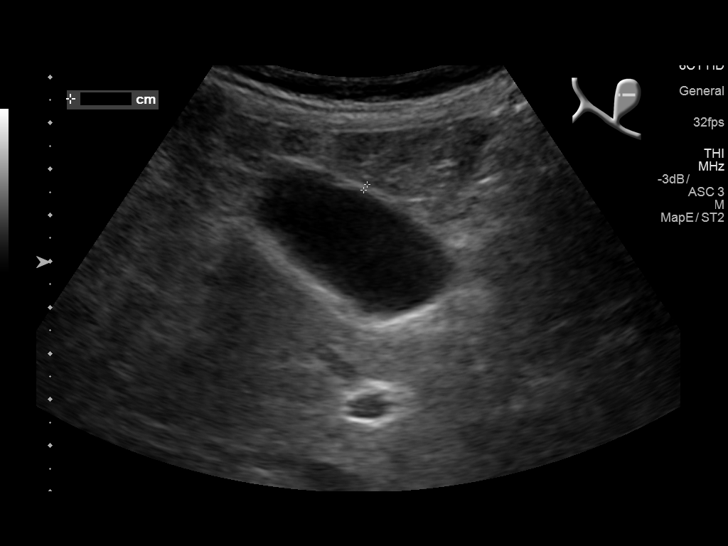
[im 76/228]
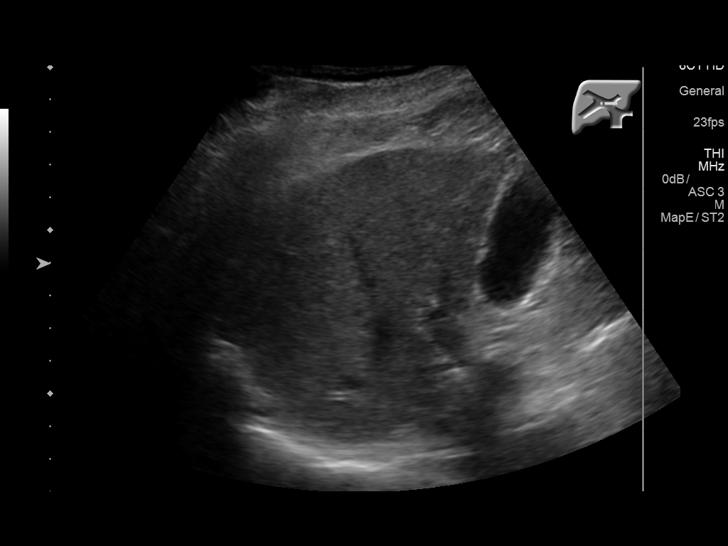
[im 86/228]
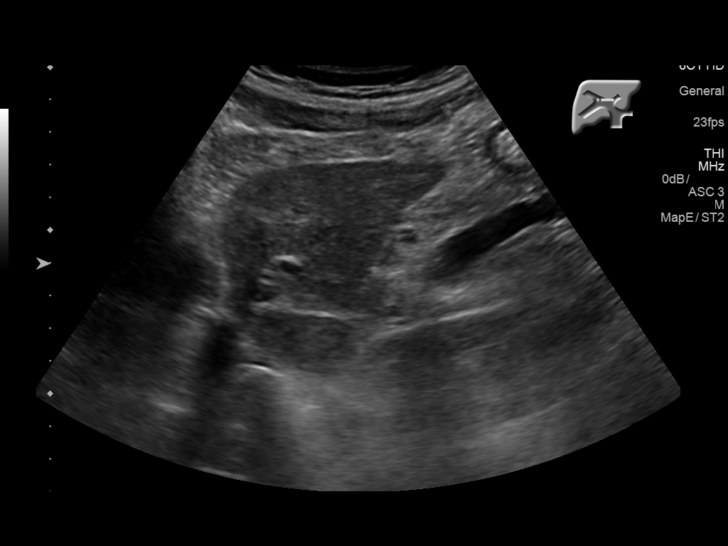
[im 105/228]
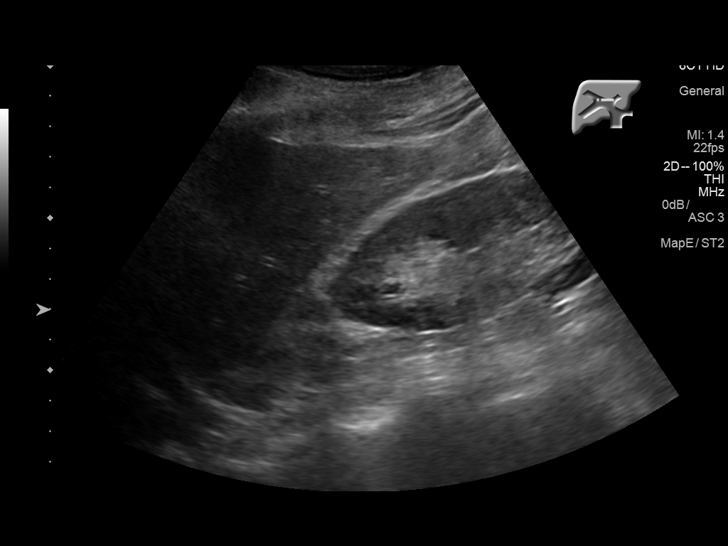
[im 123/228]
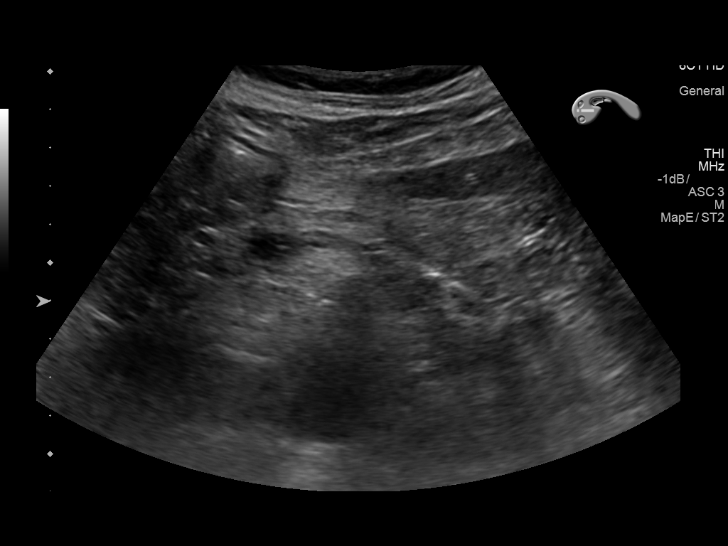
[im 142/228]
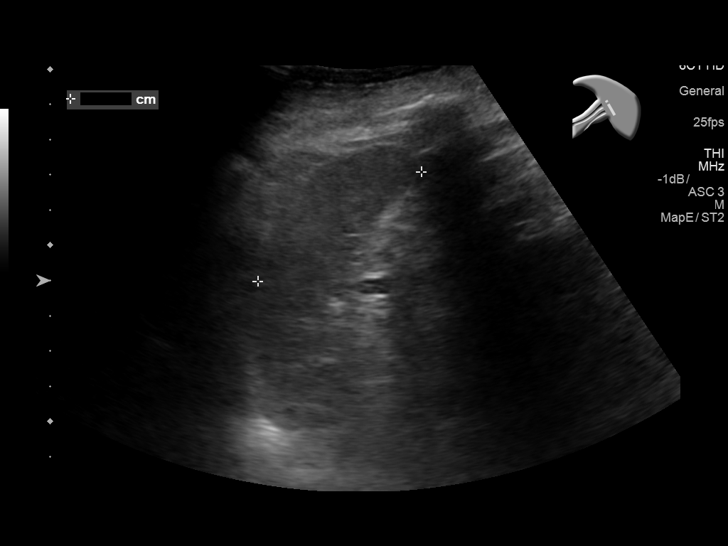
[im 152/228]
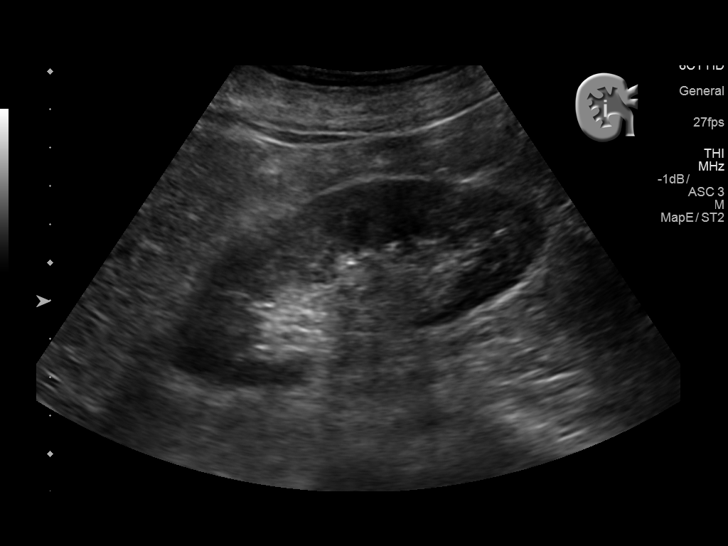
[im 171/228]
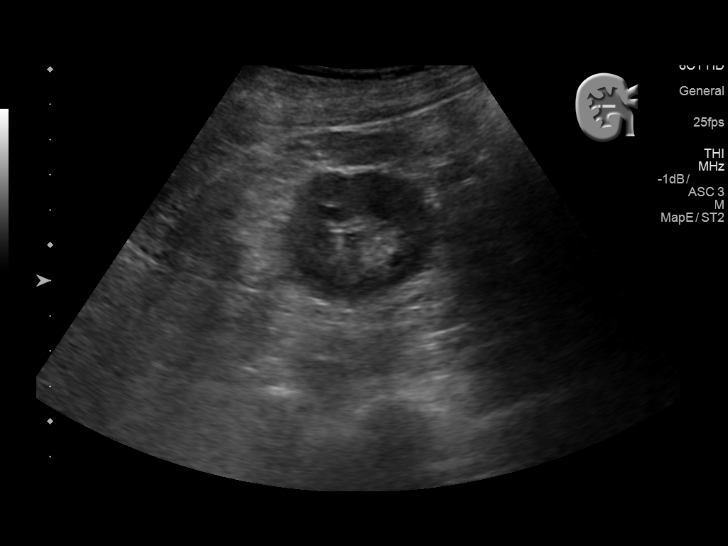
[im 190/228]
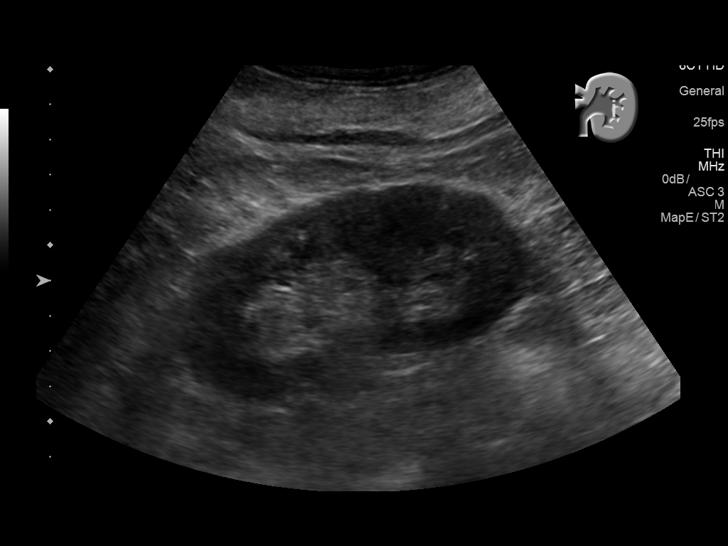
[im 209/228]
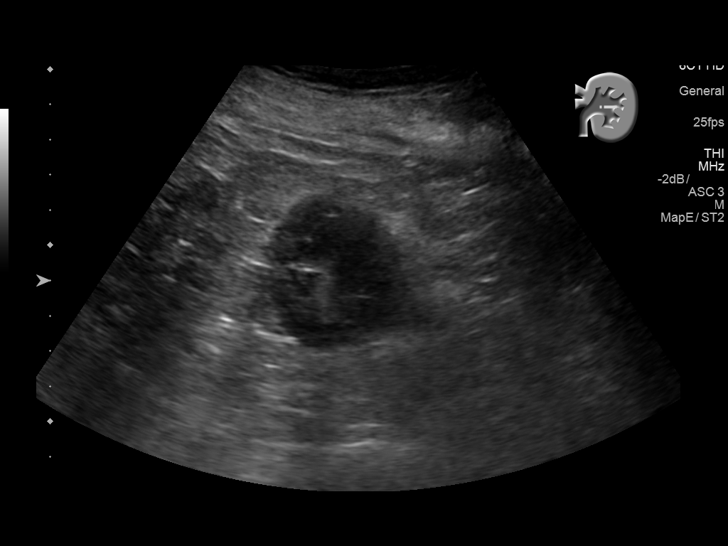
[im 228/228]
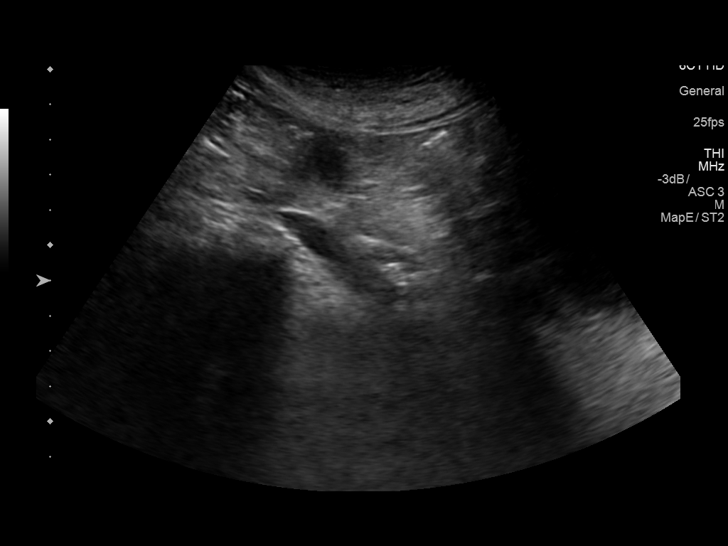

[14 of 25 positions shown; findings below may reference images not displayed]

FINDINGS: Gallbladder: No gallstones or wall thickening visualized. No
sonographic Murphy sign noted by sonographer.

Common bile duct: Diameter: 3.4 mm

Liver: No focal lesion identified. Within normal limits in
parenchymal echogenicity.

IVC: No abnormality visualized.

Pancreas: Visualized portion unremarkable.

Spleen: Size and appearance within normal limits.

Right Kidney: Length: 10.2 cm. Echogenicity within normal limits. No
mass or hydronephrosis visualized.

Left Kidney: Length: 9.6 cm. Echogenicity within normal limits. No
mass or hydronephrosis visualized.

Abdominal aorta: No aneurysm visualized.

Other findings: Small right pleural effusion.
IMPRESSION: 1. No cholelithiasis or sonographic evidence of acute cholecystitis.
2. No obstructive uropathy.

## 2018-11-07 ENCOUNTER — Inpatient Hospital Stay (HOSPITAL_COMMUNITY)
Admission: EM | Admit: 2018-11-07 | Discharge: 2018-11-16 | DRG: 329 | Disposition: A | Discharge status: Skilled Nursing Facility | Payer: Medicare Other | Attending: Internal Medicine | Admitting: Internal Medicine

## 2018-11-07 ENCOUNTER — Inpatient Hospital Stay (HOSPITAL_COMMUNITY): Payer: Medicare Other | Admitting: Certified Registered Nurse Anesthetist

## 2018-11-07 ENCOUNTER — Emergency Department (HOSPITAL_COMMUNITY): Payer: Medicare Other

## 2018-11-07 ENCOUNTER — Encounter (HOSPITAL_COMMUNITY)
Admission: EM | Disposition: A | Discharge status: Skilled Nursing Facility | Payer: Self-pay | Source: Home / Self Care | Attending: Internal Medicine

## 2018-11-07 ENCOUNTER — Encounter (HOSPITAL_COMMUNITY): Payer: Self-pay

## 2018-11-07 ENCOUNTER — Other Ambulatory Visit: Payer: Self-pay

## 2018-11-07 DIAGNOSIS — E876 Hypokalemia: Secondary | ICD-10-CM

## 2018-11-07 DIAGNOSIS — F10231 Alcohol dependence with withdrawal delirium: Secondary | ICD-10-CM | POA: Diagnosis not present

## 2018-11-07 DIAGNOSIS — Z9079 Acquired absence of other genital organ(s): Secondary | ICD-10-CM

## 2018-11-07 DIAGNOSIS — F10239 Alcohol dependence with withdrawal, unspecified: Secondary | ICD-10-CM | POA: Diagnosis present

## 2018-11-07 DIAGNOSIS — Z888 Allergy status to other drugs, medicaments and biological substances status: Secondary | ICD-10-CM

## 2018-11-07 DIAGNOSIS — J9601 Acute respiratory failure with hypoxia: Secondary | ICD-10-CM

## 2018-11-07 DIAGNOSIS — J432 Centrilobular emphysema: Secondary | ICD-10-CM | POA: Diagnosis present

## 2018-11-07 DIAGNOSIS — K255 Chronic or unspecified gastric ulcer with perforation: Principal | ICD-10-CM

## 2018-11-07 DIAGNOSIS — L89302 Pressure ulcer of unspecified buttock, stage 2: Secondary | ICD-10-CM | POA: Diagnosis not present

## 2018-11-07 DIAGNOSIS — R100 Acute abdomen: Secondary | ICD-10-CM | POA: Diagnosis not present

## 2018-11-07 DIAGNOSIS — K251 Acute gastric ulcer with perforation: Secondary | ICD-10-CM | POA: Diagnosis not present

## 2018-11-07 DIAGNOSIS — Z7982 Long term (current) use of aspirin: Secondary | ICD-10-CM | POA: Diagnosis not present

## 2018-11-07 DIAGNOSIS — I2510 Atherosclerotic heart disease of native coronary artery without angina pectoris: Secondary | ICD-10-CM | POA: Diagnosis present

## 2018-11-07 DIAGNOSIS — D638 Anemia in other chronic diseases classified elsewhere: Secondary | ICD-10-CM | POA: Diagnosis present

## 2018-11-07 DIAGNOSIS — I10 Essential (primary) hypertension: Secondary | ICD-10-CM | POA: Diagnosis present

## 2018-11-07 DIAGNOSIS — Z5331 Laparoscopic surgical procedure converted to open procedure: Secondary | ICD-10-CM

## 2018-11-07 DIAGNOSIS — Z8049 Family history of malignant neoplasm of other genital organs: Secondary | ICD-10-CM

## 2018-11-07 DIAGNOSIS — E1151 Type 2 diabetes mellitus with diabetic peripheral angiopathy without gangrene: Secondary | ICD-10-CM | POA: Diagnosis present

## 2018-11-07 DIAGNOSIS — K265 Chronic or unspecified duodenal ulcer with perforation: Secondary | ICD-10-CM | POA: Diagnosis present

## 2018-11-07 DIAGNOSIS — I739 Peripheral vascular disease, unspecified: Secondary | ICD-10-CM | POA: Diagnosis not present

## 2018-11-07 DIAGNOSIS — E872 Acidosis: Secondary | ICD-10-CM | POA: Diagnosis present

## 2018-11-07 DIAGNOSIS — L89152 Pressure ulcer of sacral region, stage 2: Secondary | ICD-10-CM | POA: Diagnosis present

## 2018-11-07 DIAGNOSIS — Z955 Presence of coronary angioplasty implant and graft: Secondary | ICD-10-CM

## 2018-11-07 DIAGNOSIS — E860 Dehydration: Secondary | ICD-10-CM | POA: Diagnosis present

## 2018-11-07 DIAGNOSIS — Z20828 Contact with and (suspected) exposure to other viral communicable diseases: Secondary | ICD-10-CM | POA: Diagnosis present

## 2018-11-07 DIAGNOSIS — F1010 Alcohol abuse, uncomplicated: Secondary | ICD-10-CM | POA: Diagnosis not present

## 2018-11-07 DIAGNOSIS — R188 Other ascites: Secondary | ICD-10-CM | POA: Diagnosis present

## 2018-11-07 DIAGNOSIS — Z87891 Personal history of nicotine dependence: Secondary | ICD-10-CM

## 2018-11-07 DIAGNOSIS — D6489 Other specified anemias: Secondary | ICD-10-CM | POA: Diagnosis present

## 2018-11-07 DIAGNOSIS — L120 Bullous pemphigoid: Secondary | ICD-10-CM | POA: Diagnosis present

## 2018-11-07 DIAGNOSIS — Z8546 Personal history of malignant neoplasm of prostate: Secondary | ICD-10-CM

## 2018-11-07 DIAGNOSIS — E1165 Type 2 diabetes mellitus with hyperglycemia: Secondary | ICD-10-CM | POA: Diagnosis present

## 2018-11-07 DIAGNOSIS — N179 Acute kidney failure, unspecified: Secondary | ICD-10-CM | POA: Diagnosis not present

## 2018-11-07 DIAGNOSIS — R109 Unspecified abdominal pain: Secondary | ICD-10-CM | POA: Diagnosis present

## 2018-11-07 DIAGNOSIS — Z79899 Other long term (current) drug therapy: Secondary | ICD-10-CM

## 2018-11-07 DIAGNOSIS — Z791 Long term (current) use of non-steroidal anti-inflammatories (NSAID): Secondary | ICD-10-CM

## 2018-11-07 DIAGNOSIS — Z803 Family history of malignant neoplasm of breast: Secondary | ICD-10-CM

## 2018-11-07 DIAGNOSIS — E785 Hyperlipidemia, unspecified: Secondary | ICD-10-CM | POA: Diagnosis present

## 2018-11-07 DIAGNOSIS — R001 Bradycardia, unspecified: Secondary | ICD-10-CM

## 2018-11-07 DIAGNOSIS — L8990 Pressure ulcer of unspecified site, unspecified stage: Secondary | ICD-10-CM

## 2018-11-07 DIAGNOSIS — Z7952 Long term (current) use of systemic steroids: Secondary | ICD-10-CM

## 2018-11-07 DIAGNOSIS — K631 Perforation of intestine (nontraumatic): Secondary | ICD-10-CM

## 2018-11-07 DIAGNOSIS — R918 Other nonspecific abnormal finding of lung field: Secondary | ICD-10-CM

## 2018-11-07 HISTORY — PX: LAPAROTOMY: SHX154

## 2018-11-07 HISTORY — PX: LAPAROSCOPY: SHX197

## 2018-11-07 LAB — CBC
HCT: 25.5 % — ABNORMAL LOW (ref 39.0–52.0)
Hemoglobin: 8.1 g/dL — ABNORMAL LOW (ref 13.0–17.0)
MCH: 29.9 pg (ref 26.0–34.0)
MCHC: 31.8 g/dL (ref 30.0–36.0)
MCV: 94.1 fL (ref 80.0–100.0)
Platelets: 245 10*3/uL (ref 150–400)
RBC: 2.71 MIL/uL — ABNORMAL LOW (ref 4.22–5.81)
RDW: 13.2 % (ref 11.5–15.5)
WBC: 7.6 10*3/uL (ref 4.0–10.5)
nRBC: 0 % (ref 0.0–0.2)

## 2018-11-07 LAB — CBG MONITORING, ED: Glucose-Capillary: 216 mg/dL — ABNORMAL HIGH (ref 70–99)

## 2018-11-07 LAB — TYPE AND SCREEN
ABO/RH(D): A POS
Antibody Screen: NEGATIVE

## 2018-11-07 LAB — PROTIME-INR
INR: 1.3 — ABNORMAL HIGH (ref 0.8–1.2)
Prothrombin Time: 16.2 seconds — ABNORMAL HIGH (ref 11.4–15.2)

## 2018-11-07 LAB — GLUCOSE, CAPILLARY
Glucose-Capillary: 115 mg/dL — ABNORMAL HIGH (ref 70–99)
Glucose-Capillary: 94 mg/dL (ref 70–99)
Glucose-Capillary: 96 mg/dL (ref 70–99)

## 2018-11-07 LAB — COMPREHENSIVE METABOLIC PANEL
ALT: 10 U/L (ref 0–44)
AST: 18 U/L (ref 15–41)
Albumin: 1.1 g/dL — ABNORMAL LOW (ref 3.5–5.0)
Alkaline Phosphatase: 56 U/L (ref 38–126)
Anion gap: 5 (ref 5–15)
BUN: 17 mg/dL (ref 8–23)
CO2: 11 mmol/L — ABNORMAL LOW (ref 22–32)
Calcium: 4.2 mg/dL — CL (ref 8.9–10.3)
Chloride: 115 mmol/L — ABNORMAL HIGH (ref 98–111)
Creatinine, Ser: 1.03 mg/dL (ref 0.61–1.24)
GFR calc Af Amer: >60 mL/min (ref >60)
GFR calc non Af Amer: >60 mL/min (ref >60)
Glucose, Bld: 61 mg/dL — ABNORMAL LOW (ref 70–99)
Potassium: 3 mmol/L — ABNORMAL LOW (ref 3.5–5.1)
Sodium: 131 mmol/L — ABNORMAL LOW (ref 135–145)
Total Bilirubin: 0.3 mg/dL (ref 0.3–1.2)
Total Protein: <3 g/dL — ABNORMAL LOW (ref 6.5–8.1)

## 2018-11-07 LAB — ETHANOL: Alcohol, Ethyl (B): <10 mg/dL (ref <10)

## 2018-11-07 LAB — BLOOD GAS, VENOUS

## 2018-11-07 LAB — LACTIC ACID, PLASMA: Lactic Acid, Venous: 1.5 mmol/L (ref 0.5–1.9)

## 2018-11-07 LAB — TROPONIN I: Troponin I: <0.03 ng/mL (ref <0.03)

## 2018-11-07 LAB — LIPASE, BLOOD: Lipase: 17 U/L (ref 11–51)

## 2018-11-07 LAB — SARS CORONAVIRUS 2 BY RT PCR (HOSPITAL ORDER, PERFORMED IN ~~LOC~~ HOSPITAL LAB): SARS Coronavirus 2: NEGATIVE

## 2018-11-07 LAB — MAGNESIUM: Magnesium: 1 mg/dL — ABNORMAL LOW (ref 1.7–2.4)

## 2018-11-07 SURGERY — LAPAROSCOPY, DIAGNOSTIC
Anesthesia: General | Site: Abdomen

## 2018-11-07 MED ORDER — MAGNESIUM SULFATE 2 GM/50ML IV SOLN
2.0000 g | Freq: Once | INTRAVENOUS | Status: AC
  Administered 2018-11-07: 2 g via INTRAVENOUS
  Filled 2018-11-07: qty 50

## 2018-11-07 MED ORDER — FENTANYL CITRATE (PF) 100 MCG/2ML IJ SOLN
INTRAMUSCULAR | Status: DC | PRN
  Administered 2018-11-07: 50 ug via INTRAVENOUS
  Administered 2018-11-07: 100 ug via INTRAVENOUS
  Administered 2018-11-07: 50 ug via INTRAVENOUS

## 2018-11-07 MED ORDER — LIDOCAINE 2% (20 MG/ML) 5 ML SYRINGE
INTRAMUSCULAR | Status: AC
  Filled 2018-11-07: qty 5

## 2018-11-07 MED ORDER — PIPERACILLIN-TAZOBACTAM 3.375 G IVPB 30 MIN
3.3750 g | Freq: Once | INTRAVENOUS | Status: AC
  Administered 2018-11-07: 3.375 g via INTRAVENOUS
  Filled 2018-11-07: qty 50

## 2018-11-07 MED ORDER — SUGAMMADEX SODIUM 200 MG/2ML IV SOLN
INTRAVENOUS | Status: AC
  Filled 2018-11-07: qty 2

## 2018-11-07 MED ORDER — PROMETHAZINE HCL 25 MG/ML IJ SOLN: 6.2500 mg | INTRAMUSCULAR | Status: DC | PRN

## 2018-11-07 MED ORDER — PROPOFOL 10 MG/ML IV BOLUS
INTRAVENOUS | Status: AC
  Filled 2018-11-07: qty 20

## 2018-11-07 MED ORDER — SUCCINYLCHOLINE CHLORIDE 200 MG/10ML IV SOSY
PREFILLED_SYRINGE | INTRAVENOUS | Status: AC
  Filled 2018-11-07: qty 10

## 2018-11-07 MED ORDER — SODIUM CHLORIDE 0.9 % IV SOLN
1.0000 g | Freq: Once | INTRAVENOUS | Status: DC
  Filled 2018-11-07: qty 10

## 2018-11-07 MED ORDER — SODIUM CHLORIDE 0.9 % IV SOLN
INTRAVENOUS | Status: DC | PRN
  Administered 2018-11-07: 17:00:00 via INTRAVENOUS

## 2018-11-07 MED ORDER — ACETAMINOPHEN 10 MG/ML IV SOLN
INTRAVENOUS | Status: AC
  Filled 2018-11-07: qty 100

## 2018-11-07 MED ORDER — HYDROCORTISONE NA SUCCINATE PF 100 MG IJ SOLR
INTRAMUSCULAR | Status: AC
  Filled 2018-11-07: qty 2

## 2018-11-07 MED ORDER — SODIUM CHLORIDE 0.9 % IV SOLN
INTRAVENOUS | Status: DC | PRN
  Administered 2018-11-07: 80 ug/min via INTRAVENOUS

## 2018-11-07 MED ORDER — DIPHENHYDRAMINE HCL 50 MG/ML IJ SOLN
INTRAMUSCULAR | Status: AC
  Filled 2018-11-07: qty 1

## 2018-11-07 MED ORDER — PIPERACILLIN-TAZOBACTAM 3.375 G IVPB
3.3750 g | Freq: Three times a day (TID) | INTRAVENOUS | Status: DC
  Administered 2018-11-08 – 2018-11-14 (×20): 3.375 g via INTRAVENOUS
  Filled 2018-11-07 (×19): qty 50

## 2018-11-07 MED ORDER — INSULIN ASPART 100 UNIT/ML ~~LOC~~ SOLN
2.0000 [IU] | SUBCUTANEOUS | Status: DC
  Administered 2018-11-08 (×2): 2 [IU] via SUBCUTANEOUS
  Administered 2018-11-09: 4 [IU] via SUBCUTANEOUS
  Administered 2018-11-09 – 2018-11-10 (×2): 2 [IU] via SUBCUTANEOUS

## 2018-11-07 MED ORDER — BUPIVACAINE-EPINEPHRINE 0.25% -1:200000 IJ SOLN
INTRAMUSCULAR | Status: DC | PRN
  Administered 2018-11-07: 8 mL

## 2018-11-07 MED ORDER — SODIUM CHLORIDE 0.9 % IV SOLN: INTRAVENOUS | Status: DC

## 2018-11-07 MED ORDER — 0.9 % SODIUM CHLORIDE (POUR BTL) OPTIME
TOPICAL | Status: DC | PRN
  Administered 2018-11-07: 18:00:00 4000 mL

## 2018-11-07 MED ORDER — DIPHENHYDRAMINE HCL 50 MG/ML IJ SOLN
INTRAMUSCULAR | Status: DC | PRN
  Administered 2018-11-07: 12.5 mg via INTRAVENOUS

## 2018-11-07 MED ORDER — SODIUM CHLORIDE 0.9 % IV BOLUS
2000.0000 mL | Freq: Once | INTRAVENOUS | Status: AC
  Administered 2018-11-07: 2000 mL via INTRAVENOUS

## 2018-11-07 MED ORDER — HYDROMORPHONE HCL 1 MG/ML IJ SOLN: 0.2500 mg | INTRAMUSCULAR | Status: DC | PRN

## 2018-11-07 MED ORDER — KCL-LACTATED RINGERS-D5W 20 MEQ/L IV SOLN
INTRAVENOUS | Status: DC
  Administered 2018-11-07 – 2018-11-10 (×7): via INTRAVENOUS
  Filled 2018-11-07 (×10): qty 1000

## 2018-11-07 MED ORDER — LACTATED RINGERS IV SOLN
INTRAVENOUS | Status: AC | PRN
  Administered 2018-11-07: 1000 mL

## 2018-11-07 MED ORDER — SODIUM CHLORIDE (PF) 0.9 % IJ SOLN
INTRAMUSCULAR | Status: AC
  Filled 2018-11-07: qty 50

## 2018-11-07 MED ORDER — SODIUM BICARBONATE 8.4 % IV SOLN
50.0000 meq | Freq: Once | INTRAVENOUS | Status: AC
  Administered 2018-11-07: 50 meq via INTRAVENOUS
  Filled 2018-11-07: qty 50

## 2018-11-07 MED ORDER — METOPROLOL TARTRATE 5 MG/5ML IV SOLN
INTRAVENOUS | Status: DC | PRN
  Administered 2018-11-07: 2 mg via INTRAVENOUS

## 2018-11-07 MED ORDER — SODIUM CHLORIDE 0.9% FLUSH
3.0000 mL | Freq: Once | INTRAVENOUS | Status: AC
  Administered 2018-11-07: 3 mL via INTRAVENOUS

## 2018-11-07 MED ORDER — LIDOCAINE 2% (20 MG/ML) 5 ML SYRINGE
INTRAMUSCULAR | Status: DC | PRN
  Administered 2018-11-07: 60 mg via INTRAVENOUS

## 2018-11-07 MED ORDER — ONDANSETRON HCL 4 MG/2ML IJ SOLN
INTRAMUSCULAR | Status: AC
  Filled 2018-11-07: qty 2

## 2018-11-07 MED ORDER — BUPIVACAINE-EPINEPHRINE (PF) 0.25% -1:200000 IJ SOLN
INTRAMUSCULAR | Status: AC
  Filled 2018-11-07: qty 30

## 2018-11-07 MED ORDER — HYDROCORTISONE NA SUCCINATE PF 100 MG IJ SOLR
INTRAMUSCULAR | Status: DC | PRN
  Administered 2018-11-07: 100 mg via INTRAVENOUS

## 2018-11-07 MED ORDER — ROCURONIUM BROMIDE 50 MG/5ML IV SOSY
PREFILLED_SYRINGE | INTRAVENOUS | Status: DC | PRN
  Administered 2018-11-07: 40 mg via INTRAVENOUS

## 2018-11-07 MED ORDER — CALCIUM CARBONATE ANTACID 500 MG PO CHEW
800.0000 mg | CHEWABLE_TABLET | Freq: Once | ORAL | Status: DC
  Filled 2018-11-07: qty 4

## 2018-11-07 MED ORDER — ACETAMINOPHEN 10 MG/ML IV SOLN
INTRAVENOUS | Status: DC | PRN
  Administered 2018-11-07: 1000 mg via INTRAVENOUS

## 2018-11-07 MED ORDER — SODIUM CHLORIDE 0.9 % IV BOLUS
1000.0000 mL | Freq: Once | INTRAVENOUS | Status: AC
  Administered 2018-11-07: 1000 mL via INTRAVENOUS

## 2018-11-07 MED ORDER — FENTANYL CITRATE (PF) 100 MCG/2ML IJ SOLN: 50.0000 ug | Freq: Once | INTRAMUSCULAR | Status: DC

## 2018-11-07 MED ORDER — IOHEXOL 350 MG/ML SOLN
100.0000 mL | Freq: Once | INTRAVENOUS | Status: AC | PRN
  Administered 2018-11-07: 100 mL via INTRAVENOUS

## 2018-11-07 MED ORDER — PHENYLEPHRINE HCL (PRESSORS) 10 MG/ML IV SOLN
INTRAVENOUS | Status: AC
  Filled 2018-11-07: qty 1

## 2018-11-07 MED ORDER — ROCURONIUM BROMIDE 10 MG/ML (PF) SYRINGE
PREFILLED_SYRINGE | INTRAVENOUS | Status: AC
  Filled 2018-11-07: qty 10

## 2018-11-07 MED ORDER — FENTANYL CITRATE (PF) 250 MCG/5ML IJ SOLN
INTRAMUSCULAR | Status: AC
  Filled 2018-11-07: qty 5

## 2018-11-07 MED ORDER — SUCCINYLCHOLINE CHLORIDE 200 MG/10ML IV SOSY
PREFILLED_SYRINGE | INTRAVENOUS | Status: DC | PRN
  Administered 2018-11-07: 100 mg via INTRAVENOUS

## 2018-11-07 MED ORDER — IPRATROPIUM-ALBUTEROL 0.5-2.5 (3) MG/3ML IN SOLN: 3.0000 mL | RESPIRATORY_TRACT | Status: DC | PRN

## 2018-11-07 MED ORDER — CALCIUM GLUCONATE-NACL 1-0.675 GM/50ML-% IV SOLN
1.0000 g | Freq: Once | INTRAVENOUS | Status: AC
  Administered 2018-11-07: 1000 mg via INTRAVENOUS
  Filled 2018-11-07: qty 50

## 2018-11-07 MED ORDER — LATANOPROST 0.005 % OP SOLN
1.0000 [drp] | Freq: Every day | OPHTHALMIC | Status: DC
  Administered 2018-11-08 – 2018-11-15 (×7): 1 [drp] via OPHTHALMIC
  Filled 2018-11-07 (×2): qty 2.5

## 2018-11-07 MED ORDER — PROPOFOL 10 MG/ML IV BOLUS
INTRAVENOUS | Status: DC | PRN
  Administered 2018-11-07: 100 mg via INTRAVENOUS

## 2018-11-07 MED ORDER — FLUCONAZOLE IN SODIUM CHLORIDE 200-0.9 MG/100ML-% IV SOLN
200.0000 mg | Freq: Every day | INTRAVENOUS | Status: DC
  Administered 2018-11-07 – 2018-11-11 (×5): 200 mg via INTRAVENOUS
  Filled 2018-11-07 (×6): qty 100

## 2018-11-07 MED ORDER — POTASSIUM CHLORIDE 10 MEQ/100ML IV SOLN
10.0000 meq | Freq: Once | INTRAVENOUS | Status: AC
  Administered 2018-11-07: 10 meq via INTRAVENOUS
  Filled 2018-11-07: qty 100

## 2018-11-07 MED ORDER — IOHEXOL 300 MG/ML  SOLN: 100.0000 mL | Freq: Once | INTRAMUSCULAR | Status: DC | PRN

## 2018-11-07 MED ORDER — ALBUMIN HUMAN 5 % IV SOLN
INTRAVENOUS | Status: DC | PRN
  Administered 2018-11-07 (×2): via INTRAVENOUS

## 2018-11-07 MED ORDER — EPHEDRINE SULFATE-NACL 50-0.9 MG/10ML-% IV SOSY
PREFILLED_SYRINGE | INTRAVENOUS | Status: DC | PRN
  Administered 2018-11-07 (×2): 15 mg via INTRAVENOUS

## 2018-11-07 SURGICAL SUPPLY — 54 items
BLADE CLIPPER SURG (BLADE) IMPLANT
BLADE EXTENDED COATED 6.5IN (ELECTRODE) IMPLANT
CABLE HIGH FREQUENCY MONO STRZ (ELECTRODE) ×4 IMPLANT
CHLORAPREP W/TINT 26 (MISCELLANEOUS) ×4 IMPLANT
COVER WAND RF STERILE (DRAPES) IMPLANT
DECANTER SPIKE VIAL GLASS SM (MISCELLANEOUS) IMPLANT
DERMABOND ADVANCED (GAUZE/BANDAGES/DRESSINGS) ×2
DERMABOND ADVANCED .7 DNX12 (GAUZE/BANDAGES/DRESSINGS) ×2 IMPLANT
DRAIN CHANNEL 19F RND (DRAIN) ×4 IMPLANT
DRAPE LAPAROSCOPIC ABDOMINAL (DRAPES) ×4 IMPLANT
DRAPE WARM FLUID 44X44 (DRAPES) ×4 IMPLANT
DRSG OPSITE POSTOP 4X8 (GAUZE/BANDAGES/DRESSINGS) ×4 IMPLANT
DRSG TEGADERM 2-3/8X2-3/4 SM (GAUZE/BANDAGES/DRESSINGS) ×4 IMPLANT
DRSG TEGADERM 4X4.75 (GAUZE/BANDAGES/DRESSINGS) ×4 IMPLANT
ELECT REM PT RETURN 15FT ADLT (MISCELLANEOUS) ×4 IMPLANT
EVACUATOR SILICONE 100CC (DRAIN) ×4 IMPLANT
GAUZE SPONGE 2X2 8PLY STRL LF (GAUZE/BANDAGES/DRESSINGS) IMPLANT
GAUZE SPONGE 4X4 12PLY STRL (GAUZE/BANDAGES/DRESSINGS) ×4 IMPLANT
GLOVE BIO SURGEON STRL SZ7.5 (GLOVE) ×4 IMPLANT
GOWN STRL REUS W/TWL LRG LVL3 (GOWN DISPOSABLE) ×12 IMPLANT
GOWN STRL REUS W/TWL XL LVL3 (GOWN DISPOSABLE) ×8 IMPLANT
HANDLE SUCTION POOLE (INSTRUMENTS) ×2 IMPLANT
IRRIG SUCT STRYKERFLOW 2 WTIP (MISCELLANEOUS) ×4
IRRIGATION SUCT STRKRFLW 2 WTP (MISCELLANEOUS) ×2 IMPLANT
KIT BASIN OR (CUSTOM PROCEDURE TRAY) ×4 IMPLANT
KIT TURNOVER KIT A (KITS) IMPLANT
MARKER SKIN DUAL TIP RULER LAB (MISCELLANEOUS) IMPLANT
NEEDLE INSUFFLATION 14GA 120MM (NEEDLE) ×4 IMPLANT
NS IRRIG 1000ML POUR BTL (IV SOLUTION) ×8 IMPLANT
PACK GENERAL/GYN (CUSTOM PROCEDURE TRAY) ×4 IMPLANT
PROTECTOR NERVE ULNAR (MISCELLANEOUS) ×8 IMPLANT
SCISSORS LAP 5X35 DISP (ENDOMECHANICALS) ×4 IMPLANT
SHEARS HARMONIC ACE PLUS 36CM (ENDOMECHANICALS) IMPLANT
SLEEVE XCEL OPT CAN 5 100 (ENDOMECHANICALS) ×8 IMPLANT
SOLUTION ANTI FOG 6CC (MISCELLANEOUS) ×4 IMPLANT
SPECIMEN JAR LARGE (MISCELLANEOUS) IMPLANT
SPONGE GAUZE 2X2 STER 10/PKG (GAUZE/BANDAGES/DRESSINGS)
SPONGE LAP 18X18 RF (DISPOSABLE) IMPLANT
STAPLER VISISTAT 35W (STAPLE) ×4 IMPLANT
SUCTION POOLE HANDLE (INSTRUMENTS) ×4
SUT PDS AB 1 TP1 96 (SUTURE) ×8 IMPLANT
SUT SILK 2 0 SH CR/8 (SUTURE) IMPLANT
SUT SILK 2 0 TIES 10X30 (SUTURE) IMPLANT
SUT SILK 3 0 SH CR/8 (SUTURE) IMPLANT
SUT SILK 3 0 TIES 10X30 (SUTURE) IMPLANT
SUT VIC AB 4-0 PS2 27 (SUTURE) IMPLANT
TOWEL OR 17X26 10 PK STRL BLUE (TOWEL DISPOSABLE) ×8 IMPLANT
TOWEL OR NON WOVEN STRL DISP B (DISPOSABLE) ×4 IMPLANT
TRAY FOLEY MTR SLVR 16FR STAT (SET/KITS/TRAYS/PACK) ×4 IMPLANT
TRAY LAPAROSCOPIC (CUSTOM PROCEDURE TRAY) IMPLANT
TROCAR BLADELESS OPT 5 100 (ENDOMECHANICALS) ×4 IMPLANT
TROCAR XCEL BLUNT TIP 100MML (ENDOMECHANICALS) IMPLANT
TROCAR XCEL NON-BLD 11X100MML (ENDOMECHANICALS) IMPLANT
YANKAUER SUCT BULB TIP NO VENT (SUCTIONS) ×4 IMPLANT

## 2018-11-07 NOTE — Anesthesia Postprocedure Evaluation (Signed)
Anesthesia Post Note  Patient: DALAN COWGER  Procedure(s) Performed: LAPAROSCOPY DIAGNOSTIC (N/A ) EXPLORATORY LAPAROTOMY gram patch gastric ulcer (N/A Abdomen)     Patient location during evaluation: PACU Anesthesia Type: General Level of consciousness: sedated Pain management: pain level controlled Vital Signs Assessment: post-procedure vital signs reviewed and stable Respiratory status: spontaneous breathing and respiratory function stable Cardiovascular status: stable Postop Assessment: no apparent nausea or vomiting Anesthetic complications: no    Last Vitals:  Vitals:   11/07/18 1930 11/07/18 2015  BP: 103/64 105/66  Pulse: 61 64  Resp: 18 16  Temp: 36.6 C (!) 36.2 C  SpO2: 100% 100%    Last Pain:  Vitals:   11/07/18 2015  TempSrc:   PainSc: [place]

## 2018-11-07 NOTE — Anesthesia Procedure Notes (Signed)
Procedure Name: Intubation Date/Time: 11/07/2018 4:57 PM Performed by: Montel Clock, CRNA Pre-anesthesia Checklist: Patient identified, Emergency Drugs available, Suction available, Patient being monitored and Timeout performed Patient Re-evaluated:Patient Re-evaluated prior to induction Oxygen Delivery Method: Circle system utilized Preoxygenation: Pre-oxygenation with 100% oxygen Induction Type: IV induction, Rapid sequence and Cricoid Pressure applied Laryngoscope Size: Mac and 3 Grade View: Grade II Tube type: Oral Tube size: 7.5 mm Number of attempts: 1 Airway Equipment and Method: Stylet Placement Confirmation: ETT inserted through vocal cords under direct vision,  positive ETCO2 and breath sounds checked- equal and bilateral Secured at: 23 cm Tube secured with: Tape Dental Injury: Teeth and Oropharynx as per pre-operative assessment

## 2018-11-07 NOTE — Op Note (Signed)
11/07/2018  6:03 PM  PATIENT:  Rodney [name]  68 y.o. male  PRE-OPERATIVE DIAGNOSIS:  perforated viscus  POST-OPERATIVE DIAGNOSIS: Perforated posterior gastric ulcer  PROCEDURE:  Procedure(s): Emergency LAPAROSCOPY DIAGNOSTIC (N/A) EXPLORATORY LAPAROTOMY and Graham patch repair of perforated posterior gastric ulcer (N/A)  SURGEON:  Surgeon(s) and Role:    Ralene Ok, MD - Primary  ANESTHESIA:   local and general  EBL:  50 mL   BLOOD ADMINISTERED:none  DRAINS: (65 [place]) Jackson-Pratt drain(s) with closed bulb suction in the lesser sac  LOCAL MEDICATIONS USED:  BUPIVICAINE   SPECIMEN:  No Specimen  DISPOSITION OF SPECIMEN:  N/A  COUNTS:  YES  TOURNIQUET:  * No tourniquets in log *  DICTATION: .Dragon Dictation Indicates procedure: Patient is a 68 year old male with perforated viscus on CT scan.  Patient had peritonitis and was taken to the operating room urgently for diagnostic laparoscopy.  Findings: Patient had large amount of gastric fluid within his abdominal cavity.  Patient had a posterior gastric ulcer in the antrum.  This was repaired using a tongue of omentum and a Graham patch-like fashion.  Details of procedure: After the patient was consented he was taken back to the OR and placed in the supine position with bilateral SCDs placed.  He underwent general trach intubation.  A Foley catheter was placed.  He was prepped and draped in standard fashion.  Time was called all facts verified.  At this time a Veress needle technique was used to insufflate the abdomen 15 mmHg in the right lower quadrant.  Subsequent to this a 5 mm trocar and camera were then placed intra-abdominally.  There was no injury to any intra-abdominal organs.  2 5 mm trochars were then placed in the infra umbilical midline as well as the left lower quadrant.  At this time patient was positioned.  There was large amount of gastric ascites within the abdominal cavity.  The area of concern  appeared to be in the antrum of the stomach.  I cannot visualize any anterior perforations.  Secondary to the difficulty with the patient's body habitus I decided to convert to a laparotomy.  #10.  Blade was used to make an Upper midline incision.  Cautery was used to maintain hemostasis and dissection was taken down to the midline fascia.  This was incised.  The peritoneum was entered bluntly.  The fascia was then extended to the length of the skin incision.  At this time a Balfour retractor was then placed.  The stomach was grasped.  The gastrocolic ligament was incised.  This allowed entry into the lesser sac.  At this time it could be palpated and there was a posterior gastric ulcer in the antrum of the stomach.  This appeared clean based.  At this time 0 silk were used to reapproximate the perforation.  A tongue of omentum was then brought up to the wall and this was sutured in a Graham patch-like fashion.  At this time the abdominal cavity was irrigated out with sterile saline.  A 19 [place] Blake drain was then placed in the lesser sac and brought out to the left upper quadrant stab incision was placed in the abdominal wall using a 2-0 nylon x1.  At this time the midline fascia was then reapproximated using #1 PDS in a standard running fashion x2.  The skin was loosely reapproximated.  Honeycomb dressing was placed on the midline incision.  The trocar sites were approximated using skin staples.  Patient tolerated  the procedure well was taken to the recovery in stable condition.   PLAN OF CARE: Admit to inpatient   PATIENT DISPOSITION:  ICU - extubated and stable.   Delay start of Pharmacological VTE agent (>24hrs) due to surgical blood loss or risk of bleeding: yes

## 2018-11-07 NOTE — ED Triage Notes (Signed)
Pt BIBA from home. Pt states he is dehydrated- unknown cause. Pt c/o abd pain, sharp epigastric pain radiating laterally and RLQ. Soft abdomen, tender to palp. No N/V/D, no melena. Pain is worse when laying flat. Pain started upon waking. Pt c/o dizziness as well. Pt is orthostatic 60 palp when standing Pt was mottled on EMS arrival with gray nail beds.  88% RA. 99% on 4L. Pt is being treated for a lesion on his right thigh at the wound center. Hx of fornier's gangrene. Pt has staph looking infections around body. Pt smells of ETOH use and admits to drinking more than usual.

## 2018-11-07 NOTE — ED Notes (Signed)
ED TO INPATIENT HANDOFF REPORT  Name/Age/Gender Rodney [name] 68 y.o. male  Code Status    Code Status Orders  (From admission, onward)         Start     Ordered   11/07/18 1528  Full code  Continuous     11/07/18 1528        Code Status History    Date Active Date Inactive Code Status Order ID Comments User Context   09/08/2015 1637 10/05/2015 1646 Full Code [id number]  Esmond Camper Inpatient   09/01/2015 0115 09/08/2015 1518 Full Code [id number]  Norval Morton, MD Inpatient   08/15/2015 2340 08/22/2015 2254 Full Code [id number]  Edwin Dada, MD Inpatient   08/04/2015 0108 08/04/2015 1515 Full Code [id number]  Charolette Forward, MD ED   02/17/2015 1635 02/18/2015 1710 Full Code [id number]  Ulyses Amor, PA-C Inpatient   02/16/2015 1742 02/17/2015 1625 Full Code [id number]  Ulyses Amor, PA-C Inpatient   02/09/2015 1955 02/10/2015 2042 Full Code [id number]  Angelia Mould, MD Inpatient      Home/SNF/Other Home  Chief Complaint abdominal pain  Level of Care/Admitting Diagnosis ED Disposition    ED Disposition Condition [facility] Area: [facility] [[id number]]  Level of Care: ICU [6]  Covid Evaluation: Screening Protocol (No Symptoms)  Diagnosis: Acute abdomen [269485]  Admitting Physician: Chesley Mires [[id number]]  Attending Physician: Chesley Mires [[id number]]  Estimated length of stay: 5 - 7 days  Certification:: I certify this patient will need inpatient services for at least 2 midnights  PT Class (Do Not Modify): Inpatient [101]  PT Acc Code (Do Not Modify): Private [1]       Medical History Past Medical History:  Diagnosis Date  . AKI (acute kidney injury) ([place])   . Alcohol dependence ([place])   . Benign essential HTN 09/01/2015  . Bullous pemphigoid   . CAD (coronary artery disease)   . Cataract    left eye surgery/   . Cellulitis of scrotum   . Heart murmur    history  . Hyponatremia   . Peripheral vascular disease ([place])    . Prostate cancer ([place]) 05/03/2009   Prostatectomy/Adenocrcinoma  . Radiation 07/02/11-08/22/11   Prostate fossa 68.4 gray 38 fractions  . Severe malnutrition ([place])   . Thrombocytopenia ([facility])     Allergies Allergies  Allergen Reactions  . Other Rash    polyester and metals except titanium.    IV Location/Drains/Wounds Patient Lines/Drains/Airways Status   Active Line/Drains/Airways    Name:   Placement date:   Placement time:   Site:   Days:   Peripheral IV 11/07/18 Right Antecubital   11/07/18    -    Antecubital   less than 1   Peripheral IV 11/07/18 Left Hand   11/07/18    1518    Hand   less than 1          Labs/Imaging Results for orders placed or performed during the hospital encounter of 11/07/18 (from the past 48 hour(s))  SARS Coronavirus 2 (CEPHEID- Performed in [facility] hospital lab), Hosp Order     Status: None   Collection Time: 11/07/18 11:18 AM  Result Value Ref Range   SARS Coronavirus 2 NEGATIVE NEGATIVE    Comment: (NOTE) If result is NEGATIVE SARS-CoV-2 target nucleic acids are NOT DETECTED. The SARS-CoV-2 RNA is generally detectable in upper and lower  respiratory specimens during the acute phase of infection. The  lowest  concentration of SARS-CoV-2 viral copies this assay can detect is 250  copies / mL. A negative result does not preclude SARS-CoV-2 infection  and should not be used as the sole basis for treatment or other  patient management decisions.  A negative result may occur with  improper specimen collection / handling, submission of specimen other  than nasopharyngeal swab, presence of viral mutation(s) within the  areas targeted by this assay, and inadequate number of viral copies  (<250 copies / mL). A negative result must be combined with clinical  observations, patient history, and epidemiological information. If result is POSITIVE SARS-CoV-2 target nucleic acids are DETECTED. The SARS-CoV-2 RNA is generally detectable in upper and  lower  respiratory specimens dur ing the acute phase of infection.  Positive  results are indicative of active infection with SARS-CoV-2.  Clinical  correlation with patient history and other diagnostic information is  necessary to determine patient infection status.  Positive results do  not rule out bacterial infection or co-infection with other viruses. If result is PRESUMPTIVE POSTIVE SARS-CoV-2 nucleic acids MAY BE PRESENT.   A presumptive positive result was obtained on the submitted specimen  and confirmed on repeat testing.  While 2019 novel coronavirus  (SARS-CoV-2) nucleic acids may be present in the submitted sample  additional confirmatory testing may be necessary for epidemiological  and / or clinical management purposes  to differentiate between  SARS-CoV-2 and other Sarbecovirus currently known to infect humans.  If clinically indicated additional testing with an alternate test  methodology [phone]) is advised. The SARS-CoV-2 RNA is generally  detectable in upper and lower respiratory sp ecimens during the acute  phase of infection. The expected result is Negative. Fact Sheet for Patients:  StrictlyIdeas.no Fact Sheet for Healthcare Providers: BankingDealers.co.za This test is not yet approved or cleared by the [place] FDA and has been authorized for detection and/or diagnosis of SARS-CoV-2 by FDA under an Emergency Use Authorization (EUA).  This EUA will remain in effect (meaning this test can be used) for the duration of the COVID-19 declaration under Section 564(b)(1) of the Act, 21 U.S.C. section 360bbb-3(b)(1), unless the authorization is terminated or revoked sooner. Performed at [facility], [place] [address] [place], [place] [address]   Lipase, blood     Status: None   Collection Time: 11/07/18 11:31 AM  Result Value Ref Range   Lipase 17 11 - 51 U/L    Comment: Performed at [facility], [place] [address] [place], [place] [address]  Comprehensive metabolic panel     Status: Abnormal   Collection Time: 11/07/18 11:31 AM  Result Value Ref Range   Sodium 131 (L) 135 - 145 mmol/L   Potassium 3.0 (L) 3.5 - 5.1 mmol/L   Chloride 115 (H) 98 - 111 mmol/L   CO2 11 (L) 22 - 32 mmol/L   Glucose, Bld 61 (L) 70 - 99 mg/dL   BUN 17 8 - 23 mg/dL   Creatinine, Ser 1.03 0.61 - 1.24 mg/dL   Calcium 4.2 (LL) 8.9 - 10.3 mg/dL    Comment: CRITICAL RESULT CALLED TO, READ BACK BY AND VERIFIED WITH: Rosine Abe [id number] @1225  BY V.WILKINS    Total Protein <3.0 (L) 6.5 - 8.1 g/dL   Albumin 1.1 (L) 3.5 - 5.0 g/dL   AST 18 15 - 41 U/L   ALT 10 0 - 44 U/L   Alkaline Phosphatase 56 38 - 126 U/L   Total Bilirubin 0.3 0.3 -  1.2 mg/dL   GFR calc non Af Amer >60 >60 mL/min   GFR calc Af Amer >60 >60 mL/min   Anion gap 5 5 - 15    Comment: Performed at [facility], [place] [address] [place], [place] [address]  CBC     Status: Abnormal   Collection Time: 11/07/18 11:31 AM  Result Value Ref Range   WBC 7.6 4.0 - 10.5 K/uL   RBC 2.71 (L) 4.22 - 5.81 MIL/uL   Hemoglobin 8.1 (L) 13.0 - 17.0 g/dL   HCT 25.5 (L) 39.0 - 52.0 %   MCV 94.1 80.0 - 100.0 fL   MCH 29.9 26.0 - 34.0 pg   MCHC 31.8 30.0 - 36.0 g/dL   RDW 13.2 11.5 - 15.5 %   Platelets 245 150 - 400 K/uL   nRBC 0.0 0.0 - 0.2 %    Comment: Performed at [facility], [place] [address], [place], [place] [address]  Ethanol     Status: None   Collection Time: 11/07/18 11:31 AM  Result Value Ref Range   Alcohol, Ethyl (B) <10 <10 mg/dL    Comment: (NOTE) Lowest detectable limit for serum alcohol is 10 mg/dL. For medical purposes only. Performed at [facility], [place] [address] [place], [place] [address]   Lactic acid, plasma     Status: None   Collection Time: 11/07/18 11:31 AM  Result Value Ref Range   Lactic Acid, Venous 1.5 0.5 - 1.9 mmol/L    Comment: Performed at  [facility], [place] [address] [place], [place] [address]  Troponin I - ONCE - STAT     Status: None   Collection Time: 11/07/18 11:35 AM  Result Value Ref Range   Troponin I <0.03 <0.03 ng/mL    Comment: Performed at [facility], [place] [address], [place], [place] [address]  Type and screen     Status: None   Collection Time: 11/07/18 12:04 PM  Result Value Ref Range   ABO/RH(D) A POS    Antibody Screen NEG    Sample Expiration      11/10/2018,2359 Performed at [facility], [place] [address] [place], [place] [address]   Magnesium     Status: Abnormal   Collection Time: 11/07/18 12:31 PM  Result Value Ref Range   Magnesium 1.0 (L) 1.7 - 2.4 mg/dL    Comment: Performed at [facility], [place] [address], [place], [place] [address]  Protime-INR     Status: Abnormal   Collection Time: 11/07/18  2:57 PM  Result Value Ref Range   Prothrombin Time 16.2 (H) 11.4 - 15.2 seconds   INR 1.3 (H) 0.8 - 1.2    Comment: (NOTE) INR goal varies based on device and disease states. Performed at [facility], [place] [address] [place],  [address]   Blood gas, venous     Status: Abnormal (Preliminary result)   Collection Time: 11/07/18  3:05 PM  Result Value Ref Range   FIO2 PENDING    pH, Ven 7.405 7.250 - 7.430   pCO2, Ven 36.7 (L) 44.0 - 60.0 mmHg   pO2, Ven  32.0 - 45.0 mmHg    CRITICAL RESULT CALLED TO, READ BACK BY AND VERIFIED WITH:    Comment: BELOW REPORTABLE RANGER DR BERO,MD BY LISA CRADDOCK,RRT,RCP ON 11/07/2018 AT 1515    Bicarbonate 22.5 20.0 - 28.0 mmol/L   Acid-base deficit 1.3 0.0 - 2.0 mmol/L  O2 Saturation 11.6 %   Patient temperature 98.6    Collection site VEIN    Drawn by DRAWN BY RN    Sample type VEIN     Comment: Performed at [place] [address], [place], [place] [address]  POC CBG, ED     Status: Abnormal   Collection Time: 11/07/18  3:07 PM   Result Value Ref Range   Glucose-Capillary 216 (H) 70 - 99 mg/dL   Ct Angio Chest Pe W And/or Wo Contrast  Result Date: 11/07/2018 CLINICAL DATA:  Sharp epigastric pain radiating laterally and right lower quadrant. EXAM: CT ANGIOGRAPHY CHEST CT ABDOMEN AND PELVIS WITH CONTRAST TECHNIQUE: Multidetector CT imaging of the chest was performed using the standard protocol during bolus administration of intravenous contrast. Multiplanar CT image reconstructions and MIPs were obtained to evaluate the vascular anatomy. Multidetector CT imaging of the abdomen and pelvis was performed using the standard protocol during bolus administration of intravenous contrast. CONTRAST:  <See Chart> OMNIPAQUE IOHEXOL 300 MG/ML SOLN, 134mL OMNIPAQUE IOHEXOL 350 MG/ML SOLN COMPARISON:  Chest radiograph of earlier today. Abdominopelvic CT of 08/15/2015. No prior chest CT. FINDINGS: CTA CHEST FINDINGS Cardiovascular: The quality of this exam for evaluation of pulmonary embolism is good. No evidence of pulmonary embolism. Aortic and branch vessel atherosclerosis. Tortuous thoracic aorta. Mild cardiomegaly, with no pericardial effusion. Multivessel coronary artery atherosclerosis. Mediastinum/Nodes: No mediastinal or hilar adenopathy. Dilated esophagus with fluid level within. Lungs/Pleura: Trace right pleural fluid. Mild motion degradation inferiorly. 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 Pleuroparenchymal scarring at the apices. A right apical irregular density measures 9 mm on 32/12 and is favored to be related to pleuroparenchymal scarring. Spiculated posterolateral right upper lobe density measures maximally 12 mm, including on image 57/12. Musculoskeletal: Left greater than right mild gynecomastia. Osteopenia. Distal left clavicular fracture is remote. Mild superior endplate compression deformities at multiple upper thoracic levels. Review of the MIP images confirms the above findings. CT ABDOMEN and PELVIS FINDINGS  Hepatobiliary: Subtle irregular hepatic capsule. No focal liver lesion. Normal gallbladder, without biliary ductal dilatation. Pancreas: Mild pancreatic atrophy, without duct dilatation or acute inflammation. Spleen: Normal in size, without focal abnormality. Adrenals/Urinary Tract: Normal adrenal glands. Normal kidneys, without hydronephrosis. Decompressed urinary bladder Stomach/Bowel: Gastric antral wall thickening, accentuated by underdistention. Example image 24/3. Possible gas within the posterior wall of the gastric antrum on 25/3. Mucosal hyperenhancement is suspected within the antral pyloric region including on 26/3. Normal colon and terminal ileum. Normal caliber of small bowel loops. Suggestion of mild mucosal hyperenhancement within mid ileal loops in the right hemipelvis on image 64/3. Vascular/Lymphatic: Advanced aortic and branch vessel atherosclerosis. Surgical changes within the left groin in the region of left femoral artery dilatation at 1.6 cm today versus 1.4 cm in 2017. No abdominopelvic adenopathy. Reproductive: Prostatectomy Other: Small volume abdominopelvic fluid. Extensive free intraperitoneal air, including within the upper abdomen on image 17/3, within the lesser sac on image 24/3. Musculoskeletal: Osteopenia. Moderate L5 compression deformity, new since 2017 Review of the MIP images confirms the above findings. IMPRESSION: 1. Extensive free intraperitoneal air and fluid throughout the abdomen and pelvis. Favored etiology is peptic ulcer disease, possibly in the gastric antrum or less likely pylorus. More mild mucosal hyperenhancement in the ileum may represent enteritis but is felt less likely to be the cause of the free intraperitoneal air. These results were called by telephone at the time of interpretation on 11/07/2018 at 1:59 pm to [facility] , who verbally acknowledged  these results. 2.  No evidence of pulmonary embolism. 3. Right upper lobe irregular opacity which could represent  scarring or a primary bronchogenic carcinoma. Consider outpatient PET. 4. Aortic atherosclerosis (ICD10-I70.0), coronary artery atherosclerosis and emphysema (ICD10-J43.9). 5. Osteopenia with thoracolumbar compression deformities. 6. Esophageal air fluid level suggests dysmotility or gastroesophageal reflux. 7. Possible mild cirrhosis.  Correlate with risk factors. Electronically Signed   By: Abigail Miyamoto M.D.   On: 11/07/2018 13:59   Ct Abdomen Pelvis W Contrast  Result Date: 11/07/2018 CLINICAL DATA:  Sharp epigastric pain radiating laterally and right lower quadrant. EXAM: CT ANGIOGRAPHY CHEST CT ABDOMEN AND PELVIS WITH CONTRAST TECHNIQUE: Multidetector CT imaging of the chest was performed using the standard protocol during bolus administration of intravenous contrast. Multiplanar CT image reconstructions and MIPs were obtained to evaluate the vascular anatomy. Multidetector CT imaging of the abdomen and pelvis was performed using the standard protocol during bolus administration of intravenous contrast. CONTRAST:  <See Chart> OMNIPAQUE IOHEXOL 300 MG/ML SOLN, 142mL OMNIPAQUE IOHEXOL 350 MG/ML SOLN COMPARISON:  Chest radiograph of earlier today. Abdominopelvic CT of 08/15/2015. No prior chest CT. FINDINGS: CTA CHEST FINDINGS Cardiovascular: The quality of this exam for evaluation of pulmonary embolism is good. No evidence of pulmonary embolism. Aortic and branch vessel atherosclerosis. Tortuous thoracic aorta. Mild cardiomegaly, with no pericardial effusion. Multivessel coronary artery atherosclerosis. Mediastinum/Nodes: No mediastinal or hilar adenopathy. Dilated esophagus with fluid level within. Lungs/Pleura: Trace right pleural fluid. Mild motion degradation inferiorly. 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 Pleuroparenchymal scarring at the apices. A right apical irregular density measures 9 mm on 32/12 and is favored to be related to pleuroparenchymal scarring. Spiculated posterolateral right upper  lobe density measures maximally 12 mm, including on image 57/12. Musculoskeletal: Left greater than right mild gynecomastia. Osteopenia. Distal left clavicular fracture is remote. Mild superior endplate compression deformities at multiple upper thoracic levels. Review of the MIP images confirms the above findings. CT ABDOMEN and PELVIS FINDINGS Hepatobiliary: Subtle irregular hepatic capsule. No focal liver lesion. Normal gallbladder, without biliary ductal dilatation. Pancreas: Mild pancreatic atrophy, without duct dilatation or acute inflammation. Spleen: Normal in size, without focal abnormality. Adrenals/Urinary Tract: Normal adrenal glands. Normal kidneys, without hydronephrosis. Decompressed urinary bladder Stomach/Bowel: Gastric antral wall thickening, accentuated by underdistention. Example image 24/3. Possible gas within the posterior wall of the gastric antrum on 25/3. Mucosal hyperenhancement is suspected within the antral pyloric region including on 26/3. Normal colon and terminal ileum. Normal caliber of small bowel loops. Suggestion of mild mucosal hyperenhancement within mid ileal loops in the right hemipelvis on image 64/3. Vascular/Lymphatic: Advanced aortic and branch vessel atherosclerosis. Surgical changes within the left groin in the region of left femoral artery dilatation at 1.6 cm today versus 1.4 cm in 2017. No abdominopelvic adenopathy. Reproductive: Prostatectomy Other: Small volume abdominopelvic fluid. Extensive free intraperitoneal air, including within the upper abdomen on image 17/3, within the lesser sac on image 24/3. Musculoskeletal: Osteopenia. Moderate L5 compression deformity, new since 2017 Review of the MIP images confirms the above findings. IMPRESSION: 1. Extensive free intraperitoneal air and fluid throughout the abdomen and pelvis. Favored etiology is peptic ulcer disease, possibly in the gastric antrum or less likely pylorus. More mild mucosal hyperenhancement in the  ileum may represent enteritis but is felt less likely to be the cause of the free intraperitoneal air. These results were called by telephone at the time of interpretation on 11/07/2018 at 1:59 pm to [facility] , who verbally acknowledged these results. 2.  No  evidence of pulmonary embolism. 3. Right upper lobe irregular opacity which could represent scarring or a primary bronchogenic carcinoma. Consider outpatient PET. 4. Aortic atherosclerosis (ICD10-I70.0), coronary artery atherosclerosis and emphysema (ICD10-J43.9). 5. Osteopenia with thoracolumbar compression deformities. 6. Esophageal air fluid level suggests dysmotility or gastroesophageal reflux. 7. Possible mild cirrhosis.  Correlate with risk factors. Electronically Signed   By: Abigail Miyamoto M.D.   On: 11/07/2018 13:59   Dg Chest Port 1 View  Result Date: 11/07/2018 CLINICAL DATA:  Pain and dehydration EXAM: PORTABLE CHEST 1 VIEW COMPARISON:  September 04, 2015 FINDINGS: There is no edema or consolidation. Heart size and pulmonary vascularity are normal. No adenopathy. There is evidence of a prior fracture of the lateral left clavicle. IMPRESSION: No edema or consolidation.  Stable cardiac silhouette. Electronically Signed   By: Lowella Grip III M.D.   On: 11/07/2018 12:40    Pending Labs Unresulted Labs (From admission, onward)    Start     Ordered   11/08/18 0500  Blood gas, arterial  Tomorrow morning,   R     11/07/18 1530   11/08/18 0500  Comprehensive metabolic panel  Tomorrow morning,   R     11/07/18 1530   11/08/18 0500  CBC  Tomorrow morning,   R     11/07/18 1530   11/08/18 0500  Magnesium  Tomorrow morning,   R     11/07/18 1530   11/08/18 0500  Phosphorus  Tomorrow morning,   R     11/07/18 1530   11/07/18 1119  Blood culture (routine x 2)  BLOOD CULTURE X 2,   STAT     11/07/18 1119   11/07/18 1049  Urinalysis, Routine w reflex microscopic  ONCE - STAT,   STAT     11/07/18 1048          Vitals/Pain Today's Vitals    11/07/18 1436 11/07/18 1454 11/07/18 1503 11/07/18 1530  BP: (!) 88/61 94/73 124/73 (!) 113/57  Pulse: (!) 56 (!) 54 61 61  Resp: 20 18 18  (!) 22  Temp:      TempSrc:      SpO2: 98% 99% 98% 99%  Weight:      Height:      PainSc:        Isolation Precautions Droplet and Contact precautions  Medications Medications  dextrose 5% in lactated ringers with KCl 20 mEq/L infusion ( Intravenous New Bag/Given 11/07/18 1328)  sodium chloride (PF) 0.9 % injection (has no administration in time range)  fentaNYL (SUBLIMAZE) injection 50 mcg (has no administration in time range)  latanoprost (XALATAN) 0.005 % ophthalmic solution 1 drop (has no administration in time range)  ipratropium-albuterol (DUONEB) 0.5-2.5 (3) MG/3ML nebulizer solution 3 mL (has no administration in time range)  insulin aspart (novoLOG) injection 2-6 Units (has no administration in time range)  piperacillin-tazobactam (ZOSYN) IVPB 3.375 g (has no administration in time range)  sodium chloride flush (NS) 0.9 % injection 3 mL (3 mLs Intravenous Given 11/07/18 1230)  sodium chloride 0.9 % bolus 1,000 mL (0 mLs Intravenous Stopped 11/07/18 1413)  magnesium sulfate IVPB 2 g 50 mL (0 g Intravenous Stopped 11/07/18 1426)  potassium chloride 10 mEq in 100 mL IVPB (0 mEq Intravenous Stopped 11/07/18 1426)  calcium gluconate 1 g/ 50 mL sodium chloride IVPB (0 g Intravenous Stopped 11/07/18 1426)  iohexol (OMNIPAQUE) 350 MG/ML injection 100 mL (100 mLs Intravenous Contrast Given 11/07/18 1248)  piperacillin-tazobactam (ZOSYN) IVPB 3.375 g (0 g  Intravenous Stopped 11/07/18 1517)  sodium chloride 0.9 % bolus 2,000 mL (2,000 mLs Intravenous New Bag/Given 11/07/18 1452)  sodium bicarbonate injection 50 mEq (50 mEq Intravenous Given 11/07/18 1456)    Mobility walks

## 2018-11-07 NOTE — ED Provider Notes (Signed)
[facility] DEPT Provider Note   CSN: [id number] Arrival date & time: 11/07/18  1018    History   Chief Complaint Chief Complaint  Patient presents with   Abdominal Pain    HPI Rodney [name] is a 68 y.o. male.     Rodney [name] is a 68 y.o. male with a history of CAD, hypertension, hyperlipidemia, heart murmur, prostate cancer, alcohol dependence, who presents to the emergency department via ambulance for evaluation of epigastric abdominal pain.  He reports pain has been present and worsening over the past week.  Pain has become extremely severe he reports associated nausea and poor appetite he has not been eating or drinking much over the past several days.  He has not had any diarrhea, melena or hematochezia.  He reports pain is worse when he lays flat or tries to get up and move around.  He also reports that he feels very dehydrated and feels lightheaded when he gets up to stand, EMS noted him to be orthostatic on scene when they arrived he was mottled with gray nailbeds, he was satting 88% on room air this improved to 99% on 4 L of oxygen.  He reports that yesterday and today he started feeling short of breath but he denies cough he has had some chills but took his temperature at home and did not have any fever.  He also reports that he has a wound on his right leg that he has been seeing outpatient wound care for he reports they placed him on an antibiotic, he thinks it is either Bactrim or doxycycline.  Patient does endorse daily alcohol use, last drink yesterday.     Past Medical History:  Diagnosis Date   AKI (acute kidney injury) ([place])    Alcohol dependence ([place])    Benign essential HTN 09/01/2015   Bullous pemphigoid    CAD (coronary artery disease)    Cataract    left eye surgery/    Cellulitis of scrotum    Heart murmur    history   Hyponatremia    Peripheral vascular disease (HCC)    Prostate cancer ([place]) 05/03/2009   Prostatectomy/Adenocrcinoma   Radiation 07/02/11-08/22/11   Prostate fossa 68.4 gray 38 fractions   Severe malnutrition (HCC)    Thrombocytopenia (HCC)     Patient Active Problem List   Diagnosis Date Noted   Cigarette smoker 09/08/2015   CAD in native artery with stent 09/08/2015   Bullous pemphigoid 09/08/2015   Fournier's gangrene of scrotum 09/01/2015   Benign essential HTN 09/01/2015   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/25/2015   Cellulitis, scrotum 08/16/2015   Scrotal swelling    Cellulitis of scrotum 08/15/2015   Hyponatremia 08/15/2015   AKI (acute kidney injury) ([place]) 08/15/2015   Protein calorie malnutrition ([place]) 08/15/2015   Thrombocytopenia ([place]) 08/15/2015   Anemia, normocytic normochromic 08/15/2015   Scrotal abscess 08/15/2015   Acute coronary syndrome ([place]) 08/03/2015   Atherosclerosis of native arteries of extremity with rest pain ([place]) 02/16/2015   PAD (peripheral artery disease) ([place]) 02/16/2015   Ischemic leg 02/09/2015   Dysuria 08/09/2011   Prostate cancer ([place]) 05/03/2009    Past Surgical History:  Procedure Laterality Date   APPLICATION OF A-CELL OF EXTREMITY N/A 09/25/2015   Procedure: A CELL ;  Surgeon: Loel Lofty Dillingham, DO;  Location: [facility];  Service: Plastics;  Laterality: N/A;   APPLICATION OF A-CELL OF EXTREMITY N/A 10/05/2015   Procedure: APPLICATION OF A-CELL TO  SCROTAL, ABDOMINAL  AND PERINEAL WOUND;  Surgeon: Loel Lofty Dillingham, DO;  Location: [facility];  Service: Plastics;  Laterality: N/A;   CARDIAC CATHETERIZATION N/A 08/04/2015   Procedure: Left Heart Cath and Coronary Angiography;  Surgeon: Charolette Forward, MD;  Location: [facility];  Service: Cardiovascular;  Laterality: N/A;   ENDARTERECTOMY FEMORAL Left 02/17/2015   Procedure: LEFT FEMORAl ENDARTERECTOMY ;  Surgeon: Angelia Mould, MD;  Location: [facility];  Service: Vascular;  Laterality: Left;   HEMORRHOID SURGERY     INCISION AND  DRAINAGE OF WOUND N/A 09/14/2015   Procedure: IRRIGATION AND DEBRIDEMENT SCROTAL WOUND WITH PLACEMENT OF ACELL;  Surgeon: Loel Lofty Dillingham, DO;  Location: [facility];  Service: Plastics;  Laterality: N/A;  Including Groin   INCISION AND DRAINAGE OF WOUND N/A 09/25/2015   Procedure: IRRIGATION AND DEBRIDEMENT OF SCROTUM  WOUND;  Surgeon: Loel Lofty Dillingham, DO;  Location: [facility];  Service: Plastics;  Laterality: N/A;   INCISION AND DRAINAGE OF WOUND N/A 10/05/2015   Procedure: IRRIGATION AND DEBRIDEMENT OF SCROTAL, ABDOMINAL  AND PERINEAL WOUND;  Surgeon: Loel Lofty Dillingham, DO;  Location: [facility];  Service: Plastics;  Laterality: N/A;   IRRIGATION AND DEBRIDEMENT ABSCESS N/A 08/31/2015   Procedure: IRRIGATION AND DEBRIDEMENT SCROTAL  ABSCESS;  Surgeon: Kathie Rhodes, MD;  [facility];  Service: Urology;  Laterality: N/A;   IRRIGATION AND DEBRIDEMENT ABSCESS N/A 08/31/2015   Procedure: IRRIGATION AND DEBRIDEMENT ABSCESS;  Surgeon: Rolm Bookbinder, MD;  Location[facility];  Service: General;  Laterality: N/A;   IRRIGATION AND DEBRIDEMENT ABSCESS N/A 09/02/2015   Procedure: IRRIGATION AND DEBRIDEMENT OF SCROTUM, ABDOMEN AND PERINEUM  WITH DRESSING CHANGE;  Surgeon: Nickie Retort, MD;  [facility];  Service: Urology;  Laterality: N/A;   IRRIGATION AND DEBRIDEMENT ABSCESS N/A 09/04/2015   Procedure: IRRIGATION AND DEBRIDEMENT and dressing change ofABSCESS;  Surgeon: Irine Seal, MD;  Location[facility];  Service: Urology;  Laterality: N/A;  to penis and scrotum   left eye surgery     LEFT HEART CATHETERIZATION WITH CORONARY ANGIOGRAM N/A 02/25/2012   Procedure: LEFT HEART CATHETERIZATION WITH CORONARY ANGIOGRAM;  Surgeon: Clent Demark, MD;  Location: [facility];  Service: Cardiovascular;  Laterality: N/A;   PATCH ANGIOPLASTY Left 02/17/2015   Procedure: VEIN PATCH ANGIOPLASTY;  Surgeon: Angelia Mould, MD;  Location: [facility];  Service: Vascular;  Laterality: Left;   PERIPHERAL VASCULAR  CATHETERIZATION N/A 02/10/2015   Procedure: Abdominal Aortogram;  Surgeon: Elam Dutch, MD;  Location: [facility];  Service: Cardiovascular;  Laterality: N/A;   PERIPHERAL VASCULAR CATHETERIZATION Bilateral 02/10/2015   Procedure: Lower Extremity Angiography;  Surgeon: Elam Dutch, MD;  Location: [facility];  Service: Cardiovascular;  Laterality: Bilateral;   ROBOT ASSISTED LAPAROSCOPIC RADICAL PROSTATECTOMY  05/03/2009   WRIST SURGERY     left        Home Medications    Prior to Admission medications   Medication Sig Start Date End Date Taking? Authorizing Provider  amoxicillin-clavulanate (AUGMENTIN) 875-125 MG tablet Take 1 tablet by mouth every 12 (twelve) hours. 09/08/15   Debbe Odea, MD  aspirin EC 81 MG EC tablet Take 1 tablet (81 mg total) by mouth daily. 02/27/12   Charolette Forward, MD  atorvastatin (LIPITOR) 80 MG tablet Take 1 tablet (80 mg total) by mouth daily at 6 PM. 02/27/12   Charolette Forward, MD  fluconazole (DIFLUCAN) 200 MG tablet Take 1 tablet (200 mg total) by  mouth daily. 09/08/15   Debbe Odea, MD  heparin 5000 UNIT/ML injection Inject 1 mL (5,000 Units total) into the skin every 8 (eight) hours. 09/08/15   Debbe Odea, MD  HYDROcodone-acetaminophen (NORCO/VICODIN) 5-325 MG tablet Take 1-2 tablets by mouth every 4 (four) hours as needed for moderate pain. Patient taking differently: Take 1-2 tablets by mouth every 4 (four) hours as needed for moderate pain or severe pain.  08/22/15   Orson Eva, MD  latanoprost (XALATAN) 0.005 % ophthalmic solution Place 1 drop into both eyes at bedtime.    [provider]  metoprolol succinate (TOPROL-XL) 25 MG 24 hr tablet Take 1 tablet (25 mg total) by mouth daily. 09/08/15   Debbe Odea, MD  nitroGLYCERIN (NITROSTAT) 0.4 MG SL tablet Place 1 tablet (0.4 mg total) under the tongue every 5 (five) minutes x 3 doses as needed for chest pain. 02/27/12   Charolette Forward, MD  polyvinyl alcohol (LIQUIFILM TEARS)  1.4 % ophthalmic solution Place 1 drop into the right eye as needed for dry eyes. 09/08/15   Debbe Odea, MD  prednisoLONE acetate (PRED FORTE) 1 % ophthalmic suspension Place 1 drop into the right eye 4 (four) times daily. 09/08/15   Debbe Odea, MD  predniSONE (DELTASONE) 10 MG tablet Take 1 tablet (10 mg total) by mouth daily with breakfast. 09/08/15   Debbe Odea, MD  Protein (PROCEL) POWD Take 2 scoop by mouth 2 (two) times daily.    [provider]  saccharomyces boulardii (FLORASTOR) 250 MG capsule Take 1 capsule (250 mg total) by mouth 2 (two) times daily. 09/08/15   Debbe Odea, MD  sulfamethoxazole-trimethoprim (BACTRIM DS,SEPTRA DS) 800-160 MG tablet Take 1 tablet by mouth every 12 (twelve) hours. 09/08/15   Debbe Odea, MD    Family History Family History  Problem Relation Age of Onset   Uterine cancer Mother 63       going to [employer]   Breast cancer Sister     Social History Social History   Tobacco Use   Smoking status: Former Smoker    Packs/day: 2.00    Years: 36.00    Pack years: 72.00    Types: Cigarettes    Last attempt to quit: 12/08/2014    Years since quitting: 3.9   Smokeless tobacco: Never Used  Substance Use Topics   Alcohol use: Yes    Alcohol/week: 6.0 standard drinks    Types: 6 Cans of beer per week    Comment: 6-7  cans drinks daily 12 oz   Drug use: No     Allergies   Other   Review of Systems Review of Systems  Constitutional: Negative for chills and fever.  HENT: Negative.   Eyes: Negative for visual disturbance.  Respiratory: Positive for shortness of breath. Negative for cough.   Cardiovascular: Negative for chest pain.  Gastrointestinal: Positive for abdominal pain and nausea. Negative for constipation, diarrhea and vomiting.  Genitourinary: Negative for dysuria and frequency.  Musculoskeletal: Negative for arthralgias and myalgias.  Skin: Negative for color change and rash.  Neurological: Positive for  light-headedness. Negative for dizziness and syncope.     Physical Exam Updated Vital Signs BP (!) 126/92 (BP Location: Left Arm)    Pulse (!) 49    Temp 97.6 F (36.4 C) (Oral)    Resp 17    Ht 5\' 8"  (1.727 m)    Wt 58.1 kg    SpO2 95%    BMI 19.46 kg/m   Physical Exam Vitals  signs and nursing note reviewed.  Constitutional:      General: He is not in acute distress.    Appearance: He is well-developed. He is ill-appearing. He is not diaphoretic.     Comments: Patient is ill-appearing but in no acute distress, alert and conversant  HENT:     Head: Normocephalic and atraumatic.     Mouth/Throat:     Comments: Mucous membranes dry Eyes:     General:        Right eye: No discharge.        Left eye: No discharge.     Pupils: Pupils are equal, round, and reactive to light.  Neck:     Musculoskeletal: Neck supple.  Cardiovascular:     Rate and Rhythm: Normal rate and regular rhythm.     Heart sounds: Normal heart sounds. No murmur. No friction rub. No gallop.   Pulmonary:     Effort: Pulmonary effort is normal. No respiratory distress.     Breath sounds: Normal breath sounds. No wheezing or rales.     Comments: Respirations equal and unlabored on 3L nasal canula, patient able to speak in full sentences, lungs clear to auscultation bilaterally  Abdominal:     General: Bowel sounds are normal. There is no distension.     Palpations: There is no mass.     Tenderness: There is generalized abdominal tenderness. There is guarding.     Comments: Abdomen with generalized tenderness, most noted in the epigastrium, there is guarding present, consistent with peritonitis.  Musculoskeletal:        General: No deformity.  Skin:    General: Skin is warm and dry.     Capillary Refill: Capillary refill takes less than 2 seconds.     Comments: Chronic appearing wound over the left thigh draining serosanguineous fluid, no purulence, no surrounding erythema.  Neurological:     Mental Status: He  is alert.     Coordination: Coordination normal.     Comments: Speech is clear, able to follow commands Moves extremities without ataxia, coordination intact   Psychiatric:        Mood and Affect: Mood normal.        Behavior: Behavior normal.        ED Treatments / Results  Labs (all labs ordered are listed, but only abnormal results are displayed) Labs Reviewed  COMPREHENSIVE METABOLIC PANEL - Abnormal; Notable for the following components:      Result Value   Sodium 131 (*)    Potassium 3.0 (*)    Chloride 115 (*)    CO2 11 (*)    Glucose, Bld 61 (*)    Calcium 4.2 (*)    Albumin 1.1 (*)    All other components within normal limits  CBC - Abnormal; Notable for the following components:   RBC 2.71 (*)    Hemoglobin 8.1 (*)    HCT 25.5 (*)    All other components within normal limits  SARS CORONAVIRUS 2 (HOSPITAL ORDER, [facility])  CULTURE, BLOOD (ROUTINE X 2)  CULTURE, BLOOD (ROUTINE X 2)  LIPASE, BLOOD  ETHANOL  LACTIC ACID, PLASMA  URINALYSIS, ROUTINE W REFLEX MICROSCOPIC  LACTIC ACID, PLASMA  TROPONIN I  MAGNESIUM  PROTIME-INR  TYPE AND SCREEN    EKG EKG Interpretation  Date/Time:  Saturday November 07 2018 10:41:00 EDT Ventricular Rate:  48 PR Interval:    QRS Duration: 108 QT Interval:  531 QTC Calculation: 480 R  Axis:   85 Text Interpretation:  Sinus bradycardia Borderline right axis deviation Low voltage, extremity leads Minimal ST depression, inferior leads Borderline prolonged QT interval Confirmed by Gerlene Fee [phone]) on 11/07/2018 11:32:31 AM   Radiology Ct Angio Chest Pe W And/or Wo Contrast  Result Date: 11/07/2018 CLINICAL DATA:  Sharp epigastric pain radiating laterally and right lower quadrant. EXAM: CT ANGIOGRAPHY CHEST CT ABDOMEN AND PELVIS WITH CONTRAST TECHNIQUE: Multidetector CT imaging of the chest was performed using the standard protocol during bolus administration of intravenous contrast. Multiplanar CT  image reconstructions and MIPs were obtained to evaluate the vascular anatomy. Multidetector CT imaging of the abdomen and pelvis was performed using the standard protocol during bolus administration of intravenous contrast. CONTRAST:  <See Chart> OMNIPAQUE IOHEXOL 300 MG/ML SOLN, 165mL OMNIPAQUE IOHEXOL 350 MG/ML SOLN COMPARISON:  Chest radiograph of earlier today. Abdominopelvic CT of 08/15/2015. No prior chest CT. FINDINGS: CTA CHEST FINDINGS Cardiovascular: The quality of this exam for evaluation of pulmonary embolism is good. No evidence of pulmonary embolism. Aortic and branch vessel atherosclerosis. Tortuous thoracic aorta. Mild cardiomegaly, with no pericardial effusion. Multivessel coronary artery atherosclerosis. Mediastinum/Nodes: No mediastinal or hilar adenopathy. Dilated esophagus with fluid level within. Lungs/Pleura: Trace right pleural fluid. Mild motion degradation inferiorly. 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 Pleuroparenchymal scarring at the apices. A right apical irregular density measures 9 mm on 32/12 and is favored to be related to pleuroparenchymal scarring. Spiculated posterolateral right upper lobe density measures maximally 12 mm, including on image 57/12. Musculoskeletal: Left greater than right mild gynecomastia. Osteopenia. Distal left clavicular fracture is remote. Mild superior endplate compression deformities at multiple upper thoracic levels. Review of the MIP images confirms the above findings. CT ABDOMEN and PELVIS FINDINGS Hepatobiliary: Subtle irregular hepatic capsule. No focal liver lesion. Normal gallbladder, without biliary ductal dilatation. Pancreas: Mild pancreatic atrophy, without duct dilatation or acute inflammation. Spleen: Normal in size, without focal abnormality. Adrenals/Urinary Tract: Normal adrenal glands. Normal kidneys, without hydronephrosis. Decompressed urinary bladder Stomach/Bowel: Gastric antral wall thickening, accentuated by underdistention.  Example image 24/3. Possible gas within the posterior wall of the gastric antrum on 25/3. Mucosal hyperenhancement is suspected within the antral pyloric region including on 26/3. Normal colon and terminal ileum. Normal caliber of small bowel loops. Suggestion of mild mucosal hyperenhancement within mid ileal loops in the right hemipelvis on image 64/3. Vascular/Lymphatic: Advanced aortic and branch vessel atherosclerosis. Surgical changes within the left groin in the region of left femoral artery dilatation at 1.6 cm today versus 1.4 cm in 2017. No abdominopelvic adenopathy. Reproductive: Prostatectomy Other: Small volume abdominopelvic fluid. Extensive free intraperitoneal air, including within the upper abdomen on image 17/3, within the lesser sac on image 24/3. Musculoskeletal: Osteopenia. Moderate L5 compression deformity, new since 2017 Review of the MIP images confirms the above findings. IMPRESSION: 1. Extensive free intraperitoneal air and fluid throughout the abdomen and pelvis. Favored etiology is peptic ulcer disease, possibly in the gastric antrum or less likely pylorus. More mild mucosal hyperenhancement in the ileum may represent enteritis but is felt less likely to be the cause of the free intraperitoneal air. These results were called by telephone at the time of interpretation on 11/07/2018 at 1:59 pm to [facility] , who verbally acknowledged these results. 2.  No evidence of pulmonary embolism. 3. Right upper lobe irregular opacity which could represent scarring or a primary bronchogenic carcinoma. Consider outpatient PET. 4. Aortic atherosclerosis (ICD10-I70.0), coronary artery atherosclerosis and emphysema (ICD10-J43.9). 5. Osteopenia with thoracolumbar compression deformities.  6. Esophageal air fluid level suggests dysmotility or gastroesophageal reflux. 7. Possible mild cirrhosis.  Correlate with risk factors. Electronically Signed   By: Abigail Miyamoto M.D.   On: 11/07/2018 13:59   Ct Abdomen  Pelvis W Contrast  Result Date: 11/07/2018 CLINICAL DATA:  Sharp epigastric pain radiating laterally and right lower quadrant. EXAM: CT ANGIOGRAPHY CHEST CT ABDOMEN AND PELVIS WITH CONTRAST TECHNIQUE: Multidetector CT imaging of the chest was performed using the standard protocol during bolus administration of intravenous contrast. Multiplanar CT image reconstructions and MIPs were obtained to evaluate the vascular anatomy. Multidetector CT imaging of the abdomen and pelvis was performed using the standard protocol during bolus administration of intravenous contrast. CONTRAST:  <See Chart> OMNIPAQUE IOHEXOL 300 MG/ML SOLN, 15mL OMNIPAQUE IOHEXOL 350 MG/ML SOLN COMPARISON:  Chest radiograph of earlier today. Abdominopelvic CT of 08/15/2015. No prior chest CT. FINDINGS: CTA CHEST FINDINGS Cardiovascular: The quality of this exam for evaluation of pulmonary embolism is good. No evidence of pulmonary embolism. Aortic and branch vessel atherosclerosis. Tortuous thoracic aorta. Mild cardiomegaly, with no pericardial effusion. Multivessel coronary artery atherosclerosis. Mediastinum/Nodes: No mediastinal or hilar adenopathy. Dilated esophagus with fluid level within. Lungs/Pleura: Trace right pleural fluid. Mild motion degradation inferiorly. 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 Pleuroparenchymal scarring at the apices. A right apical irregular density measures 9 mm on 32/12 and is favored to be related to pleuroparenchymal scarring. Spiculated posterolateral right upper lobe density measures maximally 12 mm, including on image 57/12. Musculoskeletal: Left greater than right mild gynecomastia. Osteopenia. Distal left clavicular fracture is remote. Mild superior endplate compression deformities at multiple upper thoracic levels. Review of the MIP images confirms the above findings. CT ABDOMEN and PELVIS FINDINGS Hepatobiliary: Subtle irregular hepatic capsule. No focal liver lesion. Normal gallbladder, without  biliary ductal dilatation. Pancreas: Mild pancreatic atrophy, without duct dilatation or acute inflammation. Spleen: Normal in size, without focal abnormality. Adrenals/Urinary Tract: Normal adrenal glands. Normal kidneys, without hydronephrosis. Decompressed urinary bladder Stomach/Bowel: Gastric antral wall thickening, accentuated by underdistention. Example image 24/3. Possible gas within the posterior wall of the gastric antrum on 25/3. Mucosal hyperenhancement is suspected within the antral pyloric region including on 26/3. Normal colon and terminal ileum. Normal caliber of small bowel loops. Suggestion of mild mucosal hyperenhancement within mid ileal loops in the right hemipelvis on image 64/3. Vascular/Lymphatic: Advanced aortic and branch vessel atherosclerosis. Surgical changes within the left groin in the region of left femoral artery dilatation at 1.6 cm today versus 1.4 cm in 2017. No abdominopelvic adenopathy. Reproductive: Prostatectomy Other: Small volume abdominopelvic fluid. Extensive free intraperitoneal air, including within the upper abdomen on image 17/3, within the lesser sac on image 24/3. Musculoskeletal: Osteopenia. Moderate L5 compression deformity, new since 2017 Review of the MIP images confirms the above findings. IMPRESSION: 1. Extensive free intraperitoneal air and fluid throughout the abdomen and pelvis. Favored etiology is peptic ulcer disease, possibly in the gastric antrum or less likely pylorus. More mild mucosal hyperenhancement in the ileum may represent enteritis but is felt less likely to be the cause of the free intraperitoneal air. These results were called by telephone at the time of interpretation on 11/07/2018 at 1:59 pm to [facility] , who verbally acknowledged these results. 2.  No evidence of pulmonary embolism. 3. Right upper lobe irregular opacity which could represent scarring or a primary bronchogenic carcinoma. Consider outpatient PET. 4. Aortic atherosclerosis  (ICD10-I70.0), coronary artery atherosclerosis and emphysema (ICD10-J43.9). 5. Osteopenia with thoracolumbar compression deformities. 6. Esophageal air fluid level  suggests dysmotility or gastroesophageal reflux. 7. Possible mild cirrhosis.  Correlate with risk factors. Electronically Signed   By: Abigail Miyamoto M.D.   On: 11/07/2018 13:59   Dg Chest Port 1 View  Result Date: 11/07/2018 CLINICAL DATA:  Pain and dehydration EXAM: PORTABLE CHEST 1 VIEW COMPARISON:  September 04, 2015 FINDINGS: There is no edema or consolidation. Heart size and pulmonary vascularity are normal. No adenopathy. There is evidence of a prior fracture of the lateral left clavicle. IMPRESSION: No edema or consolidation.  Stable cardiac silhouette. Electronically Signed   By: Lowella Grip III M.D.   On: 11/07/2018 12:40    Procedures .Critical Care Performed by: Jacqlyn Larsen, PA-C Authorized by: Jacqlyn Larsen, PA-C   Critical care provider statement:    Critical care time (minutes):  45   Critical care was necessary to treat or prevent imminent or life-threatening deterioration of the following conditions:  Metabolic crisis and dehydration (Bowel perforation, dehydration, multiple significant electrolyte derangements)   Critical care was time spent personally by me on the following activities:  Discussions with consultants, evaluation of patient's response to treatment, examination of patient, ordering and performing treatments and interventions, ordering and review of laboratory studies, ordering and review of radiographic studies, pulse oximetry, re-evaluation of patient's condition, obtaining history from patient or surrogate and review of old charts   (including critical care time)  Medications Ordered in ED Medications  dextrose 5% in lactated ringers with KCl 20 mEq/L infusion ( Intravenous New Bag/Given 11/07/18 1328)  sodium chloride (PF) 0.9 % injection (has no administration in time range)  fentaNYL (SUBLIMAZE)  injection 50 mcg (has no administration in time range)  latanoprost (XALATAN) 0.005 % ophthalmic solution 1 drop (has no administration in time range)  sodium chloride flush (NS) 0.9 % injection 3 mL (3 mLs Intravenous Given 11/07/18 1230)  sodium chloride 0.9 % bolus 1,000 mL (0 mLs Intravenous Stopped 11/07/18 1413)  magnesium sulfate IVPB 2 g 50 mL (0 g Intravenous Stopped 11/07/18 1426)  potassium chloride 10 mEq in 100 mL IVPB (0 mEq Intravenous Stopped 11/07/18 1426)  calcium gluconate 1 g/ 50 mL sodium chloride IVPB (0 g Intravenous Stopped 11/07/18 1426)  iohexol (OMNIPAQUE) 350 MG/ML injection 100 mL (100 mLs Intravenous Contrast Given 11/07/18 1248)  piperacillin-tazobactam (ZOSYN) IVPB 3.375 g (0 g Intravenous Stopped 11/07/18 1517)  sodium chloride 0.9 % bolus 2,000 mL (2,000 mLs Intravenous New Bag/Given 11/07/18 1452)  sodium bicarbonate injection 50 mEq (50 mEq Intravenous Given 11/07/18 1456)     Initial Impression / Assessment and Plan / ED Course  I have reviewed the triage vital signs and the nursing notes.  Pertinent labs & imaging results that were available during my care of the patient were reviewed by me and considered in my medical decision making (see chart for details).  Patient with a week of worsening abdominal pain as well as shortness of breath.  He arrives requiring 3 L nasal cannula, no prior oxygen requirement, he is bradycardic at 49, blood pressure initially soft with EMS but better here.  Patient is ill-appearing.  He has generalized abdominal tenderness with guarding and peritonitis.  Despite oxygen requirement lungs are clear and patient is breathing comfortably able to speak in full sentences.  Unclear etiology for patient's symptoms he has multiple comorbidities and history of alcohol dependence.  Will check basic labs, lactic acid, blood cultures, troponin, EKG, chest x-ray, coronavirus test, CTA of the chest and CT abdomen pelvis with contrast.  Will give IV fluids as  patient appears dry on exam.  Called with critical values from lab, hypocalcemia at 4.2, potassium of 3.0 and CO2 of 11.  Glucose is also low at 61.  Normal LFTs and renal function.  Patient will be given calcium gluconate, IV potassium and magnesium, mag level will be checked as well.  Lactic is not elevated.  EKG shows sinus bradycardia.  Troponin is negative.  Chest x-ray without acute abnormality.  Called by radiology regarding critical findings on CT, perforated viscus likely the pylorus of the stomach or duodenal ulcer with extensive free air, no evidence of PE on CT angio, but there is a mass within the right upper lung suggestive of primary bronchogenic carcinoma, no evidence of pneumonia or other infiltrate to account for patient's oxygen requirement I feel this is likely in the setting of critical illness with multiple metabolic abnormalities and perforated viscus.  Patient is critically ill with a bowel perforation, multiple significant electrolyte derangements, when I went in to reassess the patient he had blood pressures in the 80s/60s, 2 L fluid bolus ordered with immediate improvement in blood pressure.  Given initial CO2 of 11 we will also give amp of bicarb to potentially improve blood pressure.  General surgery has been consulted and Dr. Rosendo Gros is at bedside who plans to take patient to the OR, but requests critical care admission as patient has high threshold to decompensate especially after surgery.  Had initially discussed case with hospitalist for medicine admission but I have notified them that patient will be cared for by critical care postoperatively.  Case was discussed with Dr. Corinna Lines with critical care, CCM team will plan to admit patient.  Patient discussed with Dr. Sedonia Small, who saw patient as well and agrees with plan.   Final Clinical Impressions(s) / ED Diagnoses   Final diagnoses:  Bowel perforation (HCC)  Hypokalemia  Hypocalcemia  Hypomagnesemia  Bradycardia    Acute respiratory failure with hypoxia [facility])  Mass of right lung    ED Discharge Orders    None       Jacqlyn Larsen, [place] 11/07/18 1603    Maudie Flakes, MD 11/11/18 [id number]

## 2018-11-07 NOTE — Transfer of Care (Signed)
Immediate Anesthesia Transfer of Care Note  Patient: Rodney [name]  Procedure(s) Performed: LAPAROSCOPY DIAGNOSTIC (N/A ) EXPLORATORY LAPAROTOMY gram patch gastric ulcer (N/A Abdomen)  Patient Location: PACU  Anesthesia Type:General  Level of Consciousness: drowsy and patient cooperative  Airway & Oxygen Therapy: Patient Spontanous Breathing and Patient connected to face mask oxygen  Post-op Assessment: Report given to RN and Post -op Vital signs reviewed and stable  Post vital signs: Reviewed and stable  Last Vitals:  Vitals Value Taken Time  BP 111/63 11/07/2018  6:30 PM  Temp    Pulse    Resp 12 11/07/2018  6:31 PM  SpO2    Vitals shown include unvalidated device data.  Last Pain:  Vitals:   11/07/18 1047  TempSrc:   PainSc: 7          Complications: No apparent anesthesia complications

## 2018-11-07 NOTE — Consult Note (Signed)
Reason for Consult: Abdominal pain Referring Physician: [name] is an 68 y.o. male.  HPI: Patient is a 68 year old male comes in with a history significant for AKI, alcohol dependence, hypertension, CAD, peripheral vascular disease, left thigh wound.  Patient states he had pain started this a.m. at 930.  He states that the pain was in his epigastric region.  He states that the pain is fairly diffuse currently.  Patient had some nausea at that time.  Patient denies any fever or chills.  He states that the continued abdominal pain he brought him to the ER.  Upon evaluation the ER patient underwent CT scan.  Patient CT scan revealed large amount of free air, likely due to gastric/duodenal perforation.  I did review the CT scan personally.  Patient did not have leukocytosis.    Patient states he drinks 3-6 beers on a daily basis.  Patient denies any excess BC powder, aspirin, ibuprofen use.  Past Medical History:  Diagnosis Date  . AKI (acute kidney injury) ([place])   . Alcohol dependence ([place])   . Benign essential HTN 09/01/2015  . Bullous pemphigoid   . CAD (coronary artery disease)   . Cataract    left eye surgery/   . Cellulitis of scrotum   . Heart murmur    history  . Hyponatremia   . Peripheral vascular disease ([place])   . Prostate cancer ([place]) 05/03/2009   Prostatectomy/Adenocrcinoma  . Radiation 07/02/11-08/22/11   Prostate fossa 68.4 gray 38 fractions  . Severe malnutrition ([place])   . Thrombocytopenia ([place])     Past Surgical History:  Procedure Laterality Date  . APPLICATION OF A-CELL OF EXTREMITY N/A 09/25/2015   Procedure: A CELL ;  Surgeon: Loel Lofty Dillingham, DO;  Location: [place];  Service: Plastics;  Laterality: N/A;  . APPLICATION OF A-CELL OF EXTREMITY N/A 10/05/2015   Procedure: APPLICATION OF A-CELL TO SCROTAL, ABDOMINAL  AND PERINEAL WOUND;  Surgeon: Loel Lofty Dillingham, DO;  Location: [place];  Service: Plastics;  Laterality: N/A;  . CARDIAC CATHETERIZATION  N/A 08/04/2015   Procedure: Left Heart Cath and Coronary Angiography;  Surgeon: Charolette Forward, MD;  Location: [facility];  Service: Cardiovascular;  Laterality: N/A;  . ENDARTERECTOMY FEMORAL Left 02/17/2015   Procedure: LEFT FEMORAl ENDARTERECTOMY ;  Surgeon: Angelia Mould, MD;  Location: [place];  Service: Vascular;  Laterality: Left;  . HEMORRHOID SURGERY    . INCISION AND DRAINAGE OF WOUND N/A 09/14/2015   Procedure: IRRIGATION AND DEBRIDEMENT SCROTAL WOUND WITH PLACEMENT OF ACELL;  Surgeon: Loel Lofty Dillingham, DO;  Location: [place];  Service: Plastics;  Laterality: N/A;  Including Groin  . INCISION AND DRAINAGE OF WOUND N/A 09/25/2015   Procedure: IRRIGATION AND DEBRIDEMENT OF SCROTUM  WOUND;  Surgeon: Loel Lofty Dillingham, DO;  Location: [place];  Service: Plastics;  Laterality: N/A;  . INCISION AND DRAINAGE OF WOUND N/A 10/05/2015   Procedure: IRRIGATION AND DEBRIDEMENT OF SCROTAL, ABDOMINAL  AND PERINEAL WOUND;  Surgeon: Loel Lofty Dillingham, DO;  Location: [facility];  Service: Plastics;  Laterality: N/A;  . IRRIGATION AND DEBRIDEMENT ABSCESS N/A 08/31/2015   Procedure: IRRIGATION AND DEBRIDEMENT SCROTAL  ABSCESS;  Surgeon: Kathie Rhodes, MD;  [facility];  Service: Urology;  Laterality: N/A;  . IRRIGATION AND DEBRIDEMENT ABSCESS N/A 08/31/2015   Procedure: IRRIGATION AND DEBRIDEMENT ABSCESS;  Surgeon: Rolm Bookbinder, MD;  [facility];  Service: General;  Laterality: N/A;  . IRRIGATION AND DEBRIDEMENT ABSCESS N/A  09/02/2015   Procedure: IRRIGATION AND DEBRIDEMENT OF SCROTUM, ABDOMEN AND PERINEUM  WITH DRESSING CHANGE;  Surgeon: Nickie Retort, MD;  [facility];  Service: Urology;  Laterality: N/A;  . IRRIGATION AND DEBRIDEMENT ABSCESS N/A 09/04/2015   Procedure: IRRIGATION AND DEBRIDEMENT and dressing change ofABSCESS;  Surgeon: Irine Seal, MD;  [facility];  Service: Urology;  Laterality: N/A;  to penis and scrotum  . left eye surgery    . LEFT HEART CATHETERIZATION  WITH CORONARY ANGIOGRAM N/A 02/25/2012   Procedure: LEFT HEART CATHETERIZATION WITH CORONARY ANGIOGRAM;  Surgeon: Clent Demark, MD;  Location: [facility];  Service: Cardiovascular;  Laterality: N/A;  . PATCH ANGIOPLASTY Left 02/17/2015   Procedure: VEIN PATCH ANGIOPLASTY;  Surgeon: Angelia Mould, MD;  Location: [facility];  Service: Vascular;  Laterality: Left;  . PERIPHERAL VASCULAR CATHETERIZATION N/A 02/10/2015   Procedure: Abdominal Aortogram;  Surgeon: Elam Dutch, MD;  Location: [facility];  Service: Cardiovascular;  Laterality: N/A;  . PERIPHERAL VASCULAR CATHETERIZATION Bilateral 02/10/2015   Procedure: Lower Extremity Angiography;  Surgeon: Elam Dutch, MD;  Location: [facility] [facility];  Service: Cardiovascular;  Laterality: Bilateral;  . ROBOT ASSISTED LAPAROSCOPIC RADICAL PROSTATECTOMY  05/03/2009  . WRIST SURGERY     left    Family History  Problem Relation Age of Onset  . Uterine cancer Mother 105       [facility] [employer]  . Breast cancer Sister     Social History:  reports that he quit smoking about 3 years ago. His smoking use included cigarettes. He has a 72.00 pack-year smoking history. He has never used smokeless tobacco. He reports current alcohol use of about 6.0 standard drinks of alcohol per week. He reports that he does not use drugs.  Allergies:  Allergies  Allergen Reactions  . Other Rash    polyester and metals except titanium.    Medications: I have reviewed the patient's current medications.  Results for orders placed or performed during the hospital encounter of 11/07/18 (from the past 48 hour(s))  Lipase, blood     Status: None   Collection Time: 11/07/18 11:31 AM  Result Value Ref Range   Lipase 17 11 - 51 U/L    Comment: Performed at [facility], [place] [address], [place], [place] [address]  Comprehensive metabolic panel     Status: Abnormal   Collection Time: 11/07/18 11:31 AM  Result Value Ref Range   Sodium  131 (L) 135 - 145 mmol/L   Potassium 3.0 (L) 3.5 - 5.1 mmol/L   Chloride 115 (H) 98 - 111 mmol/L   CO2 11 (L) 22 - 32 mmol/L   Glucose, Bld 61 (L) 70 - 99 mg/dL   BUN 17 8 - 23 mg/dL   Creatinine, Ser 1.03 0.61 - 1.24 mg/dL   Calcium 4.2 (LL) 8.9 - 10.3 mg/dL    Comment: CRITICAL RESULT CALLED TO, READ BACK BY AND VERIFIED WITH: Rosine Abe [id number] @1225  BY V.WILKINS    Total Protein <3.0 (L) 6.5 - 8.1 g/dL   Albumin 1.1 (L) 3.5 - 5.0 g/dL   AST 18 15 - 41 U/L   ALT 10 0 - 44 U/L   Alkaline Phosphatase 56 38 - 126 U/L   Total Bilirubin 0.3 0.3 - 1.2 mg/dL   GFR calc non Af Amer >60 >60 mL/min   GFR calc Af Amer >60 >60 mL/min   Anion gap 5 5 - 15  Comment: Performed at [facility], [place] [address] [place], [place] [address]  CBC     Status: Abnormal   Collection Time: 11/07/18 11:31 AM  Result Value Ref Range   WBC 7.6 4.0 - 10.5 K/uL   RBC 2.71 (L) 4.22 - 5.81 MIL/uL   Hemoglobin 8.1 (L) 13.0 - 17.0 g/dL   HCT 25.5 (L) 39.0 - 52.0 %   MCV 94.1 80.0 - 100.0 fL   MCH 29.9 26.0 - 34.0 pg   MCHC 31.8 30.0 - 36.0 g/dL   RDW 13.2 11.5 - 15.5 %   Platelets 245 150 - 400 K/uL   nRBC 0.0 0.0 - 0.2 %    Comment: Performed at [facility], [place] [address] [place], [place] [address]  Ethanol     Status: None   Collection Time: 11/07/18 11:31 AM  Result Value Ref Range   Alcohol, Ethyl (B) <10 <10 mg/dL    Comment: (NOTE) Lowest detectable limit for serum alcohol is 10 mg/dL. For medical purposes only. Performed at [facility], [place] [address], [place], [place] [address]   Lactic acid, plasma     Status: None   Collection Time: 11/07/18 11:31 AM  Result Value Ref Range   Lactic Acid, Venous 1.5 0.5 - 1.9 mmol/L    Comment: Performed at [facility], [place] [address], [place], [place] [address]  Troponin I - ONCE - STAT     Status: None   Collection Time: 11/07/18 11:35 AM  Result Value Ref Range    Troponin I <0.03 <0.03 ng/mL    Comment: Performed at [facility], [place] [address] [place], [place] [address]  Type and screen     Status: None   Collection Time: 11/07/18 12:04 PM  Result Value Ref Range   ABO/RH(D) A POS    Antibody Screen NEG    Sample Expiration      11/10/2018,2359 Performed at [facility], [place] [address] [place],  [address]   Magnesium     Status: Abnormal   Collection Time: 11/07/18 12:31 PM  Result Value Ref Range   Magnesium 1.0 (L) 1.7 - 2.4 mg/dL    Comment: Performed at [facility], [place] [address] [place], [place] [address]    Ct Angio Chest Pe W And/or Wo Contrast  Result Date: 11/07/2018 CLINICAL DATA:  Sharp epigastric pain radiating laterally and right lower quadrant. EXAM: CT ANGIOGRAPHY CHEST CT ABDOMEN AND PELVIS WITH CONTRAST TECHNIQUE: Multidetector CT imaging of the chest was performed using the standard protocol during bolus administration of intravenous contrast. Multiplanar CT image reconstructions and MIPs were obtained to evaluate the vascular anatomy. Multidetector CT imaging of the abdomen and pelvis was performed using the standard protocol during bolus administration of intravenous contrast. CONTRAST:  <See Chart> OMNIPAQUE IOHEXOL 300 MG/ML SOLN, 136mL OMNIPAQUE IOHEXOL 350 MG/ML SOLN COMPARISON:  Chest radiograph of earlier today. Abdominopelvic CT of 08/15/2015. No prior chest CT. FINDINGS: CTA CHEST FINDINGS Cardiovascular: The quality of this exam for evaluation of pulmonary embolism is good. No evidence of pulmonary embolism. Aortic and branch vessel atherosclerosis. Tortuous thoracic aorta. Mild cardiomegaly, with no pericardial effusion. Multivessel coronary artery atherosclerosis. Mediastinum/Nodes: No mediastinal or hilar adenopathy. Dilated esophagus with fluid level within. Lungs/Pleura: Trace right pleural fluid. Mild motion degradation inferiorly. 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 Pleuroparenchymal scarring at the apices. A right apical irregular density measures 9 mm on 32/12 and  is favored to be related to pleuroparenchymal scarring. Spiculated posterolateral right upper lobe density measures maximally 12 mm, including on image 57/12. Musculoskeletal: Left greater than right mild gynecomastia. Osteopenia. Distal left clavicular fracture is remote. Mild superior endplate compression deformities at multiple upper thoracic levels. Review of the MIP images confirms the above findings. CT ABDOMEN and PELVIS FINDINGS Hepatobiliary: Subtle irregular hepatic capsule. No focal liver lesion. Normal gallbladder, without biliary ductal dilatation. Pancreas: Mild pancreatic atrophy, without duct dilatation or acute inflammation. Spleen: Normal in size, without focal abnormality. Adrenals/Urinary Tract: Normal adrenal glands. Normal kidneys, without hydronephrosis. Decompressed urinary bladder Stomach/Bowel: Gastric antral wall thickening, accentuated by underdistention. Example image 24/3. Possible gas within the posterior wall of the gastric antrum on 25/3. Mucosal hyperenhancement is suspected within the antral pyloric region including on 26/3. Normal colon and terminal ileum. Normal caliber of small bowel loops. Suggestion of mild mucosal hyperenhancement within mid ileal loops in the right hemipelvis on image 64/3. Vascular/Lymphatic: Advanced aortic and branch vessel atherosclerosis. Surgical changes within the left groin in the region of left femoral artery dilatation at 1.6 cm today versus 1.4 cm in 2017. No abdominopelvic adenopathy. Reproductive: Prostatectomy Other: Small volume abdominopelvic fluid. Extensive free intraperitoneal air, including within the upper abdomen on image 17/3, within the lesser sac on image 24/3. Musculoskeletal: Osteopenia. Moderate L5 compression deformity, new since 2017 Review of the MIP images confirms the above findings. IMPRESSION: 1.  Extensive free intraperitoneal air and fluid throughout the abdomen and pelvis. Favored etiology is peptic ulcer disease, possibly in the gastric antrum or less likely pylorus. More mild mucosal hyperenhancement in the ileum may represent enteritis but is felt less likely to be the cause of the free intraperitoneal air. These results were called by telephone at the time of interpretation on 11/07/2018 at 1:59 pm to [facility] , who verbally acknowledged these results. 2.  No evidence of pulmonary embolism. 3. Right upper lobe irregular opacity which could represent scarring or a primary bronchogenic carcinoma. Consider outpatient PET. 4. Aortic atherosclerosis (ICD10-I70.0), coronary artery atherosclerosis and emphysema (ICD10-J43.9). 5. Osteopenia with thoracolumbar compression deformities. 6. Esophageal air fluid level suggests dysmotility or gastroesophageal reflux. 7. Possible mild cirrhosis.  Correlate with risk factors. Electronically Signed   By: Rodney [name] M.D.   On: 11/07/2018 13:59   Ct Abdomen Pelvis W Contrast  Result Date: 11/07/2018 CLINICAL DATA:  Sharp epigastric pain radiating laterally and right lower quadrant. EXAM: CT ANGIOGRAPHY CHEST CT ABDOMEN AND PELVIS WITH CONTRAST TECHNIQUE: Multidetector CT imaging of the chest was performed using the standard protocol during bolus administration of intravenous contrast. Multiplanar CT image reconstructions and MIPs were obtained to evaluate the vascular anatomy. Multidetector CT imaging of the abdomen and pelvis was performed using the standard protocol during bolus administration of intravenous contrast. CONTRAST:  <See Chart> OMNIPAQUE IOHEXOL 300 MG/ML SOLN, 122mL OMNIPAQUE IOHEXOL 350 MG/ML SOLN COMPARISON:  Chest radiograph of earlier today. Abdominopelvic CT of 08/15/2015. No prior chest CT. FINDINGS: CTA CHEST FINDINGS Cardiovascular: The quality of this exam for evaluation of pulmonary embolism is good. No evidence of pulmonary embolism.  Aortic and branch vessel atherosclerosis. Tortuous thoracic aorta. Mild cardiomegaly, with no pericardial effusion. Multivessel coronary artery atherosclerosis. Mediastinum/Nodes: No mediastinal or hilar adenopathy. Dilated esophagus with fluid level within. Lungs/Pleura: Trace right pleural fluid. Mild motion degradation inferiorly. 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 Pleuroparenchymal scarring at the apices. A right apical irregular density measures 9 mm on 32/12 and is favored to be related to  pleuroparenchymal scarring. Spiculated posterolateral right upper lobe density measures maximally 12 mm, including on image 57/12. Musculoskeletal: Left greater than right mild gynecomastia. Osteopenia. Distal left clavicular fracture is remote. Mild superior endplate compression deformities at multiple upper thoracic levels. Review of the MIP images confirms the above findings. CT ABDOMEN and PELVIS FINDINGS Hepatobiliary: Subtle irregular hepatic capsule. No focal liver lesion. Normal gallbladder, without biliary ductal dilatation. Pancreas: Mild pancreatic atrophy, without duct dilatation or acute inflammation. Spleen: Normal in size, without focal abnormality. Adrenals/Urinary Tract: Normal adrenal glands. Normal kidneys, without hydronephrosis. Decompressed urinary bladder Stomach/Bowel: Gastric antral wall thickening, accentuated by underdistention. Example image 24/3. Possible gas within the posterior wall of the gastric antrum on 25/3. Mucosal hyperenhancement is suspected within the antral pyloric region including on 26/3. Normal colon and terminal ileum. Normal caliber of small bowel loops. Suggestion of mild mucosal hyperenhancement within mid ileal loops in the right hemipelvis on image 64/3. Vascular/Lymphatic: Advanced aortic and branch vessel atherosclerosis. Surgical changes within the left groin in the region of left femoral artery dilatation at 1.6 cm today versus 1.4 cm in 2017. No abdominopelvic  adenopathy. Reproductive: Prostatectomy Other: Small volume abdominopelvic fluid. Extensive free intraperitoneal air, including within the upper abdomen on image 17/3, within the lesser sac on image 24/3. Musculoskeletal: Osteopenia. Moderate L5 compression deformity, new since 2017 Review of the MIP images confirms the above findings. IMPRESSION: 1. Extensive free intraperitoneal air and fluid throughout the abdomen and pelvis. Favored etiology is peptic ulcer disease, possibly in the gastric antrum or less likely pylorus. More mild mucosal hyperenhancement in the ileum may represent enteritis but is felt less likely to be the cause of the free intraperitoneal air. These results were called by telephone at the time of interpretation on 11/07/2018 at 1:59 pm to [facility] , who verbally acknowledged these results. 2.  No evidence of pulmonary embolism. 3. Right upper lobe irregular opacity which could represent scarring or a primary bronchogenic carcinoma. Consider outpatient PET. 4. Aortic atherosclerosis (ICD10-I70.0), coronary artery atherosclerosis and emphysema (ICD10-J43.9). 5. Osteopenia with thoracolumbar compression deformities. 6. Esophageal air fluid level suggests dysmotility or gastroesophageal reflux. 7. Possible mild cirrhosis.  Correlate with risk factors. Electronically Signed   By: Rodney [name] M.D.   On: 11/07/2018 13:59   Dg Chest Port 1 View  Result Date: 11/07/2018 CLINICAL DATA:  Pain and dehydration EXAM: PORTABLE CHEST 1 VIEW COMPARISON:  September 04, 2015 FINDINGS: There is no edema or consolidation. Heart size and pulmonary vascularity are normal. No adenopathy. There is evidence of a prior fracture of the lateral left clavicle. IMPRESSION: No edema or consolidation.  Stable cardiac silhouette. Electronically Signed   By: Lowella Grip III M.D.   On: 11/07/2018 12:40    Review of Systems  Constitutional: Negative for chills, fever and malaise/fatigue.  HENT: Negative for ear  discharge, hearing loss and sore throat.   Eyes: Negative for blurred vision and discharge.  Respiratory: Negative for cough and shortness of breath.   Cardiovascular: Negative for chest pain, orthopnea and leg swelling.  Gastrointestinal: Positive for abdominal pain and nausea. Negative for constipation, diarrhea, heartburn and vomiting.  Musculoskeletal: Negative for myalgias and neck pain.  Skin: Negative for itching and rash.  Neurological: Negative for dizziness, focal weakness, seizures and loss of consciousness.  Endo/Heme/Allergies: Negative for environmental allergies. Does not bruise/bleed easily.  Psychiatric/Behavioral: Negative for depression and suicidal ideas.  All other systems reviewed and are negative.  Blood pressure 94/73, pulse (!) 54, temperature 97.6  F (36.4 C), temperature source Oral, resp. rate 18, height 5\' 8"  (1.727 m), weight 58.1 kg, SpO2 99 %. Physical Exam  Constitutional: He is oriented to person, place, and time. Vital signs are normal. He appears well-developed and well-nourished.  Conversant No acute distress  Eyes: Lids are normal. No scleral icterus.  No lid lag Moist conjunctiva  Neck: No tracheal tenderness present. No thyromegaly present.  No cervical lymphadenopathy  Cardiovascular: Normal rate, regular rhythm and intact distal pulses.  No murmur heard. Respiratory: Effort normal and breath sounds normal. He has no wheezes. He has no rales.  GI: There is no hepatosplenomegaly. There is abdominal tenderness. There is rigidity, rebound and guarding. No hernia.  Neurological: He is alert and oriented to person, place, and time.  Normal gait and station  Skin: Skin is warm. No rash noted. No cyanosis. Nails show no clubbing.  Normal skin turgor  Psychiatric: Judgment normal.  Appropriate affect    Assessment/Plan: Patient is a 68 year old male with a perforated viscus. CAD Hypertension Alcohol dependence AKI PVD  Plan: 1.  Patient  with what appears to be perforated viscus, likely either gastric or duodenal nature. 2.  We will proceed to the operating room emergently for diagnostic laparoscopy, possible ex lap 3.  Discussed with patient the risks and benefits of the procedure to include but not limited to: Infection, pain, damage structures, possible drain placement, possible need for further surgery.  Patient voiced understanding wishes to proceed.  Ralene Ok 11/07/2018, 2:59 PM

## 2018-11-07 NOTE — Progress Notes (Signed)
Pharmacy Antibiotic Note  Rodney [name] is a 68 y.o. male admitted on 11/07/2018 with perforated viscus, likely either gastric or duodenal.  Pharmacy has been consulted for zosyn dosing.  Plan: Zosyn 3.375g IV q8h (4 hour infusion).  Height: 5\' 8"  (172.7 cm) Weight: 128 lb (58.1 kg) IBW/kg (Calculated) : 68.4  Temp (24hrs), Avg:97.6 F (36.4 C), Min:97.6 F (36.4 C), Max:97.6 F (36.4 C)  Recent Labs  Lab 11/07/18 1131  WBC 7.6  CREATININE 1.03  LATICACIDVEN 1.5    Estimated Creatinine Clearance: 58 mL/min (by C-G formula based on SCr of 1.03 mg/dL).    Allergies  Allergen Reactions  . Other Rash    polyester and metals except titanium.    Antimicrobials this admission: 6/6 Zosyn>> Dose adjustments this admission:  Microbiology results: 6/6 BCx2:  6/6: COVID 19:   Thank you for allowing pharmacy to be a part of this patient's care.  Eudelia Bunch, Pharm.D 11/07/2018 3:38 PM

## 2018-11-07 NOTE — ED Notes (Signed)
CRITICAL VALUE STICKER  CRITICAL VALUE: Calciu, 4.2  DATE & TIME NOTIFIED: 11/07/18 1225  MD NOTIFIED: Sedonia Small MD and Merleen Nicely PA   TIME OF NOTIFICATION: 1225

## 2018-11-07 NOTE — Anesthesia Preprocedure Evaluation (Addendum)
Anesthesia Evaluation  Patient identified by MRN, date of birth, ID band Patient awake    Reviewed: Allergy & Precautions, NPO status , Patient's Chart, lab work & pertinent test results, reviewed documented beta blocker date and time   History of Anesthesia Complications Negative for: history of anesthetic complications  Airway Mallampati: II  TM Distance: >3 FB     Dental no notable dental hx. (+) Dental Advisory Given   Pulmonary former smoker,    Pulmonary exam normal        Cardiovascular hypertension, Pt. on medications and Pt. on home beta blockers + CAD, + Past MI and + Peripheral Vascular Disease  Normal cardiovascular exam  Cath 2017  Prox RCA to Mid RCA lesion, 15% stenosed.  Mid RCA to Dist RCA lesion, 30% stenosed.  Dist RCA lesion, 20% stenosed.  Mid LAD lesion, 40% stenosed.  There is mild left ventricular systolic dysfunction.     Neuro/Psych negative neurological ROS  negative psych ROS   GI/Hepatic (+)     substance abuse  alcohol use, Malnutrition    Endo/Other    Renal/GU Renal disease (AKI)     Musculoskeletal negative musculoskeletal ROS (+)   Abdominal   Peds  Hematology  (+) Blood dyscrasia, anemia ,   Anesthesia Other Findings Prostate cancer s/p prostatectomy  Reproductive/Obstetrics                            Anesthesia Physical  Anesthesia Plan  ASA: III and emergent  Anesthesia Plan: General   Post-op Pain Management:    Induction: Intravenous, Rapid sequence and Cricoid pressure planned  PONV Risk Score and Plan: 4 or greater and Ondansetron, Treatment may vary due to age or medical condition and Diphenhydramine  Airway Management Planned: Oral ETT  Additional Equipment: None  Intra-op Plan:   Post-operative Plan: Possible Post-op intubation/ventilation  Informed Consent: I have reviewed the patients History and Physical, chart,  labs and discussed the procedure including the risks, benefits and alternatives for the proposed anesthesia with the patient or authorized representative who has indicated his/her understanding and acceptance.     Dental advisory given  Plan Discussed with: Anesthesiologist, CRNA and Surgeon  Anesthesia Plan Comments:         Anesthesia Quick Evaluation

## 2018-11-07 NOTE — H&P (Signed)
NAME:  Rodney [name], MRN:  [id number], DOB:  1950-10-09, LOS: 0 ADMISSION DATE:  11/07/2018, CONSULTATION DATE:  11/07/2018 REFERRING MD:  Dr. Sedonia Small, ER, CHIEF COMPLAINT:  Abdominal pain   Brief History   68 yo male former smoker with hx of ETOH presents with progressive abdominal pain starting on 11/07/18.  CT abd/pelvis showed free air concerning for perforated ulcer.  He was seen by surgery in the ER who recommend diagnostic laparoscopy.  PCCM asked to admit to ICU.  History of present illness   68 yo male was in usual state of health until this morning.  He developed pain under his breast bone and in his abdomen.  This has been getting progressively worse.  Has not had cough, sputum, fever, vomiting, or diarrhea.  He drinks 3 to 6 beers per day.  He takes aspirin daily for history of CAD.    Past Medical History  Fournier's gangrene 2017, PAD, CAD s/p stent 2017, bullous pemphigoid on chronic prednisone, HTN, Prostate cancer s/p prostatectomy and XRT, HLD, C diff  Significant Hospital Events   6/06 Admit, to OR  Consults:  Surgery 6/06 Acute abdomen  Procedures:    Significant Diagnostic Tests:  CT abd/pelvis 6/06 >> irregular hepatic capsule, mild pancreatic atrophy, gastric antral wall thickening, hyperenhancement of mucosa in antral pyloric region, extensive free air (reviewed by me) CT angio chest 6/06 >> coronary atherosclerosis, dilated esophagus,centrilobular emphysema, scarring at apices, 9 mm density Rt apical region, 12 mm spiculated nodule RUL (reviewed by me)  Micro Data:  COVID 6/06 >> Blood 6/06 >>  Antimicrobials:  Zosyn 6/06 >>  Interim history/subjective:    Objective   Blood pressure 124/73, pulse 61, temperature 97.6 F (36.4 C), temperature source Oral, resp. rate 18, height 5\' 8"  (1.727 m), weight 58.1 kg, SpO2 98 %.        Intake/Output Summary (Last 24 hours) at 11/07/2018 1506 Last data filed at 11/07/2018 1426 Gross per 24 hour  Intake 2200 ml   Output -  Net 2200 ml   Filed Weights   11/07/18 1047  Weight: 58.1 kg    Examination:  General - seen in ER.  Alert. Eyes - pupils reactive ENT - no sinus tenderness, no stridor Cardiac - regular rate/rhythm, no murmur Chest - decreased BS, no wheeze/rales Abdomen - mild distention, tender in upper epigastric region, decreased bowel sounds Extremities - no cyanosis, clubbing, or edema Skin - no rashes Neuro - normal strength, moves extremities, follows commands Lymphatics - no lymphadenopathy Psych - normal mood and behavior   Resolved Hospital Problem list     Assessment & Plan:   Acute abdomen likely from perforated peptic ulcer. Plan - surgery arranging for diagnostic laparoscopy - continue IV fluids - day 1 of zosyn  Hypokalemia. Hypomagnesemia. Hypocalcemia. Plan - replace as needed  Non anion gap metabolic acidosis. Plan - f/u BMET, ABG after surgery  Anemia of critical illness. Plan - f/u CBC - transfuse for Hb < 7  Hx of CAD, HTN, PAD, HTN. Plan - hold outpt ASA, toprol, lipitor  Hx of bullous pemphigoid on chronic prednisone as outpt. Plan - might need stress dose steroids after surgery  Hx of tobacco abuse. Centrilobular emphysema. RUL spiculated nodule. Plan - oxygen to keep SpO2 > 90% - prn BDs - will need outpt follow up  Hx of ETOH. Plan - monitor for signs of withdrawal  Hyperglycemia. Plan - SSI   Best practice:  Diet: NPO DVT prophylaxis: SCDs  GI prophylaxis: Protonix Mobility: Bed rest Code Status: Full code Disposition: to OR then ICU  Labs   CBC: Recent Labs  Lab 11/07/18 1131  WBC 7.6  HGB 8.1*  HCT 25.5*  MCV 94.1  PLT 409    Basic Metabolic Panel: Recent Labs  Lab 11/07/18 1131 11/07/18 1231  NA 131*  --   K 3.0*  --   CL 115*  --   CO2 11*  --   GLUCOSE 61*  --   BUN 17  --   CREATININE 1.03  --   CALCIUM 4.2*  --   MG  --  1.0*   GFR: Estimated Creatinine Clearance: 58 mL/min (by  C-G formula based on SCr of 1.03 mg/dL). Recent Labs  Lab 11/07/18 1131  WBC 7.6  LATICACIDVEN 1.5    Liver Function Tests: Recent Labs  Lab 11/07/18 1131  AST 18  ALT 10  ALKPHOS 56  BILITOT 0.3  PROT <3.0*  ALBUMIN 1.1*   Recent Labs  Lab 11/07/18 1131  LIPASE 17   No results for input(s): AMMONIA in the last 168 hours.  ABG    Component Value Date/Time   PHART 7.435 08/31/2015 1958   PCO2ART 39.3 08/31/2015 1958   PO2ART 263.0 (H) 08/31/2015 1958   HCO3 26.7 (H) 08/31/2015 1958   TCO2 28 08/31/2015 1958   O2SAT 100.0 08/31/2015 1958     Coagulation Profile: No results for input(s): INR, PROTIME in the last 168 hours.  Cardiac Enzymes: Recent Labs  Lab 11/07/18 1135  TROPONINI <0.03    HbA1C: Hgb A1c MFr Bld  Date/Time Value Ref Range Status  09/09/2015 07:09 AM 5.8 (H) 4.8 - 5.6 % Final    Comment:    (NOTE)         Pre-diabetes: 5.7 - 6.4         Diabetes: >6.4         Glycemic control for adults with diabetes: <7.0   08/04/2015 04:47 AM 5.3 4.8 - 5.6 % Final    Comment:    (NOTE)         Pre-diabetes: 5.7 - 6.4         Diabetes: >6.4         Glycemic control for adults with diabetes: <7.0     CBG: No results for input(s): GLUCAP in the last 168 hours.  Review of Systems:   Reviewed and negative  Past Medical History  He,  has a past medical history of AKI (acute kidney injury) ([place]), Alcohol dependence ([place]), Benign essential HTN (09/01/2015), Bullous pemphigoid, CAD (coronary artery disease), Cataract, Cellulitis of scrotum, Heart murmur, Hyponatremia, Peripheral vascular disease ([place]), Prostate cancer ([place]) (05/03/2009), Radiation (07/02/11-08/22/11), Severe malnutrition ([place]), and Thrombocytopenia ([place]).   Surgical History    Past Surgical History:  Procedure Laterality Date  . APPLICATION OF A-CELL OF EXTREMITY N/A 09/25/2015   Procedure: A CELL ;  Surgeon: Loel Lofty Dillingham, DO;  Location: [facility];  Service: Plastics;  Laterality:  N/A;  . APPLICATION OF A-CELL OF EXTREMITY N/A 10/05/2015   Procedure: APPLICATION OF A-CELL TO SCROTAL, ABDOMINAL  AND PERINEAL WOUND;  Surgeon: Loel Lofty Dillingham, DO;  Location: [facility];  Service: Plastics;  Laterality: N/A;  . CARDIAC CATHETERIZATION N/A 08/04/2015   Procedure: Left Heart Cath and Coronary Angiography;  Surgeon: Charolette Forward, MD;  Location: [facility];  Service: Cardiovascular;  Laterality: N/A;  . ENDARTERECTOMY FEMORAL Left 02/17/2015   Procedure: LEFT FEMORAl ENDARTERECTOMY ;  Surgeon: Angelia Mould, MD;  Location: [facility];  Service: Vascular;  Laterality: Left;  . HEMORRHOID SURGERY    . INCISION AND DRAINAGE OF WOUND N/A 09/14/2015   Procedure: IRRIGATION AND DEBRIDEMENT SCROTAL WOUND WITH PLACEMENT OF ACELL;  Surgeon: Loel Lofty Dillingham, DO;  Location: [facility];  Service: Plastics;  Laterality: N/A;  Including Groin  . INCISION AND DRAINAGE OF WOUND N/A 09/25/2015   Procedure: IRRIGATION AND DEBRIDEMENT OF SCROTUM  WOUND;  Surgeon: Loel Lofty Dillingham, DO;  Location: [facility];  Service: Plastics;  Laterality: N/A;  . INCISION AND DRAINAGE OF WOUND N/A 10/05/2015   Procedure: IRRIGATION AND DEBRIDEMENT OF SCROTAL, ABDOMINAL  AND PERINEAL WOUND;  Surgeon: Loel Lofty Dillingham, DO;  Location: [facility];  Service: Plastics;  Laterality: N/A;  . IRRIGATION AND DEBRIDEMENT ABSCESS N/A 08/31/2015   Procedure: IRRIGATION AND DEBRIDEMENT SCROTAL  ABSCESS;  Surgeon: Kathie Rhodes, MD;  Location: [facility];  Service: Urology;  Laterality: N/A;  . IRRIGATION AND DEBRIDEMENT ABSCESS N/A 08/31/2015   Procedure: IRRIGATION AND DEBRIDEMENT ABSCESS;  Surgeon: Rolm Bookbinder, MD;  Location: [facility];  Service: General;  Laterality: N/A;  . IRRIGATION AND DEBRIDEMENT ABSCESS N/A 09/02/2015   Procedure: IRRIGATION AND DEBRIDEMENT OF SCROTUM, ABDOMEN AND PERINEUM  WITH DRESSING CHANGE;  Surgeon: Nickie Retort, MD;  Location: [facility];  Service: Urology;  Laterality: N/A;  . IRRIGATION AND  DEBRIDEMENT ABSCESS N/A 09/04/2015   Procedure: IRRIGATION AND DEBRIDEMENT and dressing change ofABSCESS;  Surgeon: Irine Seal, MD;  Location: [facility];  Service: Urology;  Laterality: N/A;  to penis and scrotum  . left eye surgery    . LEFT HEART CATHETERIZATION WITH CORONARY ANGIOGRAM N/A 02/25/2012   Procedure: LEFT HEART CATHETERIZATION WITH CORONARY ANGIOGRAM;  Surgeon: Clent Demark, MD;  Location: [facility];  Service: Cardiovascular;  Laterality: N/A;  . PATCH ANGIOPLASTY Left 02/17/2015   Procedure: VEIN PATCH ANGIOPLASTY;  Surgeon: Angelia Mould, MD;  Location: [facility];  Service: Vascular;  Laterality: Left;  . PERIPHERAL VASCULAR CATHETERIZATION N/A 02/10/2015   Procedure: Abdominal Aortogram;  Surgeon: Elam Dutch, MD;  Location: [facility];  Service: Cardiovascular;  Laterality: N/A;  . PERIPHERAL VASCULAR CATHETERIZATION Bilateral 02/10/2015   Procedure: Lower Extremity Angiography;  Surgeon: Elam Dutch, MD;  Location: [facility];  Service: Cardiovascular;  Laterality: Bilateral;  . ROBOT ASSISTED LAPAROSCOPIC RADICAL PROSTATECTOMY  05/03/2009  . WRIST SURGERY     left     Social History   reports that he quit smoking about 3 years ago. His smoking use included cigarettes. He has a 72.00 pack-year smoking history. He has never used smokeless tobacco. He reports current alcohol use of about 6.0 standard drinks of alcohol per week. He reports that he does not use drugs.   Family History   His family history includes Breast cancer in his sister; Uterine cancer (age of onset: 48) in his mother.   Allergies Allergies  Allergen Reactions  . Other Rash    polyester and metals except titanium.     Home Medications  Prior to Admission medications   Medication Sig Start Date End Date Taking? Authorizing Provider  doxycycline (VIBRAMYCIN) 100 MG capsule Take 100 mg by mouth 2 (two) times a day. 11/03/18  Yes [provider]  metoprolol succinate  (TOPROL-XL) 25 MG 24 hr tablet Take 1 tablet (25 mg total) by mouth daily. Patient taking differently: Take 25 mg by mouth 2 (two) times a day.  09/08/15  Yes Debbe Odea, MD  naproxen sodium (ALEVE) 220 MG tablet Take 220 mg by mouth daily as needed (pain).   Yes [provider]  sulfamethoxazole-trimethoprim (BACTRIM DS) 800-160 MG tablet Take 1 tablet by mouth 2 (two) times daily.   Yes [provider]  amoxicillin-clavulanate (AUGMENTIN) 875-125 MG tablet Take 1 tablet by mouth every 12 (twelve) hours. Patient not taking: Reported on 11/07/2018 09/08/15   Debbe Odea, MD  aspirin EC 81 MG EC tablet Take 1 tablet (81 mg total) by mouth daily. Patient not taking: Reported on 11/07/2018 02/27/12   Charolette Forward, MD  atorvastatin (LIPITOR) 80 MG tablet Take 1 tablet (80 mg total) by mouth daily at 6 PM. Patient not taking: Reported on 11/07/2018 02/27/12   Charolette Forward, MD  fluconazole (DIFLUCAN) 200 MG tablet Take 1 tablet (200 mg total) by mouth daily. Patient not taking: Reported on 11/07/2018 09/08/15   Debbe Odea, MD  heparin 5000 UNIT/ML injection Inject 1 mL (5,000 Units total) into the skin every 8 (eight) hours. Patient not taking: Reported on 11/07/2018 09/08/15   Debbe Odea, MD  HYDROcodone-acetaminophen (NORCO/VICODIN) 5-325 MG tablet Take 1-2 tablets by mouth every 4 (four) hours as needed for moderate pain. Patient not taking: Reported on 11/07/2018 08/22/15   Tat, Shanon Brow, MD  latanoprost (XALATAN) 0.005 % ophthalmic solution Place 1 drop into both eyes at bedtime.    [provider]  nitroGLYCERIN (NITROSTAT) 0.4 MG SL tablet Place 1 tablet (0.4 mg total) under the tongue every 5 (five) minutes x 3 doses as needed for chest pain. 02/27/12   Charolette Forward, MD  polyvinyl alcohol (LIQUIFILM TEARS) 1.4 % ophthalmic solution Place 1 drop into the right eye as needed for dry eyes. Patient not taking: Reported on 11/07/2018 09/08/15   Debbe Odea, MD  prednisoLONE acetate  (PRED FORTE) 1 % ophthalmic suspension Place 1 drop into the right eye 4 (four) times daily. Patient not taking: Reported on 11/07/2018 09/08/15   Debbe Odea, MD  predniSONE (DELTASONE) 10 MG tablet Take 1 tablet (10 mg total) by mouth daily with breakfast. Patient not taking: Reported on 11/07/2018 09/08/15   Debbe Odea, MD  Protein (PROCEL) POWD Take 2 scoop by mouth 2 (two) times daily.    [provider]  saccharomyces boulardii (FLORASTOR) 250 MG capsule Take 1 capsule (250 mg total) by mouth 2 (two) times daily. Patient not taking: Reported on 11/07/2018 09/08/15   Debbe Odea, MD  sulfamethoxazole-trimethoprim (BACTRIM DS,SEPTRA DS) 800-160 MG tablet Take 1 tablet by mouth every 12 (twelve) hours. Patient not taking: Reported on 11/07/2018 09/08/15   Debbe Odea, MD     Critical care time: 65 minutes     Chesley Mires, MD [place] 11/07/2018, 3:27 PM

## 2018-11-07 NOTE — Progress Notes (Signed)
Overall skin is in poor condition with small lesions, scabbing, etc, noted.

## 2018-11-08 ENCOUNTER — Encounter (HOSPITAL_COMMUNITY): Payer: Self-pay | Admitting: General Surgery

## 2018-11-08 DIAGNOSIS — E872 Acidosis: Secondary | ICD-10-CM

## 2018-11-08 DIAGNOSIS — L120 Bullous pemphigoid: Secondary | ICD-10-CM

## 2018-11-08 DIAGNOSIS — K251 Acute gastric ulcer with perforation: Secondary | ICD-10-CM

## 2018-11-08 LAB — BLOOD CULTURE ID PANEL (REFLEXED)

## 2018-11-08 LAB — GLUCOSE, CAPILLARY
Glucose-Capillary: 119 mg/dL — ABNORMAL HIGH (ref 70–99)
Glucose-Capillary: 139 mg/dL — ABNORMAL HIGH (ref 70–99)
Glucose-Capillary: 127 mg/dL — ABNORMAL HIGH (ref 70–99)
Glucose-Capillary: 103 mg/dL — ABNORMAL HIGH (ref 70–99)
Glucose-Capillary: 120 mg/dL — ABNORMAL HIGH (ref 70–99)
Glucose-Capillary: 114 mg/dL — ABNORMAL HIGH (ref 70–99)

## 2018-11-08 LAB — COMPREHENSIVE METABOLIC PANEL
ALT: 17 U/L (ref 0–44)
AST: 32 U/L (ref 15–41)
Albumin: 2.5 g/dL — ABNORMAL LOW (ref 3.5–5.0)
Alkaline Phosphatase: 64 U/L (ref 38–126)
Anion gap: 9 (ref 5–15)
BUN: 22 mg/dL (ref 8–23)
CO2: 15 mmol/L — ABNORMAL LOW (ref 22–32)
Calcium: 6.9 mg/dL — ABNORMAL LOW (ref 8.9–10.3)
Chloride: 106 mmol/L (ref 98–111)
Creatinine, Ser: 1.57 mg/dL — ABNORMAL HIGH (ref 0.61–1.24)
GFR calc Af Amer: 52 mL/min — ABNORMAL LOW (ref >60)
GFR calc non Af Amer: 45 mL/min — ABNORMAL LOW (ref >60)
Glucose, Bld: 115 mg/dL — ABNORMAL HIGH (ref 70–99)
Potassium: 4.5 mmol/L (ref 3.5–5.1)
Sodium: 130 mmol/L — ABNORMAL LOW (ref 135–145)
Total Bilirubin: 0.3 mg/dL (ref 0.3–1.2)
Total Protein: 4.9 g/dL — ABNORMAL LOW (ref 6.5–8.1)

## 2018-11-08 LAB — CBC
HCT: 27.3 % — ABNORMAL LOW (ref 39.0–52.0)
Hemoglobin: 8.4 g/dL — ABNORMAL LOW (ref 13.0–17.0)
MCH: 30.3 pg (ref 26.0–34.0)
MCHC: 30.8 g/dL (ref 30.0–36.0)
MCV: 98.6 fL (ref 80.0–100.0)
Platelets: 199 10*3/uL (ref 150–400)
RBC: 2.77 MIL/uL — ABNORMAL LOW (ref 4.22–5.81)
RDW: 13.4 % (ref 11.5–15.5)
WBC: 11.1 10*3/uL — ABNORMAL HIGH (ref 4.0–10.5)
nRBC: 0 % (ref 0.0–0.2)

## 2018-11-08 LAB — MAGNESIUM: Magnesium: 2 mg/dL (ref 1.7–2.4)

## 2018-11-08 LAB — BLOOD GAS, ARTERIAL
Acid-base deficit: 7 mmol/L — ABNORMAL HIGH (ref 0.0–2.0)
Bicarbonate: 16.6 mmol/L — ABNORMAL LOW (ref 20.0–28.0)
Drawn by: 514251
FIO2: 32
O2 Saturation: 98.4 %
Patient temperature: 97.1
pCO2 arterial: 26.6 mmHg — ABNORMAL LOW (ref 32.0–48.0)
pH, Arterial: 7.408 (ref 7.350–7.450)
pO2, Arterial: 117 mmHg — ABNORMAL HIGH (ref 83.0–108.0)

## 2018-11-08 LAB — URINALYSIS, ROUTINE W REFLEX MICROSCOPIC
Bacteria, UA: NONE SEEN
Bilirubin Urine: NEGATIVE
Glucose, UA: >=500 mg/dL — AB
Ketones, ur: NEGATIVE mg/dL
Nitrite: NEGATIVE
Protein, ur: 30 mg/dL — AB
Specific Gravity, Urine: 1.036 — ABNORMAL HIGH (ref 1.005–1.030)
pH: 5 (ref 5.0–8.0)

## 2018-11-08 LAB — MRSA PCR SCREENING: MRSA by PCR: POSITIVE — AB

## 2018-11-08 LAB — PHOSPHORUS: Phosphorus: 2.4 mg/dL — ABNORMAL LOW (ref 2.5–4.6)

## 2018-11-08 MED ORDER — ONDANSETRON HCL 4 MG/2ML IJ SOLN: 4.0000 mg | INTRAMUSCULAR | Status: DC | PRN

## 2018-11-08 MED ORDER — LORAZEPAM 2 MG/ML IJ SOLN: 2.0000 mg | INTRAMUSCULAR | Status: DC | PRN

## 2018-11-08 MED ORDER — ORAL CARE MOUTH RINSE
15.0000 mL | Freq: Two times a day (BID) | OROMUCOSAL | Status: DC
  Administered 2018-11-08 (×2): 15 mL via OROMUCOSAL

## 2018-11-08 MED ORDER — THIAMINE HCL 100 MG/ML IJ SOLN
100.0000 mg | Freq: Every day | INTRAMUSCULAR | Status: DC
  Administered 2018-11-08 – 2018-11-13 (×6): 100 mg via INTRAVENOUS
  Filled 2018-11-08 (×6): qty 2

## 2018-11-08 MED ORDER — CHLORHEXIDINE GLUCONATE CLOTH 2 % EX PADS: 6.0000 | MEDICATED_PAD | Freq: Every day | CUTANEOUS | Status: DC

## 2018-11-08 MED ORDER — FOLIC ACID 5 MG/ML IJ SOLN
1.0000 mg | Freq: Every day | INTRAMUSCULAR | Status: DC
  Administered 2018-11-08 – 2018-11-13 (×6): 1 mg via INTRAVENOUS
  Filled 2018-11-08 (×7): qty 0.2

## 2018-11-08 MED ORDER — LORAZEPAM 2 MG/ML IJ SOLN: 1.0000 mg | INTRAMUSCULAR | Status: DC | PRN

## 2018-11-08 MED ORDER — PANTOPRAZOLE SODIUM 40 MG IV SOLR
40.0000 mg | INTRAVENOUS | Status: DC
  Administered 2018-11-08: 40 mg via INTRAVENOUS
  Filled 2018-11-08: qty 40

## 2018-11-08 MED ORDER — MUPIROCIN 2 % EX OINT
1.0000 "application " | TOPICAL_OINTMENT | Freq: Two times a day (BID) | CUTANEOUS | Status: AC
  Administered 2018-11-08 – 2018-11-12 (×10): 1 via NASAL
  Filled 2018-11-08 (×3): qty 22

## 2018-11-08 MED ORDER — HYDROMORPHONE HCL 1 MG/ML IJ SOLN
1.0000 mg | INTRAMUSCULAR | Status: DC | PRN
  Administered 2018-11-08 – 2018-11-09 (×3): 1 mg via INTRAVENOUS
  Filled 2018-11-08 (×3): qty 1

## 2018-11-08 NOTE — Progress Notes (Signed)
PHARMACY - PHYSICIAN COMMUNICATION CRITICAL VALUE ALERT - BLOOD CULTURE IDENTIFICATION (BCID)  Rodney [name] is an 68 y.o. male who presented to [facility] on 11/07/2018   Assessment:  BCID 1/2 bottles + staph species, no mecA detected  Name of physician (or Provider) Contacted: Dr. Halford Chessman  Current antibiotics: Piperacillin/tazobactam 3.375 g IV q8h EI  Changes to prescribed antibiotics recommended: No changes at this time. Pip/tazo will cover.  Results for orders placed or performed during the hospital encounter of 11/07/18  Blood Culture ID Panel (Reflexed) (Collected: 11/07/2018 11:19 AM)  Result Value Ref Range   Enterococcus species NOT DETECTED NOT DETECTED   Listeria monocytogenes NOT DETECTED NOT DETECTED   Staphylococcus species DETECTED (A) NOT DETECTED   Staphylococcus aureus (BCID) NOT DETECTED NOT DETECTED   Methicillin resistance NOT DETECTED NOT DETECTED   Streptococcus species NOT DETECTED NOT DETECTED   Streptococcus agalactiae NOT DETECTED NOT DETECTED   Streptococcus pneumoniae NOT DETECTED NOT DETECTED   Streptococcus pyogenes NOT DETECTED NOT DETECTED   Acinetobacter baumannii NOT DETECTED NOT DETECTED   Enterobacteriaceae species NOT DETECTED NOT DETECTED   Enterobacter cloacae complex NOT DETECTED NOT DETECTED   Escherichia coli NOT DETECTED NOT DETECTED   Klebsiella oxytoca NOT DETECTED NOT DETECTED   Klebsiella pneumoniae NOT DETECTED NOT DETECTED   Proteus species NOT DETECTED NOT DETECTED   Serratia marcescens NOT DETECTED NOT DETECTED   Haemophilus influenzae NOT DETECTED NOT DETECTED   Neisseria meningitidis NOT DETECTED NOT DETECTED   Pseudomonas aeruginosa NOT DETECTED NOT DETECTED   Candida albicans NOT DETECTED NOT DETECTED   Candida glabrata NOT DETECTED NOT DETECTED   Candida krusei NOT DETECTED NOT DETECTED   Candida parapsilosis NOT DETECTED NOT DETECTED   Candida tropicalis NOT DETECTED NOT DETECTED    Lenis Noon, PharmD 11/08/2018  11:22  AM

## 2018-11-08 NOTE — Progress Notes (Addendum)
NAME:  Rodney [name], MRN:  [id number], DOB:  10/29/1950, LOS: 1 ADMISSION DATE:  11/07/2018, CONSULTATION DATE:  11/07/2018 REFERRING MD:  Dr. Sedonia Small, ER, CHIEF COMPLAINT:  Abdominal pain   Brief History   68 yo male former smoker with hx of ETOH presents with progressive abdominal pain starting on 11/07/18.  CT abd/pelvis showed free air concerning for perforated ulcer.  He was seen by surgery in the ER who recommend diagnostic laparoscopy.  PCCM asked to admit to ICU.  Past Medical History  Fournier's gangrene 2017, PAD, CAD s/p stent 2017, bullous pemphigoid on chronic prednisone, HTN, Prostate cancer s/p prostatectomy and XRT, HLD, C diff  Significant Hospital Events   6/06 Admit, to OR for Phillip Heal patch repair of perforated posterior gastric ulcer  Consults:  Surgery 6/06 Acute abdomen  Procedures:    Significant Diagnostic Tests:  CT abd/pelvis 6/06 >> irregular hepatic capsule, mild pancreatic atrophy, gastric antral wall thickening, hyperenhancement of mucosa in antral pyloric region, extensive free air (reviewed by me) CT angio chest 6/06 >> coronary atherosclerosis, dilated esophagus,centrilobular emphysema, scarring at apices, 9 mm density Rt apical region, 12 mm spiculated nodule RUL (reviewed by me)  Micro Data:  COVID 6/06 >> negative Blood 6/06 >>  Antimicrobials:  Zosyn 6/06 >> Diflucan 6/06 >>  Interim history/subjective:  Wants some coffee.  Abdomen feels better.  Not having chest pain, dyspnea, nausea.  Objective   Blood pressure 107/63, pulse (!) 59, temperature (!) 97.5 F (36.4 C), temperature source Oral, resp. rate 18, height 5\' 8"  (1.727 m), weight 59 kg, SpO2 98 %.        Intake/Output Summary (Last 24 hours) at 11/08/2018 0737 Last data filed at 11/08/2018 0600 Gross per 24 hour  Intake 7850 ml  Output 290 ml  Net 7560 ml   Filed Weights   11/07/18 1047 11/08/18 0500  Weight: 58.1 kg 59 kg    Examination:  General - alert Eyes - pupils  reactive ENT - no sinus tenderness, no stridor, NG tube in place Cardiac - regular rate/rhythm, no murmur Chest - equal breath sounds b/l, no wheezing or rales Abdomen - soft, wound dressing clean Extremities - no cyanosis, clubbing, or edema Skin - multiple areas of ecchymosis Neuro - normal strength, moves extremities, follows commands   Resolved Hospital Problem list   Hypokalemia  Assessment & Plan:   Acute abdomen likely from perforated posterior gastric ulcer s/p Delford Field. Plan - post op care, nutrition per surgery - day 2 of zosyn, diflucan - f/h H pylori - continue protonix - plan for upper GI series on 11/11/18  Non anion gap metabolic acidosis. Plan - continue IV fluids - f/u BMET  Anemia of critical illness. Plan - f/u CBC - transfuse for Hb < 7  Hx of CAD, HTN, PAD, HTN. Plan - hold outpt ASA, toprol, lipitor  Hx of bullous pemphigoid. Discussion: Not clear if he was still on steroids as outpt. Plan - hold steroids for now  Hx of tobacco abuse. Centrilobular emphysema. RUL spiculated nodule. Plan - oxygen to keep SpO2 > 90% - prn BDs - will need outpt follow up  Hx of ETOH. Plan - CIWA with prn ativan - thiamine, folic acid  Hyperglycemia. Plan - SSI  Best practice:  Diet: NPO DVT prophylaxis: SCDs GI prophylaxis: Protonix Mobility: Bed rest Code Status: Full code Disposition: ICU  Will ask [facility] to assume primary care from 6/08 and PCCM off.  Labs  CMP Latest Ref Rng & Units 11/08/2018 11/07/2018 10/10/2015  Glucose 70 - 99 mg/dL 115(H) 61(L) 119(H)  BUN 8 - 23 mg/dL 22 17 13   Creatinine 0.61 - 1.24 mg/dL 1.57(H) 1.03 0.66  Sodium 135 - 145 mmol/L 130(L) 131(L) 137  Potassium 3.5 - 5.1 mmol/L 4.5 3.0(L) 4.1  Chloride 98 - 111 mmol/L 106 115(H) 97(L)  CO2 22 - 32 mmol/L 15(L) 11(L) 31  Calcium 8.9 - 10.3 mg/dL 6.9(L) 4.2(LL) 9.2  Total Protein 6.5 - 8.1 g/dL 4.9(L) <3.0(L) -  Total Bilirubin 0.3 - 1.2 mg/dL 0.3 0.3 -   Alkaline Phos 38 - 126 U/L 64 56 -  AST 15 - 41 U/L 32 18 -  ALT 0 - 44 U/L 17 10 -   CBC Latest Ref Rng & Units 11/08/2018 11/07/2018 10/09/2015  WBC 4.0 - 10.5 K/uL 11.1(H) 7.6 13.6(H)  Hemoglobin 13.0 - 17.0 g/dL 8.4(L) 8.1(L) 8.8(L)  Hematocrit 39.0 - 52.0 % 27.3(L) 25.5(L) 29.7(L)  Platelets 150 - 400 K/uL 199 245 600(H)   ABG    Component Value Date/Time   PHART 7.408 11/08/2018 0245   PCO2ART 26.6 (L) 11/08/2018 0245   PO2ART 117 (H) 11/08/2018 0245   HCO3 16.6 (L) 11/08/2018 0245   TCO2 28 08/31/2015 1958   ACIDBASEDEF 7.0 (H) 11/08/2018 0245   O2SAT 98.4 11/08/2018 0245   CBG (last 3)  Recent Labs    11/07/18 1900 11/07/18 2351 11/08/18 0349  GLUCAP 94 Cowgill, MD [place] 11/08/2018, 7:43 AM

## 2018-11-08 NOTE — Plan of Care (Signed)
  Problem: Pain Managment: Goal: General experience of comfort will improve Outcome: Progressing   

## 2018-11-08 NOTE — Progress Notes (Signed)
1 Day Post-Op   Subjective/Chief Complaint: Patient with no acute changes. Patient with some pain as expected   Objective: Vital signs in last 24 hours: Temp:  [95.9 F (35.5 C)-97.8 F (36.6 C)] 97.5 F (36.4 C) (06/07 0351) Pulse Rate:  [49-68] 59 (06/06 2130) Resp:  [11-22] 18 (06/06 2130) BP: (88-126)/(49-92) 107/63 (06/06 2130) SpO2:  [94 %-100 %] 98 % (06/06 2130) Weight:  [58.1 kg-59 kg] 59 kg (06/07 0500) Last BM Date: 11/03/18  Intake/Output from previous day: 06/06 0701 - 06/07 0700 In: 7850 [I.V.:3800; IV Piggyback:3850] Out: 290 [Urine:90; Drains:150; Blood:50] Intake/Output this shift: No intake/output data recorded.  General appearance: alert and cooperative GI: Soft, appropriate tenderness to palpation, midline wound clean dry and intact, JP serosanguineous  Lab Results:  Recent Labs    11/07/18 1131 11/08/18 0205  WBC 7.6 11.1*  HGB 8.1* 8.4*  HCT 25.5* 27.3*  PLT 245 199   BMET Recent Labs    11/07/18 1131 11/08/18 0205  NA 131* 130*  K 3.0* 4.5  CL 115* 106  CO2 11* 15*  GLUCOSE 61* 115*  BUN 17 22  CREATININE 1.03 1.57*  CALCIUM 4.2* 6.9*   PT/INR Recent Labs    11/07/18 1457  LABPROT 16.2*  INR 1.3*   ABG Recent Labs    11/07/18 1505 11/08/18 0245  PHART  --  7.408  HCO3 22.5 16.6*    Studies/Results: Ct Angio Chest Pe W And/or Wo Contrast  Result Date: 11/07/2018 CLINICAL DATA:  Sharp epigastric pain radiating laterally and right lower quadrant. EXAM: CT ANGIOGRAPHY CHEST CT ABDOMEN AND PELVIS WITH CONTRAST TECHNIQUE: Multidetector CT imaging of the chest was performed using the standard protocol during bolus administration of intravenous contrast. Multiplanar CT image reconstructions and MIPs were obtained to evaluate the vascular anatomy. Multidetector CT imaging of the abdomen and pelvis was performed using the standard protocol during bolus administration of intravenous contrast. CONTRAST:  <See Chart> OMNIPAQUE IOHEXOL  300 MG/ML SOLN, 123mL OMNIPAQUE IOHEXOL 350 MG/ML SOLN COMPARISON:  Chest radiograph of earlier today. Abdominopelvic CT of 08/15/2015. No prior chest CT. FINDINGS: CTA CHEST FINDINGS Cardiovascular: The quality of this exam for evaluation of pulmonary embolism is good. No evidence of pulmonary embolism. Aortic and branch vessel atherosclerosis. Tortuous thoracic aorta. Mild cardiomegaly, with no pericardial effusion. Multivessel coronary artery atherosclerosis. Mediastinum/Nodes: No mediastinal or hilar adenopathy. Dilated esophagus with fluid level within. Lungs/Pleura: Trace right pleural fluid. Mild motion degradation inferiorly. 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 Pleuroparenchymal scarring at the apices. A right apical irregular density measures 9 mm on 32/12 and is favored to be related to pleuroparenchymal scarring. Spiculated posterolateral right upper lobe density measures maximally 12 mm, including on image 57/12. Musculoskeletal: Left greater than right mild gynecomastia. Osteopenia. Distal left clavicular fracture is remote. Mild superior endplate compression deformities at multiple upper thoracic levels. Review of the MIP images confirms the above findings. CT ABDOMEN and PELVIS FINDINGS Hepatobiliary: Subtle irregular hepatic capsule. No focal liver lesion. Normal gallbladder, without biliary ductal dilatation. Pancreas: Mild pancreatic atrophy, without duct dilatation or acute inflammation. Spleen: Normal in size, without focal abnormality. Adrenals/Urinary Tract: Normal adrenal glands. Normal kidneys, without hydronephrosis. Decompressed urinary bladder Stomach/Bowel: Gastric antral wall thickening, accentuated by underdistention. Example image 24/3. Possible gas within the posterior wall of the gastric antrum on 25/3. Mucosal hyperenhancement is suspected within the antral pyloric region including on 26/3. Normal colon and terminal ileum. Normal caliber of small bowel loops. Suggestion of  mild mucosal hyperenhancement  within mid ileal loops in the right hemipelvis on image 64/3. Vascular/Lymphatic: Advanced aortic and branch vessel atherosclerosis. Surgical changes within the left groin in the region of left femoral artery dilatation at 1.6 cm today versus 1.4 cm in 2017. No abdominopelvic adenopathy. Reproductive: Prostatectomy Other: Small volume abdominopelvic fluid. Extensive free intraperitoneal air, including within the upper abdomen on image 17/3, within the lesser sac on image 24/3. Musculoskeletal: Osteopenia. Moderate L5 compression deformity, new since 2017 Review of the MIP images confirms the above findings. IMPRESSION: 1. Extensive free intraperitoneal air and fluid throughout the abdomen and pelvis. Favored etiology is peptic ulcer disease, possibly in the gastric antrum or less likely pylorus. More mild mucosal hyperenhancement in the ileum may represent enteritis but is felt less likely to be the cause of the free intraperitoneal air. These results were called by telephone at the time of interpretation on 11/07/2018 at 1:59 pm to [facility] , who verbally acknowledged these results. 2.  No evidence of pulmonary embolism. 3. Right upper lobe irregular opacity which could represent scarring or a primary bronchogenic carcinoma. Consider outpatient PET. 4. Aortic atherosclerosis (ICD10-I70.0), coronary artery atherosclerosis and emphysema (ICD10-J43.9). 5. Osteopenia with thoracolumbar compression deformities. 6. Esophageal air fluid level suggests dysmotility or gastroesophageal reflux. 7. Possible mild cirrhosis.  Correlate with risk factors. Electronically Signed   By: Abigail Miyamoto M.D.   On: 11/07/2018 13:59   Ct Abdomen Pelvis W Contrast  Result Date: 11/07/2018 CLINICAL DATA:  Sharp epigastric pain radiating laterally and right lower quadrant. EXAM: CT ANGIOGRAPHY CHEST CT ABDOMEN AND PELVIS WITH CONTRAST TECHNIQUE: Multidetector CT imaging of the chest was performed using the  standard protocol during bolus administration of intravenous contrast. Multiplanar CT image reconstructions and MIPs were obtained to evaluate the vascular anatomy. Multidetector CT imaging of the abdomen and pelvis was performed using the standard protocol during bolus administration of intravenous contrast. CONTRAST:  <See Chart> OMNIPAQUE IOHEXOL 300 MG/ML SOLN, 127mL OMNIPAQUE IOHEXOL 350 MG/ML SOLN COMPARISON:  Chest radiograph of earlier today. Abdominopelvic CT of 08/15/2015. No prior chest CT. FINDINGS: CTA CHEST FINDINGS Cardiovascular: The quality of this exam for evaluation of pulmonary embolism is good. No evidence of pulmonary embolism. Aortic and branch vessel atherosclerosis. Tortuous thoracic aorta. Mild cardiomegaly, with no pericardial effusion. Multivessel coronary artery atherosclerosis. Mediastinum/Nodes: No mediastinal or hilar adenopathy. Dilated esophagus with fluid level within. Lungs/Pleura: Trace right pleural fluid. Mild motion degradation inferiorly. 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 Pleuroparenchymal scarring at the apices. A right apical irregular density measures 9 mm on 32/12 and is favored to be related to pleuroparenchymal scarring. Spiculated posterolateral right upper lobe density measures maximally 12 mm, including on image 57/12. Musculoskeletal: Left greater than right mild gynecomastia. Osteopenia. Distal left clavicular fracture is remote. Mild superior endplate compression deformities at multiple upper thoracic levels. Review of the MIP images confirms the above findings. CT ABDOMEN and PELVIS FINDINGS Hepatobiliary: Subtle irregular hepatic capsule. No focal liver lesion. Normal gallbladder, without biliary ductal dilatation. Pancreas: Mild pancreatic atrophy, without duct dilatation or acute inflammation. Spleen: Normal in size, without focal abnormality. Adrenals/Urinary Tract: Normal adrenal glands. Normal kidneys, without hydronephrosis. Decompressed urinary  bladder Stomach/Bowel: Gastric antral wall thickening, accentuated by underdistention. Example image 24/3. Possible gas within the posterior wall of the gastric antrum on 25/3. Mucosal hyperenhancement is suspected within the antral pyloric region including on 26/3. Normal colon and terminal ileum. Normal caliber of small bowel loops. Suggestion of mild mucosal hyperenhancement within mid ileal loops in the  right hemipelvis on image 64/3. Vascular/Lymphatic: Advanced aortic and branch vessel atherosclerosis. Surgical changes within the left groin in the region of left femoral artery dilatation at 1.6 cm today versus 1.4 cm in 2017. No abdominopelvic adenopathy. Reproductive: Prostatectomy Other: Small volume abdominopelvic fluid. Extensive free intraperitoneal air, including within the upper abdomen on image 17/3, within the lesser sac on image 24/3. Musculoskeletal: Osteopenia. Moderate L5 compression deformity, new since 2017 Review of the MIP images confirms the above findings. IMPRESSION: 1. Extensive free intraperitoneal air and fluid throughout the abdomen and pelvis. Favored etiology is peptic ulcer disease, possibly in the gastric antrum or less likely pylorus. More mild mucosal hyperenhancement in the ileum may represent enteritis but is felt less likely to be the cause of the free intraperitoneal air. These results were called by telephone at the time of interpretation on 11/07/2018 at 1:59 pm to [facility] , who verbally acknowledged these results. 2.  No evidence of pulmonary embolism. 3. Right upper lobe irregular opacity which could represent scarring or a primary bronchogenic carcinoma. Consider outpatient PET. 4. Aortic atherosclerosis (ICD10-I70.0), coronary artery atherosclerosis and emphysema (ICD10-J43.9). 5. Osteopenia with thoracolumbar compression deformities. 6. Esophageal air fluid level suggests dysmotility or gastroesophageal reflux. 7. Possible mild cirrhosis.  Correlate with risk factors.  Electronically Signed   By: Abigail Miyamoto M.D.   On: 11/07/2018 13:59   Dg Chest Port 1 View  Result Date: 11/07/2018 CLINICAL DATA:  Pain and dehydration EXAM: PORTABLE CHEST 1 VIEW COMPARISON:  September 04, 2015 FINDINGS: There is no edema or consolidation. Heart size and pulmonary vascularity are normal. No adenopathy. There is evidence of a prior fracture of the lateral left clavicle. IMPRESSION: No edema or consolidation.  Stable cardiac silhouette. Electronically Signed   By: Lowella Grip III M.D.   On: 11/07/2018 12:40    Anti-infectives: Anti-infectives (From admission, onward)   Start     Dose/Rate Route Frequency Ordered Stop   11/07/18 2230  fluconazole (DIFLUCAN) IVPB 200 mg     200 mg 100 mL/hr over 60 Minutes Intravenous Daily at bedtime 11/07/18 2216     11/07/18 2200  piperacillin-tazobactam (ZOSYN) IVPB 3.375 g     3.375 g 12.5 mL/hr over 240 Minutes Intravenous Every 8 hours 11/07/18 1541     11/07/18 1400  piperacillin-tazobactam (ZOSYN) IVPB 3.375 g     3.375 g 100 mL/hr over 30 Minutes Intravenous  Once 11/07/18 1359 11/07/18 1517      Assessment/Plan: s/p Procedure(s): LAPAROSCOPY DIAGNOSTIC (N/A) EXPLORATORY LAPAROTOMY gram patch gastric ulcer (N/A) -Continue n.p.o., NG tube -Expect alcohol withdrawal symptoms -We will plan on upper GI Wednesday to evaluate for gastric ulcer repair -Continue antibiotics/antifungals -Follow-up H pylori test  11/08/2018

## 2018-11-09 ENCOUNTER — Encounter (HOSPITAL_COMMUNITY): Payer: Self-pay | Admitting: Surgery

## 2018-11-09 DIAGNOSIS — K255 Chronic or unspecified gastric ulcer with perforation: Secondary | ICD-10-CM

## 2018-11-09 DIAGNOSIS — I739 Peripheral vascular disease, unspecified: Secondary | ICD-10-CM

## 2018-11-09 DIAGNOSIS — L8990 Pressure ulcer of unspecified site, unspecified stage: Secondary | ICD-10-CM

## 2018-11-09 DIAGNOSIS — I2510 Atherosclerotic heart disease of native coronary artery without angina pectoris: Secondary | ICD-10-CM

## 2018-11-09 DIAGNOSIS — F1010 Alcohol abuse, uncomplicated: Secondary | ICD-10-CM

## 2018-11-09 DIAGNOSIS — I10 Essential (primary) hypertension: Secondary | ICD-10-CM

## 2018-11-09 LAB — CULTURE, BLOOD (ROUTINE X 2)

## 2018-11-09 LAB — BASIC METABOLIC PANEL
Anion gap: 6 (ref 5–15)
BUN: 18 mg/dL (ref 8–23)
CO2: 18 mmol/L — ABNORMAL LOW (ref 22–32)
Calcium: 7.4 mg/dL — ABNORMAL LOW (ref 8.9–10.3)
Chloride: 110 mmol/L (ref 98–111)
Creatinine, Ser: 1.3 mg/dL — ABNORMAL HIGH (ref 0.61–1.24)
GFR calc Af Amer: >60 mL/min (ref >60)
GFR calc non Af Amer: 57 mL/min — ABNORMAL LOW (ref >60)
Glucose, Bld: 137 mg/dL — ABNORMAL HIGH (ref 70–99)
Potassium: 4.6 mmol/L (ref 3.5–5.1)
Sodium: 134 mmol/L — ABNORMAL LOW (ref 135–145)

## 2018-11-09 LAB — CBC
HCT: 24 % — ABNORMAL LOW (ref 39.0–52.0)
Hemoglobin: 7.6 g/dL — ABNORMAL LOW (ref 13.0–17.0)
MCH: 30.5 pg (ref 26.0–34.0)
MCHC: 31.7 g/dL (ref 30.0–36.0)
MCV: 96.4 fL (ref 80.0–100.0)
Platelets: 175 10*3/uL (ref 150–400)
RBC: 2.49 MIL/uL — ABNORMAL LOW (ref 4.22–5.81)
RDW: 13.8 % (ref 11.5–15.5)
WBC: 9.7 10*3/uL (ref 4.0–10.5)
nRBC: 0 % (ref 0.0–0.2)

## 2018-11-09 LAB — GLUCOSE, CAPILLARY
Glucose-Capillary: 100 mg/dL — ABNORMAL HIGH (ref 70–99)
Glucose-Capillary: 108 mg/dL — ABNORMAL HIGH (ref 70–99)
Glucose-Capillary: 122 mg/dL — ABNORMAL HIGH (ref 70–99)
Glucose-Capillary: 123 mg/dL — ABNORMAL HIGH (ref 70–99)
Glucose-Capillary: 157 mg/dL — ABNORMAL HIGH (ref 70–99)
Glucose-Capillary: 68 mg/dL — ABNORMAL LOW (ref 70–99)
Glucose-Capillary: 81 mg/dL (ref 70–99)

## 2018-11-09 MED ORDER — DEXTROSE 50 % IV SOLN
INTRAVENOUS | Status: AC
  Administered 2018-11-09: 25 mL
  Filled 2018-11-09: qty 50

## 2018-11-09 MED ORDER — CHLORHEXIDINE GLUCONATE CLOTH 2 % EX PADS
6.0000 | MEDICATED_PAD | Freq: Every day | CUTANEOUS | Status: DC
  Administered 2018-11-09 – 2018-11-15 (×7): 6 via TOPICAL

## 2018-11-09 MED ORDER — METHOCARBAMOL 1000 MG/10ML IJ SOLN
500.0000 mg | Freq: Three times a day (TID) | INTRAVENOUS | Status: DC | PRN
  Filled 2018-11-09: qty 5

## 2018-11-09 MED ORDER — PANTOPRAZOLE SODIUM 40 MG IV SOLR
40.0000 mg | Freq: Two times a day (BID) | INTRAVENOUS | Status: DC
  Administered 2018-11-09 – 2018-11-13 (×9): 40 mg via INTRAVENOUS
  Filled 2018-11-09 (×9): qty 40

## 2018-11-09 NOTE — Progress Notes (Signed)
Hypoglycemic Event  CBG: 68 at 0741  Treatment: 25 mL of dextrose 50 % solution  Symptoms: None assessed  Follow-up CBG: Time:0818 CBG Result:122  Possible Reasons for Event: NPO

## 2018-11-09 NOTE — Progress Notes (Addendum)
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  CT abdomen pelvis obtained in the ED showed free air concerning for perforated ulcer as well as irregular hepatic capsule/gastric antral wall thickening and mild pancreatic atrophy.  Patient admitted to ICU and he was evaluated by surgery, underwent diagnostic laparoscopy followed by exploratory laparotomy with Phillip Heal patch repair of perforated posterior gastric ulcer.  Patient transferred out of ICU on June 7 and hospitalist resuming care today.  Patient started on alcohol withdrawal monitoring protocol by intensivist service given history of alcohol use.  Subjective: Postoperative day 2 and patient has NG tube in place.  He is complaining of abdominal soreness and denies passing gas or having BM today.  He is awake alert and oriented.  He has had dry mouth and using mouth swabs  Objective: Vitals:   11/09/18 0800 11/09/18 0900 11/09/18 1000 11/09/18 1100  BP: 129/72 136/68 121/78 125/78  Pulse: 84 87 80 76  Resp: 16 16 14 12   Temp: 97.7 F (36.5 C)     TempSrc: Oral     SpO2: 99% 100% 100% 100%  Weight:      Height:        Intake/Output Summary (Last 24 hours) at 11/09/2018 1230 Last data filed at 11/09/2018 1000 Gross per 24 hour  Intake 2293.13 ml  Output 615 ml  Net 1678.13 ml   Filed Weights   11/07/18 1047 11/08/18 0500 11/09/18 0500  Weight: 58.1 kg 59 kg 59.8 kg    Physical Examination:  General exam: Appears calm and cooperative, no apparent distress Respiratory system: Clear to auscultation. Respiratory effort normal. Cardiovascular system: S1 & S2  heard, RRR. No JVD, murmurs, rubs, gallops or clicks. No pedal edema. Gastrointestinal system: Abdomen is nondistended, midline surgical scar with staples/dressing, and expected surrounding tenderness. No organomegaly or masses felt.  Bowel sounds heard. Central nervous system: Alert and oriented. No focal neurological deficits. Extremities: Symmetric 5 x 5 power. Skin: No rashes, lesions or ulcers Psychiatry: Judgement and insight appear normal. Mood & affect appropriate.     Data Reviewed: I have personally reviewed following labs and imaging studies  CBC: Recent Labs  Lab 11/07/18 1131 11/08/18 0205 11/09/18 0310  WBC 7.6 11.1* 9.7  HGB 8.1* 8.4* 7.6*  HCT 25.5* 27.3* 24.0*  MCV 94.1 98.6 96.4  PLT 245 199 683   Basic Metabolic Panel: Recent Labs  Lab 11/07/18 1131 11/07/18 1231 11/08/18 0205 11/09/18 0310  NA 131*  --  130* 134*  K 3.0*  --  4.5 4.6  CL 115*  --  106 110  CO2 11*  --  15* 18*  GLUCOSE 61*  --  115* 137*  BUN 17  --  22 18  CREATININE 1.03  --  1.57* 1.30*  CALCIUM 4.2*  --  6.9* 7.4*  MG  --  1.0* 2.0  --   PHOS  --   --  2.4*  --    GFR: Estimated Creatinine Clearance: 47.3 mL/min (A) (by C-G formula based on SCr of 1.3 mg/dL (H)). Liver Function Tests: Recent Labs  Lab 11/07/18 1131 11/08/18 0205  AST 18 32  ALT 10 17  ALKPHOS  56 64  BILITOT 0.3 0.3  PROT <3.0* 4.9*  ALBUMIN 1.1* 2.5*   Recent Labs  Lab 11/07/18 1131  LIPASE 17   No results for input(s): AMMONIA in the last 168 hours. Coagulation Profile: Recent Labs  Lab 11/07/18 1457  INR 1.3*   Cardiac Enzymes: Recent Labs  Lab 11/07/18 1135  TROPONINI <0.03   BNP (last 3 results) No results for input(s): PROBNP in the last 8760 hours. HbA1C: No results for input(s): HGBA1C in the last 72 hours. CBG: Recent Labs  Lab 11/08/18 2323 11/09/18 0309 11/09/18 0741 11/09/18 0818 11/09/18 1138  GLUCAP 114* 123* 68* 122* 157*   Lipid Profile: No results for  input(s): CHOL, HDL, LDLCALC, TRIG, CHOLHDL, LDLDIRECT in the last 72 hours. Thyroid Function Tests: No results for input(s): TSH, T4TOTAL, FREET4, T3FREE, THYROIDAB in the last 72 hours. Anemia Panel: No results for input(s): VITAMINB12, FOLATE, FERRITIN, TIBC, IRON, RETICCTPCT in the last 72 hours. Sepsis Labs: Recent Labs  Lab 11/07/18 1131  LATICACIDVEN 1.5    Recent Results (from the past 240 hour(s))  SARS Coronavirus 2 (CEPHEID- Performed in [facility] hospital lab), Hosp Order     Status: None   Collection Time: 11/07/18 11:18 AM  Result Value Ref Range Status   SARS Coronavirus 2 NEGATIVE NEGATIVE Final    Comment: (NOTE) If result is NEGATIVE SARS-CoV-2 target nucleic acids are NOT DETECTED. The SARS-CoV-2 RNA is generally detectable in upper and lower  respiratory specimens during the acute phase of infection. The lowest  concentration of SARS-CoV-2 viral copies this assay can detect is 250  copies / mL. A negative result does not preclude SARS-CoV-2 infection  and should not be used as the sole basis for treatment or other  patient management decisions.  A negative result may occur with  improper specimen collection / handling, submission of specimen other  than nasopharyngeal swab, presence of viral mutation(s) within the  areas targeted by this assay, and inadequate number of viral copies  (<250 copies / mL). A negative result must be combined with clinical  observations, patient history, and epidemiological information. If result is POSITIVE SARS-CoV-2 target nucleic acids are DETECTED. The SARS-CoV-2 RNA is generally detectable in upper and lower  respiratory specimens dur ing the acute phase of infection.  Positive  results are indicative of active infection with SARS-CoV-2.  Clinical  correlation with patient history and other diagnostic information is  necessary to determine patient infection status.  Positive results do  not rule out bacterial infection or  co-infection with other viruses. If result is PRESUMPTIVE POSTIVE SARS-CoV-2 nucleic acids MAY BE PRESENT.   A presumptive positive result was obtained on the submitted specimen  and confirmed on repeat testing.  While 2019 novel coronavirus  (SARS-CoV-2) nucleic acids may be present in the submitted sample  additional confirmatory testing may be necessary for epidemiological  and / or clinical management purposes  to differentiate between  SARS-CoV-2 and other Sarbecovirus currently known to infect humans.  If clinically indicated additional testing with an alternate test  methodology [phone]) is advised. The SARS-CoV-2 RNA is generally  detectable in upper and lower respiratory sp ecimens during the acute  phase of infection. The expected result is Negative. Fact Sheet for Patients:  StrictlyIdeas.no Fact Sheet for Healthcare Providers: BankingDealers.co.za This test is not yet approved or cleared by the [place] FDA and has been authorized for detection and/or diagnosis of SARS-CoV-2 by FDA under an Emergency Use Authorization (EUA).  This  EUA will remain in effect (meaning this test can be used) for the duration of the COVID-19 declaration under Section 564(b)(1) of the Act, 21 U.S.C. section 360bbb-3(b)(1), unless the authorization is terminated or revoked sooner. Performed at [facility], [place] [address] [place], [place] [address]   Blood culture (routine x 2)     Status: None (Preliminary result)   Collection Time: 11/07/18 11:19 AM  Result Value Ref Range Status   Specimen Description   Final    BLOOD LEFT ANTECUBITAL Performed at [facility], [place] [address], [place], [place] [address]    Special Requests   Final    BOTTLES DRAWN AEROBIC AND ANAEROBIC Blood Culture results may not be optimal due to an inadequate volume of blood received in culture bottles Performed at [place] [address] [place], [place] [address]    Culture  Setup Time   Final    GRAM POSITIVE COCCI AEROBIC BOTTLE ONLY CRITICAL RESULT CALLED TO, READ BACK BY AND VERIFIED WITH: SWAYNE  PHARMD AT 1112 ON [id number] BY SJW    Culture   Final    GRAM POSITIVE COCCI IDENTIFICATION TO FOLLOW Performed at [facility], [place] [address] [place], [place] [address]    Report Status PENDING  Incomplete  Blood Culture ID Panel (Reflexed)     Status: Abnormal   Collection Time: 11/07/18 11:19 AM  Result Value Ref Range Status   Enterococcus species NOT DETECTED NOT DETECTED Final   Listeria monocytogenes NOT DETECTED NOT DETECTED Final   Staphylococcus species DETECTED (A) NOT DETECTED Final    Comment: Methicillin (oxacillin) susceptible coagulase negative staphylococcus. Possible blood culture contaminant (unless isolated from more than one blood culture draw or clinical case suggests pathogenicity). No antibiotic treatment is indicated for blood  culture contaminants. CRITICAL RESULT CALLED TO, READ BACK BY AND VERIFIED WITH: SWAYNE PHARMD AT 1114 ON [id number] BY SJW    Staphylococcus aureus (BCID) NOT DETECTED NOT DETECTED Final   Methicillin resistance NOT DETECTED NOT DETECTED Final   Streptococcus species NOT DETECTED NOT DETECTED Final   Streptococcus agalactiae NOT DETECTED NOT DETECTED Final   Streptococcus pneumoniae NOT DETECTED NOT DETECTED Final   Streptococcus pyogenes NOT DETECTED NOT DETECTED Final   Acinetobacter baumannii NOT DETECTED NOT DETECTED Final   Enterobacteriaceae species NOT DETECTED NOT DETECTED Final   Enterobacter cloacae complex NOT DETECTED NOT DETECTED Final   Escherichia coli NOT DETECTED NOT DETECTED Final   Klebsiella oxytoca NOT DETECTED NOT DETECTED Final   Klebsiella pneumoniae NOT DETECTED NOT DETECTED Final   Proteus species NOT DETECTED NOT DETECTED Final   Serratia marcescens NOT DETECTED NOT DETECTED Final   Haemophilus influenzae NOT  DETECTED NOT DETECTED Final   Neisseria meningitidis NOT DETECTED NOT DETECTED Final   Pseudomonas aeruginosa NOT DETECTED NOT DETECTED Final   Candida albicans NOT DETECTED NOT DETECTED Final   Candida glabrata NOT DETECTED NOT DETECTED Final   Candida krusei NOT DETECTED NOT DETECTED Final   Candida parapsilosis NOT DETECTED NOT DETECTED Final   Candida tropicalis NOT DETECTED NOT DETECTED Final    Comment: Performed at [facility], [address] [place], [place] [address]  MRSA PCR Screening     Status: Abnormal   Collection Time: 11/07/18 11:46 PM  Result Value Ref Range Status   MRSA by PCR POSITIVE (A) NEGATIVE Final    Comment:        The GeneXpert MRSA Assay (  FDA approved for NASAL specimens only), is one component of a comprehensive MRSA colonization surveillance program. It is not intended to diagnose MRSA infection nor to guide or monitor treatment for MRSA infections. RESULT CALLED TO, READ BACK BY AND VERIFIED WITH: LANEY,S RN @0233  ON 11/08/2018 JACKSON,K Performed at [facility], [place] [address] [place], [place] [address]       Radiology Studies: Ct Angio Chest Pe W And/or Wo Contrast  Result Date: 11/07/2018 CLINICAL DATA:  Sharp epigastric pain radiating laterally and right lower quadrant. EXAM: CT ANGIOGRAPHY CHEST CT ABDOMEN AND PELVIS WITH CONTRAST TECHNIQUE: Multidetector CT imaging of the chest was performed using the standard protocol during bolus administration of intravenous contrast. Multiplanar CT image reconstructions and MIPs were obtained to evaluate the vascular anatomy. Multidetector CT imaging of the abdomen and pelvis was performed using the standard protocol during bolus administration of intravenous contrast. CONTRAST:  <See Chart> OMNIPAQUE IOHEXOL 300 MG/ML SOLN, 116mL OMNIPAQUE IOHEXOL 350 MG/ML SOLN COMPARISON:  Chest radiograph of earlier today. Abdominopelvic CT of 08/15/2015. No prior chest CT. FINDINGS: CTA CHEST  FINDINGS Cardiovascular: The quality of this exam for evaluation of pulmonary embolism is good. No evidence of pulmonary embolism. Aortic and branch vessel atherosclerosis. Tortuous thoracic aorta. Mild cardiomegaly, with no pericardial effusion. Multivessel coronary artery atherosclerosis. Mediastinum/Nodes: No mediastinal or hilar adenopathy. Dilated esophagus with fluid level within. Lungs/Pleura: Trace right pleural fluid. Mild motion degradation inferiorly. 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 Pleuroparenchymal scarring at the apices. A right apical irregular density measures 9 mm on 32/12 and is favored to be related to pleuroparenchymal scarring. Spiculated posterolateral right upper lobe density measures maximally 12 mm, including on image 57/12. Musculoskeletal: Left greater than right mild gynecomastia. Osteopenia. Distal left clavicular fracture is remote. Mild superior endplate compression deformities at multiple upper thoracic levels. Review of the MIP images confirms the above findings. CT ABDOMEN and PELVIS FINDINGS Hepatobiliary: Subtle irregular hepatic capsule. No focal liver lesion. Normal gallbladder, without biliary ductal dilatation. Pancreas: Mild pancreatic atrophy, without duct dilatation or acute inflammation. Spleen: Normal in size, without focal abnormality. Adrenals/Urinary Tract: Normal adrenal glands. Normal kidneys, without hydronephrosis. Decompressed urinary bladder Stomach/Bowel: Gastric antral wall thickening, accentuated by underdistention. Example image 24/3. Possible gas within the posterior wall of the gastric antrum on 25/3. Mucosal hyperenhancement is suspected within the antral pyloric region including on 26/3. Normal colon and terminal ileum. Normal caliber of small bowel loops. Suggestion of mild mucosal hyperenhancement within mid ileal loops in the right hemipelvis on image 64/3. Vascular/Lymphatic: Advanced aortic and branch vessel atherosclerosis. Surgical  changes within the left groin in the region of left femoral artery dilatation at 1.6 cm today versus 1.4 cm in 2017. No abdominopelvic adenopathy. Reproductive: Prostatectomy Other: Small volume abdominopelvic fluid. Extensive free intraperitoneal air, including within the upper abdomen on image 17/3, within the lesser sac on image 24/3. Musculoskeletal: Osteopenia. Moderate L5 compression deformity, new since 2017 Review of the MIP images confirms the above findings. IMPRESSION: 1. Extensive free intraperitoneal air and fluid throughout the abdomen and pelvis. Favored etiology is peptic ulcer disease, possibly in the gastric antrum or less likely pylorus. More mild mucosal hyperenhancement in the ileum may represent enteritis but is felt less likely to be the cause of the free intraperitoneal air. These results were called by telephone at the time of interpretation on 11/07/2018 at 1:59 pm to [facility] , who verbally acknowledged these results. 2.  No evidence of pulmonary embolism. 3. Right upper  lobe irregular opacity which could represent scarring or a primary bronchogenic carcinoma. Consider outpatient PET. 4. Aortic atherosclerosis (ICD10-I70.0), coronary artery atherosclerosis and emphysema (ICD10-J43.9). 5. Osteopenia with thoracolumbar compression deformities. 6. Esophageal air fluid level suggests dysmotility or gastroesophageal reflux. 7. Possible mild cirrhosis.  Correlate with risk factors. Electronically Signed   By: Abigail Miyamoto M.D.   On: 11/07/2018 13:59   Ct Abdomen Pelvis W Contrast  Result Date: 11/07/2018 CLINICAL DATA:  Sharp epigastric pain radiating laterally and right lower quadrant. EXAM: CT ANGIOGRAPHY CHEST CT ABDOMEN AND PELVIS WITH CONTRAST TECHNIQUE: Multidetector CT imaging of the chest was performed using the standard protocol during bolus administration of intravenous contrast. Multiplanar CT image reconstructions and MIPs were obtained to evaluate the vascular anatomy.  Multidetector CT imaging of the abdomen and pelvis was performed using the standard protocol during bolus administration of intravenous contrast. CONTRAST:  <See Chart> OMNIPAQUE IOHEXOL 300 MG/ML SOLN, 179mL OMNIPAQUE IOHEXOL 350 MG/ML SOLN COMPARISON:  Chest radiograph of earlier today. Abdominopelvic CT of 08/15/2015. No prior chest CT. FINDINGS: CTA CHEST FINDINGS Cardiovascular: The quality of this exam for evaluation of pulmonary embolism is good. No evidence of pulmonary embolism. Aortic and branch vessel atherosclerosis. Tortuous thoracic aorta. Mild cardiomegaly, with no pericardial effusion. Multivessel coronary artery atherosclerosis. Mediastinum/Nodes: No mediastinal or hilar adenopathy. Dilated esophagus with fluid level within. Lungs/Pleura: Trace right pleural fluid. Mild motion degradation inferiorly. 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 Pleuroparenchymal scarring at the apices. A right apical irregular density measures 9 mm on 32/12 and is favored to be related to pleuroparenchymal scarring. Spiculated posterolateral right upper lobe density measures maximally 12 mm, including on image 57/12. Musculoskeletal: Left greater than right mild gynecomastia. Osteopenia. Distal left clavicular fracture is remote. Mild superior endplate compression deformities at multiple upper thoracic levels. Review of the MIP images confirms the above findings. CT ABDOMEN and PELVIS FINDINGS Hepatobiliary: Subtle irregular hepatic capsule. No focal liver lesion. Normal gallbladder, without biliary ductal dilatation. Pancreas: Mild pancreatic atrophy, without duct dilatation or acute inflammation. Spleen: Normal in size, without focal abnormality. Adrenals/Urinary Tract: Normal adrenal glands. Normal kidneys, without hydronephrosis. Decompressed urinary bladder Stomach/Bowel: Gastric antral wall thickening, accentuated by underdistention. Example image 24/3. Possible gas within the posterior wall of the gastric  antrum on 25/3. Mucosal hyperenhancement is suspected within the antral pyloric region including on 26/3. Normal colon and terminal ileum. Normal caliber of small bowel loops. Suggestion of mild mucosal hyperenhancement within mid ileal loops in the right hemipelvis on image 64/3. Vascular/Lymphatic: Advanced aortic and branch vessel atherosclerosis. Surgical changes within the left groin in the region of left femoral artery dilatation at 1.6 cm today versus 1.4 cm in 2017. No abdominopelvic adenopathy. Reproductive: Prostatectomy Other: Small volume abdominopelvic fluid. Extensive free intraperitoneal air, including within the upper abdomen on image 17/3, within the lesser sac on image 24/3. Musculoskeletal: Osteopenia. Moderate L5 compression deformity, new since 2017 Review of the MIP images confirms the above findings. IMPRESSION: 1. Extensive free intraperitoneal air and fluid throughout the abdomen and pelvis. Favored etiology is peptic ulcer disease, possibly in the gastric antrum or less likely pylorus. More mild mucosal hyperenhancement in the ileum may represent enteritis but is felt less likely to be the cause of the free intraperitoneal air. These results were called by telephone at the time of interpretation on 11/07/2018 at 1:59 pm to [facility] , who verbally acknowledged these results. 2.  No evidence of pulmonary embolism. 3. Right upper lobe irregular opacity which could represent  scarring or a primary bronchogenic carcinoma. Consider outpatient PET. 4. Aortic atherosclerosis (ICD10-I70.0), coronary artery atherosclerosis and emphysema (ICD10-J43.9). 5. Osteopenia with thoracolumbar compression deformities. 6. Esophageal air fluid level suggests dysmotility or gastroesophageal reflux. 7. Possible mild cirrhosis.  Correlate with risk factors. Electronically Signed   By: Abigail Miyamoto M.D.   On: 11/07/2018 13:59        Scheduled Meds:  Chlorhexidine Gluconate Cloth  6 each Topical Daily    fentaNYL (SUBLIMAZE) injection  50 mcg Intravenous Once   folic acid  1 mg Intravenous Daily   insulin aspart  2-6 Units Subcutaneous Q4H   latanoprost  1 drop Both Eyes QHS   mupirocin ointment  1 application Nasal BID   pantoprazole (PROTONIX) IV  40 mg Intravenous Q12H   thiamine injection  100 mg Intravenous Daily   Continuous Infusions:  dextrose 5% lactated ringers with KCl 20 mEq/L 100 mL/hr at 11/09/18 1000   fluconazole (DIFLUCAN) IV Stopped (11/08/18 2220)   methocarbamol (ROBAXIN) IV     piperacillin-tazobactam (ZOSYN)  IV Stopped (11/09/18 2035)    Assessment & Plan:    1.  Perforated gastric ulcer: In the setting of chronic steroid/NSAID/aspirin/alcohol and tobacco use.  Status post surgical intervention with [facility].  Remains NPO, on NG tube to intermittent suction.  Continue IV fluids.  Can DC fluids when cleared for diet by surgery.  On IV PPI/Zosyn/Diflucan since admission(can possibly stop Diflucan if okay with surgery)  2.  Hypertension/CAD: Status post PTCA in 2017.  Aspirin held in view of problem #1.  Blood pressure stable without beta-blockers.  Can resume Lipitor and metoprolol when able to take p.o.  3.  Chronic bullous pemphigoid: Patient on prednisone per home med list but patient reported not actively taking this medication prior to this admission.?  PRN use  4.  Peripheral arterial disease: Aspirin/statins to be resumed when able to take p.o. and okay per surgery  5.  Alcohol abuse: Watch for withdrawal signs.  On CIWA protocol  6.  Tobacco use/emphysematous changes on CT: Nebulizer treatments PRN  DVT prophylaxis: SCDs Code Status: Full code Family / Patient Communication: Discussed with patient & all questions answered. Disposition Plan: Home when medically stable     LOS: 2 days    Time spent: 35 minutes    [place]ford Shi, MD [facility] Pager [phone]  If 7PM-7AM, please contact  night-coverage [email or web address] Password [email or web address] 11/09/2018, 12:30 PM

## 2018-11-09 NOTE — Progress Notes (Addendum)
[facility] Progress Note  2 Days Post-Op  Subjective: CC-  Comfortable this morning. Abdomen sore but pain medication is helping. Denies n/v. No flatus or BM. Did not get out of bed yesterday.  Objective: Vital signs in last 24 hours: Temp:  [97.4 F (36.3 C)-97.7 F (36.5 C)] 97.7 F (36.5 C) (06/08 0800) Pulse Rate:  [61-89] 87 (06/08 0900) Resp:  [10-20] 16 (06/08 0900) BP: (84-153)/(50-109) 136/68 (06/08 0900) SpO2:  [76 %-100 %] 100 % (06/08 0900) Weight:  [59.8 kg] 59.8 kg (06/08 0500) Last BM Date: (PTA )  Intake/Output from previous day: 06/07 0701 - 06/08 0700 In: 824 [I.V.:777.2; IV Piggyback:46.9] Out: 850 [Urine:400; Emesis/NG output:230; Drains:220] Intake/Output this shift: No intake/output data recorded.  PE: Gen:  Alert, NAD, pleasant HEENT: EOM's intact Card:  RRR Pulm:  CTAB, no W/R/R, effort normal Abd: Soft, mild distension, appropriately tender, few BS heard, midline cdi, JP drain output serosanguinous  Skin: warm and dry  Lab Results:  Recent Labs    11/08/18 0205 11/09/18 0310  WBC 11.1* 9.7  HGB 8.4* 7.6*  HCT 27.3* 24.0*  PLT 199 175   BMET Recent Labs    11/08/18 0205 11/09/18 0310  NA 130* 134*  K 4.5 4.6  CL 106 110  CO2 15* 18*  GLUCOSE 115* 137*  BUN 22 18  CREATININE 1.57* 1.30*  CALCIUM 6.9* 7.4*   PT/INR Recent Labs    11/07/18 1457  LABPROT 16.2*  INR 1.3*   CMP     Component Value Date/Time   NA 134 (L) 11/09/2018 0310   K 4.6 11/09/2018 0310   CL 110 11/09/2018 0310   CO2 18 (L) 11/09/2018 0310   GLUCOSE 137 (H) 11/09/2018 0310   BUN 18 11/09/2018 0310   CREATININE 1.30 (H) 11/09/2018 0310   CALCIUM 7.4 (L) 11/09/2018 0310   PROT 4.9 (L) 11/08/2018 0205   ALBUMIN 2.5 (L) 11/08/2018 0205   AST 32 11/08/2018 0205   ALT 17 11/08/2018 0205   ALKPHOS 64 11/08/2018 0205   BILITOT 0.3 11/08/2018 0205   GFRNONAA 57 (L) 11/09/2018 0310   GFRAA >60 11/09/2018 0310   Lipase     Component  Value Date/Time   LIPASE 17 11/07/2018 1131       Studies/Results: Ct Angio Chest Pe W And/or Wo Contrast  Result Date: 11/07/2018 CLINICAL DATA:  Sharp epigastric pain radiating laterally and right lower quadrant. EXAM: CT ANGIOGRAPHY CHEST CT ABDOMEN AND PELVIS WITH CONTRAST TECHNIQUE: Multidetector CT imaging of the chest was performed using the standard protocol during bolus administration of intravenous contrast. Multiplanar CT image reconstructions and MIPs were obtained to evaluate the vascular anatomy. Multidetector CT imaging of the abdomen and pelvis was performed using the standard protocol during bolus administration of intravenous contrast. CONTRAST:  <See Chart> OMNIPAQUE IOHEXOL 300 MG/ML SOLN, 164mL OMNIPAQUE IOHEXOL 350 MG/ML SOLN COMPARISON:  Chest radiograph of earlier today. Abdominopelvic CT of 08/15/2015. No prior chest CT. FINDINGS: CTA CHEST FINDINGS Cardiovascular: The quality of this exam for evaluation of pulmonary embolism is good. No evidence of pulmonary embolism. Aortic and branch vessel atherosclerosis. Tortuous thoracic aorta. Mild cardiomegaly, with no pericardial effusion. Multivessel coronary artery atherosclerosis. Mediastinum/Nodes: No mediastinal or hilar adenopathy. Dilated esophagus with fluid level within. Lungs/Pleura: Trace right pleural fluid. Mild motion degradation inferiorly. 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 Pleuroparenchymal scarring at the apices. A right apical irregular density measures 9 mm on 32/12 and is favored to be related to pleuroparenchymal  scarring. Spiculated posterolateral right upper lobe density measures maximally 12 mm, including on image 57/12. Musculoskeletal: Left greater than right mild gynecomastia. Osteopenia. Distal left clavicular fracture is remote. Mild superior endplate compression deformities at multiple upper thoracic levels. Review of the MIP images confirms the above findings. CT ABDOMEN and PELVIS FINDINGS  Hepatobiliary: Subtle irregular hepatic capsule. No focal liver lesion. Normal gallbladder, without biliary ductal dilatation. Pancreas: Mild pancreatic atrophy, without duct dilatation or acute inflammation. Spleen: Normal in size, without focal abnormality. Adrenals/Urinary Tract: Normal adrenal glands. Normal kidneys, without hydronephrosis. Decompressed urinary bladder Stomach/Bowel: Gastric antral wall thickening, accentuated by underdistention. Example image 24/3. Possible gas within the posterior wall of the gastric antrum on 25/3. Mucosal hyperenhancement is suspected within the antral pyloric region including on 26/3. Normal colon and terminal ileum. Normal caliber of small bowel loops. Suggestion of mild mucosal hyperenhancement within mid ileal loops in the right hemipelvis on image 64/3. Vascular/Lymphatic: Advanced aortic and branch vessel atherosclerosis. Surgical changes within the left groin in the region of left femoral artery dilatation at 1.6 cm today versus 1.4 cm in 2017. No abdominopelvic adenopathy. Reproductive: Prostatectomy Other: Small volume abdominopelvic fluid. Extensive free intraperitoneal air, including within the upper abdomen on image 17/3, within the lesser sac on image 24/3. Musculoskeletal: Osteopenia. Moderate L5 compression deformity, new since 2017 Review of the MIP images confirms the above findings. IMPRESSION: 1. Extensive free intraperitoneal air and fluid throughout the abdomen and pelvis. Favored etiology is peptic ulcer disease, possibly in the gastric antrum or less likely pylorus. More mild mucosal hyperenhancement in the ileum may represent enteritis but is felt less likely to be the cause of the free intraperitoneal air. These results were called by telephone at the time of interpretation on 11/07/2018 at 1:59 pm to [facility] , who verbally acknowledged these results. 2.  No evidence of pulmonary embolism. 3. Right upper lobe irregular opacity which could represent  scarring or a primary bronchogenic carcinoma. Consider outpatient PET. 4. Aortic atherosclerosis (ICD10-I70.0), coronary artery atherosclerosis and emphysema (ICD10-J43.9). 5. Osteopenia with thoracolumbar compression deformities. 6. Esophageal air fluid level suggests dysmotility or gastroesophageal reflux. 7. Possible mild cirrhosis.  Correlate with risk factors. Electronically Signed   By: Abigail Miyamoto M.D.   On: 11/07/2018 13:59   Ct Abdomen Pelvis W Contrast  Result Date: 11/07/2018 CLINICAL DATA:  Sharp epigastric pain radiating laterally and right lower quadrant. EXAM: CT ANGIOGRAPHY CHEST CT ABDOMEN AND PELVIS WITH CONTRAST TECHNIQUE: Multidetector CT imaging of the chest was performed using the standard protocol during bolus administration of intravenous contrast. Multiplanar CT image reconstructions and MIPs were obtained to evaluate the vascular anatomy. Multidetector CT imaging of the abdomen and pelvis was performed using the standard protocol during bolus administration of intravenous contrast. CONTRAST:  <See Chart> OMNIPAQUE IOHEXOL 300 MG/ML SOLN, 150mL OMNIPAQUE IOHEXOL 350 MG/ML SOLN COMPARISON:  Chest radiograph of earlier today. Abdominopelvic CT of 08/15/2015. No prior chest CT. FINDINGS: CTA CHEST FINDINGS Cardiovascular: The quality of this exam for evaluation of pulmonary embolism is good. No evidence of pulmonary embolism. Aortic and branch vessel atherosclerosis. Tortuous thoracic aorta. Mild cardiomegaly, with no pericardial effusion. Multivessel coronary artery atherosclerosis. Mediastinum/Nodes: No mediastinal or hilar adenopathy. Dilated esophagus with fluid level within. Lungs/Pleura: Trace right pleural fluid. Mild motion degradation inferiorly. 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 Pleuroparenchymal scarring at the apices. A right apical irregular density measures 9 mm on 32/12 and is favored to be related to pleuroparenchymal scarring. Spiculated posterolateral right upper  lobe density measures maximally 12 mm, including on image 57/12. Musculoskeletal: Left greater than right mild gynecomastia. Osteopenia. Distal left clavicular fracture is remote. Mild superior endplate compression deformities at multiple upper thoracic levels. Review of the MIP images confirms the above findings. CT ABDOMEN and PELVIS FINDINGS Hepatobiliary: Subtle irregular hepatic capsule. No focal liver lesion. Normal gallbladder, without biliary ductal dilatation. Pancreas: Mild pancreatic atrophy, without duct dilatation or acute inflammation. Spleen: Normal in size, without focal abnormality. Adrenals/Urinary Tract: Normal adrenal glands. Normal kidneys, without hydronephrosis. Decompressed urinary bladder Stomach/Bowel: Gastric antral wall thickening, accentuated by underdistention. Example image 24/3. Possible gas within the posterior wall of the gastric antrum on 25/3. Mucosal hyperenhancement is suspected within the antral pyloric region including on 26/3. Normal colon and terminal ileum. Normal caliber of small bowel loops. Suggestion of mild mucosal hyperenhancement within mid ileal loops in the right hemipelvis on image 64/3. Vascular/Lymphatic: Advanced aortic and branch vessel atherosclerosis. Surgical changes within the left groin in the region of left femoral artery dilatation at 1.6 cm today versus 1.4 cm in 2017. No abdominopelvic adenopathy. Reproductive: Prostatectomy Other: Small volume abdominopelvic fluid. Extensive free intraperitoneal air, including within the upper abdomen on image 17/3, within the lesser sac on image 24/3. Musculoskeletal: Osteopenia. Moderate L5 compression deformity, new since 2017 Review of the MIP images confirms the above findings. IMPRESSION: 1. Extensive free intraperitoneal air and fluid throughout the abdomen and pelvis. Favored etiology is peptic ulcer disease, possibly in the gastric antrum or less likely pylorus. More mild mucosal hyperenhancement in the  ileum may represent enteritis but is felt less likely to be the cause of the free intraperitoneal air. These results were called by telephone at the time of interpretation on 11/07/2018 at 1:59 pm to [facility] , who verbally acknowledged these results. 2.  No evidence of pulmonary embolism. 3. Right upper lobe irregular opacity which could represent scarring or a primary bronchogenic carcinoma. Consider outpatient PET. 4. Aortic atherosclerosis (ICD10-I70.0), coronary artery atherosclerosis and emphysema (ICD10-J43.9). 5. Osteopenia with thoracolumbar compression deformities. 6. Esophageal air fluid level suggests dysmotility or gastroesophageal reflux. 7. Possible mild cirrhosis.  Correlate with risk factors. Electronically Signed   By: Abigail Miyamoto M.D.   On: 11/07/2018 13:59   Dg Chest Port 1 View  Result Date: 11/07/2018 CLINICAL DATA:  Pain and dehydration EXAM: PORTABLE CHEST 1 VIEW COMPARISON:  September 04, 2015 FINDINGS: There is no edema or consolidation. Heart size and pulmonary vascularity are normal. No adenopathy. There is evidence of a prior fracture of the lateral left clavicle. IMPRESSION: No edema or consolidation.  Stable cardiac silhouette. Electronically Signed   By: Lowella Grip III M.D.   On: 11/07/2018 12:40    Anti-infectives: Anti-infectives (From admission, onward)   Start     Dose/Rate Route Frequency Ordered Stop   11/07/18 2230  fluconazole (DIFLUCAN) IVPB 200 mg     200 mg 100 mL/hr over 60 Minutes Intravenous Daily at bedtime 11/07/18 2216     11/07/18 2200  piperacillin-tazobactam (ZOSYN) IVPB 3.375 g     3.375 g 12.5 mL/hr over 240 Minutes Intravenous Every 8 hours 11/07/18 1541     11/07/18 1400  piperacillin-tazobactam (ZOSYN) IVPB 3.375 g     3.375 g 100 mL/hr over 30 Minutes Intravenous  Once 11/07/18 1359 11/07/18 1517       Assessment/Plan Fournier's gangrene 2017 PAD CAD s/p stent 2017 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 Rosendo Gros - POD 2 - H pylori pending - continue IV protonix  - await return in bowel function, continue NPO/NGT to LIWS - JP drain out serosanguinous  - Needs to mobilize. PT consult - continue zosyn/diflucan - Plan for UGI Wednesday.  ID - zosyn 6/6>>, diflucan 6/6>> FEN - IVF, NPO/NGT VTE - SCDs Foley - d/c 6/8 Follow up - Dr. Rosendo Gros   LOS: 2 days    Wellington Hampshire , [facility] 11/09/2018, 9:43 AM Pager: [phone] Mon-Thurs 7:00 am-4:30 pm Fri 7:00 am -11:30 AM Sat-Sun 7:00 am-11:30 am

## 2018-11-09 NOTE — Progress Notes (Signed)
Inpatient Diabetes Program Recommendations  AACE/ADA: New Consensus Statement on Inpatient Glycemic Control (2015)  Target Ranges:  Prepandial:   less than 140 mg/dL      Peak postprandial:   less than 180 mg/dL (1-2 hours)      Critically ill patients:  140 - 180 mg/dL   Results for Ruis, COLIE FUGITT (MRN [id number]) as of 11/09/2018 10:01  Ref. Range 11/07/2018 23:51 11/08/2018 03:49 11/08/2018 08:17 11/08/2018 11:44 11/08/2018 15:30 11/08/2018 19:40  Glucose-Capillary Latest Ref Range: 70 - 99 mg/dL 96 119 (H) 139 (H) 127 (H)  2 units NOVOLOG  103 (H) 120 (H)   Results for Luhrs, JOHNATHYN VISCOMI (MRN [id number]) as of 11/09/2018 10:01  Ref. Range 11/08/2018 23:23 11/09/2018 03:09 11/09/2018 07:41 11/09/2018 08:18  Glucose-Capillary Latest Ref Range: 70 - 99 mg/dL 114 (H) 123 (H)  2 units NOVOLOG  68 (L) 122 (H)    Admit with: Perforated posterior gastric ulcer   Current Orders: Novolog 2-4-6 units Q4 hours per the ICU Glycemic Control Order set      MD- Note patient with Mild Hypoglycemia at 8am today after patient received 2 units Novolog SSI at 3am today for CBG of 123 mg/dl.  Requiring very little Novolog SSI and NO History of Diabetes noted.  Please consider discontinuing Novolog SSI for now and Continue CBG checks Q4 hours      --Will follow patient during hospitalization--  Wyn Quaker RN, MSN, CDE Diabetes Coordinator Inpatient Glycemic Control Team Team Pager: [phone] (8a-5p)

## 2018-11-09 NOTE — Consult Note (Addendum)
[facility] Nurse wound consult note Reason for Consult: leg wound, history of necrotizing fasciitis. History of bullous pemphigoid. Not followed by MD   Wound type: full thickness wound left inner thigh, multiple scabs over both of his legs, reported by the patient to be pruritic. He states "it started like a scab with some blisters, I popped it and now its a hole". Does not appear to be infected no redness or induration.  Pressure Injury POA: NA Measurement: 2.5cm x 1.0cm x 1.5cm Wound bed: subcutaneous tissue, slough Drainage (amount, consistency, odor) serosanguinous  Periwound: intact, two scabbed area lateral to the to the open wound, two scabbed areas on the left posterior thigh also Dressing procedure/placement/frequency: Add silver hydrofiber packed to serve as a wick for drainage and for the antimicrobial effects.  Cover with foam. Change packing daily.   Teach patient to pack wound  Follow up with the wound care center, patient is interested in an appointment with the wound care center here. I will contact them for an appointment.   Discussed POC with patient and bedside nurse.  Re consult if needed, will not follow at this time. Thanks  Mikiya Nebergall [employer], RN,CWOCN, CNS, [place] [phone])   Appointment made for June 29th, 2020 at [facility]. [place],  [id number] [phone]

## 2018-11-10 DIAGNOSIS — L89302 Pressure ulcer of unspecified buttock, stage 2: Secondary | ICD-10-CM

## 2018-11-10 LAB — GLUCOSE, CAPILLARY
Glucose-Capillary: 107 mg/dL — ABNORMAL HIGH (ref 70–99)
Glucose-Capillary: 133 mg/dL — ABNORMAL HIGH (ref 70–99)
Glucose-Capillary: 75 mg/dL (ref 70–99)
Glucose-Capillary: 103 mg/dL — ABNORMAL HIGH (ref 70–99)
Glucose-Capillary: 106 mg/dL — ABNORMAL HIGH (ref 70–99)
Glucose-Capillary: 109 mg/dL — ABNORMAL HIGH (ref 70–99)

## 2018-11-10 LAB — H. PYLORI ANTIBODY, IGG: H Pylori IgG: 0.49 Index Value (ref 0.00–0.79)

## 2018-11-10 MED ORDER — DEXTROSE-NACL 5-0.45 % IV SOLN
INTRAVENOUS | Status: DC
  Administered 2018-11-10 – 2018-11-14 (×5): via INTRAVENOUS

## 2018-11-10 MED ORDER — SODIUM CHLORIDE 0.9% FLUSH
10.0000 mL | Freq: Two times a day (BID) | INTRAVENOUS | Status: DC
  Administered 2018-11-10 – 2018-11-16 (×6): 10 mL

## 2018-11-10 MED ORDER — SODIUM CHLORIDE 0.9 % IV SOLN
INTRAVENOUS | Status: DC | PRN
  Administered 2018-11-10 – 2018-11-15 (×2): 250 mL via INTRAVENOUS

## 2018-11-10 MED ORDER — LORAZEPAM 2 MG/ML IJ SOLN
1.0000 mg | INTRAMUSCULAR | Status: DC | PRN
  Administered 2018-11-11 – 2018-11-12 (×4): 1 mg via INTRAVENOUS
  Filled 2018-11-10 (×4): qty 1

## 2018-11-10 MED ORDER — SODIUM CHLORIDE 0.9% FLUSH: 10.0000 mL | INTRAVENOUS | Status: DC | PRN

## 2018-11-10 NOTE — Progress Notes (Signed)
Attempted to get patient OOB to chair ... he refused, discussed the many benefits from early mobilization with him.  His plan is not to mobilize until he has had 2 or 3 days of eating.  Stressed that he may need Rehab at discharge if becomes weak from bedrest.  He feels that will not be necessary and that he will go home to get his strength back.

## 2018-11-10 NOTE — Progress Notes (Signed)
Initial Nutrition Assessment  RD working remotely.   DOCUMENTATION CODES:   Not applicable  INTERVENTION:  - diet advancement as medically feasible. - if unable to advance to at least CLD in the next 48-72 hours, may need to consider nutrition support.    NUTRITION DIAGNOSIS:   Inadequate oral intake related to inability to eat as evidenced by NPO status.  GOAL:   Patient will meet greater than or equal to 90% of their needs  MONITOR:   Diet advancement, Labs, Weight trends, Skin, I & O's  REASON FOR ASSESSMENT:   Other (Comment)(Pressure Injury report)  ASSESSMENT:    68 year old male with history of HTN, prostate cancer s/p prostatectomy/radiation treatment, hyperlipidemia, CAD s/p PTCA, PAD, bullous pemphigus on chronic prednisone, emphysema, smoking and alcohol abuse. He presented to the ED on 6/6 with complaints of abdominal pain.  CT abdomen/pelvis in the ED showed free air concerning for perforated ulcer, irregular hepatic capsule, gastric antral wall thickening, and mild pancreatic atrophy.  Patient admitted to ICU and he was evaluated by surgery, underwent diagnostic laparoscopy followed by ex lap with Phillip Heal patch repair of perforated posterior gastric ulcer. Patient started on alcohol withdrawal monitoring protocol given history of alcohol abuse.  Patient has been NPO since admission. He is POD #3 diagnostic laparoscopy, ex lap, graham patch repair of perforated gastric ulcer. NGT placed 6/6 at 5:00 PM and flow sheet indicates 450 ml output during night shift.   Per chart review, current weight is +10 lb compared to admission weight. The most recent weight PTA was from [facility] on 11/18/16 when patient weighed 128 lb (admission weight was consistent with this weight).  Per notes: plan for upper GI tomorrow, continue NGT to LIS while awaiting return of bowel function.    Medications reviewed; 1 mg IV folic acid/day, sliding scale novolog, 100 mg IV thiamine/day.  Labs  reviewed; CBGs: 107 and 133 mg/dl today, Na: 134 mmol/l, creatinine: 1.3 mg/dl, Ca: 7.4 mg/dl, GFR: 57 ml/min. IVF; D5-LR-20 mEq KCl @ 75 ml/hr (306 kcal).     NUTRITION - FOCUSED PHYSICAL EXAM:  unable to complete at this time.   Diet Order:   Diet Order            Diet NPO time specified Except for: Ice Chips  Diet effective now              EDUCATION NEEDS:   Not appropriate for education at this time  Skin:  Skin Assessment: Skin Integrity Issues: Skin Integrity Issues:: Stage II, Incisions Stage II: L buttocks Incisions: abdomen (6/6)  Last BM:  PTA/unknown  Height:   Ht Readings from Last 1 Encounters:  11/07/18 5\' 8"  (1.727 m)    Weight:   Wt Readings from Last 1 Encounters:  11/10/18 62.8 kg    Ideal Body Weight:  70 kg  BMI:  Body mass index is 21.05 kg/m.  Estimated Nutritional Needs:   Kcal:  8768-1157 kcal  Protein:  90-105 grams  Fluid:  >/= 2 L/day     Jarome Matin, MS, RD, LDN, [facility] Inpatient Clinical Dietitian Pager # [phone] After hours/weekend pager # [phone]

## 2018-11-10 NOTE — Evaluation (Signed)
Physical Therapy Evaluation Patient Details Name: Rodney [name] MRN: [id number] DOB: [birth year] Today's Date: 11/10/2018   History of Present Illness  68 yo male admitted with perforated gastric ulcer s/p exp lap, omental patch 11/07/18, ETOH WD. Hx of PVD, CAD, prostate Ca, ETOH abuse  Clinical Impression  Bed level eval only. Pt refused all mobility except UE/LE assessment, exercises. Max encouragement from therapist for pt to start mobilizing on today however he was not agreeable. Will continue to follow and progress activity as pt will allow. Will update d/c recommendations as necessary.     Follow Up Recommendations Home health PT;Supervision/Assistance - 24 hour (may need SNF-will continue to assess)    Equipment Recommendations  None recommended by PT    Recommendations for Other Services       Precautions / Restrictions Precautions Precautions: Fall Precaution Comments: L jp drain, NG tube Restrictions Weight Bearing Restrictions: No      Mobility  Bed Mobility               General bed mobility comments: NT-pt refused  Transfers                    Ambulation/Gait                Stairs            Wheelchair Mobility    Modified Rankin (Stroke Patients Only)       Balance                                             Pertinent Vitals/Pain Pain Assessment: Faces Faces Pain Scale: Hurts even more Pain Location: abdomen at rest Pain Descriptors / Indicators: Sore;Discomfort Pain Intervention(s): Monitored during session    Home Living Family/patient expects to be discharged to:: Private residence Living Arrangements: Alone   Type of Home: House Home Access: Stairs to enter Entrance Stairs-Rails: Psychiatric nurse of Steps: 4 Home Layout: Multi-level;Able to live on main level with bedroom/bathroom Home Equipment: Gilford Rile - 2 wheels;Cane - single point      Prior Function Level of  Independence: Independent with assistive device(s)         Comments: uses cane in community     Hand Dominance        Extremity/Trunk Assessment   Upper Extremity Assessment Upper Extremity Assessment: Overall WFL for tasks assessed    Lower Extremity Assessment Lower Extremity Assessment: Generalized weakness       Communication   Communication: No difficulties  Cognition Arousal/Alertness: Awake/alert Behavior During Therapy: WFL for tasks assessed/performed Overall Cognitive Status: Within Functional Limits for tasks assessed                                        General Comments      Exercises General Exercises - Upper Extremity Shoulder Flexion: AROM;Both;5 reps;Supine Elbow Flexion: AROM;Both;5 reps;Supine General Exercises - Lower Extremity Ankle Circles/Pumps: AROM;Both;10 reps;Supine Heel Slides: AROM;Both;10 reps;Supine   Assessment/Plan    PT Assessment Patient needs continued PT services  PT Problem List Decreased strength;Decreased balance;Decreased mobility;Pain;Decreased activity tolerance;Decreased knowledge of use of DME       PT Treatment Interventions Functional mobility training;DME instruction;Gait training;Therapeutic activities;Balance training;Patient/family education;Therapeutic exercise    PT Goals (Current goals  can be found in the Care Plan section)  Acute Rehab PT Goals Patient Stated Goal: less pain. to get something to eat. PT Goal Formulation: With patient Time For Goal Achievement: 11/24/18 Potential to Achieve Goals: Good    Frequency Min 3X/week   Barriers to discharge        Co-evaluation               AM-PAC PT "6 Clicks" Mobility  Outcome Measure Help needed turning from your back to your side while in a flat bed without using bedrails?: A Lot Help needed moving from lying on your back to sitting on the side of a flat bed without using bedrails?: A Lot Help needed moving to and from a  bed to a chair (including a wheelchair)?: A Lot Help needed standing up from a chair using your arms (e.g., wheelchair or bedside chair)?: A Lot Help needed to walk in hospital room?: A Lot Help needed climbing 3-5 steps with a railing? : A Lot 6 Click Score: 12    End of Session   Activity Tolerance: Patient tolerated treatment well Patient left: in bed;with call bell/phone within reach;with bed alarm set   PT Visit Diagnosis: Muscle weakness (generalized) (M62.81);Pain;Difficulty in walking, not elsewhere classified (R26.2) Pain - part of body: (abdomen)    Time: 0034-9179 PT Time Calculation (min) (ACUTE ONLY): 17 min   Charges:   PT Evaluation $PT Eval Moderate Complexity: [place], PT Acute Rehabilitation Services Pager: [phone] Office: [phone]

## 2018-11-10 NOTE — Progress Notes (Signed)
Pharmacy Antibiotic Note  Rodney [name] is a 68 y.o. male admitted on 11/07/2018 with perforated gastric ulcer.  Pharmacy had been consulted for zosyn dosing. Now POD3 EmergencyLAPAROSCOPY DIAGNOSTIC (N/A), EXPLORATORY LAPAROTOMYand Graham patch repair of perforated posterior gastric ulcer   Today, 11/10/18  Day 3 Zosyn and Diflucan  Afebrile  WBC WNL  SCr 1.3 CrCl 50  CNS in 1 blood culture - contaminant  Plan: Continue current Zosyn dosing ?Discontinue Diflucan at this point  Height: 5\' 8"  (172.7 cm) Weight: 138 lb 7.2 oz (62.8 kg) IBW/kg (Calculated) : 68.4  Temp (24hrs), Avg:97.9 F (36.6 C), Min:97.7 F (36.5 C), Max:98.2 F (36.8 C)  Recent Labs  Lab 11/07/18 1131 11/08/18 0205 11/09/18 0310  WBC 7.6 11.1* 9.7  CREATININE 1.03 1.57* 1.30*  LATICACIDVEN 1.5  --   --     Estimated Creatinine Clearance: 49.6 mL/min (A) (by C-G formula based on SCr of 1.3 mg/dL (H)).    Allergies  Allergen Reactions  . Other Rash    polyester and metals except titanium.    Antimicrobials this admission: 6/6 Zosyn>> 6/6 Diflucan >>  Dose adjustments this admission:  Microbiology results: 6/6 BCx2: BCID aerobic bottle only > GP cocci > MSCNS 6/6: COVID 19: neg 6/6 MRSA PCR positive 6/7 H pylori antibody: pending   Thank you for allowing pharmacy to be a part of this patient's care.  Adrian Saran, PharmD, BCPS 11/10/2018 7:40 AM

## 2018-11-10 NOTE — Progress Notes (Signed)
[facility] Progress Note  3 Days Post-Op  Subjective: CC-  No complaints this morning. States that he feels great. Abdominal pain well controlled. No flatus or BM. NG with 450cc out last 24 hours.   Objective: Vital signs in last 24 hours: Temp:  [97.7 F (36.5 C)-98.2 F (36.8 C)] 98.2 F (36.8 C) (06/09 0400) Pulse Rate:  [76-107] 88 (06/09 0600) Resp:  [8-16] 15 (06/09 0600) BP: (109-149)/(66-89) 149/89 (06/09 0600) SpO2:  [95 %-100 %] 100 % (06/09 0600) Weight:  [62.8 kg] 62.8 kg (06/09 0500) Last BM Date: (PTA)  Intake/Output from previous day: 06/08 0701 - 06/09 0700 In: 4101.6 [P.O.:360; I.V.:3294.1; IV Piggyback:447.5] Out: 865 [Urine:250; Emesis/NG output:450; Drains:165] Intake/Output this shift: No intake/output data recorded.  PE: Gen:  Alert, NAD HEENT: EOM's intact Card:  RRR Pulm:  CTAB, no W/R/R, effort normal Abd: Soft, mild distension, appropriately tender, few BS heard, midline cdi, JP drain output serosanguinous  Skin: warm and dry   Lab Results:  Recent Labs    11/08/18 0205 11/09/18 0310  WBC 11.1* 9.7  HGB 8.4* 7.6*  HCT 27.3* 24.0*  PLT 199 175   BMET Recent Labs    11/08/18 0205 11/09/18 0310  NA 130* 134*  K 4.5 4.6  CL 106 110  CO2 15* 18*  GLUCOSE 115* 137*  BUN 22 18  CREATININE 1.57* 1.30*  CALCIUM 6.9* 7.4*   PT/INR Recent Labs    11/07/18 1457  LABPROT 16.2*  INR 1.3*   CMP     Component Value Date/Time   NA 134 (L) 11/09/2018 0310   K 4.6 11/09/2018 0310   CL 110 11/09/2018 0310   CO2 18 (L) 11/09/2018 0310   GLUCOSE 137 (H) 11/09/2018 0310   BUN 18 11/09/2018 0310   CREATININE 1.30 (H) 11/09/2018 0310   CALCIUM 7.4 (L) 11/09/2018 0310   PROT 4.9 (L) 11/08/2018 0205   ALBUMIN 2.5 (L) 11/08/2018 0205   AST 32 11/08/2018 0205   ALT 17 11/08/2018 0205   ALKPHOS 64 11/08/2018 0205   BILITOT 0.3 11/08/2018 0205   GFRNONAA 57 (L) 11/09/2018 0310   GFRAA >60 11/09/2018 0310   Lipase      Component Value Date/Time   LIPASE 17 11/07/2018 1131       Studies/Results: No results found.  Anti-infectives: Anti-infectives (From admission, onward)   Start     Dose/Rate Route Frequency Ordered Stop   11/07/18 2230  fluconazole (DIFLUCAN) IVPB 200 mg     200 mg 100 mL/hr over 60 Minutes Intravenous Daily at bedtime 11/07/18 2216     11/07/18 2200  piperacillin-tazobactam (ZOSYN) IVPB 3.375 g     3.375 g 12.5 mL/hr over 240 Minutes Intravenous Every 8 hours 11/07/18 1541     11/07/18 1400  piperacillin-tazobactam (ZOSYN) IVPB 3.375 g     3.375 g 100 mL/hr over 30 Minutes Intravenous  Once 11/07/18 1359 11/07/18 1517       Assessment/Plan Fournier's gangrene 2017 PAD CAD s/p stent 2017 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 Rosendo Gros - POD 3 - H pylori pending - continue IV protonix  - await return in bowel function, continue  NPO/NGT to LIWS - JP drain out serosanguinous  - Needs to mobilize. PT consult - continue zosyn/diflucan - Plan for UGI Wednesday.  ID - zosyn 6/6>>, diflucan 6/6>> FEN - IVF, NPO/NGT VTE - SCDs Foley - d/c 6/8 Follow up - Dr. Rosendo Gros   Plan: Continue current plan of care. Needs to mobilize more. Plan for UGI tomorrow. Labs in AM.    LOS: 3 days    Wellington Hampshire , [facility] 11/10/2018, 7:43 AM Pager: [phone] Mon-Thurs 7:00 am-4:30 pm Fri 7:00 am -11:30 AM Sat-Sun 7:00 am-11:30 am

## 2018-11-10 NOTE — Progress Notes (Signed)
PROGRESS NOTE    BLAND RUDZINSKI  MMH:[id number] DOB: [birth year] DOA: 11/07/2018 PCP: Gaynelle Arabian, MD    Brief Narrative:  68 year old male who presented with abdominal pain.  He does have significant past medical history of tobacco and alcohol abuse.  He reported abrupt onset of progressive abdominal pain, that prompted him to come to the hospital.  Physical examination blood pressure 124/73, heart rate 61, temperature 97.6, respiratory rate 18, oxygen saturation 98%, he was awake and alert, his lungs are clear to auscultation bilaterally, heart S1-S2 present and rhythmic, his abdomen was tender in the epigastric region, decreased bowel sounds, positive distention, no lower extremity edema. NA 131, potassium 3.0, chloride 115, BUN 11, glucose 81, BUN 17, creatinine 1.0, white count 7.6, hemoglobin 8.1, hematocrit 25.5, platelets 245, SARS COVID-19 negative.  Urine analysis 21-50 white cells, 21-50 red cells.  Alcohol level less than 10.  CT of the abdomen showed extensive free intraperitoneal air and fluid throughout the abdomen and pelvis.  Favored etiology peptic ulcer disease, possibly in the gastric antrum.  Negative for pulmonary embolism.  Right upper lobe scaring.  Chest radiograph negative for infiltrates.  EKG 48 bpm, normal axis, sinus rhythm, QTC 480, low voltage, no ST segment or T wave changes, one nonconducting PAC.  Patient was admitted to the intensive care unit working diagnosis of perforated viscus.  Assessment & Plan:   Principal Problem:   Perforated gastric ulcer s/p omental patch 11/07/2018 Active Problems:   Acute abdomen   Pressure injury of skin   Alcohol abuse   1. Perforated posterior gastric ulcer, sp exploratory laparotomy and Delford Field. Patient reports abdominal pain controlled with analgesics, continue to have NG tube to suction with 450 ml output. Continue supportive IV fluids with D5LR at 75 ml per H, pain control with hydromorphone and IV  pantoprazole 40 mg IV q12. Antibiotic therapy with IV Zosyn and Diflucan. Follow with surgery recommendations, upper GI series in am.   2. AKI. Renal function with serum cr at 1,30 from 1,57, will continue IV fluids with dextrose and 0.45% NS. K is 4,6 and serum bicarbonate at 18 with non gap metabolic acidosis. Na 134 and Cl al 110.  2. Etho and tobacco abuse. No signs of active withdrawal, will continue neuro checks per unit protocol, will change CIWA to only as needed lorazepam. Continue thiamine.   3. T2DM. Fasting glucose is 137, patient on IV dextrose, will discontinue insulin for now. Continue glucose monitoring. Patient at home on prednisone, will hold for now on stress dose steroids unless hemodynamic decompensation.   4. HTN. Patient at home on metoprolol 25 mg per day, blood pressure 139 to 149 mmHg, will continue close monitoring, patient is NPO. Keep systolic less than 159 mmHg.   5. Chronic ambulatory dysfunction with sacral stage 2 decubitus ulcer, (present on admission). Continue local wound care, physical therapy and out of bed as tolerated.  6. Multifactorial chronic anemia. Hgb this am is 7,6 with Hct at 24, will continue close monitoring of cell count, consider PRBC transfusion when Hgb less than 7. Check iron panel in am.   DVT prophylaxis: scd  Code Status: full Family Communication: no family at the bedside  Disposition Plan/ discharge barriers: pending clinical improvement.   Body mass index is 21.05 kg/m. Malnutrition Type:  Nutrition Problem: Inadequate oral intake Etiology: inability to eat   Malnutrition Characteristics:  Signs/Symptoms: NPO status   Nutrition Interventions:  Interventions: Refer to RD note for recommendations  RN Pressure Injury Documentation: Pressure Injury 11/08/18 Buttocks Left Stage II -  Partial thickness loss of dermis presenting as a shallow open ulcer with a red, pink wound bed without slough. (Active)  11/08/18 1243   Location: Buttocks  Location Orientation: Left  Staging: Stage II -  Partial thickness loss of dermis presenting as a shallow open ulcer with a red, pink wound bed without slough.  Wound Description (Comments):   Present on Admission: Yes     Consultants:   Surgery   Procedures:  sp exploratory laparotomy and Delford Field.   Antimicrobials:       Subjective: Patient is feeling better but not yet back to baseline, continue to have NG tube in place, positive abdominal pain, moderate in intensity, sharp in nature, worse with movement and improved with analgesics.   Objective: Vitals:   11/10/18 0400 11/10/18 0500 11/10/18 0600 11/10/18 0800  BP: (!) 141/73  (!) 149/89   Pulse: 94  88   Resp: 13  15   Temp: 98.2 F (36.8 C)   97.7 F (36.5 C)  TempSrc: Oral   Oral  SpO2: 100%  100%   Weight:  62.8 kg    Height:        Intake/Output Summary (Last 24 hours) at 11/10/2018 0822 Last data filed at 11/10/2018 6314 Gross per 24 hour  Intake 4101.57 ml  Output 865 ml  Net 3236.57 ml   Filed Weights   11/08/18 0500 11/09/18 0500 11/10/18 0500  Weight: 59 kg 59.8 kg 62.8 kg    Examination:   General: deconditioned  Neurology: Awake and alert, non focal  E ENT: mild pallor, no icterus, oral mucosa dry, NG tube in place.  Cardiovascular: No JVD. S1-S2 present, rhythmic, no gallops, rubs, or murmurs. No lower extremity edema. Pulmonary: positive breath sounds bilaterally, adequate air movement, no wheezing, rhonchi or rales. Gastrointestinal. Abdomen mild distended, tender to palpation, surgical wound in place, with no organomegaly. Skin. No rashes Musculoskeletal: no joint deformities     Data Reviewed: I have personally reviewed following labs and imaging studies  CBC: Recent Labs  Lab 11/07/18 1131 11/08/18 0205 11/09/18 0310  WBC 7.6 11.1* 9.7  HGB 8.1* 8.4* 7.6*  HCT 25.5* 27.3* 24.0*  MCV 94.1 98.6 96.4  PLT 245 199 970   Basic Metabolic Panel:  Recent Labs  Lab 11/07/18 1131 11/07/18 1231 11/08/18 0205 11/09/18 0310  NA 131*  --  130* 134*  K 3.0*  --  4.5 4.6  CL 115*  --  106 110  CO2 11*  --  15* 18*  GLUCOSE 61*  --  115* 137*  BUN 17  --  22 18  CREATININE 1.03  --  1.57* 1.30*  CALCIUM 4.2*  --  6.9* 7.4*  MG  --  1.0* 2.0  --   PHOS  --   --  2.4*  --    GFR: Estimated Creatinine Clearance: 49.6 mL/min (A) (by C-G formula based on SCr of 1.3 mg/dL (H)). Liver Function Tests: Recent Labs  Lab 11/07/18 1131 11/08/18 0205  AST 18 32  ALT 10 17  ALKPHOS 56 64  BILITOT 0.3 0.3  PROT <3.0* 4.9*  ALBUMIN 1.1* 2.5*   Recent Labs  Lab 11/07/18 1131  LIPASE 17   No results for input(s): AMMONIA in the last 168 hours. Coagulation Profile: Recent Labs  Lab 11/07/18 1457  INR 1.3*   Cardiac Enzymes: Recent Labs  Lab 11/07/18 1135  TROPONINI <  0.03   BNP (last 3 results) No results for input(s): PROBNP in the last 8760 hours. HbA1C: No results for input(s): HGBA1C in the last 72 hours. CBG: Recent Labs  Lab 11/09/18 1641 11/09/18 1959 11/09/18 2340 11/10/18 0341 11/10/18 0731  GLUCAP 81 100* 108* 107* 133*   Lipid Profile: No results for input(s): CHOL, HDL, LDLCALC, TRIG, CHOLHDL, LDLDIRECT in the last 72 hours. Thyroid Function Tests: No results for input(s): TSH, T4TOTAL, FREET4, T3FREE, THYROIDAB in the last 72 hours. Anemia Panel: No results for input(s): VITAMINB12, FOLATE, FERRITIN, TIBC, IRON, RETICCTPCT in the last 72 hours.    Radiology Studies: I have reviewed all of the imaging during this hospital visit personally     Scheduled Meds: . Chlorhexidine Gluconate Cloth  6 each Topical Daily  . fentaNYL (SUBLIMAZE) injection  50 mcg Intravenous Once  . folic acid  1 mg Intravenous Daily  . insulin aspart  2-6 Units Subcutaneous Q4H  . latanoprost  1 drop Both Eyes QHS  . mupirocin ointment  1 application Nasal BID  . pantoprazole (PROTONIX) IV  40 mg Intravenous Q12H  .  thiamine injection  100 mg Intravenous Daily   Continuous Infusions: . dextrose 5% lactated ringers with KCl 20 mEq/L 75 mL/hr at 11/09/18 1929  . fluconazole (DIFLUCAN) IV Stopped (11/10/18 0011)  . methocarbamol (ROBAXIN) IV    . piperacillin-tazobactam (ZOSYN)  IV 3.375 g (11/10/18 0519)     LOS: 3 days        Mauricio Gerome Apley, MD

## 2018-11-11 ENCOUNTER — Inpatient Hospital Stay (HOSPITAL_COMMUNITY): Payer: Medicare Other

## 2018-11-11 LAB — PREPARE RBC (CROSSMATCH)

## 2018-11-11 LAB — CBC WITH DIFFERENTIAL/PLATELET
Abs Immature Granulocytes: 0.14 10*3/uL — ABNORMAL HIGH (ref 0.00–0.07)
Basophils Absolute: 0.1 10*3/uL (ref 0.0–0.1)
Basophils Relative: 1 %
Eosinophils Absolute: 0.2 10*3/uL (ref 0.0–0.5)
Eosinophils Relative: 3 %
HCT: 22.1 % — ABNORMAL LOW (ref 39.0–52.0)
Hemoglobin: 7 g/dL — ABNORMAL LOW (ref 13.0–17.0)
Immature Granulocytes: 2 %
Lymphocytes Relative: 8 %
Lymphs Abs: 0.6 10*3/uL — ABNORMAL LOW (ref 0.7–4.0)
MCH: 30.6 pg (ref 26.0–34.0)
MCHC: 31.7 g/dL (ref 30.0–36.0)
MCV: 96.5 fL (ref 80.0–100.0)
Monocytes Absolute: 0.5 10*3/uL (ref 0.1–1.0)
Monocytes Relative: 7 %
Neutro Abs: 5.5 10*3/uL (ref 1.7–7.7)
Neutrophils Relative %: 79 %
Platelets: 207 10*3/uL (ref 150–400)
RBC: 2.29 MIL/uL — ABNORMAL LOW (ref 4.22–5.81)
RDW: 14.8 % (ref 11.5–15.5)
WBC: 7.1 10*3/uL (ref 4.0–10.5)
nRBC: 0 % (ref 0.0–0.2)

## 2018-11-11 LAB — GLUCOSE, CAPILLARY
Glucose-Capillary: 112 mg/dL — ABNORMAL HIGH (ref 70–99)
Glucose-Capillary: 119 mg/dL — ABNORMAL HIGH (ref 70–99)
Glucose-Capillary: 121 mg/dL — ABNORMAL HIGH (ref 70–99)
Glucose-Capillary: 108 mg/dL — ABNORMAL HIGH (ref 70–99)
Glucose-Capillary: 93 mg/dL (ref 70–99)

## 2018-11-11 LAB — BASIC METABOLIC PANEL
Anion gap: 7 (ref 5–15)
BUN: 13 mg/dL (ref 8–23)
CO2: 18 mmol/L — ABNORMAL LOW (ref 22–32)
Calcium: 7.6 mg/dL — ABNORMAL LOW (ref 8.9–10.3)
Chloride: 111 mmol/L (ref 98–111)
Creatinine, Ser: 1.33 mg/dL — ABNORMAL HIGH (ref 0.61–1.24)
GFR calc Af Amer: >60 mL/min (ref >60)
GFR calc non Af Amer: 55 mL/min — ABNORMAL LOW (ref >60)
Glucose, Bld: 128 mg/dL — ABNORMAL HIGH (ref 70–99)
Potassium: 4.1 mmol/L (ref 3.5–5.1)
Sodium: 136 mmol/L (ref 135–145)

## 2018-11-11 LAB — IRON AND TIBC
Iron: 15 ug/dL — ABNORMAL LOW (ref 45–182)
Saturation Ratios: 13 % — ABNORMAL LOW (ref 17.9–39.5)
TIBC: 119 ug/dL — ABNORMAL LOW (ref 250–450)
UIBC: 104 ug/dL

## 2018-11-11 LAB — TRANSFERRIN: Transferrin: 85 mg/dL — ABNORMAL LOW (ref 180–329)

## 2018-11-11 LAB — MAGNESIUM: Magnesium: 1.5 mg/dL — ABNORMAL LOW (ref 1.7–2.4)

## 2018-11-11 LAB — FERRITIN: Ferritin: 281 ng/mL (ref 24–336)

## 2018-11-11 MED ORDER — IOHEXOL 300 MG/ML  SOLN
150.0000 mL | Freq: Once | INTRAMUSCULAR | Status: AC | PRN
  Administered 2018-11-11: 100 mL via INTRAVENOUS

## 2018-11-11 MED ORDER — SODIUM CHLORIDE 0.9% IV SOLUTION
Freq: Once | INTRAVENOUS | Status: AC
  Administered 2018-11-11: 15:00:00 via INTRAVENOUS

## 2018-11-11 NOTE — Progress Notes (Signed)
PROGRESS NOTE    JIYAAN STEINHAUSER  ULA:[id number] DOB: [birth year] DOA: 11/07/2018 PCP: Gaynelle Arabian, MD   Brief Narrative:   68 year old male who presented with abdominal pain.  He does have significant past medical history of tobacco and alcohol abuse.  He reported abrupt onset of progressive abdominal pain, that prompted him to come to the hospital.  Physical examination blood pressure 124/73, heart rate 61, temperature 97.6, respiratory rate 18, oxygen saturation 98%, he was awake and alert, his lungs are clear to auscultation bilaterally, heart S1-S2 present and rhythmic, his abdomen was tender in the epigastric region, decreased bowel sounds, positive distention, no lower extremity edema. NA 131, potassium 3.0, chloride 115, BUN 11, glucose 81, BUN 17, creatinine 1.0, white count 7.6, hemoglobin 8.1, hematocrit 25.5, platelets 245, SARS COVID-19 negative.  Urine analysis 21-50 white cells, 21-50 red cells.  Alcohol level less than 10.  CT of the abdomen showed extensive free intraperitoneal air and fluid throughout the abdomen and pelvis.  Favored etiology peptic ulcer disease, possibly in the gastric antrum.  Negative for pulmonary embolism.  Right upper lobe scaring.  Chest radiograph negative for infiltrates.  EKG 48 bpm, normal axis, sinus rhythm, QTC 480, low voltage, no ST segment or T wave changes, one nonconducting PAC.  Patient was admitted to the intensive care unit working diagnosis of perforated viscus.   Assessment & Plan:   Principal Problem:   Perforated gastric ulcer s/p omental patch 11/07/2018 Active Problems:   Acute abdomen   Pressure injury of skin   Alcohol abuse   Perforated posterior gastric ulcer, s/p exploratory laparotomy and [place]. Patient presented to [facility] long ED with progressive abdominal discomfort.  CT abdomen/pelvis notable for extensive free intraperitoneal air and fluid.  Patient underwent exploratory laparotomy with Phillip Heal patch repair  11/07/2018 by Dr. Rosendo Gros.  H. pylori antibody was negative. --General surgery following, appreciate assistance --UGI series today with no evidence of gastric leak and questionable small duodenal diverticulum. --Continues NPO with NGT to intermittent low wall suction; 978mL out past 24 hours --JP drain in place, 175 mL's out past 24 hours --Continue Protonix 40 mg IV twice daily --D5 1/2NS at 70mL per hour --Continue empiric antimicrobial coverage with Zosyn and fluconazole --Further NG tube/diet advancement per general surgery  AKI Creatinine on admission 1.03, peaked at 1.57.  Now trending down.  --Continue IV fluid hydration as above while n.p.o. --Renally dose all medications, avoid nephrotoxins --Daily BMP  EtOH and tobacco abuse ICU/hospital-acquired delirium EtOH level on admission less than 10.  Currently alert and oriented. --continue to monitor on CIWAA --Continue folic acid and thiamine --Encourage proper sleep hygiene  T2DM Hemoglobin A1c 5.8 on 09/09/2015.  Diet controlled and not on any home medications. --Update hemoglobin A1c in the a.m. --Continue to monitor CBG's   HTN Patient on metoprolol 25 mg per day at home --SBP 114 to 165 mmHg, --continue close monitoring, patient is NPO, resume home metoprolol once can tolerate diet  Chronic ambulatory dysfunction with sacral stage 2 decubitus ulcer, present on admission --Continue local wound care, physical therapy and out of bed as tolerated. --Will need home health PT on discharge  Multifactorial chronic anemia Hemoglobin dropped from 7.6-7.0 today.  Iron studies remarkable for iron 15, TIBC 119, ferritin 281, transferrin 85, consistent with anemia of chronic disease; likely exacerbated by some postop anemia.  No signs of active bleeding. --Transfuse 1 unit PRBCs today --Repeat CBC in the a.m.    DVT prophylaxis: SCDs Code  Status: Full code Family Communication: none Disposition Plan: Continue inpatient,  further dependent on clinical course, anticipate discharge home with home health services when medically ready   Consultants:   General surgery  Procedures:   S/pEmergencyLAPAROSCOPY DIAGNOSTIC (N/A),EXPLORATORY LAPAROTOMYand Graham patch repair of perforated posterior gastric ulcer6/6 Dr. Rosendo Gros  Antimicrobials:   Zosyn 6/6>>  Fluconazole 6/6>>   Subjective: Patient seen and examined at bedside, nursing present.  Slightly anxious this morning.  General surgery planning on upper GI series to evaluate repair of his gastric ulcer.  Continues to be n.p.o. with NG tube in place.  He is alert and oriented.  Abdominal pain is markedly improved.  Reports bowel movement early this morning.  Wants to know when he can have his NG tube removed.  No other complaints at this time.  Denies headache, no fever/chills/night sweats, no chest pain, no palpitations, no shortness of breath.  No acute concerns overnight per nursing staff.  Will transfer from stepdown to telemetry today.  Objective: Vitals:   11/11/18 0532 11/11/18 0600 11/11/18 0800 11/11/18 0808  BP:  (!) 114/48  118/84  Pulse: 77 79  95  Resp: 10 17 18 18   Temp:   (!) 97.3 F (36.3 C)   TempSrc:   Oral   SpO2: 100% 100%  100%  Weight:      Height:        Intake/Output Summary (Last 24 hours) at 11/11/2018 1146 Last data filed at 11/11/2018 0900 Gross per 24 hour  Intake 1934.74 ml  Output 1325 ml  Net 609.74 ml   Filed Weights   11/09/18 0500 11/10/18 0500 11/11/18 0447  Weight: 59.8 kg 62.8 kg 60.4 kg    Examination:  General exam: Slightly anxious, no acute distress Respiratory system: Clear to auscultation. Respiratory effort normal. Cardiovascular system: S1 & S2 heard, RRR. No JVD, murmurs, rubs, gallops or clicks. No pedal edema. Gastrointestinal system: Abdomen is slightly distended, soft and nontender. No organomegaly or masses felt.  Surgical wound noted, C/D/I, faint/hypoactive bowel sounds. Central  nervous system: Alert and oriented. No focal neurological deficits. Extremities: Symmetric 5 x 5 power. Skin: No rashes, lesions or ulcers Psychiatry: Judgement and insight appear normal.  Slightly anxious.    Data Reviewed: I have personally reviewed following labs and imaging studies  CBC: Recent Labs  Lab 11/07/18 1131 11/08/18 0205 11/09/18 0310 11/11/18 0440  WBC 7.6 11.1* 9.7 7.1  NEUTROABS  --   --   --  5.5  HGB 8.1* 8.4* 7.6* 7.0*  HCT 25.5* 27.3* 24.0* 22.1*  MCV 94.1 98.6 96.4 96.5  PLT 245 199 175 160   Basic Metabolic Panel: Recent Labs  Lab 11/07/18 1131 11/07/18 1231 11/08/18 0205 11/09/18 0310 11/11/18 0440  NA 131*  --  130* 134* 136  K 3.0*  --  4.5 4.6 4.1  CL 115*  --  106 110 111  CO2 11*  --  15* 18* 18*  GLUCOSE 61*  --  115* 137* 128*  BUN 17  --  22 18 13   CREATININE 1.03  --  1.57* 1.30* 1.33*  CALCIUM 4.2*  --  6.9* 7.4* 7.6*  MG  --  1.0* 2.0  --  1.5*  PHOS  --   --  2.4*  --   --    GFR: Estimated Creatinine Clearance: 46.7 mL/min (A) (by C-G formula based on SCr of 1.33 mg/dL (H)). Liver Function Tests: Recent Labs  Lab 11/07/18 1131 11/08/18 0205  AST 18  32  ALT 10 17  ALKPHOS 56 64  BILITOT 0.3 0.3  PROT <3.0* 4.9*  ALBUMIN 1.1* 2.5*   Recent Labs  Lab 11/07/18 1131  LIPASE 17   No results for input(s): AMMONIA in the last 168 hours. Coagulation Profile: Recent Labs  Lab 11/07/18 1457  INR 1.3*   Cardiac Enzymes: Recent Labs  Lab 11/07/18 1135  TROPONINI <0.03   BNP (last 3 results) No results for input(s): PROBNP in the last 8760 hours. HbA1C: No results for input(s): HGBA1C in the last 72 hours. CBG: Recent Labs  Lab 11/10/18 1937 11/10/18 2318 11/11/18 0308 11/11/18 0744 11/11/18 0746  GLUCAP 106* 109* 112* 121* 119*   Lipid Profile: No results for input(s): CHOL, HDL, LDLCALC, TRIG, CHOLHDL, LDLDIRECT in the last 72 hours. Thyroid Function Tests: No results for input(s): TSH, T4TOTAL,  FREET4, T3FREE, THYROIDAB in the last 72 hours. Anemia Panel: Recent Labs    11/11/18 0239  FERRITIN 281  TIBC 119*  IRON 15*   Sepsis Labs: Recent Labs  Lab 11/07/18 1131  LATICACIDVEN 1.5    Recent Results (from the past 240 hour(s))  SARS Coronavirus 2 (CEPHEID- Performed in [place] hospital lab), Hosp Order     Status: None   Collection Time: 11/07/18 11:18 AM  Result Value Ref Range Status   SARS Coronavirus 2 NEGATIVE NEGATIVE Final    Comment: (NOTE) If result is NEGATIVE SARS-CoV-2 target nucleic acids are NOT DETECTED. The SARS-CoV-2 RNA is generally detectable in upper and lower  respiratory specimens during the acute phase of infection. The lowest  concentration of SARS-CoV-2 viral copies this assay can detect is 250  copies / mL. A negative result does not preclude SARS-CoV-2 infection  and should not be used as the sole basis for treatment or other  patient management decisions.  A negative result may occur with  improper specimen collection / handling, submission of specimen other  than nasopharyngeal swab, presence of viral mutation(s) within the  areas targeted by this assay, and inadequate number of viral copies  (<250 copies / mL). A negative result must be combined with clinical  observations, patient history, and epidemiological information. If result is POSITIVE SARS-CoV-2 target nucleic acids are DETECTED. The SARS-CoV-2 RNA is generally detectable in upper and lower  respiratory specimens dur ing the acute phase of infection.  Positive  results are indicative of active infection with SARS-CoV-2.  Clinical  correlation with patient history and other diagnostic information is  necessary to determine patient infection status.  Positive results do  not rule out bacterial infection or co-infection with other viruses. If result is PRESUMPTIVE POSTIVE SARS-CoV-2 nucleic acids MAY BE PRESENT.   A presumptive positive result was obtained on the  submitted specimen  and confirmed on repeat testing.  While 2019 novel coronavirus  (SARS-CoV-2) nucleic acids may be present in the submitted sample  additional confirmatory testing may be necessary for epidemiological  and / or clinical management purposes  to differentiate between  SARS-CoV-2 and other Sarbecovirus currently known to infect humans.  If clinically indicated additional testing with an alternate test  methodology [phone]) is advised. The SARS-CoV-2 RNA is generally  detectable in upper and lower respiratory sp ecimens during the acute  phase of infection. The expected result is Negative. Fact Sheet for Patients:  StrictlyIdeas.no Fact Sheet for Healthcare Providers: BankingDealers.co.za This test is not yet approved or cleared by the [place] FDA and has been authorized for detection and/or diagnosis of SARS-CoV-2  by FDA under an Emergency Use Authorization (EUA).  This EUA will remain in effect (meaning this test can be used) for the duration of the COVID-19 declaration under Section 564(b)(1) of the Act, 21 U.S.C. section 360bbb-3(b)(1), unless the authorization is terminated or revoked sooner. Performed at [facility], [place] [address] [place], [place] [address]   Blood culture (routine x 2)     Status: Abnormal   Collection Time: 11/07/18 11:19 AM  Result Value Ref Range Status   Specimen Description   Final    BLOOD LEFT ANTECUBITAL Performed at [facility], [place] [address], [place], [place] [address]    Special Requests   Final    BOTTLES DRAWN AEROBIC AND ANAEROBIC Blood Culture results may not be optimal due to an inadequate volume of blood received in culture bottles Performed at [place] [address] [place], [place] [address]    Culture  Setup Time   Final    GRAM POSITIVE COCCI AEROBIC BOTTLE ONLY CRITICAL RESULT CALLED TO, READ BACK BY AND  VERIFIED WITH: SWAYNE  PHARMD AT 4818 ON [id number] BY SJW    Culture (A)  Final    STAPHYLOCOCCUS SPECIES (COAGULASE NEGATIVE) THE SIGNIFICANCE OF ISOLATING THIS ORGANISM FROM A SINGLE VENIPUNCTURE CANNOT BE PREDICTED WITHOUT FURTHER CLINICAL AND CULTURE CORRELATION. SUSCEPTIBILITIES AVAILABLE ONLY ON REQUEST. Performed at [facility], [place] [address] [place], [place] [address]    Report Status 11/09/2018 FINAL  Final  Blood Culture ID Panel (Reflexed)     Status: Abnormal   Collection Time: 11/07/18 11:19 AM  Result Value Ref Range Status   Enterococcus species NOT DETECTED NOT DETECTED Final   Listeria monocytogenes NOT DETECTED NOT DETECTED Final   Staphylococcus species DETECTED (A) NOT DETECTED Final    Comment: Methicillin (oxacillin) susceptible coagulase negative staphylococcus. Possible blood culture contaminant (unless isolated from more than one blood culture draw or clinical case suggests pathogenicity). No antibiotic treatment is indicated for blood  culture contaminants. CRITICAL RESULT CALLED TO, READ BACK BY AND VERIFIED WITH: SWAYNE PHARMD AT 1114 ON [id number] BY SJW    Staphylococcus aureus (BCID) NOT DETECTED NOT DETECTED Final   Methicillin resistance NOT DETECTED NOT DETECTED Final   Streptococcus species NOT DETECTED NOT DETECTED Final   Streptococcus agalactiae NOT DETECTED NOT DETECTED Final   Streptococcus pneumoniae NOT DETECTED NOT DETECTED Final   Streptococcus pyogenes NOT DETECTED NOT DETECTED Final   Acinetobacter baumannii NOT DETECTED NOT DETECTED Final   Enterobacteriaceae species NOT DETECTED NOT DETECTED Final   Enterobacter cloacae complex NOT DETECTED NOT DETECTED Final   Escherichia coli NOT DETECTED NOT DETECTED Final   Klebsiella oxytoca NOT DETECTED NOT DETECTED Final   Klebsiella pneumoniae NOT DETECTED NOT DETECTED Final   Proteus species NOT DETECTED NOT DETECTED Final   Serratia marcescens NOT DETECTED NOT DETECTED Final   Haemophilus  influenzae NOT DETECTED NOT DETECTED Final   Neisseria meningitidis NOT DETECTED NOT DETECTED Final   Pseudomonas aeruginosa NOT DETECTED NOT DETECTED Final   Candida albicans NOT DETECTED NOT DETECTED Final   Candida glabrata NOT DETECTED NOT DETECTED Final   Candida krusei NOT DETECTED NOT DETECTED Final   Candida parapsilosis NOT DETECTED NOT DETECTED Final   Candida tropicalis NOT DETECTED NOT DETECTED Final    Comment: Performed at [facility], [address] [place], [place] [address]  MRSA PCR Screening     Status: Abnormal   Collection Time: 11/07/18 11:46  PM  Result Value Ref Range Status   MRSA by PCR POSITIVE (A) NEGATIVE Final    Comment:        The GeneXpert MRSA Assay (FDA approved for NASAL specimens only), is one component of a comprehensive MRSA colonization surveillance program. It is not intended to diagnose MRSA infection nor to guide or monitor treatment for MRSA infections. RESULT CALLED TO, READ BACK BY AND VERIFIED WITH: LANEY,S RN @0233  ON 11/08/2018 JACKSON,K Performed at [facility], [place] [address], [place], [place] [address]          Radiology Studies: Dg Paulene Floor Single Cm (sol Or Thin Ba)  Result Date: 11/11/2018 CLINICAL DATA:  Perforated gastric ulcer.  Surgical repair EXAM: WATER SOLUBLE UPPER GI SERIES TECHNIQUE: Single-column upper GI series was performed using water soluble contrast. CONTRAST:  156mL OMNIPAQUE IOHEXOL 300 MG/ML  SOLN COMPARISON:  CT 11/07/2018 FLUOROSCOPY TIME:  Fluoroscopy Time:  1.6 minutes Radiation Exposure Index (if provided by the fluoroscopic device): 14.9 mGy Number of Acquired Spot Images: 14 FINDINGS: NG tube in stomach.  Surgical drain noted. Water-soluble contrast was introduced through the nasogastric tube. The tip of the tube is in the gastric antrum. Contrast filled the body the stomach and quickly flowed into the duodenum as the tube is distally placed. Patient was positioned LEFT side down  to fill the gastric fundus. No evidence extraluminal contrast leaking from the stomach or duodenum. Potential small duodenum diverticulum adjacent to the duodenal bulb. Tube was flushed with water following procedure. IMPRESSION: No evidence of gastric leak following surgical repair of perforated gastric ulcer. 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 These results will be called to the ordering clinician or representative by the Radiologist Assistant, and communication documented in the PACS or zVision Dashboard. Electronically Signed   By: Suzy Bouchard M.D.   On: 11/11/2018 10:37        Scheduled Meds:  sodium chloride   Intravenous Once   Chlorhexidine Gluconate Cloth  6 each Topical Daily   folic acid  1 mg Intravenous Daily   latanoprost  1 drop Both Eyes QHS   mupirocin ointment  1 application Nasal BID   pantoprazole (PROTONIX) IV  40 mg Intravenous Q12H   sodium chloride flush  10-40 mL Intracatheter Q12H   thiamine injection  100 mg Intravenous Daily   Continuous Infusions:  sodium chloride 250 mL (11/10/18 1333)   dextrose 5 % and 0.45% NaCl 75 mL/hr at 11/11/18 0900   fluconazole (DIFLUCAN) IV Stopped (11/10/18 2226)   methocarbamol (ROBAXIN) IV     piperacillin-tazobactam (ZOSYN)  IV 12.5 mL/hr at 11/11/18 0900     LOS: 4 days    Time spent: 36 minutes    Herron Fero J [place] DO [facility] Pager [phone]  If 7PM-7AM, please contact night-coverage [email or web address] Password [facility] 11/11/2018, 11:46 AM

## 2018-11-11 NOTE — Progress Notes (Signed)
Physical Therapy Treatment Patient Details Name: Rodney [name] MRN: [id number] DOB: [birth year] Today's Date: 11/11/2018    History of Present Illness 68 yo male admitted with perforated gastric ulcer s/p exp lap, omental patch 11/07/18, ETOH WD. Hx of PVD, CAD, prostate Ca, ETOH abuse    PT Comments    Pt progressing, cooperative today with OOB. Limited by fatigue, mild dizziness. VSS with  HR max 124. RN present and assisting with transfer. Will continue to follow.  Follow Up Recommendations  Home health PT;Supervision/Assistance - 24 hour(?vs SNF)     Equipment Recommendations  None recommended by PT    Recommendations for Other Services       Precautions / Restrictions Precautions Precautions: Fall Precaution Comments: L jp drain Restrictions Weight Bearing Restrictions: No    Mobility  Bed Mobility Overal bed mobility: Needs Assistance Bed Mobility: Supine to Sit     Supine to sit: Min assist     General bed mobility comments: assist with trunk to upright, bed flat  Transfers Overall transfer level: Needs assistance Equipment used: 2 person hand held assist Transfers: Sit to/from Stand;Stand Pivot Transfers Sit to Stand: Min assist;+2 safety/equipment Stand pivot transfers: +2 safety/equipment;+2 physical assistance;Min assist       General transfer comment: assist to rise and stabilize, cues for sequencing and safety  Ambulation/Gait             General Gait Details: pivotal steps to chair; mild dizziness and fatigue limiting amb distance    Stairs             Wheelchair Mobility    Modified Rankin (Stroke Patients Only)       Balance                                            Cognition Arousal/Alertness: Awake/alert Behavior During Therapy: WFL for tasks assessed/performed Overall Cognitive Status: Within Functional Limits for tasks assessed                                 General Comments:  occaisonal redirection to task      Exercises      General Comments        Pertinent Vitals/Pain Pain Assessment: No/denies pain    Home Living                      Prior Function            PT Goals (current goals can now be found in the care plan section) Acute Rehab PT Goals PT Goal Formulation: With patient Time For Goal Achievement: 11/24/18 Potential to Achieve Goals: Good Progress towards PT goals: Progressing toward goals    Frequency    Min 3X/week      PT Plan Current plan remains appropriate    Co-evaluation              AM-PAC PT "6 Clicks" Mobility   Outcome Measure  Help needed turning from your back to your side while in a flat bed without using bedrails?: A Little Help needed moving from lying on your back to sitting on the side of a flat bed without using bedrails?: A Little Help needed moving to and from a bed to a chair (including a wheelchair)?: A Little Help needed  standing up from a chair using your arms (e.g., wheelchair or bedside chair)?: A Lot Help needed to walk in hospital room?: A Lot Help needed climbing 3-5 steps with a railing? : A Lot 6 Click Score: 15    End of Session   Activity Tolerance: Patient tolerated treatment well;Patient limited by fatigue Patient left: in chair;with call bell/phone within reach;with chair alarm set;with nursing/sitter in room   PT Visit Diagnosis: Muscle weakness (generalized) (M62.81);Difficulty in walking, not elsewhere classified (R26.2)     Time: 2119-4174 PT Time Calculation (min) (ACUTE ONLY): 14 min  Charges:  $Therapeutic Activity: 8-22 mins                     Kenyon Ana, PT  Pager: [phone] Acute Rehab Dept [facility]): [phone]   11/11/2018    [facility] 11/11/2018, 11:29 AM

## 2018-11-11 NOTE — Progress Notes (Signed)
4 Days Post-Op  Subjective: Awake.  Stable.  Afebrile.  Heart rate 79.  SPO2 100%  less agitated but still refusing to get out of bed.  I talked with him about this for a while this morning.  He says he will try to do better. Pain seems controlled. NG output bilious.  900 cc. JP drain serosanguineous, 175 cc Nursing staff reports 1 stool  Potassium 4.1.  Creatinine 1.33.  Hemoglobin 7.0.  WBC 7100   Objective: Vital signs in last 24 hours: Temp:  [97.3 F (36.3 C)-98.8 F (37.1 C)] 97.3 F (36.3 C) (06/10 0800) Pulse Rate:  [75-105] 79 (06/10 0600) Resp:  [10-19] 17 (06/10 0600) BP: (114-165)/(48-107) 114/48 (06/10 0600) SpO2:  [100 %] 100 % (06/10 0600) Weight:  [60.4 kg] 60.4 kg (06/10 0447) Last BM Date: (PTA)  Intake/Output from previous day: 06/09 0701 - 06/10 0700 In: 1750.2 [P.O.:180; I.V.:1431.7; IV Piggyback:138.6] Out: 1375 [Urine:300; Emesis/NG output:900; Drains:175] Intake/Output this shift: No intake/output data recorded.   PE: Gen: Alert, NAD, mild agitation.  Oriented to name and place.  A little confused about reason for hospitalization HEENT: EOM's intact Card: RRR Pulm: CTAB, no W/R/R, effort normal Abd: Soft,mild distension, appropriately tender,fewBSheard,midline cdi, JP drain output serosanguinous, NG output bilious Skin: warm and dry    Lab Results:  Results for orders placed or performed during the hospital encounter of 11/07/18 (from the past 24 hour(s))  Glucose, capillary     Status: None   Collection Time: 11/10/18 11:50 AM  Result Value Ref Range   Glucose-Capillary 75 70 - 99 mg/dL  Glucose, capillary     Status: Abnormal   Collection Time: 11/10/18  3:22 PM  Result Value Ref Range   Glucose-Capillary 103 (H) 70 - 99 mg/dL   Comment 1 Notify RN    Comment 2 Document in Chart   Glucose, capillary     Status: Abnormal   Collection Time: 11/10/18  7:37 PM  Result Value Ref Range   Glucose-Capillary 106 (H) 70 - 99 mg/dL   Glucose, capillary     Status: Abnormal   Collection Time: 11/10/18 11:18 PM  Result Value Ref Range   Glucose-Capillary 109 (H) 70 - 99 mg/dL  Ferritin     Status: None   Collection Time: 11/11/18  2:39 AM  Result Value Ref Range   Ferritin 281 24 - 336 ng/mL  Iron and TIBC     Status: Abnormal   Collection Time: 11/11/18  2:39 AM  Result Value Ref Range   Iron 15 (L) 45 - 182 ug/dL   TIBC 119 (L) 250 - 450 ug/dL   Saturation Ratios 13 (L) 17.9 - 39.5 %   UIBC 104 ug/dL  Transferrin     Status: Abnormal   Collection Time: 11/11/18  2:39 AM  Result Value Ref Range   Transferrin 85 (L) 180 - 329 mg/dL  Glucose, capillary     Status: Abnormal   Collection Time: 11/11/18  3:08 AM  Result Value Ref Range   Glucose-Capillary 112 (H) 70 - 99 mg/dL  Basic metabolic panel     Status: Abnormal   Collection Time: 11/11/18  4:40 AM  Result Value Ref Range   Sodium 136 135 - 145 mmol/L   Potassium 4.1 3.5 - 5.1 mmol/L   Chloride 111 98 - 111 mmol/L   CO2 18 (L) 22 - 32 mmol/L   Glucose, Bld 128 (H) 70 - 99 mg/dL   BUN 13 8 - 23  mg/dL   Creatinine, Ser 1.33 (H) 0.61 - 1.24 mg/dL   Calcium 7.6 (L) 8.9 - 10.3 mg/dL   GFR calc non Af Amer 55 (L) >60 mL/min   GFR calc Af Amer >60 >60 mL/min   Anion gap 7 5 - 15  Magnesium     Status: Abnormal   Collection Time: 11/11/18  4:40 AM  Result Value Ref Range   Magnesium 1.5 (L) 1.7 - 2.4 mg/dL  CBC with Differential/Platelet     Status: Abnormal   Collection Time: 11/11/18  4:40 AM  Result Value Ref Range   WBC 7.1 4.0 - 10.5 K/uL   RBC 2.29 (L) 4.22 - 5.81 MIL/uL   Hemoglobin 7.0 (L) 13.0 - 17.0 g/dL   HCT 22.1 (L) 39.0 - 52.0 %   MCV 96.5 80.0 - 100.0 fL   MCH 30.6 26.0 - 34.0 pg   MCHC 31.7 30.0 - 36.0 g/dL   RDW 14.8 11.5 - 15.5 %   Platelets 207 150 - 400 K/uL   nRBC 0.0 0.0 - 0.2 %   Neutrophils Relative % 79 %   Neutro Abs 5.5 1.7 - 7.7 K/uL   Lymphocytes Relative 8 %   Lymphs Abs 0.6 (L) 0.7 - 4.0 K/uL   Monocytes  Relative 7 %   Monocytes Absolute 0.5 0.1 - 1.0 K/uL   Eosinophils Relative 3 %   Eosinophils Absolute 0.2 0.0 - 0.5 K/uL   Basophils Relative 1 %   Basophils Absolute 0.1 0.0 - 0.1 K/uL   Immature Granulocytes 2 %   Abs Immature Granulocytes 0.14 (H) 0.00 - 0.07 K/uL  Glucose, capillary     Status: Abnormal   Collection Time: 11/11/18  7:44 AM  Result Value Ref Range   Glucose-Capillary 121 (H) 70 - 99 mg/dL  Glucose, capillary     Status: Abnormal   Collection Time: 11/11/18  7:46 AM  Result Value Ref Range   Glucose-Capillary 119 (H) 70 - 99 mg/dL   Comment 1 Notify RN    Comment 2 Document in Chart      Studies/Results: No results found.  . sodium chloride   Intravenous Once  . Chlorhexidine Gluconate Cloth  6 each Topical Daily  . fentaNYL (SUBLIMAZE) injection  50 mcg Intravenous Once  . folic acid  1 mg Intravenous Daily  . latanoprost  1 drop Both Eyes QHS  . mupirocin ointment  1 application Nasal BID  . pantoprazole (PROTONIX) IV  40 mg Intravenous Q12H  . sodium chloride flush  10-40 mL Intracatheter Q12H  . thiamine injection  100 mg Intravenous Daily     Assessment/Plan: s/p Procedure(s): LAPAROSCOPY DIAGNOSTIC EXPLORATORY LAPAROTOMY gram patch gastric ulcer  Fournier's gangrene 2017 PAD CAD s/p stent 2017 Bullous pemphigoid on chronic prednisone HTN Prostate cancer s/p prostatectomy and XRT HLD H/o tobacco abuse H/o alcohol abuse - CIWA, has not required any ativan Anemia - Hg 7.6 (6/8), 7.0 (6/10) monitor AKI - Cr trending down 1.33 (6/10), monitor UOP  Perforated posterior gastric ulcer S/pEmergencyLAPAROSCOPY DIAGNOSTIC (N/A),EXPLORATORY LAPAROTOMYand Graham patch repair of perforated posterior gastric ulcer6/6 Dr. Rosendo Gros - POD 4 - H pylori Antibody 0.49, negative - continue IV protonix  - await return in bowel function, continue NPO/NGT to LIWS.  Consider removing NG tube if upper GI okay - JP drain out serosanguinous  - Needs to  mobilize. PT consult - continue zosyn/diflucan - Plan for UGI  Today  ID -zosyn 6/6>>, diflucan 6/6>> FEN -IVF, NPO/NGT VTE -SCDs  Foley -d/c 6/8 Follow up -Dr. Rosendo Gros   Plan: Continue current plan of care. Needs to mobilize more. Plan for UGI today. Labs in AM.  @PROBHOSP @  LOS: 4 days    Adin Hector 11/11/2018  . .prob

## 2018-11-12 LAB — BASIC METABOLIC PANEL
Anion gap: 9 (ref 5–15)
BUN: 14 mg/dL (ref 8–23)
CO2: 18 mmol/L — ABNORMAL LOW (ref 22–32)
Calcium: 8 mg/dL — ABNORMAL LOW (ref 8.9–10.3)
Chloride: 108 mmol/L (ref 98–111)
Creatinine, Ser: 1.29 mg/dL — ABNORMAL HIGH (ref 0.61–1.24)
GFR calc Af Amer: >60 mL/min (ref >60)
GFR calc non Af Amer: 57 mL/min — ABNORMAL LOW (ref >60)
Glucose, Bld: 116 mg/dL — ABNORMAL HIGH (ref 70–99)
Potassium: 3.5 mmol/L (ref 3.5–5.1)
Sodium: 135 mmol/L (ref 135–145)

## 2018-11-12 LAB — CBC
HCT: 26.9 % — ABNORMAL LOW (ref 39.0–52.0)
Hemoglobin: 8.7 g/dL — ABNORMAL LOW (ref 13.0–17.0)
MCH: 30.4 pg (ref 26.0–34.0)
MCHC: 32.3 g/dL (ref 30.0–36.0)
MCV: 94.1 fL (ref 80.0–100.0)
Platelets: 204 10*3/uL (ref 150–400)
RBC: 2.86 MIL/uL — ABNORMAL LOW (ref 4.22–5.81)
RDW: 15.4 % (ref 11.5–15.5)
WBC: 8.3 10*3/uL (ref 4.0–10.5)
nRBC: 0 % (ref 0.0–0.2)

## 2018-11-12 LAB — BPAM RBC
Blood Product Expiration Date: 202006242359
ISSUE DATE / TIME: 202006101217
Unit Type and Rh: 6200

## 2018-11-12 LAB — TYPE AND SCREEN
ABO/RH(D): A POS
Antibody Screen: NEGATIVE
Unit division: 0

## 2018-11-12 LAB — HEMOGLOBIN A1C
Hgb A1c MFr Bld: 5.2 % (ref 4.8–5.6)
Mean Plasma Glucose: 102.54 mg/dL

## 2018-11-12 MED ORDER — BOOST / RESOURCE BREEZE PO LIQD CUSTOM
1.0000 | Freq: Three times a day (TID) | ORAL | Status: DC
  Administered 2018-11-12 – 2018-11-16 (×5): 1 via ORAL

## 2018-11-12 NOTE — Progress Notes (Addendum)
[facility] Progress Note  5 Days Post-Op  Subjective: CC-  Patient is very confused this morning. Not compliant, pulling at lines. Required 3 does ativan over night for agitation. Does seem to have any abdominal pain. No n/v. He has had multiple BMs.  Drain with minimal serosanguinous output. WBC 8.3, afebrile.  Therapies recommending HH with 24 hour supervisions vs SNF.  Objective: Vital signs in last 24 hours: Temp:  [96 F (35.6 C)-98 F (36.7 C)] 98 F (36.7 C) (06/11 0549) Pulse Rate:  [68-106] 77 (06/11 0549) Resp:  [14-19] 18 (06/11 0549) BP: (126-149)/(82-108) 126/107 (06/11 0549) SpO2:  [84 %-98 %] 96 % (06/11 0549) Last BM Date: 11/11/18  Intake/Output from previous day: 06/10 0701 - 06/11 0700 In: 2034.6 [I.V.:1684.8; IV Piggyback:349.7] Out: 7.5 [Drains:7.5] Intake/Output this shift: No intake/output data recorded.  PE: Gen: Alert, NAD, mild agitation. confused HEENT: EOM's intact Card: RRR Pulm: CTAB, no W/R/R, effort normal Abd: Soft,nondistended, nontender, +BSheard,midline cdi without erythema or drainage, staples intact, JP drain output serosanguinous Skin: warm and dry   Lab Results:  Recent Labs    11/11/18 0440 11/12/18 0630  WBC 7.1 8.3  HGB 7.0* 8.7*  HCT 22.1* 26.9*  PLT 207 204   BMET Recent Labs    11/11/18 0440 11/12/18 0630  NA 136 135  K 4.1 3.5  CL 111 108  CO2 18* 18*  GLUCOSE 128* 116*  BUN 13 14  CREATININE 1.33* 1.29*  CALCIUM 7.6* 8.0*   PT/INR No results for input(s): LABPROT, INR in the last 72 hours. CMP     Component Value Date/Time   NA 135 11/12/2018 0630   K 3.5 11/12/2018 0630   CL 108 11/12/2018 0630   CO2 18 (L) 11/12/2018 0630   GLUCOSE 116 (H) 11/12/2018 0630   BUN 14 11/12/2018 0630   CREATININE 1.29 (H) 11/12/2018 0630   CALCIUM 8.0 (L) 11/12/2018 0630   PROT 4.9 (L) 11/08/2018 0205   ALBUMIN 2.5 (L) 11/08/2018 0205   AST 32 11/08/2018 0205   ALT 17 11/08/2018 0205   ALKPHOS 64 11/08/2018 0205   BILITOT 0.3 11/08/2018 0205   GFRNONAA 57 (L) 11/12/2018 0630   GFRAA >60 11/12/2018 0630   Lipase     Component Value Date/Time   LIPASE 17 11/07/2018 1131       Studies/Results: Dg Ugi W Single Cm (sol Or Thin Ba)  Result Date: 11/11/2018 CLINICAL DATA:  Perforated gastric ulcer.  Surgical repair EXAM: WATER SOLUBLE UPPER GI SERIES TECHNIQUE: Single-column upper GI series was performed using water soluble contrast. CONTRAST:  182mL OMNIPAQUE IOHEXOL 300 MG/ML  SOLN COMPARISON:  CT 11/07/2018 FLUOROSCOPY TIME:  Fluoroscopy Time:  1.6 minutes Radiation Exposure Index (if provided by the fluoroscopic device): 14.9 mGy Number of Acquired Spot Images: 14 FINDINGS: NG tube in stomach.  Surgical drain noted. Water-soluble contrast was introduced through the nasogastric tube. The tip of the tube is in the gastric antrum. Contrast filled the body the stomach and quickly flowed into the duodenum as the tube is distally placed. Patient was positioned LEFT side down to fill the gastric fundus. No evidence extraluminal contrast leaking from the stomach or duodenum. Potential small duodenum diverticulum adjacent to the duodenal bulb. Tube was flushed with water following procedure. IMPRESSION: No evidence of gastric leak following surgical repair of perforated gastric ulcer. 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 These results will be called to the ordering clinician or representative by the Radiologist Assistant,  and communication documented in the PACS or zVision Dashboard. Electronically Signed   By: Suzy Bouchard M.D.   On: 11/11/2018 10:37    Anti-infectives: Anti-infectives (From admission, onward)   Start     Dose/Rate Route Frequency Ordered Stop   11/07/18 2230  fluconazole (DIFLUCAN) IVPB 200 mg     200 mg 100 mL/hr over 60 Minutes Intravenous Daily at bedtime 11/07/18 2216     11/07/18 2200  piperacillin-tazobactam (ZOSYN)  IVPB 3.375 g     3.375 g 12.5 mL/hr over 240 Minutes Intravenous Every 8 hours 11/07/18 1541     11/07/18 1400  piperacillin-tazobactam (ZOSYN) IVPB 3.375 g     3.375 g 100 mL/hr over 30 Minutes Intravenous  Once 11/07/18 1359 11/07/18 1517       Assessment/Plan Fournier's gangrene 2017 PAD CAD s/p stent 2017 Bullous pemphigoid on chronic prednisone HTN Prostate cancer s/p prostatectomy and XRT HLD H/o tobacco abuse H/o alcohol abuse - CIWA, seems to be withdrawing now Anemia - Hg 8.7, stable AKI - Cr trending down 1.29, good UOP  Perforated posterior gastric ulcer S/pEmergencyLAPAROSCOPY DIAGNOSTIC (N/A),EXPLORATORY LAPAROTOMYand Graham patch repair of perforated posterior gastric ulcer6/6 Dr. Rosendo Gros - POD5 - H pylori Antibody 0.49, negative - continue IV protonix  - JP drain output serosanguinous  - UGI 6/10 with no leak  ID -zosyn 6/6>>, diflucan 6/6>>6/11 FEN -IVF, CLD VTE -SCDs Foley -d/c 6/8 Follow up -Dr. Rosendo Gros  Plan: UGI with no evidence of leak and patient is having bowel function. Advance to clear liquids. Mobilize. Continue CIWA for alcohol withdrawal. May need SNF at discharge.   LOS: 5 days    Wellington [place] , [facility] 11/12/2018, 9:12 AM Pager: [phone] Mon-Thurs 7:00 am-4:30 pm Fri 7:00 am -11:30 AM Sat-Sun 7:00 am-11:30 am

## 2018-11-12 NOTE — Progress Notes (Signed)
PROGRESS NOTE    ASPEN DETERDING  KDT:[id number] DOB: July 09, 1950 DOA: 11/07/2018 PCP: Gaynelle Arabian, MD   Brief Narrative:   68 year old male who presented with abdominal pain.  He does have significant past medical history of tobacco and alcohol abuse.  He reported abrupt onset of progressive abdominal pain, that prompted him to come to the hospital.  Physical examination blood pressure 124/73, heart rate 61, temperature 97.6, respiratory rate 18, oxygen saturation 98%, he was awake and alert, his lungs are clear to auscultation bilaterally, heart S1-S2 present and rhythmic, his abdomen was tender in the epigastric region, decreased bowel sounds, positive distention, no lower extremity edema. NA 131, potassium 3.0, chloride 115, BUN 11, glucose 81, BUN 17, creatinine 1.0, white count 7.6, hemoglobin 8.1, hematocrit 25.5, platelets 245, SARS COVID-19 negative.  Urine analysis 21-50 white cells, 21-50 red cells.  Alcohol level less than 10.  CT of the abdomen showed extensive free intraperitoneal air and fluid throughout the abdomen and pelvis.  Favored etiology peptic ulcer disease, possibly in the gastric antrum.  Negative for pulmonary embolism.  Right upper lobe scaring.  Chest radiograph negative for infiltrates.  EKG 48 bpm, normal axis, sinus rhythm, QTC 480, low voltage, no ST segment or T wave changes, one nonconducting PAC.  Patient was admitted to the intensive care unit working diagnosis of perforated viscus.   Assessment & Plan:   Principal Problem:   Perforated gastric ulcer s/p omental patch 11/07/2018 Active Problems:   Acute abdomen   Pressure injury of skin   Alcohol abuse   Perforated posterior gastric ulcer, s/p exploratory laparotomy and [place]ord Field. Patient presented to [facility] long ED with progressive abdominal discomfort.  CT abdomen/pelvis notable for extensive free intraperitoneal air and fluid.  Patient underwent exploratory laparotomy with Phillip Heal patch repair  11/07/2018 by Dr. Rosendo Gros.  H. pylori antibody was negative. --General surgery following, appreciate assistance --UGI series today with no evidence of gastric leak and questionable small duodenal diverticulum. --JP drain in place,  mL's out past 24 hours --Continue Protonix 40 mg IV twice daily --D5 1/2NS at 34mL per hour --Continue empiric antimicrobial coverage with Zosyn  --NG tube removed, started on clear liquid diet, further advancement per surgery  AKI Creatinine on admission 1.03, peaked at 1.57.  Now trending down. --Creatinine 1.29 today --Continue IV fluid hydration as above while n.p.o. --Renally dose all medications, avoid nephrotoxins --Daily BMP  EtOH withdrawal  ICU/hospital-acquired delirium EtOH level on admission less than 10. CIWAA scores up to 11 past 24h.  Required 3 doses of Ativan past 24 hours. --continue to monitor on CIWAA --Ativan prn --Continue folic acid and thiamine --Encourage proper sleep hygiene  T2DM Hemoglobin A1c 5.8 on 09/09/2015.  Diet controlled and not on any home medications. --Update hemoglobin A1c in the a.m. --Continue to monitor CBG's   HTN Patient on metoprolol 25 mg per day at home --SBP 118 to 145 mmHg, --continue close monitoring, resume home metoprolol once can tolerate diet and more alert  Chronic ambulatory dysfunction with sacral stage 2 decubitus ulcer, present on admission --Continue local wound care, physical therapy and out of bed as tolerated. --Will need home health PT is SNF on discharge  Multifactorial chronic anemia Iron studies remarkable for iron 15, TIBC 119, ferritin 281, transferrin 85, consistent with anemia of chronic disease; likely exacerbated by some postop anemia.  No signs of active bleeding. --Hgb 7.0-->8.7 --Transfused 1 unit PRBCs 6/12 --Repeat CBC in the a.m.    DVT prophylaxis:  SCDs Code Status: Full code Family Communication: none Disposition Plan: Continue inpatient, further dependent on  clinical course, anticipate discharge home with home health services when medically ready   Consultants:   General surgery  Procedures:   S/pEmergencyLAPAROSCOPY DIAGNOSTIC (N/A),EXPLORATORY LAPAROTOMYand Graham patch repair of perforated posterior gastric ulceron 6/6 Dr. Rosendo Gros  Antimicrobials:   Zosyn 6/6>>  Fluconazole 6/6>>   Subjective: Patient seen and examined at bedside, nursing present.  Slightly anxious this morning.  General surgery planning on upper GI series to evaluate repair of his gastric ulcer.  Continues to be n.p.o. with NG tube in place.  He is alert and oriented.  Abdominal pain is markedly improved.  Reports bowel movement early this morning.  Wants to know when he can have his NG tube removed.  No other complaints at this time.  Denies headache, no fever/chills/night sweats, no chest pain, no palpitations, no shortness of breath.  No acute concerns overnight per nursing staff.  Will transfer from stepdown to telemetry today.  Objective: Vitals:   11/11/18 1754 11/11/18 1800 11/11/18 2126 11/12/18 0549  BP: (!) 145/107  (!) 148/108 (!) 126/107  Pulse: (!) 106  68 77  Resp: 18  17 18   Temp: 97.8 F (36.6 C)  97.6 F (36.4 C) 98 F (36.7 C)  TempSrc: Oral  Axillary Axillary  SpO2: (!) 84% 96% 98% 96%  Weight:      Height:        Intake/Output Summary (Last 24 hours) at 11/12/2018 1315 Last data filed at 11/12/2018 0858 Gross per 24 hour  Intake 1447.08 ml  Output 17.5 ml  Net 1429.58 ml   Filed Weights   11/09/18 0500 11/10/18 0500 11/11/18 0447  Weight: 59.8 kg 62.8 kg 60.4 kg    Examination:  General exam: Slightly anxious, no acute distress Respiratory system: Clear to auscultation. Respiratory effort normal. Cardiovascular system: S1 & S2 heard, RRR. No JVD, murmurs, rubs, gallops or clicks. No pedal edema. Gastrointestinal system: Abdomen is slightly distended, soft and nontender. No organomegaly or masses felt.  Surgical wound noted,  C/D/I, faint/hypoactive bowel sounds. Central nervous system: Alert and oriented. No focal neurological deficits. Extremities: Symmetric 5 x 5 power. Skin: No rashes, lesions or ulcers Psychiatry: Judgement and insight appear normal.  Slightly anxious.    Data Reviewed: I have personally reviewed following labs and imaging studies  CBC: Recent Labs  Lab 11/07/18 1131 11/08/18 0205 11/09/18 0310 11/11/18 0440 11/12/18 0630  WBC 7.6 11.1* 9.7 7.1 8.3  NEUTROABS  --   --   --  5.5  --   HGB 8.1* 8.4* 7.6* 7.0* 8.7*  HCT 25.5* 27.3* 24.0* 22.1* 26.9*  MCV 94.1 98.6 96.4 96.5 94.1  PLT 245 199 175 207 818   Basic Metabolic Panel: Recent Labs  Lab 11/07/18 1131 11/07/18 1231 11/08/18 0205 11/09/18 0310 11/11/18 0440 11/12/18 0630  NA 131*  --  130* 134* 136 135  K 3.0*  --  4.5 4.6 4.1 3.5  CL 115*  --  106 110 111 108  CO2 11*  --  15* 18* 18* 18*  GLUCOSE 61*  --  115* 137* 128* 116*  BUN 17  --  22 18 13 14   CREATININE 1.03  --  1.57* 1.30* 1.33* 1.29*  CALCIUM 4.2*  --  6.9* 7.4* 7.6* 8.0*  MG  --  1.0* 2.0  --  1.5*  --   PHOS  --   --  2.4*  --   --   --  GFR: Estimated Creatinine Clearance: 48.1 mL/min (A) (by C-G formula based on SCr of 1.29 mg/dL (H)). Liver Function Tests: Recent Labs  Lab 11/07/18 1131 11/08/18 0205  AST 18 32  ALT 10 17  ALKPHOS 56 64  BILITOT 0.3 0.3  PROT <3.0* 4.9*  ALBUMIN 1.1* 2.5*   Recent Labs  Lab 11/07/18 1131  LIPASE 17   No results for input(s): AMMONIA in the last 168 hours. Coagulation Profile: Recent Labs  Lab 11/07/18 1457  INR 1.3*   Cardiac Enzymes: Recent Labs  Lab 11/07/18 1135  TROPONINI <0.03   BNP (last 3 results) No results for input(s): PROBNP in the last 8760 hours. HbA1C: Recent Labs    11/12/18 0630  HGBA1C 5.2   CBG: Recent Labs  Lab 11/11/18 0308 11/11/18 0744 11/11/18 0746 11/11/18 1152 11/11/18 1554  GLUCAP 112* 121* 119* 108* 93   Lipid Profile: No results for  input(s): CHOL, HDL, LDLCALC, TRIG, CHOLHDL, LDLDIRECT in the last 72 hours. Thyroid Function Tests: No results for input(s): TSH, T4TOTAL, FREET4, T3FREE, THYROIDAB in the last 72 hours. Anemia Panel: Recent Labs    11/11/18 0239  FERRITIN 281  TIBC 119*  IRON 15*   Sepsis Labs: Recent Labs  Lab 11/07/18 1131  LATICACIDVEN 1.5    Recent Results (from the past 240 hour(s))  SARS Coronavirus 2 (CEPHEID- Performed in [facility] hospital lab), Hosp Order     Status: None   Collection Time: 11/07/18 11:18 AM   Specimen: Nasopharyngeal Swab  Result Value Ref Range Status   SARS Coronavirus 2 NEGATIVE NEGATIVE Final    Comment: (NOTE) If result is NEGATIVE SARS-CoV-2 target nucleic acids are NOT DETECTED. The SARS-CoV-2 RNA is generally detectable in upper and lower  respiratory specimens during the acute phase of infection. The lowest  concentration of SARS-CoV-2 viral copies this assay can detect is 250  copies / mL. A negative result does not preclude SARS-CoV-2 infection  and should not be used as the sole basis for treatment or other  patient management decisions.  A negative result may occur with  improper specimen collection / handling, submission of specimen other  than nasopharyngeal swab, presence of viral mutation(s) within the  areas targeted by this assay, and inadequate number of viral copies  (<250 copies / mL). A negative result must be combined with clinical  observations, patient history, and epidemiological information. If result is POSITIVE SARS-CoV-2 target nucleic acids are DETECTED. The SARS-CoV-2 RNA is generally detectable in upper and lower  respiratory specimens dur ing the acute phase of infection.  Positive  results are indicative of active infection with SARS-CoV-2.  Clinical  correlation with patient history and other diagnostic information is  necessary to determine patient infection status.  Positive results do  not rule out bacterial  infection or co-infection with other viruses. If result is PRESUMPTIVE POSTIVE SARS-CoV-2 nucleic acids MAY BE PRESENT.   A presumptive positive result was obtained on the submitted specimen  and confirmed on repeat testing.  While 2019 novel coronavirus  (SARS-CoV-2) nucleic acids may be present in the submitted sample  additional confirmatory testing may be necessary for epidemiological  and / or clinical management purposes  to differentiate between  SARS-CoV-2 and other Sarbecovirus currently known to infect humans.  If clinically indicated additional testing with an alternate test  methodology [phone]) is advised. The SARS-CoV-2 RNA is generally  detectable in upper and lower respiratory sp ecimens during the acute  phase of infection. The expected  result is Negative. Fact Sheet for Patients:  StrictlyIdeas.no Fact Sheet for Healthcare Providers: BankingDealers.co.za This test is not yet approved or cleared by the [place] FDA and has been authorized for detection and/or diagnosis of SARS-CoV-2 by FDA under an Emergency Use Authorization (EUA).  This EUA will remain in effect (meaning this test can be used) for the duration of the COVID-19 declaration under Section 564(b)(1) of the Act, 21 U.S.C. section 360bbb-3(b)(1), unless the authorization is terminated or revoked sooner. Performed at [facility], [place] [address] [place], [place] [address]   Blood culture (routine x 2)     Status: Abnormal   Collection Time: 11/07/18 11:19 AM   Specimen: BLOOD  Result Value Ref Range Status   Specimen Description   Final    BLOOD LEFT ANTECUBITAL Performed at [facility], [place] [address] [place], [place] [address]    Special Requests   Final    BOTTLES DRAWN AEROBIC AND ANAEROBIC Blood Culture results may not be optimal due to an inadequate volume of blood received in culture bottles Performed at [place] [address] [place],  [address]    Culture  Setup Time   Final    GRAM POSITIVE COCCI AEROBIC BOTTLE ONLY CRITICAL RESULT CALLED TO, READ BACK BY AND VERIFIED WITH: SWAYNE  PHARMD AT [id number] ON [id number] BY SJW    Culture (A)  Final    STAPHYLOCOCCUS SPECIES (COAGULASE NEGATIVE) THE SIGNIFICANCE OF ISOLATING THIS ORGANISM FROM A SINGLE VENIPUNCTURE CANNOT BE PREDICTED WITHOUT FURTHER CLINICAL AND CULTURE CORRELATION. SUSCEPTIBILITIES AVAILABLE ONLY ON REQUEST. Performed at [facility], [place] [address] [place],  [address]    Report Status 11/09/2018 FINAL  Final  Blood Culture ID Panel (Reflexed)     Status: Abnormal   Collection Time: 11/07/18 11:19 AM  Result Value Ref Range Status   Enterococcus species NOT DETECTED NOT DETECTED Final   Listeria monocytogenes NOT DETECTED NOT DETECTED Final   Staphylococcus species DETECTED (A) NOT DETECTED Final    Comment: Methicillin (oxacillin) susceptible coagulase negative staphylococcus. Possible blood culture contaminant (unless isolated from more than one blood culture draw or clinical case suggests pathogenicity). No antibiotic treatment is indicated for blood  culture contaminants. CRITICAL RESULT CALLED TO, READ BACK BY AND VERIFIED WITH: SWAYNE PHARMD AT 1114 ON [id number] BY SJW    Staphylococcus aureus (BCID) NOT DETECTED NOT DETECTED Final   Methicillin resistance NOT DETECTED NOT DETECTED Final   Streptococcus species NOT DETECTED NOT DETECTED Final   Streptococcus agalactiae NOT DETECTED NOT DETECTED Final   Streptococcus pneumoniae NOT DETECTED NOT DETECTED Final   Streptococcus pyogenes NOT DETECTED NOT DETECTED Final   Acinetobacter baumannii NOT DETECTED NOT DETECTED Final   Enterobacteriaceae species NOT DETECTED NOT DETECTED Final   Enterobacter cloacae complex NOT DETECTED NOT DETECTED Final   Escherichia coli NOT DETECTED NOT DETECTED Final   Klebsiella oxytoca NOT DETECTED NOT DETECTED  Final   Klebsiella pneumoniae NOT DETECTED NOT DETECTED Final   Proteus species NOT DETECTED NOT DETECTED Final   Serratia marcescens NOT DETECTED NOT DETECTED Final   Haemophilus influenzae NOT DETECTED NOT DETECTED Final   Neisseria meningitidis NOT DETECTED NOT DETECTED Final   Pseudomonas aeruginosa NOT DETECTED NOT DETECTED Final   Candida albicans NOT DETECTED NOT DETECTED Final   Candida glabrata NOT DETECTED NOT DETECTED Final   Candida krusei NOT DETECTED NOT DETECTED Final   Candida parapsilosis NOT DETECTED NOT DETECTED Final  Candida tropicalis NOT DETECTED NOT DETECTED Final    Comment: Performed at [facility], [place] [address], [name], [place] [address]  MRSA PCR Screening     Status: Abnormal   Collection Time: 11/07/18 11:46 PM   Specimen: Nasal Mucosa; Nasopharyngeal  Result Value Ref Range Status   MRSA by PCR POSITIVE (A) NEGATIVE Final    Comment:        The GeneXpert MRSA Assay (FDA approved for NASAL specimens only), is one component of a comprehensive MRSA colonization surveillance program. It is not intended to diagnose MRSA infection nor to guide or monitor treatment for MRSA infections. RESULT CALLED TO, READ BACK BY AND VERIFIED WITH: LANEY,S RN @0233  ON 11/08/2018 JACKSON,K Performed at [facility], [place] [address], [place], [place] [address]          Radiology Studies: Dg Paulene Floor Single Cm (sol Or Thin Ba)  Result Date: 11/11/2018 CLINICAL DATA:  Perforated gastric ulcer.  Surgical repair EXAM: WATER SOLUBLE UPPER GI SERIES TECHNIQUE: Single-column upper GI series was performed using water soluble contrast. CONTRAST:  134mL OMNIPAQUE IOHEXOL 300 MG/ML  SOLN COMPARISON:  CT 11/07/2018 FLUOROSCOPY TIME:  Fluoroscopy Time:  1.6 minutes Radiation Exposure Index (if provided by the fluoroscopic device): 14.9 mGy Number of Acquired Spot Images: 14 FINDINGS: NG tube in stomach.  Surgical drain noted. Water-soluble contrast was  introduced through the nasogastric tube. The tip of the tube is in the gastric antrum. Contrast filled the body the stomach and quickly flowed into the duodenum as the tube is distally placed. Patient was positioned LEFT side down to fill the gastric fundus. No evidence extraluminal contrast leaking from the stomach or duodenum. Potential small duodenum diverticulum adjacent to the duodenal bulb. Tube was flushed with water following procedure. IMPRESSION: No evidence of gastric leak following surgical repair of perforated gastric ulcer. 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 These results will be called to the ordering clinician or representative by the Radiologist Assistant, and communication documented in the PACS or zVision Dashboard. Electronically Signed   By: Suzy Bouchard M.D.   On: 11/11/2018 10:37        Scheduled Meds:  Chlorhexidine Gluconate Cloth  6 each Topical Daily   feeding supplement  1 Container Oral TID BM   folic acid  1 mg Intravenous Daily   latanoprost  1 drop Both Eyes QHS   mupirocin ointment  1 application Nasal BID   pantoprazole (PROTONIX) IV  40 mg Intravenous Q12H   sodium chloride flush  10-40 mL Intracatheter Q12H   thiamine injection  100 mg Intravenous Daily   Continuous Infusions:  sodium chloride 250 mL (11/10/18 1333)   dextrose 5 % and 0.45% NaCl 75 mL/hr at 11/12/18 0527   methocarbamol (ROBAXIN) IV     piperacillin-tazobactam (ZOSYN)  IV 3.375 g (11/12/18 0520)     LOS: 5 days    Time spent: 36 minutes    Markelle Asaro J [place] DO [facility] Pager [phone]  If 7PM-7AM, please contact night-coverage [email or web address] Password [email or web address] 11/12/2018, 1:15 PM

## 2018-11-12 NOTE — Care Management Important Message (Signed)
Important Message  Patient Details IM Letter given to Servando Snare SW to present to the Patient Name: Rodney [name] MRN: [id number] Date of Birth: 1951-05-29   Medicare Important Message Given:  Yes    Kerin Salen 11/12/2018, 11:30 AM

## 2018-11-13 DIAGNOSIS — F10231 Alcohol dependence with withdrawal delirium: Secondary | ICD-10-CM

## 2018-11-13 LAB — BASIC METABOLIC PANEL
Anion gap: 8 (ref 5–15)
BUN: 14 mg/dL (ref 8–23)
CO2: 19 mmol/L — ABNORMAL LOW (ref 22–32)
Calcium: 7.9 mg/dL — ABNORMAL LOW (ref 8.9–10.3)
Chloride: 108 mmol/L (ref 98–111)
Creatinine, Ser: 1.24 mg/dL (ref 0.61–1.24)
GFR calc Af Amer: >60 mL/min (ref >60)
GFR calc non Af Amer: >60 mL/min (ref >60)
Glucose, Bld: 113 mg/dL — ABNORMAL HIGH (ref 70–99)
Potassium: 3.1 mmol/L — ABNORMAL LOW (ref 3.5–5.1)
Sodium: 135 mmol/L (ref 135–145)

## 2018-11-13 LAB — CBC
HCT: 27.6 % — ABNORMAL LOW (ref 39.0–52.0)
Hemoglobin: 8.6 g/dL — ABNORMAL LOW (ref 13.0–17.0)
MCH: 29.3 pg (ref 26.0–34.0)
MCHC: 31.2 g/dL (ref 30.0–36.0)
MCV: 93.9 fL (ref 80.0–100.0)
Platelets: 191 10*3/uL (ref 150–400)
RBC: 2.94 MIL/uL — ABNORMAL LOW (ref 4.22–5.81)
RDW: 15.1 % (ref 11.5–15.5)
WBC: 8.1 10*3/uL (ref 4.0–10.5)
nRBC: 0 % (ref 0.0–0.2)

## 2018-11-13 LAB — MAGNESIUM: Magnesium: 1.5 mg/dL — ABNORMAL LOW (ref 1.7–2.4)

## 2018-11-13 MED ORDER — MAGNESIUM SULFATE 2 GM/50ML IV SOLN
2.0000 g | INTRAVENOUS | Status: AC
  Administered 2018-11-13 (×2): 2 g via INTRAVENOUS
  Filled 2018-11-13 (×2): qty 50

## 2018-11-13 MED ORDER — POTASSIUM CHLORIDE 10 MEQ/100ML IV SOLN
10.0000 meq | INTRAVENOUS | Status: AC
  Administered 2018-11-13 (×6): 10 meq via INTRAVENOUS
  Filled 2018-11-13 (×5): qty 100

## 2018-11-13 NOTE — Progress Notes (Signed)
Pharmacy Antibiotic Note Rodney [name] is a 68 y.o. male admitted on 11/07/2018 with perforated gastric ulcer.  Pharmacy had been consulted for zosyn dosing. Now POD6 EmergencyLAPAROSCOPY DIAGNOSTIC (N/A), EXPLORATORY LAPAROTOMYand Graham patch repair of perforated posterior gastric ulcer. Patient remains afebrile, WBC wnl, and Scr 1.24.  Plan: Continue Zosyn 3.375 g IV q8h (given over 4 hours) Per Surgery, zosyn to be stopped 6/13 (for total duration of 7 days antibiotics) Monitor SCr  Height: 5\' 8"  (172.7 cm) Weight: 145 lb 8.1 oz (66 kg) IBW/kg (Calculated) : 68.4  Temp (24hrs), Avg:98.1 F (36.7 C), Min:98 F (36.7 C), Max:98.2 F (36.8 C)  Recent Labs  Lab 11/07/18 1131 11/08/18 0205 11/09/18 0310 11/11/18 0440 11/12/18 0630 11/13/18 0400  WBC 7.6 11.1* 9.7 7.1 8.3 8.1  CREATININE 1.03 1.57* 1.30* 1.33* 1.29* 1.24  LATICACIDVEN 1.5  --   --   --   --   --     Estimated Creatinine Clearance: 54.7 mL/min (by C-G formula based on SCr of 1.24 mg/dL).    Allergies  Allergen Reactions  . Other Rash    polyester and metals except titanium.    Antimicrobials this admission: 6/6 Zosyn>> 6/6 Diflucan >>6/11  Dose adjustments this admission: N/A  Microbiology results: 6/6 BCx2: BCID aerobic bottle only > GP cocci > MSCNS 6/6: COVID 19: neg 6/6 MRSA PCR positive 6/7 H pylori antibody: 0.49, negative  Thank you for allowing pharmacy to be a part of this patient's care.  Leron Croak, PharmD PGY1 Pharmacy Resident 11/13/2018 10:39 AM

## 2018-11-13 NOTE — Progress Notes (Addendum)
6 Days Post-Op    CC:  Subjective: Still confused but more awake, alert, and interactive today. Denies abdominal pain. Denies n/v. Tolerating clears and having bowel function. WBC 8.1.  Objective: Vital signs in last 24 hours: Temp:  [98 F (36.7 C)-98.2 F (36.8 C)] 98 F (36.7 C) (06/12 0500) Pulse Rate:  [80-91] 80 (06/12 0500) Resp:  [17-19] 17 (06/12 0500) BP: (125-138)/(80-100) 130/80 (06/12 0500) SpO2:  [96 %-98 %] 96 % (06/12 0500) Weight:  [66 kg] 66 kg (06/12 0500) Last BM Date: 11/12/18 Nothing p.o. recorded Voided x3 Drain 105 Stool x4 Afebrile vital signs are stable Potassium is 3.1.  Mag 1.5, calcium 7.9. WBC 8.1, hemoglobin 8.7, hematocrit 27.6.   Intake/Output from previous day: 06/11 0701 - 06/12 0700 In: -  Out: 116 [Drains:115; Stool:1] Intake/Output this shift: No intake/output data recorded.  Physical exam: Gen: Alert, NAD HEENT: EOM's intact Card: RRR Pulm: CTAB, no W/R/R, effort normal Abd: Soft,nondistended, nontender, +BSheard,midline cdi without erythema or drainage, staples intact, JP drain output serous Skin: warm and dry  Lab Results:  Recent Labs    11/12/18 0630 11/13/18 0400  WBC 8.3 8.1  HGB 8.7* 8.6*  HCT 26.9* 27.6*  PLT 204 191    BMET Recent Labs    11/12/18 0630 11/13/18 0400  NA 135 135  K 3.5 3.1*  CL 108 108  CO2 18* 19*  GLUCOSE 116* 113*  BUN 14 14  CREATININE 1.29* 1.24  CALCIUM 8.0* 7.9*   PT/INR No results for input(s): LABPROT, INR in the last 72 hours.  Recent Labs  Lab 11/07/18 1131 11/08/18 0205  AST 18 32  ALT 10 17  ALKPHOS 56 64  BILITOT 0.3 0.3  PROT <3.0* 4.9*  ALBUMIN 1.1* 2.5*     Lipase     Component Value Date/Time   LIPASE 17 11/07/2018 1131     Medications: . Chlorhexidine Gluconate Cloth  6 each Topical Daily  . feeding supplement  1 Container Oral TID BM  . folic acid  1 mg Intravenous Daily  . latanoprost  1 drop Both Eyes QHS  . pantoprazole (PROTONIX)  IV  40 mg Intravenous Q12H  . sodium chloride flush  10-40 mL Intracatheter Q12H  . 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 Rosendo Gros - POD6 -H pylori Antibody 0.49, negative - continue IV protonix  - JP drain output serous after starting clear liquids - UGI 6/10 with no leak  ID -zosyn 6/6 >> day 7 , diflucan 6/6>>6/11 FEN -IVF, CLD VTE -SCDs Foley -d/c 6/8 Follow up -Dr. Rosendo Gros  Plan: Advance to full liquids. Continue JP drain for now but can likely d/c prior to discharge.   Continue antibiotics today but can stop tomorrow as this will be 7 days postop. Mobilize.  HH vs SNF at discharge depending on how he progresses.   Alcohol withdrawal is his biggest limiting factor now.   LOS: 6 days    Wellington Hampshire, [facility] Surgery 11/13/2018, 10:03 AM Pager: [phone]  Agree with above.  Looks okay.  Abdomen mildly distended.  Alphonsa Overall, MD, [facility] Surgery Pager: [phone] Office phone:  [phone]

## 2018-11-13 NOTE — Progress Notes (Signed)
PROGRESS NOTE    Rodney [name]  OAC:[id number] DOB: February 15, 1951 DOA: 11/07/2018 PCP: Gaynelle Arabian, MD   Brief Narrative:   68 year old male who presented with abdominal pain.  He does have significant past medical history of tobacco and alcohol abuse.  He reported abrupt onset of progressive abdominal pain, that prompted him to come to the hospital.  Physical examination blood pressure 124/73, heart rate 61, temperature 97.6, respiratory rate 18, oxygen saturation 98%, he was awake and alert, his lungs are clear to auscultation bilaterally, heart S1-S2 present and rhythmic, his abdomen was tender in the epigastric region, decreased bowel sounds, positive distention, no lower extremity edema. NA 131, potassium 3.0, chloride 115, BUN 11, glucose 81, BUN 17, creatinine 1.0, white count 7.6, hemoglobin 8.1, hematocrit 25.5, platelets 245, SARS COVID-19 negative.  Urine analysis 21-50 white cells, 21-50 red cells.  Alcohol level less than 10.  CT of the abdomen showed extensive free intraperitoneal air and fluid throughout the abdomen and pelvis.  Favored etiology peptic ulcer disease, possibly in the gastric antrum.  Negative for pulmonary embolism.  Right upper lobe scaring.  Chest radiograph negative for infiltrates.  EKG 48 bpm, normal axis, sinus rhythm, QTC 480, low voltage, no ST segment or T wave changes, one nonconducting PAC.  Patient was admitted to the intensive care unit working diagnosis of perforated viscus.   Assessment & Plan:   Principal Problem:   Perforated gastric ulcer s/p omental patch 11/07/2018 Active Problems:   Acute abdomen   Pressure injury of skin   Alcohol abuse   Perforated posterior gastric ulcer, s/p exploratory laparotomy and [place]. Patient presented to [facility] long ED with progressive abdominal discomfort.  CT abdomen/pelvis notable for extensive free intraperitoneal air and fluid.  Patient underwent exploratory laparotomy with Phillip Heal patch repair  11/07/2018 by Dr. Rosendo Gros.  H. pylori antibody was negative. --General surgery following, appreciate assistance --UGI series today with no evidence of gastric leak and questionable small duodenal diverticulum. --JP drain in place, 115 mL's out past 24 hours --Continue Protonix 40 mg IV twice daily, position to oral once tolerating full diet --D5 1/2NS at 63mL per hour --Continue empiric antimicrobial coverage with Zosyn; likely to discontinue tomorrow --NG tube removed, position from clear liquid to full liquid diet today by general surgery  AKI Creatinine on admission 1.03, peaked at 1.57.  Now trending down. --Creatinine 1.24 today --Continue IV fluid hydration until oral intake increases --Renally dose all medications, avoid nephrotoxins --Daily BMP  EtOH withdrawal  ICU/hospital-acquired delirium EtOH level on admission less than 10. CIWAA scores up to 11 past 24h.  Required 2 doses of Ativan past 24 hours. --continue to monitor on CIWAA --Ativan prn --Continue folic acid and thiamine --Encourage proper sleep hygiene  T2DM Hemoglobin A1c 5.8 on 09/09/2015.  Diet controlled and not on any home medications. --Update hemoglobin A1c in the a.m. --Continue to monitor CBG's   HTN Patient on metoprolol 25 mg per day at home --SBP 118 to 145 mmHg, --continue close monitoring, resume home metoprolol once can tolerate diet and more alert  Chronic ambulatory dysfunction with sacral stage 2 decubitus ulcer, present on admission --Continue local wound care, physical therapy and out of bed as tolerated. --Will need home health PT vs SNF on discharge  Multifactorial chronic anemia Iron studies remarkable for iron 15, TIBC 119, ferritin 281, transferrin 85, consistent with anemia of chronic disease; likely exacerbated by some postop anemia.  No signs of active bleeding. --Hgb 7.0-->8.7-->8.6 --Transfused 1  unit pRBCs on 6/12 --Repeat CBC in the a.m.    DVT prophylaxis: SCDs Code  Status: Full code Family Communication: none Disposition Plan: Continue inpatient, further dependent on clinical course, anticipate discharge home with home health services vs SNF when medically ready   Consultants:   General surgery  Procedures:   S/pEmergencyLAPAROSCOPY DIAGNOSTIC (N/A),EXPLORATORY LAPAROTOMYand Graham patch repair of perforated posterior gastric ulceron 6/6 Dr. Rosendo Gros  Antimicrobials:   Zosyn 6/6>>  Fluconazole 6/6>>   Subjective: Patient seen and examined at bedside, more alert this morning.  Tolerating his clear liquid diet.  Requests removal of his hand mitts.  States will not pull out any medical devices including his IV lines.  Surgery advancing his diet to full liquid today.  Do to complete his antibiotic course tomorrow. No other complaints at this time.  Denies headache, no fever/chills/night sweats, no chest pain, no palpitations, no shortness of breath.  No acute concerns overnight per nursing staff.  Will transfer from stepdown to telemetry today.  Objective: Vitals:   11/12/18 1433 11/12/18 2203 11/13/18 0500 11/13/18 1405  BP: (!) 138/100 (!) 125/92 130/80 103/77  Pulse: 91 90 80 87  Resp: 19 17 17 20   Temp: 98.2 F (36.8 C) 98 F (36.7 C) 98 F (36.7 C)   TempSrc: Oral Oral Oral   SpO2: 97% 98% 96% (!) 89%  Weight:   66 kg   Height:        Intake/Output Summary (Last 24 hours) at 11/13/2018 1406 Last data filed at 11/13/2018 0700 Gross per 24 hour  Intake -  Output 105 ml  Net -105 ml   Filed Weights   11/10/18 0500 11/11/18 0447 11/13/18 0500  Weight: 62.8 kg 60.4 kg 66 kg    Examination:  General exam: Slightly anxious, no acute distress Respiratory system: Clear to auscultation. Respiratory effort normal. Cardiovascular system: S1 & S2 heard, RRR. No JVD, murmurs, rubs, gallops or clicks. No pedal edema. Gastrointestinal system: Abdomen is slightly distended, soft and nontender. No organomegaly or masses felt.  Surgical  wound noted, C/D/I, bowel sounds present. Central nervous system: Alert and oriented x4, although with slight episodes of confusion, no focal neurological deficits. Extremities: Symmetric 5 x 5 power. Skin: No rashes, lesions or ulcers Psychiatry: Judgement and insight appear normal.  Slightly anxious.    Data Reviewed: I have personally reviewed following labs and imaging studies  CBC: Recent Labs  Lab 11/08/18 0205 11/09/18 0310 11/11/18 0440 11/12/18 0630 11/13/18 0400  WBC 11.1* 9.7 7.1 8.3 8.1  NEUTROABS  --   --  5.5  --   --   HGB 8.4* 7.6* 7.0* 8.7* 8.6*  HCT 27.3* 24.0* 22.1* 26.9* 27.6*  MCV 98.6 96.4 96.5 94.1 93.9  PLT 199 175 207 204 542   Basic Metabolic Panel: Recent Labs  Lab 11/07/18 1231 11/08/18 0205 11/09/18 0310 11/11/18 0440 11/12/18 0630 11/13/18 0400  NA  --  130* 134* 136 135 135  K  --  4.5 4.6 4.1 3.5 3.1*  CL  --  106 110 111 108 108  CO2  --  15* 18* 18* 18* 19*  GLUCOSE  --  115* 137* 128* 116* 113*  BUN  --  22 18 13 14 14   CREATININE  --  1.57* 1.30* 1.33* 1.29* 1.24  CALCIUM  --  6.9* 7.4* 7.6* 8.0* 7.9*  MG 1.0* 2.0  --  1.5*  --  1.5*  PHOS  --  2.4*  --   --   --   --  GFR: Estimated Creatinine Clearance: 54.7 mL/min (by C-G formula based on SCr of 1.24 mg/dL). Liver Function Tests: Recent Labs  Lab 11/07/18 1131 11/08/18 0205  AST 18 32  ALT 10 17  ALKPHOS 56 64  BILITOT 0.3 0.3  PROT <3.0* 4.9*  ALBUMIN 1.1* 2.5*   Recent Labs  Lab 11/07/18 1131  LIPASE 17   No results for input(s): AMMONIA in the last 168 hours. Coagulation Profile: Recent Labs  Lab 11/07/18 1457  INR 1.3*   Cardiac Enzymes: Recent Labs  Lab 11/07/18 1135  TROPONINI <0.03   BNP (last 3 results) No results for input(s): PROBNP in the last 8760 hours. HbA1C: Recent Labs    11/12/18 0630  HGBA1C 5.2   CBG: Recent Labs  Lab 11/11/18 0308 11/11/18 0744 11/11/18 0746 11/11/18 1152 11/11/18 1554  GLUCAP 112* 121* 119* 108*  93   Lipid Profile: No results for input(s): CHOL, HDL, LDLCALC, TRIG, CHOLHDL, LDLDIRECT in the last 72 hours. Thyroid Function Tests: No results for input(s): TSH, T4TOTAL, FREET4, T3FREE, THYROIDAB in the last 72 hours. Anemia Panel: Recent Labs    11/11/18 0239  FERRITIN 281  TIBC 119*  IRON 15*   Sepsis Labs: Recent Labs  Lab 11/07/18 1131  LATICACIDVEN 1.5    Recent Results (from the past 240 hour(s))  SARS Coronavirus 2 (CEPHEID- Performed in [place] hospital lab), Hosp Order     Status: None   Collection Time: 11/07/18 11:18 AM   Specimen: Nasopharyngeal Swab  Result Value Ref Range Status   SARS Coronavirus 2 NEGATIVE NEGATIVE Final    Comment: (NOTE) If result is NEGATIVE SARS-CoV-2 target nucleic acids are NOT DETECTED. The SARS-CoV-2 RNA is generally detectable in upper and lower  respiratory specimens during the acute phase of infection. The lowest  concentration of SARS-CoV-2 viral copies this assay can detect is 250  copies / mL. A negative result does not preclude SARS-CoV-2 infection  and should not be used as the sole basis for treatment or other  patient management decisions.  A negative result may occur with  improper specimen collection / handling, submission of specimen other  than nasopharyngeal swab, presence of viral mutation(s) within the  areas targeted by this assay, and inadequate number of viral copies  (<250 copies / mL). A negative result must be combined with clinical  observations, patient history, and epidemiological information. If result is POSITIVE SARS-CoV-2 target nucleic acids are DETECTED. The SARS-CoV-2 RNA is generally detectable in upper and lower  respiratory specimens dur ing the acute phase of infection.  Positive  results are indicative of active infection with SARS-CoV-2.  Clinical  correlation with patient history and other diagnostic information is  necessary to determine patient infection status.  Positive  results do  not rule out bacterial infection or co-infection with other viruses. If result is PRESUMPTIVE POSTIVE SARS-CoV-2 nucleic acids MAY BE PRESENT.   A presumptive positive result was obtained on the submitted specimen  and confirmed on repeat testing.  While 2019 novel coronavirus  (SARS-CoV-2) nucleic acids may be present in the submitted sample  additional confirmatory testing may be necessary for epidemiological  and / or clinical management purposes  to differentiate between  SARS-CoV-2 and other Sarbecovirus currently known to infect humans.  If clinically indicated additional testing with an alternate test  methodology [phone]) is advised. The SARS-CoV-2 RNA is generally  detectable in upper and lower respiratory sp ecimens during the acute  phase of infection. The expected result is  Negative. Fact Sheet for Patients:  StrictlyIdeas.no Fact Sheet for Healthcare Providers: BankingDealers.co.za This test is not yet approved or cleared by the [place] FDA and has been authorized for detection and/or diagnosis of SARS-CoV-2 by FDA under an Emergency Use Authorization (EUA).  This EUA will remain in effect (meaning this test can be used) for the duration of the COVID-19 declaration under Section 564(b)(1) of the Act, 21 U.S.C. section 360bbb-3(b)(1), unless the authorization is terminated or revoked sooner. Performed at [facility] [place] [address] [place], [place] [address]   Blood culture (routine x 2)     Status: Abnormal   Collection Time: 11/07/18 11:19 AM   Specimen: BLOOD  Result Value Ref Range Status   Specimen Description   Final    BLOOD LEFT ANTECUBITAL Performed at [facility], [place] [address], [place], [place] [address]    Special Requests   Final    BOTTLES DRAWN AEROBIC AND ANAEROBIC Blood Culture results may not be optimal due to an inadequate volume of blood received in  culture bottles Performed at [place] [address] [place], [place] [address]    Culture  Setup Time   Final    GRAM POSITIVE COCCI AEROBIC BOTTLE ONLY CRITICAL RESULT CALLED TO, READ BACK BY AND VERIFIED WITH: SWAYNE  PHARMD AT 7989 ON [id number] BY SJW    Culture (A)  Final    STAPHYLOCOCCUS SPECIES (COAGULASE NEGATIVE) THE SIGNIFICANCE OF ISOLATING THIS ORGANISM FROM A SINGLE VENIPUNCTURE CANNOT BE PREDICTED WITHOUT FURTHER CLINICAL AND CULTURE CORRELATION. SUSCEPTIBILITIES AVAILABLE ONLY ON REQUEST. Performed at [facility], [place] [address] [place], [place] [address]    Report Status 11/09/2018 FINAL  Final  Blood Culture ID Panel (Reflexed)     Status: Abnormal   Collection Time: 11/07/18 11:19 AM  Result Value Ref Range Status   Enterococcus species NOT DETECTED NOT DETECTED Final   Listeria monocytogenes NOT DETECTED NOT DETECTED Final   Staphylococcus species DETECTED (A) NOT DETECTED Final    Comment: Methicillin (oxacillin) susceptible coagulase negative staphylococcus. Possible blood culture contaminant (unless isolated from more than one blood culture draw or clinical case suggests pathogenicity). No antibiotic treatment is indicated for blood  culture contaminants. CRITICAL RESULT CALLED TO, READ BACK BY AND VERIFIED WITH: SWAYNE PHARMD AT 1114 ON [id number] BY SJW    Staphylococcus aureus (BCID) NOT DETECTED NOT DETECTED Final   Methicillin resistance NOT DETECTED NOT DETECTED Final   Streptococcus species NOT DETECTED NOT DETECTED Final   Streptococcus agalactiae NOT DETECTED NOT DETECTED Final   Streptococcus pneumoniae NOT DETECTED NOT DETECTED Final   Streptococcus pyogenes NOT DETECTED NOT DETECTED Final   Acinetobacter baumannii NOT DETECTED NOT DETECTED Final   Enterobacteriaceae species NOT DETECTED NOT DETECTED Final   Enterobacter cloacae complex NOT DETECTED NOT DETECTED Final   Escherichia coli NOT DETECTED NOT DETECTED Final    Klebsiella oxytoca NOT DETECTED NOT DETECTED Final   Klebsiella pneumoniae NOT DETECTED NOT DETECTED Final   Proteus species NOT DETECTED NOT DETECTED Final   Serratia marcescens NOT DETECTED NOT DETECTED Final   Haemophilus influenzae NOT DETECTED NOT DETECTED Final   Neisseria meningitidis NOT DETECTED NOT DETECTED Final   Pseudomonas aeruginosa NOT DETECTED NOT DETECTED Final   Candida albicans NOT DETECTED NOT DETECTED Final   Candida glabrata NOT DETECTED NOT DETECTED Final   Candida krusei NOT DETECTED NOT DETECTED Final   Candida parapsilosis NOT DETECTED NOT DETECTED Final  Candida tropicalis NOT DETECTED NOT DETECTED Final    Comment: Performed at [facility], [place] [address], [place], [place] [address]  MRSA PCR Screening     Status: Abnormal   Collection Time: 11/07/18 11:46 PM   Specimen: Nasal Mucosa; Nasopharyngeal  Result Value Ref Range Status   MRSA by PCR POSITIVE (A) NEGATIVE Final    Comment:        The GeneXpert MRSA Assay (FDA approved for NASAL specimens only), is one component of a comprehensive MRSA colonization surveillance program. It is not intended to diagnose MRSA infection nor to guide or monitor treatment for MRSA infections. RESULT CALLED TO, READ BACK BY AND VERIFIED WITH: LANEY,S RN @0233  ON 11/08/2018 JACKSON,K Performed at [facility], [place] [address], [place], [place] [address]          Radiology Studies: No results found.      Scheduled Meds: . Chlorhexidine Gluconate Cloth  6 each Topical Daily  . feeding supplement  1 Container Oral TID BM  . folic acid  1 mg Intravenous Daily  . latanoprost  1 drop Both Eyes QHS  . pantoprazole (PROTONIX) IV  40 mg Intravenous Q12H  . sodium chloride flush  10-40 mL Intracatheter Q12H  . thiamine injection  100 mg Intravenous Daily   Continuous Infusions: . sodium chloride 250 mL (11/10/18 1333)  . dextrose 5 % and 0.45% NaCl 75 mL/hr at 11/13/18 1052  .  methocarbamol (ROBAXIN) IV    . piperacillin-tazobactam (ZOSYN)  IV 3.375 g (11/13/18 0076)  . potassium chloride 10 mEq (11/13/18 1249)     LOS: 6 days    Time spent: 32 minutes    Tyshika Baldridge J [place] DO [facility] Pager [phone]  If 7PM-7AM, please contact night-coverage [email or web address] Password [facility] 11/13/2018, 2:06 PM

## 2018-11-14 LAB — BASIC METABOLIC PANEL
Anion gap: 4 — ABNORMAL LOW (ref 5–15)
BUN: 11 mg/dL (ref 8–23)
CO2: 19 mmol/L — ABNORMAL LOW (ref 22–32)
Calcium: 8 mg/dL — ABNORMAL LOW (ref 8.9–10.3)
Chloride: 111 mmol/L (ref 98–111)
Creatinine, Ser: 1.09 mg/dL (ref 0.61–1.24)
GFR calc Af Amer: >60 mL/min (ref >60)
GFR calc non Af Amer: >60 mL/min (ref >60)
Glucose, Bld: 112 mg/dL — ABNORMAL HIGH (ref 70–99)
Potassium: 3.9 mmol/L (ref 3.5–5.1)
Sodium: 134 mmol/L — ABNORMAL LOW (ref 135–145)

## 2018-11-14 LAB — CBC
HCT: 27.4 % — ABNORMAL LOW (ref 39.0–52.0)
Hemoglobin: 8.7 g/dL — ABNORMAL LOW (ref 13.0–17.0)
MCH: 30 pg (ref 26.0–34.0)
MCHC: 31.8 g/dL (ref 30.0–36.0)
MCV: 94.5 fL (ref 80.0–100.0)
Platelets: 194 10*3/uL (ref 150–400)
RBC: 2.9 MIL/uL — ABNORMAL LOW (ref 4.22–5.81)
RDW: 15.2 % (ref 11.5–15.5)
WBC: 7.3 10*3/uL (ref 4.0–10.5)
nRBC: 0 % (ref 0.0–0.2)

## 2018-11-14 LAB — MAGNESIUM: Magnesium: 2.2 mg/dL (ref 1.7–2.4)

## 2018-11-14 LAB — GLUCOSE, CAPILLARY: Glucose-Capillary: 79 mg/dL (ref 70–99)

## 2018-11-14 MED ORDER — VITAMIN B-1 100 MG PO TABS
100.0000 mg | ORAL_TABLET | Freq: Every day | ORAL | Status: DC
  Administered 2018-11-14 – 2018-11-16 (×3): 100 mg via ORAL
  Filled 2018-11-14 (×3): qty 1

## 2018-11-14 MED ORDER — PANTOPRAZOLE SODIUM 40 MG PO TBEC
40.0000 mg | DELAYED_RELEASE_TABLET | Freq: Two times a day (BID) | ORAL | Status: DC
  Administered 2018-11-14 – 2018-11-16 (×5): 40 mg via ORAL
  Filled 2018-11-14 (×5): qty 1

## 2018-11-14 MED ORDER — FOLIC ACID 1 MG PO TABS
1.0000 mg | ORAL_TABLET | Freq: Every day | ORAL | Status: DC
  Administered 2018-11-14 – 2018-11-16 (×3): 1 mg via ORAL
  Filled 2018-11-14 (×3): qty 1

## 2018-11-14 NOTE — Progress Notes (Signed)
7 Days Post-Op   Subjective/Chief Complaint: Patient much more awake today and pleasant.  Conversive.  Tolerating   Objective: Vital signs in last 24 hours: Temp:  [97.5 F (36.4 C)-98.1 F (36.7 C)] 98.1 F (36.7 C) (06/13 0425) Pulse Rate:  [69-87] 85 (06/13 0425) Resp:  [16-20] 16 (06/13 0425) BP: (103-123)/(69-90) 123/90 (06/13 0425) SpO2:  [89 %-100 %] 98 % (06/13 0425) Weight:  [69.2 kg] 69.2 kg (06/13 0425) Last BM Date: 11/13/18  Intake/Output from previous day: 06/12 0701 - 06/13 0700 In: 4252.3 [P.O.:480; I.V.:3061.2; IV Piggyback:661.1] Out: 60 [Drains:60] Intake/Output this shift: Total I/O In: 240 [P.O.:240] Out: -   Incision/Wound: Clean dry intact.  Soft nondistended  Lab Results:  Recent Labs    11/13/18 0400 11/14/18 0257  WBC 8.1 7.3  HGB 8.6* 8.7*  HCT 27.6* 27.4*  PLT 191 194   BMET Recent Labs    11/13/18 0400 11/14/18 0257  NA 135 134*  K 3.1* 3.9  CL 108 111  CO2 19* 19*  GLUCOSE 113* 112*  BUN 14 11  CREATININE 1.24 1.09  CALCIUM 7.9* 8.0*   PT/INR No results for input(s): LABPROT, INR in the last 72 hours. ABG No results for input(s): PHART, HCO3 in the last 72 hours.  Invalid input(s): PCO2, PO2  Studies/Results: No results found.  Anti-infectives: Anti-infectives (From admission, onward)   Start     Dose/Rate Route Frequency Ordered Stop   11/07/18 2230  fluconazole (DIFLUCAN) IVPB 200 mg  Status:  Discontinued     200 mg 100 mL/hr over 60 Minutes Intravenous Daily at bedtime 11/07/18 2216 11/12/18 0945   11/07/18 2200  piperacillin-tazobactam (ZOSYN) IVPB 3.375 g     3.375 g 12.5 mL/hr over 240 Minutes Intravenous Every 8 hours 11/07/18 1541 11/14/18 2159   11/07/18 1400  piperacillin-tazobactam (ZOSYN) IVPB 3.375 g     3.375 g 100 mL/hr over 30 Minutes Intravenous  Once 11/07/18 1359 11/07/18 1517      Assessment/Plan: s/p Procedure(s): LAPAROSCOPY DIAGNOSTIC (N/A) EXPLORATORY LAPAROTOMY gram patch gastric  ulcer (N/A) Fournier's gangrene 2017 PAD CAD s/p stent 2017 Bullous pemphigoid on chronic prednisone HTN Prostate cancer s/p prostatectomy and XRT HLD H/o tobacco abuse H/o alcohol abuse - CIWA,seems to be withdrawing Anemia - Hg8.6, stable AKI - Cr trending down 1.24, good UOP Hypokalemia/hypomagnesemia -being replaced On isolation for MRSA  Perforated posterior gastric ulcer S/pEmergencyLAPAROSCOPY DIAGNOSTIC (N/A),EXPLORATORY LAPAROTOMYand Graham patch repair of perforated posterior gastric ulcer -6/6 - Dr. Rosendo Gros - POD6 -H pylori Antibody 0.49, negative - continue IV protonix  - JP drain outputserous after starting clear liquids - UGI 6/10 with no leak  ID -zosyn 6/6 >> day 7 , diflucan 6/6>>6/11 FEN -IVF,CLD VTE -SCDs Foley -d/c 6/8 Follow up -Dr. Rosendo Gros  Plan: Advance to soft diet. Continue JP drain for now but can likely d/c prior to discharge.              Continue antibiotics today but can stop tomorrow as this will be 7 days postop. Mobilize.             HH vs SNF at discharge depending on how he progresses.              Alcohol withdrawal is his biggest limiting factor now but much improved today             . LOS: 7 days    Joyice Faster Leyton Magoon 11/14/2018

## 2018-11-14 NOTE — Progress Notes (Signed)
PROGRESS NOTE    Rodney [name]  CHY:[id number] DOB: [birth year] DOA: 11/07/2018 PCP: Gaynelle Arabian, MD   Brief Narrative:   68 year old male who presented with abdominal pain.  He does have significant past medical history of tobacco and alcohol abuse.  He reported abrupt onset of progressive abdominal pain, that prompted him to come to the hospital.  Physical examination blood pressure 124/73, heart rate 61, temperature 97.6, respiratory rate 18, oxygen saturation 98%, he was awake and alert, his lungs are clear to auscultation bilaterally, heart S1-S2 present and rhythmic, his abdomen was tender in the epigastric region, decreased bowel sounds, positive distention, no lower extremity edema. NA 131, potassium 3.0, chloride 115, BUN 11, glucose 81, BUN 17, creatinine 1.0, white count 7.6, hemoglobin 8.1, hematocrit 25.5, platelets 245, SARS COVID-19 negative.  Urine analysis 21-50 white cells, 21-50 red cells.  Alcohol level less than 10.  CT of the abdomen showed extensive free intraperitoneal air and fluid throughout the abdomen and pelvis.  Favored etiology peptic ulcer disease, possibly in the gastric antrum.  Negative for pulmonary embolism.  Right upper lobe scaring.  Chest radiograph negative for infiltrates.  EKG 48 bpm, normal axis, sinus rhythm, QTC 480, low voltage, no ST segment or T wave changes, one nonconducting PAC.  Patient was admitted to the intensive care unit working diagnosis of perforated viscus.   Assessment & Plan:   Principal Problem:   Perforated gastric ulcer s/p omental patch 11/07/2018 Active Problems:   Acute abdomen   Pressure injury of skin   Alcohol abuse   Perforated posterior gastric ulcer, s/p exploratory laparotomy and [place]. Patient presented [facility] with progressive abdominal discomfort.  CT abdomen/pelvis notable for extensive free intraperitoneal air and fluid.  Patient underwent exploratory laparotomy with Phillip Heal patch repair  11/07/2018 by Dr. Rosendo Gros.  H. pylori antibody was negative. --General surgery following, appreciate assistance --UGI series today with no evidence of gastric leak and questionable small duodenal diverticulum. --JP drain in place, 60 mL's out past 24 hours --Continue Protonix 40 mg PO BID --Continue empiric antimicrobial coverage with Zosyn; complete course today --Diet transition to soft diet by general surgery today --Plan removal of JP drain prior to discharge  AKI Creatinine on admission 1.03, peaked at 1.57.  Now trending down. --Creatinine 1.09 today --Continue encourage increased oral intake --Renally dose all medications, avoid nephrotoxins --Daily BMP  EtOH withdrawal  ICU/hospital-acquired delirium EtOH level on admission less than 10.  --Did not require Ativan over the past 24 hours --continue to monitor on CIWAA --Ativan prn --Continue folic acid and thiamine --Encourage proper sleep hygiene  T2DM Hemoglobin A1c 5.8 on 09/09/2015.  Diet controlled and not on any home medications. --Update hemoglobin A1c in the a.m. --Continue to monitor CBG's   HTN Patient on metoprolol 25 mg per day at home --SBP 103 to 123 mmHg past 24 hours --Continue to monitor BP closely  Chronic ambulatory dysfunction with sacral stage 2 decubitus ulcer, present on admission --Continue local wound care, physical therapy and out of bed as tolerated. --Will need home health PT vs SNF on discharge; due for repeat PT eval today  Multifactorial chronic anemia Iron studies remarkable for iron 15, TIBC 119, ferritin 281, transferrin 85, consistent with anemia of chronic disease; likely exacerbated by some postop anemia.  No signs of active bleeding. --Hgb 7.0-->8.7-->8.6-->8.7, stable --Transfused 1 unit pRBCs on 6/12    DVT prophylaxis: SCDs Code Status: Full code Family Communication: none Disposition Plan: Continue  inpatient, further dependent on clinical course, anticipate discharge home  with home health services vs SNF when medically ready, transition to soft diet today   Consultants:   General surgery  Procedures:   S/pEmergencyLAPAROSCOPY DIAGNOSTIC (N/A),EXPLORATORY LAPAROTOMYand Graham patch repair of perforated posterior gastric ulceron 6/6 Dr. Rosendo Gros  Antimicrobials:   Zosyn 6/6>>  Fluconazole 6/6>>   Subjective: Patient seen and examined at bedside this morning.  Much more alert and conversant.  General surgery advance diet to soft today.  Continues with JP drain in place with 60 mL's out past 24 hours.  No other complaints at this time. Denies headache, no fever/chills/night sweats, no chest pain, no palpitations, no shortness of breath.  No acute concerns overnight per nursing staff.  Will transfer from stepdown to telemetry today.  Objective: Vitals:   11/13/18 1951 11/13/18 2020 11/14/18 0012 11/14/18 0425  BP:  104/77 117/69 123/90  Pulse:  77 69 85  Resp:  19 19 16   Temp:  97.6 F (36.4 C) 97.6 F (36.4 C) 98.1 F (36.7 C)  TempSrc:  Oral Oral Oral  SpO2: 100% 100% 100% 98%  Weight:    69.2 kg  Height:        Intake/Output Summary (Last 24 hours) at 11/14/2018 1317 Last data filed at 11/14/2018 0801 Gross per 24 hour  Intake 4492.34 ml  Output 60 ml  Net 4432.34 ml   Filed Weights   11/11/18 0447 11/13/18 0500 11/14/18 0425  Weight: 60.4 kg 66 kg 69.2 kg    Examination:  General exam: Resting comfortably, no acute distress Respiratory system: Clear to auscultation. Respiratory effort normal. Cardiovascular system: S1 & S2 heard, RRR. No JVD, murmurs, rubs, gallops or clicks. No pedal edema. Gastrointestinal system: Abdomen is slightly distended, soft and nontender. No organomegaly or masses felt.  Surgical wound noted, C/D/I, bowel sounds present.,  JP drain noted Central nervous system: Alert and oriented x4,  no focal neurological deficits. Extremities: Symmetric 5 x 5 power. Skin: No rashes, lesions or ulcers Psychiatry:  Judgement and insight appear normal.  Slightly anxious.    Data Reviewed: I have personally reviewed following labs and imaging studies  CBC: Recent Labs  Lab 11/09/18 0310 11/11/18 0440 11/12/18 0630 11/13/18 0400 11/14/18 0257  WBC 9.7 7.1 8.3 8.1 7.3  NEUTROABS  --  5.5  --   --   --   HGB 7.6* 7.0* 8.7* 8.6* 8.7*  HCT 24.0* 22.1* 26.9* 27.6* 27.4*  MCV 96.4 96.5 94.1 93.9 94.5  PLT 175 207 204 191 213   Basic Metabolic Panel: Recent Labs  Lab 11/08/18 0205 11/09/18 0310 11/11/18 0440 11/12/18 0630 11/13/18 0400 11/14/18 0257  NA 130* 134* 136 135 135 134*  K 4.5 4.6 4.1 3.5 3.1* 3.9  CL 106 110 111 108 108 111  CO2 15* 18* 18* 18* 19* 19*  GLUCOSE 115* 137* 128* 116* 113* 112*  BUN 22 18 13 14 14 11   CREATININE 1.57* 1.30* 1.33* 1.29* 1.24 1.09  CALCIUM 6.9* 7.4* 7.6* 8.0* 7.9* 8.0*  MG 2.0  --  1.5*  --  1.5* 2.2  PHOS 2.4*  --   --   --   --   --    GFR: Estimated Creatinine Clearance: 64.5 mL/min (by C-G formula based on SCr of 1.09 mg/dL). Liver Function Tests: Recent Labs  Lab 11/08/18 0205  AST 32  ALT 17  ALKPHOS 64  BILITOT 0.3  PROT 4.9*  ALBUMIN 2.5*   No results for  input(s): LIPASE, AMYLASE in the last 168 hours. No results for input(s): AMMONIA in the last 168 hours. Coagulation Profile: Recent Labs  Lab 11/07/18 1457  INR 1.3*   Cardiac Enzymes: No results for input(s): CKTOTAL, CKMB, CKMBINDEX, TROPONINI in the last 168 hours. BNP (last 3 results) No results for input(s): PROBNP in the last 8760 hours. HbA1C: Recent Labs    11/12/18 0630  HGBA1C 5.2   CBG: Recent Labs  Lab 11/11/18 0308 11/11/18 0744 11/11/18 0746 11/11/18 1152 11/11/18 1554  GLUCAP 112* 121* 119* 108* 93   Lipid Profile: No results for input(s): CHOL, HDL, LDLCALC, TRIG, CHOLHDL, LDLDIRECT in the last 72 hours. Thyroid Function Tests: No results for input(s): TSH, T4TOTAL, FREET4, T3FREE, THYROIDAB in the last 72 hours. Anemia Panel: No results  for input(s): VITAMINB12, FOLATE, FERRITIN, TIBC, IRON, RETICCTPCT in the last 72 hours. Sepsis Labs: No results for input(s): PROCALCITON, LATICACIDVEN in the last 168 hours.  Recent Results (from the past 240 hour(s))  SARS Coronavirus 2 (CEPHEID- Performed in [facility] hospital lab), Hosp Order     Status: None   Collection Time: 11/07/18 11:18 AM   Specimen: Nasopharyngeal Swab  Result Value Ref Range Status   SARS Coronavirus 2 NEGATIVE NEGATIVE Final    Comment: (NOTE) If result is NEGATIVE SARS-CoV-2 target nucleic acids are NOT DETECTED. The SARS-CoV-2 RNA is generally detectable in upper and lower  respiratory specimens during the acute phase of infection. The lowest  concentration of SARS-CoV-2 viral copies this assay can detect is 250  copies / mL. A negative result does not preclude SARS-CoV-2 infection  and should not be used as the sole basis for treatment or other  patient management decisions.  A negative result may occur with  improper specimen collection / handling, submission of specimen other  than nasopharyngeal swab, presence of viral mutation(s) within the  areas targeted by this assay, and inadequate number of viral copies  (<250 copies / mL). A negative result must be combined with clinical  observations, patient history, and epidemiological information. If result is POSITIVE SARS-CoV-2 target nucleic acids are DETECTED. The SARS-CoV-2 RNA is generally detectable in upper and lower  respiratory specimens dur ing the acute phase of infection.  Positive  results are indicative of active infection with SARS-CoV-2.  Clinical  correlation with patient history and other diagnostic information is  necessary to determine patient infection status.  Positive results do  not rule out bacterial infection or co-infection with other viruses. If result is PRESUMPTIVE POSTIVE SARS-CoV-2 nucleic acids MAY BE PRESENT.   A presumptive positive result was obtained on the  submitted specimen  and confirmed on repeat testing.  While 2019 novel coronavirus  (SARS-CoV-2) nucleic acids may be present in the submitted sample  additional confirmatory testing may be necessary for epidemiological  and / or clinical management purposes  to differentiate between  SARS-CoV-2 and other Sarbecovirus currently known to infect humans.  If clinically indicated additional testing with an alternate test  methodology [id number]) is advised. The SARS-CoV-2 RNA is generally  detectable in upper and lower respiratory sp ecimens during the acute  phase of infection. The expected result is Negative. Fact Sheet for Patients:  StrictlyIdeas.no Fact Sheet for Healthcare Providers: BankingDealers.co.za This test is not yet approved or cleared by the [place] FDA and has been authorized for detection and/or diagnosis of SARS-CoV-2 by FDA under an Emergency Use Authorization (EUA).  This EUA will remain in effect (meaning this test can be  used) for the duration of the COVID-19 declaration under Section 564(b)(1) of the Act, 21 U.S.C. section 360bbb-3(b)(1), unless the authorization is terminated or revoked sooner. Performed at [facility], [place] [address], [place], [place] [address]   Blood culture (routine x 2)     Status: Abnormal   Collection Time: 11/07/18 11:19 AM   Specimen: BLOOD  Result Value Ref Range Status   Specimen Description   Final    BLOOD LEFT ANTECUBITAL Performed at [facility], [place] [address] [place], [place] [address]    Special Requests   Final    BOTTLES DRAWN AEROBIC AND ANAEROBIC Blood Culture results may not be optimal due to an inadequate volume of blood received in culture bottles Performed at [facility] [address], [place], [place] [address]    Culture  Setup Time   Final    GRAM POSITIVE COCCI AEROBIC BOTTLE ONLY CRITICAL RESULT CALLED TO, READ  BACK BY AND VERIFIED WITH: SWAYNE  PHARMD AT [id number] ON [id number] BY SJW    Culture (A)  Final    STAPHYLOCOCCUS SPECIES (COAGULASE NEGATIVE) THE SIGNIFICANCE OF ISOLATING THIS ORGANISM FROM A SINGLE VENIPUNCTURE CANNOT BE PREDICTED WITHOUT FURTHER CLINICAL AND CULTURE CORRELATION. SUSCEPTIBILITIES AVAILABLE ONLY ON REQUEST. Performed at [facility], [place] [address], [place], [place] [address]    Report Status 11/09/2018 FINAL  Final  Blood Culture ID Panel (Reflexed)     Status: Abnormal   Collection Time: 11/07/18 11:19 AM  Result Value Ref Range Status   Enterococcus species NOT DETECTED NOT DETECTED Final   Listeria monocytogenes NOT DETECTED NOT DETECTED Final   Staphylococcus species DETECTED (A) NOT DETECTED Final    Comment: Methicillin (oxacillin) susceptible coagulase negative staphylococcus. Possible blood culture contaminant (unless isolated from more than one blood culture draw or clinical case suggests pathogenicity). No antibiotic treatment is indicated for blood  culture contaminants. CRITICAL RESULT CALLED TO, READ BACK BY AND VERIFIED WITH: SWAYNE PHARMD AT 1114 ON [id number] BY SJW    Staphylococcus aureus (BCID) NOT DETECTED NOT DETECTED Final   Methicillin resistance NOT DETECTED NOT DETECTED Final   Streptococcus species NOT DETECTED NOT DETECTED Final   Streptococcus agalactiae NOT DETECTED NOT DETECTED Final   Streptococcus pneumoniae NOT DETECTED NOT DETECTED Final   Streptococcus pyogenes NOT DETECTED NOT DETECTED Final   Acinetobacter baumannii NOT DETECTED NOT DETECTED Final   Enterobacteriaceae species NOT DETECTED NOT DETECTED Final   Enterobacter cloacae complex NOT DETECTED NOT DETECTED Final   Escherichia coli NOT DETECTED NOT DETECTED Final   Klebsiella oxytoca NOT DETECTED NOT DETECTED Final   Klebsiella pneumoniae NOT DETECTED NOT DETECTED Final   Proteus species NOT DETECTED NOT DETECTED Final   Serratia marcescens NOT DETECTED NOT DETECTED Final    Haemophilus influenzae NOT DETECTED NOT DETECTED Final   Neisseria meningitidis NOT DETECTED NOT DETECTED Final   Pseudomonas aeruginosa NOT DETECTED NOT DETECTED Final   Candida albicans NOT DETECTED NOT DETECTED Final   Candida glabrata NOT DETECTED NOT DETECTED Final   Candida krusei NOT DETECTED NOT DETECTED Final   Candida parapsilosis NOT DETECTED NOT DETECTED Final   Candida tropicalis NOT DETECTED NOT DETECTED Final    Comment: Performed at [facility], [address], [place],  [address]  MRSA PCR Screening     Status: Abnormal   Collection Time: 11/07/18 11:46 PM   Specimen: Nasal Mucosa; Nasopharyngeal  Result Value Ref Range Status   MRSA  by PCR POSITIVE (A) NEGATIVE Final    Comment:        The GeneXpert MRSA Assay (FDA approved for NASAL specimens only), is one component of a comprehensive MRSA colonization surveillance program. It is not intended to diagnose MRSA infection nor to guide or monitor treatment for MRSA infections. RESULT CALLED TO, READ BACK BY AND VERIFIED WITH: LANEY,S RN @0233  ON 11/08/2018 JACKSON,K Performed at [facility], [place] [address] [place], [place] [address]          Radiology Studies: No results found.      Scheduled Meds:  Chlorhexidine Gluconate Cloth  6 each Topical Daily   feeding supplement  1 Container Oral TID BM   folic acid  1 mg Oral Daily   latanoprost  1 drop Both Eyes QHS   pantoprazole  40 mg Oral BID   sodium chloride flush  10-40 mL Intracatheter Q12H   thiamine  100 mg Oral Daily   Continuous Infusions:  sodium chloride 250 mL (11/10/18 1333)   piperacillin-tazobactam (ZOSYN)  IV 3.375 g (11/14/18 1316)     LOS: 7 days    Time spent: 32 minutes    Masyn Rostro J [place] DO [facility] Pager [phone]  If 7PM-7AM, please contact night-coverage [email or web address] Password [email or web address] 11/14/2018, 1:17 PM

## 2018-11-15 NOTE — Progress Notes (Signed)
8 Days Post-Op   Subjective/Chief Complaint: Resting but appropriate    Objective: Vital signs in last 24 hours: Temp:  [97.1 F (36.2 C)-98 F (36.7 C)] 97.1 F (36.2 C) (06/14 0541) Pulse Rate:  [83-105] 83 (06/14 0541) Resp:  [16-20] 16 (06/14 0541) BP: (112-131)/(72-86) 131/80 (06/14 0541) SpO2:  [93 %-100 %] 100 % (06/14 0541) Weight:  [68.7 kg] 68.7 kg (06/14 0541) Last BM Date: 11/13/18  Intake/Output from previous day: 06/13 0701 - 06/14 0700 In: 768.5 [P.O.:480; I.V.:240; IV Piggyback:48.5] Out: 50 [Drains:50] Intake/Output this shift: No intake/output data recorded.  Incision/Wound: CDI soft ND JP serous   Lab Results:  Recent Labs    11/13/18 0400 11/14/18 0257  WBC 8.1 7.3  HGB 8.6* 8.7*  HCT 27.6* 27.4*  PLT 191 194   BMET Recent Labs    11/13/18 0400 11/14/18 0257  NA 135 134*  K 3.1* 3.9  CL 108 111  CO2 19* 19*  GLUCOSE 113* 112*  BUN 14 11  CREATININE 1.24 1.09  CALCIUM 7.9* 8.0*   PT/INR No results for input(s): LABPROT, INR in the last 72 hours. ABG No results for input(s): PHART, HCO3 in the last 72 hours.  Invalid input(s): PCO2, PO2  Studies/Results: No results found.  Anti-infectives: Anti-infectives (From admission, onward)   Start     Dose/Rate Route Frequency Ordered Stop   11/07/18 2230  fluconazole (DIFLUCAN) IVPB 200 mg  Status:  Discontinued     200 mg 100 mL/hr over 60 Minutes Intravenous Daily at bedtime 11/07/18 2216 11/12/18 0945   11/07/18 2200  piperacillin-tazobactam (ZOSYN) IVPB 3.375 g  Status:  Discontinued     3.375 g 12.5 mL/hr over 240 Minutes Intravenous Every 8 hours 11/07/18 1541 11/14/18 1324   11/07/18 1400  piperacillin-tazobactam (ZOSYN) IVPB 3.375 g     3.375 g 100 mL/hr over 30 Minutes Intravenous  Once 11/07/18 1359 11/07/18 1517      Assessment/Plan: s/p Procedure(s): LAPAROSCOPY DIAGNOSTIC (N/A) EXPLORATORY LAPAROTOMY gram patch gastric ulcer (N/A) Fournier's gangrene 2017 PAD CAD  s/p stent 2017 Bullous pemphigoid on chronic prednisone HTN Prostate cancer s/p prostatectomy and XRT HLD H/o tobacco abuse H/o alcohol abuse - CIWA,seems to be withdrawing Anemia - Hg8.6, stable AKI - Cr trending down 1.24, good UOP Hypokalemia/hypomagnesemia-being replaced On isolation for MRSA  Perforated posterior gastric ulcer S/pEmergencyLAPAROSCOPY DIAGNOSTIC (N/A),EXPLORATORY LAPAROTOMYand Graham patch repair of perforated posterior gastric ulcer-6/6-Dr. Rosendo Gros - POD6 -H pylori Antibody 0.49, negative - continue IV protonix  - JP drain outputserous after starting clear liquids - UGI 6/10 with no leak  ID -zosyn 6/6 >>day 7, diflucan 6/6>>6/11 FEN -IVF,CLD VTE -SCDs Foley -d/c 6/8 Follow up -Dr. Rosendo Gros  Plan: Advance to soft diet. Continue JP drain for now but can likely d/c prior to discharge.  Continue antibiotics today but can stop tomorrow as this will be 7 days postop. Mobilize. HH vs SNF at discharge depending on how he progresses.  Alcohol withdrawal is his biggest limiting factor now but much improved today                        LOS: 8 days    Rodney [name] 11/15/2018

## 2018-11-15 NOTE — Progress Notes (Signed)
PROGRESS NOTE    Rodney [name]  JME:[id number] DOB: 1950-11-14 DOA: 11/07/2018 PCP: Gaynelle Arabian, MD   Brief Narrative:   68 year old male who presented with abdominal pain.  He does have significant past medical history of tobacco and alcohol abuse.  He reported abrupt onset of progressive abdominal pain, that prompted him to come to the hospital.  Physical examination blood pressure 124/73, heart rate 61, temperature 97.6, respiratory rate 18, oxygen saturation 98%, he was awake and alert, his lungs are clear to auscultation bilaterally, heart S1-S2 present and rhythmic, his abdomen was tender in the epigastric region, decreased bowel sounds, positive distention, no lower extremity edema. NA 131, potassium 3.0, chloride 115, BUN 11, glucose 81, BUN 17, creatinine 1.0, white count 7.6, hemoglobin 8.1, hematocrit 25.5, platelets 245, SARS COVID-19 negative.  Urine analysis 21-50 white cells, 21-50 red cells.  Alcohol level less than 10.  CT of the abdomen showed extensive free intraperitoneal air and fluid throughout the abdomen and pelvis.  Favored etiology peptic ulcer disease, possibly in the gastric antrum.  Negative for pulmonary embolism.  Right upper lobe scaring.  Chest radiograph negative for infiltrates.  EKG 48 bpm, normal axis, sinus rhythm, QTC 480, low voltage, no ST segment or T wave changes, one nonconducting PAC.  Patient was admitted to the intensive care unit working diagnosis of perforated viscus.   Assessment & Plan:   Principal Problem:   Perforated gastric ulcer s/p omental patch 11/07/2018 Active Problems:   Acute abdomen   Pressure injury of skin   Alcohol abuse   Perforated posterior gastric ulcer, s/p exploratory laparotomy and [place] Field. Patient presented to [facility] long ED with progressive abdominal discomfort.  CT abdomen/pelvis notable for extensive free intraperitoneal air and fluid.  Patient underwent exploratory laparotomy with Phillip Heal patch repair  11/07/2018 by Dr. Rosendo Gros.  H. pylori antibody was negative. --General surgery following, appreciate assistance --UGI series with no evidence of gastric leak and questionable small duodenal diverticulum. --JP drain in place, 50 mL's out past 24 hours --Continue Protonix 40 mg PO BID --Completed 7-day course of IV Zosyn on 11/14/2018 --Tolerating soft diet --Plan removal of JP drain prior to discharge  AKI Creatinine on admission 1.03, peaked at 1.57.  Now trending down. --Creatinine 1.09 today --Continue encourage increased oral intake --Renally dose all medications, avoid nephrotoxins --Daily BMP  EtOH withdrawal  ICU/hospital-acquired delirium EtOH level on admission less than 10.  --Did not require Ativan over the past 24 hours --continue to monitor on CIWAA --Ativan prn --Continue folic acid and thiamine --Encourage proper sleep hygiene  T2DM Hemoglobin A1c 5.2 on 11/12/2018.  Diet controlled and not on any home medications. --Continue to monitor CBG's   HTN Patient on metoprolol 25 mg per day at home --SBP 112 to 131 mmHg past 24 hours --Continue to monitor BP closely  Chronic ambulatory dysfunction with sacral stage 2 decubitus ulcer, present on admission --Continue local wound care, physical therapy and out of bed as tolerated. --Will need home health PT vs SNF on discharge; due for repeat PT eval  --Patient has appointment at hyperbaric/wound care center on 11/30/2018.  Multifactorial chronic anemia Iron studies remarkable for iron 15, TIBC 119, ferritin 281, transferrin 85, consistent with anemia of chronic disease; likely exacerbated by some postop anemia.  No signs of active bleeding. --Hgb 7.0-->8.7-->8.6-->8.7, stable --Transfused 1 unit pRBCs on 6/12    DVT prophylaxis: SCDs Code Status: Full code Family Communication: none Disposition Plan: Continue inpatient, further dependent on clinical  course, anticipate discharge home with home health services vs SNF  when medically ready, awaiting repeat PT evaluation following alcohol withdrawal   Consultants:   General surgery  Procedures:   S/pEmergencyLAPAROSCOPY DIAGNOSTIC (N/A),EXPLORATORY LAPAROTOMYand Graham patch repair of perforated posterior gastric ulceron 6/6 Dr. Rosendo Gros  Antimicrobials:   Zosyn 6/6 - 6/13  Fluconazole 6/6 - 6/11   Subjective: Patient seen and examined at bedside this morning, sleeping but easily arousable.  No complaints overnight.  Tolerating soft diet.  JP drain continues to be in place with 50 mL's out past 24 hours.  No other complaints at this time.  Awaiting re-eval by physical therapy now that he is out of alcohol withdrawal. Denies headache, no fever/chills/night sweats, no chest pain, no palpitations, no shortness of breath.  No acute concerns overnight per nursing staff.  Will transfer from stepdown to telemetry today.  Objective: Vitals:   11/14/18 2011 11/15/18 0541 11/15/18 0832 11/15/18 1134  BP: 126/72 131/80 124/88 (!) 128/108  Pulse: 98 83 88 97  Resp: 20 16    Temp: (!) 97.4 F (36.3 C) (!) 97.1 F (36.2 C)    TempSrc: Oral Oral    SpO2: 100% 100%    Weight:  68.7 kg    Height:        Intake/Output Summary (Last 24 hours) at 11/15/2018 1253 Last data filed at 11/15/2018 1129 Gross per 24 hour  Intake 538.54 ml  Output 40 ml  Net 498.54 ml   Filed Weights   11/13/18 0500 11/14/18 0425 11/15/18 0541  Weight: 66 kg 69.2 kg 68.7 kg    Examination:  General exam: Resting comfortably, no acute distress Respiratory system: Clear to auscultation. Respiratory effort normal. Cardiovascular system: S1 & S2 heard, RRR. No JVD, murmurs, rubs, gallops or clicks. No pedal edema. Gastrointestinal system: Abdomen is slightly distended, soft and nontender. No organomegaly or masses felt.  Surgical wound noted, C/D/I, bowel sounds present.,  JP drain noted Central nervous system: Alert and oriented x4,  no focal neurological  deficits. Extremities: Symmetric 5 x 5 power. Skin: No rashes, lesions or ulcers Psychiatry: Judgement and insight appear normal.  Slightly anxious.    Data Reviewed: I have personally reviewed following labs and imaging studies  CBC: Recent Labs  Lab 11/09/18 0310 11/11/18 0440 11/12/18 0630 11/13/18 0400 11/14/18 0257  WBC 9.7 7.1 8.3 8.1 7.3  NEUTROABS  --  5.5  --   --   --   HGB 7.6* 7.0* 8.7* 8.6* 8.7*  HCT 24.0* 22.1* 26.9* 27.6* 27.4*  MCV 96.4 96.5 94.1 93.9 94.5  PLT 175 207 204 191 631   Basic Metabolic Panel: Recent Labs  Lab 11/09/18 0310 11/11/18 0440 11/12/18 0630 11/13/18 0400 11/14/18 0257  NA 134* 136 135 135 134*  K 4.6 4.1 3.5 3.1* 3.9  CL 110 111 108 108 111  CO2 18* 18* 18* 19* 19*  GLUCOSE 137* 128* 116* 113* 112*  BUN 18 13 14 14 11   CREATININE 1.30* 1.33* 1.29* 1.24 1.09  CALCIUM 7.4* 7.6* 8.0* 7.9* 8.0*  MG  --  1.5*  --  1.5* 2.2   GFR: Estimated Creatinine Clearance: 64.5 mL/min (by C-G formula based on SCr of 1.09 mg/dL). Liver Function Tests: No results for input(s): AST, ALT, ALKPHOS, BILITOT, PROT, ALBUMIN in the last 168 hours. No results for input(s): LIPASE, AMYLASE in the last 168 hours. No results for input(s): AMMONIA in the last 168 hours. Coagulation Profile: No results for input(s): INR, PROTIME  in the last 168 hours. Cardiac Enzymes: No results for input(s): CKTOTAL, CKMB, CKMBINDEX, TROPONINI in the last 168 hours. BNP (last 3 results) No results for input(s): PROBNP in the last 8760 hours. HbA1C: No results for input(s): HGBA1C in the last 72 hours. CBG: Recent Labs  Lab 11/11/18 0744 11/11/18 0746 11/11/18 1152 11/11/18 1554 11/14/18 1637  GLUCAP 121* 119* 108* 93 79   Lipid Profile: No results for input(s): CHOL, HDL, LDLCALC, TRIG, CHOLHDL, LDLDIRECT in the last 72 hours. Thyroid Function Tests: No results for input(s): TSH, T4TOTAL, FREET4, T3FREE, THYROIDAB in the last 72 hours. Anemia Panel: No  results for input(s): VITAMINB12, FOLATE, FERRITIN, TIBC, IRON, RETICCTPCT in the last 72 hours. Sepsis Labs: No results for input(s): PROCALCITON, LATICACIDVEN in the last 168 hours.  Recent Results (from the past 240 hour(s))  SARS Coronavirus 2 (CEPHEID- Performed in [place] hospital lab), Hosp Order     Status: None   Collection Time: 11/07/18 11:18 AM   Specimen: Nasopharyngeal Swab  Result Value Ref Range Status   SARS Coronavirus 2 NEGATIVE NEGATIVE Final    Comment: (NOTE) If result is NEGATIVE SARS-CoV-2 target nucleic acids are NOT DETECTED. The SARS-CoV-2 RNA is generally detectable in upper and lower  respiratory specimens during the acute phase of infection. The lowest  concentration of SARS-CoV-2 viral copies this assay can detect is 250  copies / mL. A negative result does not preclude SARS-CoV-2 infection  and should not be used as the sole basis for treatment or other  patient management decisions.  A negative result may occur with  improper specimen collection / handling, submission of specimen other  than nasopharyngeal swab, presence of viral mutation(s) within the  areas targeted by this assay, and inadequate number of viral copies  (<250 copies / mL). A negative result must be combined with clinical  observations, patient history, and epidemiological information. If result is POSITIVE SARS-CoV-2 target nucleic acids are DETECTED. The SARS-CoV-2 RNA is generally detectable in upper and lower  respiratory specimens dur ing the acute phase of infection.  Positive  results are indicative of active infection with SARS-CoV-2.  Clinical  correlation with patient history and other diagnostic information is  necessary to determine patient infection status.  Positive results do  not rule out bacterial infection or co-infection with other viruses. If result is PRESUMPTIVE POSTIVE SARS-CoV-2 nucleic acids MAY BE PRESENT.   A presumptive positive result was obtained on  the submitted specimen  and confirmed on repeat testing.  While 2019 novel coronavirus  (SARS-CoV-2) nucleic acids may be present in the submitted sample  additional confirmatory testing may be necessary for epidemiological  and / or clinical management purposes  to differentiate between  SARS-CoV-2 and other Sarbecovirus currently known to infect humans.  If clinically indicated additional testing with an alternate test  methodology [phone]) is advised. The SARS-CoV-2 RNA is generally  detectable in upper and lower respiratory sp ecimens during the acute  phase of infection. The expected result is Negative. Fact Sheet for Patients:  StrictlyIdeas.no Fact Sheet for Healthcare Providers: BankingDealers.co.za This test is not yet approved or cleared by the [place] FDA and has been authorized for detection and/or diagnosis of SARS-CoV-2 by FDA under an Emergency Use Authorization (EUA).  This EUA will remain in effect (meaning this test can be used) for the duration of the COVID-19 declaration under Section 564(b)(1) of the Act, 21 U.S.C. section 360bbb-3(b)(1), unless the authorization is terminated or revoked sooner. Performed at  [facility], [place] [address] [place], [place] [address]   Blood culture (routine x 2)     Status: Abnormal   Collection Time: 11/07/18 11:19 AM   Specimen: BLOOD  Result Value Ref Range Status   Specimen Description   Final    BLOOD LEFT ANTECUBITAL Performed at [facility], [place] [address] [place], [place] [address]    Special Requests   Final    BOTTLES DRAWN AEROBIC AND ANAEROBIC Blood Culture results may not be optimal due to an inadequate volume of blood received in culture bottles Performed at [facility] [address], [place], [place] [address]    Culture  Setup Time   Final    GRAM POSITIVE COCCI AEROBIC BOTTLE ONLY CRITICAL RESULT CALLED TO,  READ BACK BY AND VERIFIED WITH: SWAYNE  PHARMD AT 0814 ON [id number] BY SJW    Culture (A)  Final    STAPHYLOCOCCUS SPECIES (COAGULASE NEGATIVE) THE SIGNIFICANCE OF ISOLATING THIS ORGANISM FROM A SINGLE VENIPUNCTURE CANNOT BE PREDICTED WITHOUT FURTHER CLINICAL AND CULTURE CORRELATION. SUSCEPTIBILITIES AVAILABLE ONLY ON REQUEST. Performed at [facility], [place] [address], [place], [place] [address]    Report Status 11/09/2018 FINAL  Final  Blood Culture ID Panel (Reflexed)     Status: Abnormal   Collection Time: 11/07/18 11:19 AM  Result Value Ref Range Status   Enterococcus species NOT DETECTED NOT DETECTED Final   Listeria monocytogenes NOT DETECTED NOT DETECTED Final   Staphylococcus species DETECTED (A) NOT DETECTED Final    Comment: Methicillin (oxacillin) susceptible coagulase negative staphylococcus. Possible blood culture contaminant (unless isolated from more than one blood culture draw or clinical case suggests pathogenicity). No antibiotic treatment is indicated for blood  culture contaminants. CRITICAL RESULT CALLED TO, READ BACK BY AND VERIFIED WITH: SWAYNE PHARMD AT 1114 ON [id number] BY SJW    Staphylococcus aureus (BCID) NOT DETECTED NOT DETECTED Final   Methicillin resistance NOT DETECTED NOT DETECTED Final   Streptococcus species NOT DETECTED NOT DETECTED Final   Streptococcus agalactiae NOT DETECTED NOT DETECTED Final   Streptococcus pneumoniae NOT DETECTED NOT DETECTED Final   Streptococcus pyogenes NOT DETECTED NOT DETECTED Final   Acinetobacter baumannii NOT DETECTED NOT DETECTED Final   Enterobacteriaceae species NOT DETECTED NOT DETECTED Final   Enterobacter cloacae complex NOT DETECTED NOT DETECTED Final   Escherichia coli NOT DETECTED NOT DETECTED Final   Klebsiella oxytoca NOT DETECTED NOT DETECTED Final   Klebsiella pneumoniae NOT DETECTED NOT DETECTED Final   Proteus species NOT DETECTED NOT DETECTED Final   Serratia marcescens NOT DETECTED NOT DETECTED Final    Haemophilus influenzae NOT DETECTED NOT DETECTED Final   Neisseria meningitidis NOT DETECTED NOT DETECTED Final   Pseudomonas aeruginosa NOT DETECTED NOT DETECTED Final   Candida albicans NOT DETECTED NOT DETECTED Final   Candida glabrata NOT DETECTED NOT DETECTED Final   Candida krusei NOT DETECTED NOT DETECTED Final   Candida parapsilosis NOT DETECTED NOT DETECTED Final   Candida tropicalis NOT DETECTED NOT DETECTED Final    Comment: Performed at [facility], [address] [place], [place] [address]  MRSA PCR Screening     Status: Abnormal   Collection Time: 11/07/18 11:46 PM   Specimen: Nasal Mucosa; Nasopharyngeal  Result Value Ref Range Status   MRSA by PCR POSITIVE (A) NEGATIVE Final    Comment:        The GeneXpert MRSA Assay (FDA approved for NASAL specimens only), is  one component of a comprehensive MRSA colonization surveillance program. It is not intended to diagnose MRSA infection nor to guide or monitor treatment for MRSA infections. RESULT CALLED TO, READ BACK BY AND VERIFIED WITH: LANEY,S RN @0233  ON 11/08/2018 JACKSON,K Performed at [facility], [place] [address] [place], [place] [address]          Radiology Studies: No results found.      Scheduled Meds:  Chlorhexidine Gluconate Cloth  6 each Topical Daily   feeding supplement  1 Container Oral TID BM   folic acid  1 mg Oral Daily   latanoprost  1 drop Both Eyes QHS   pantoprazole  40 mg Oral BID   sodium chloride flush  10-40 mL Intracatheter Q12H   thiamine  100 mg Oral Daily   Continuous Infusions:  sodium chloride 250 mL (11/10/18 1333)     LOS: 8 days    Time spent: 32 minutes    Jonique Kulig J [place] DO [facility] Pager [phone]  If 7PM-7AM, please contact night-coverage [email or web address] Password [facility] 11/15/2018, 12:53 PM

## 2018-11-16 DIAGNOSIS — F10239 Alcohol dependence with withdrawal, unspecified: Secondary | ICD-10-CM

## 2018-11-16 MED ORDER — PANTOPRAZOLE SODIUM 40 MG PO TBEC: 40.0000 mg | DELAYED_RELEASE_TABLET | Freq: Two times a day (BID) | ORAL | Status: DC

## 2018-11-16 MED ORDER — FOLIC ACID 1 MG PO TABS: 1.0000 mg | ORAL_TABLET | Freq: Every day | ORAL | 0 refills | Status: AC

## 2018-11-16 MED ORDER — FOLIC ACID 1 MG PO TABS: 1.0000 mg | ORAL_TABLET | Freq: Every day | ORAL | Status: DC

## 2018-11-16 MED ORDER — THIAMINE HCL 100 MG PO TABS: 100.0000 mg | ORAL_TABLET | Freq: Every day | ORAL | 0 refills | Status: AC

## 2018-11-16 MED ORDER — THIAMINE HCL 100 MG PO TABS: 100.0000 mg | ORAL_TABLET | Freq: Every day | ORAL | Status: DC

## 2018-11-16 MED ORDER — PANTOPRAZOLE SODIUM 40 MG PO TBEC: 40.0000 mg | DELAYED_RELEASE_TABLET | Freq: Two times a day (BID) | ORAL | 0 refills | Status: AC

## 2018-11-16 NOTE — TOC Transition Note (Addendum)
Transition of Care [facility]) - CM/SW Discharge Note   Patient Details  Name: Rodney [name] MRN: [id number] Date of Birth: July 26, 1950  Transition of Care [facility]) CM/SW Contact:  Lia Hopping, [place] Phone Number: 11/16/2018, 3:09 PM   Clinical Narrative:    SNF placement [facility]  Nurse call report to: [phone] Room: 606P [facility] called to transport.   Final next level of care: Skilled Nursing Facility Barriers to Discharge: No Barriers Identified   Patient Goals and CMS Choice Patient states their goals for this hospitalization and ongoing recovery are:: Rehab at facility CMS Medicare.gov Compare Post Acute Care list provided to:: Patient Choice offered to / list presented to : Adult Children  Discharge Placement              Patient chooses bed at: [facility] Patient to be transferred to facility by: [place] Name of family member notified: Patient to notify brothers Patient and family notified of of transfer: 11/16/18  Discharge Plan and Services  Home                                    Social Determinants of Health (SDOH) Interventions     Readmission Risk Interventions No flowsheet data found.

## 2018-11-16 NOTE — Progress Notes (Signed)
Physical Therapy Treatment Patient Details Name: Rodney [name] MRN: [id number] DOB: 06-20-1950 Today's Date: 11/16/2018    History of Present Illness 68 yo male admitted with perforated gastric ulcer s/p exp lap, omental patch 11/07/18, ETOH WD. Hx of PVD, CAD, prostate Ca, ETOH abuse    PT Comments    Pt able to stand and do a SPT to recliner. He stated it felt good to stand, but he also felt weak.  At this time, do not feel he could take care of self at home where he lives alone.  Recommend SNF.   Follow Up Recommendations  SNF     Equipment Recommendations  None recommended by PT    Recommendations for Other Services OT consult     Precautions / Restrictions Precautions Precautions: Fall Precaution Comments: L jp drain Restrictions Weight Bearing Restrictions: No    Mobility  Bed Mobility Overal bed mobility: Needs Assistance Bed Mobility: Rolling;Supine to Sit Rolling: Min guard   Supine to sit: Min assist     General bed mobility comments: MIN A  for trunk  Transfers Overall transfer level: Needs assistance Equipment used: Rolling walker (2 wheeled) Transfers: Sit to/from [facility] Sit to Stand: Min assist Stand pivot transfers: Min assist       General transfer comment: Cues for hand placement  Ambulation/Gait             General Gait Details: SPT only. Reports it felt good, but weak at the same time.   Stairs             Wheelchair Mobility    Modified Rankin (Stroke Patients Only)       Balance Overall balance assessment: Needs assistance Sitting-balance support: Feet unsupported;No upper extremity supported Sitting balance-Leahy Scale: Poor Sitting balance - Comments: Lost balance posterioly while taking meds and feet unsupported   Standing balance support: Bilateral upper extremity supported Standing balance-Leahy Scale: Poor                              Cognition Arousal/Alertness:  Awake/alert Behavior During Therapy: WFL for tasks assessed/performed Overall Cognitive Status: Within Functional Limits for tasks assessed                                        Exercises      General Comments General comments (skin integrity, edema, etc.): Pt had loose BM upon entry and worked on rolling while NT cleaned pt up.      Pertinent Vitals/Pain Pain Assessment: No/denies pain    Home Living                      Prior Function            PT Goals (current goals can now be found in the care plan section) Acute Rehab PT Goals PT Goal Formulation: With patient Time For Goal Achievement: 11/24/18 Potential to Achieve Goals: Good Progress towards PT goals: Progressing toward goals    Frequency    Min 2X/week      PT Plan Discharge plan needs to be updated;Frequency needs to be updated    Co-evaluation              AM-PAC PT "6 Clicks" Mobility   Outcome Measure  Help needed turning from your back to your side while  in a flat bed without using bedrails?: A Little Help needed moving from lying on your back to sitting on the side of a flat bed without using bedrails?: A Little Help needed moving to and from a bed to a chair (including a wheelchair)?: A Little Help needed standing up from a chair using your arms (e.g., wheelchair or bedside chair)?: A Little Help needed to walk in hospital room?: A Lot Help needed climbing 3-5 steps with a railing? : A Lot 6 Click Score: 16    End of Session   Activity Tolerance: Patient tolerated treatment well;Patient limited by fatigue Patient left: in chair;with call bell/phone within reach;with chair alarm set Nurse Communication: Mobility status PT Visit Diagnosis: Muscle weakness (generalized) (M62.81);Difficulty in walking, not elsewhere classified (R26.2)     Time: 1010-1038 PT Time Calculation (min) (ACUTE ONLY): 28 min  Charges:  $Therapeutic Activity: 23-37 mins                      Jawann Urbani L. Tamala Julian, [place] Pager [phone] 11/16/2018    Galen Manila 11/16/2018, 10:50 AM

## 2018-11-16 NOTE — [place] FL2 (Signed)
[place] MEDICAID FL2 LEVEL OF CARE SCREENING TOOL     IDENTIFICATION  Patient Name: Rodney [name] Birthdate: 01-16-1951 Sex: male Admission Date (Current Location): 11/07/2018  [facility] and [place] Number:  Herbalist and Address:  [facility],  [place], [place]      Provider Number: [id number]  Attending Physician Name and Address:  [place] Donnamarie Poag, DO  Relative Name and Phone Number:       Current Level of Care: SNF Recommended Level of Care: [place] Prior Approval Number:    Date Approved/Denied:   PASRR Number:   [id number]  Discharge Plan: SNF    Current Diagnoses: Patient Active Problem List   Diagnosis Date Noted  . Pressure injury of skin 11/09/2018  . Perforated gastric ulcer s/p omental patch 11/07/2018 11/09/2018  . Alcohol abuse 11/09/2018  . Acute abdomen 11/07/2018  . Cigarette smoker 09/08/2015  . CAD in native artery with stent 09/08/2015  . Bullous pemphigoid 09/08/2015  . Fournier's gangrene of scrotum 09/01/2015  . Benign essential HTN 09/01/2015  . 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/25/2015  . Cellulitis, scrotum 08/16/2015  . Scrotal swelling   . Cellulitis of scrotum 08/15/2015  . Hyponatremia 08/15/2015  . AKI (acute kidney injury) ([place]) 08/15/2015  . Protein calorie malnutrition ([place]) 08/15/2015  . Thrombocytopenia ([place]) 08/15/2015  . Anemia, normocytic normochromic 08/15/2015  . Scrotal abscess 08/15/2015  . Acute coronary syndrome ([place]) 08/03/2015  . Atherosclerosis of native arteries of extremity with rest pain ([place]) 02/16/2015  . PAD (peripheral artery disease) ([address]) 02/16/2015  . Ischemic leg 02/09/2015  . Dysuria 08/09/2011  . Prostate cancer ([place]) 05/03/2009    Orientation RESPIRATION BLADDER Height & Weight     Self, Time, Situation, Place  Normal Continent Weight: 151 lb 7.3 oz (68.7 kg) Height:  5\' 8"  (172.7 cm)  BEHAVIORAL SYMPTOMS/MOOD  NEUROLOGICAL BOWEL NUTRITION STATUS      Continent Diet(See discharge summary diet)  AMBULATORY STATUS COMMUNICATION OF NEEDS Skin   Extensive Assist Verbally Surgical wounds, PU Stage and Appropriate Care(Buttock, Abdomen Incision)                       Personal Care Assistance Level of Assistance  Bathing, Feeding, Dressing Bathing Assistance: Limited assistance Feeding assistance: Independent Dressing Assistance: Limited assistance     Functional Limitations Info  Sight, Hearing, Speech Sight Info: Impaired Hearing Info: Adequate Speech Info: Adequate    SPECIAL CARE FACTORS FREQUENCY  PT (By licensed PT), OT (By licensed OT)     PT Frequency: 5x/week OT Frequency: 5x/week            Contractures Contractures Info: Not present    Additional Factors Info  Code Status, Allergies, Psychotropic, Insulin Sliding Scale Code Status Info: Fullcode Allergies Info: Allergies: Other           Current Medications (11/16/2018):  This is the current hospital active medication list Current Facility-Administered Medications  Medication Dose Route Frequency Provider Last Rate Last Dose  . 0.9 %  sodium chloride infusion   Intravenous PRN Arrien, Jimmy Picket, MD 10 mL/hr at 11/15/18 1322 250 mL at 11/15/18 1322  . Chlorhexidine Gluconate Cloth 2 % PADS 6 each  6 each Topical Daily Chesley Mires, MD   6 each at 11/15/18 1315  . feeding supplement (BOOST / RESOURCE BREEZE) liquid 1 Container  1 Container Oral TID BM Meuth, Brooke A, PA-C   1 Container  at 41/96/22 2979  . folic acid (FOLVITE) tablet 1 mg  1 mg Oral Daily [place] Donnamarie Poag, DO   1 mg at 11/16/18 1025  . ipratropium-albuterol (DUONEB) 0.5-2.5 (3) MG/3ML nebulizer solution 3 mL  3 mL Nebulization Q4H PRN Ralene Ok, MD      . latanoprost (XALATAN) 0.005 % ophthalmic solution 1 drop  1 drop Both Eyes QHS Ralene Ok, MD   1 drop at 11/15/18 2202  . LORazepam (ATIVAN) injection 1 mg  1 mg Intravenous Q4H  PRN Arrien, Jimmy Picket, MD   1 mg at 11/12/18 1710  . ondansetron (ZOFRAN) injection 4 mg  4 mg Intravenous Q4H PRN Ralene Ok, MD      . pantoprazole (PROTONIX) EC tablet 40 mg  40 mg Oral BID [place] Eric J, DO   40 mg at 11/16/18 1025  . sodium chloride flush (NS) 0.9 % injection 10-40 mL  10-40 mL Intracatheter Q12H Arrien, Jimmy Picket, MD   10 mL at 11/16/18 1026  . sodium chloride flush (NS) 0.9 % injection 10-40 mL  10-40 mL Intracatheter PRN Arrien, Jimmy Picket, MD      . thiamine (VITAMIN B-1) tablet 100 mg  100 mg Oral Daily [place] Donnamarie Poag, DO   100 mg at 11/16/18 1026     Discharge Medications: Please see discharge summary for a list of discharge medications.  Relevant Imaging Results:  Relevant Lab Results:   Additional Information SSN: [id number]  Lia Hopping, LCSW

## 2018-11-16 NOTE — Evaluation (Signed)
Occupational Therapy Evaluation Patient Details Name: Rodney [name] MRN: [id number] DOB: Nov 19, 1950 Today's Date: 11/16/2018    History of Present Illness 68 yo male admitted with perforated gastric ulcer s/p exp lap, omental patch 11/07/18, ETOH WD. Hx of PVD, CAD, prostate Ca, ETOH abuse   Clinical Impression   This 68 y/o male presents with the above. PTA pt reports he is mod independent with ADL and functional mobility using SPC, was driving and performing some iADL; pt reports he lives alone. Pt mostly limited due to weakness, decreased activity tolerance, and intermittent abdominal pain at incision site. Pt currently requires min-modA for bed mobility and functional transfers; minA for seated UB ADL and mod-maxA for LB ADL. Pt will benefit from continued acute OT services and considering pt lives alone and given current level of assist recommend follow up therapy services in SNF setting after discharge to maximize his safety and independence with ADL and mobility. Will follow.     Follow Up Recommendations  SNF    Equipment Recommendations  Other (comment)(TBD in next venue)           Precautions / Restrictions Precautions Precautions: Fall Restrictions Weight Bearing Restrictions: No      Mobility Bed Mobility Overal bed mobility: Needs Assistance Bed Mobility: Rolling;Supine to Sit;Sit to Supine Rolling: Min guard   Supine to sit: Min assist Sit to supine: Mod assist   General bed mobility comments: minA for trunk elevation; assist for LEs onto EOB when returning to supine  Transfers Overall transfer level: Needs assistance Equipment used: Rolling walker (2 wheeled) Transfers: Sit to/from [facility] Sit to Stand: Mod assist         General transfer comment: pt with safe hand placement, requires boosting and steadying assist at RW, slightly elevated EOB    Balance Overall balance assessment: Needs assistance Sitting-balance support: Feet  unsupported;No upper extremity supported Sitting balance-Leahy Scale: Poor Sitting balance - Comments: pt reliant on UE support seated EOB (use of bedrail)   Standing balance support: Bilateral upper extremity supported Standing balance-Leahy Scale: Poor Standing balance comment: reliant on bil UE support                           ADL either performed or assessed with clinical judgement   ADL Overall ADL's : Needs assistance/impaired Eating/Feeding: Set up;Sitting   Grooming: Set up;Min guard;Sitting Grooming Details (indicate cue type and reason): supported sitting Upper Body Bathing: Minimal assistance;Sitting   Lower Body Bathing: Moderate assistance;Sit to/from stand   Upper Body Dressing : Minimal assistance;Sitting   Lower Body Dressing: Moderate assistance;Maximal assistance;Sit to/from stand Lower Body Dressing Details (indicate cue type and reason): minA static standing balance     Toileting- Clothing Manipulation and Hygiene: Maximal assistance;Sit to/from stand Toileting - Clothing Manipulation Details (indicate cue type and reason): pt reports he often has "leaking", as bed pad noted to be soiled during bed mobility; assisted with peri-care and changing of bed pad     Functional mobility during ADLs: Moderate assistance;Minimal assistance;Rolling walker General ADL Comments: pt with limitations due to weakness, fatigue, pain     Vision         Perception     Praxis      Pertinent Vitals/Pain Pain Assessment: Faces Faces Pain Scale: Hurts even more Pain Location: abdomen with certain movements Pain Descriptors / Indicators: Discomfort;Grimacing Pain Intervention(s): Monitored during session;Repositioned     Hand Dominance  Extremity/Trunk Assessment Upper Extremity Assessment Upper Extremity Assessment: Generalized weakness   Lower Extremity Assessment Lower Extremity Assessment: Defer to PT evaluation       Communication  Communication Communication: No difficulties   Cognition Arousal/Alertness: Awake/alert Behavior During Therapy: WFL for tasks assessed/performed Overall Cognitive Status: Within Functional Limits for tasks assessed                                     General Comments       Exercises     Shoulder Instructions      Home Living Family/patient expects to be discharged to:: Skilled nursing facility Living Arrangements: Alone   Type of Home: House                       Home Equipment: Gilford Rile - 2 wheels;Cane - single point          Prior Functioning/Environment Level of Independence: Independent with assistive device(s)        Comments: uses cane in community        OT Problem List: Decreased strength;Decreased range of motion;Decreased activity tolerance;Impaired balance (sitting and/or standing);Decreased knowledge of use of DME or AE;Pain      OT Treatment/Interventions: Self-care/ADL training;Therapeutic exercise;Energy conservation;DME and/or AE instruction;Therapeutic activities;Patient/family education;Balance training    OT Goals(Current goals can be found in the care plan section) Acute Rehab OT Goals Patient Stated Goal: rehab and then home OT Goal Formulation: With patient Time For Goal Achievement: 11/30/18 Potential to Achieve Goals: Good  OT Frequency: Min 2X/week   Barriers to D/C:            Co-evaluation              AM-PAC OT "6 Clicks" Daily Activity     Outcome Measure Help from another person eating meals?: None Help from another person taking care of personal grooming?: A Little Help from another person toileting, which includes using toliet, bedpan, or urinal?: A Lot Help from another person bathing (including washing, rinsing, drying)?: A Lot Help from another person to put on and taking off regular upper body clothing?: A Little Help from another person to put on and taking off regular lower body  clothing?: A Lot 6 Click Score: 16   End of Session Equipment Utilized During Treatment: Rolling walker Nurse Communication: Mobility status  Activity Tolerance: Patient tolerated treatment well Patient left: in bed;with call bell/phone within reach;with bed alarm set  OT Visit Diagnosis: Muscle weakness (generalized) (M62.81);Unsteadiness on feet (R26.81)                Time: 8341-9622 OT Time Calculation (min): 22 min Charges:  OT General Charges $OT Visit: 1 Visit OT Evaluation $OT Eval Moderate Complexity: [place], OT [employer] Pager [phone] Office [phone]    Raymondo Band 11/16/2018, 3:29 PM

## 2018-11-16 NOTE — Discharge Summary (Signed)
Physician Discharge Summary  Rodney [name] OZH:[id number] DOB: [birth year] DOA: 11/07/2018  PCP: Gaynelle Arabian, MD  Admit date: 11/07/2018 Discharge date: 11/16/2018  Admitted From: Home Disposition:  [facility]  Recommendations for Outpatient Follow-up:  1. Will need follow-up with [facility], Dr. Rosendo Gros for removal of staples postop day 10-14.  Home Health: N/A Equipment/Devices: none  Discharge Condition: Stable CODE STATUS: Full Code Diet recommendation: Heart Healthy /consistent carbohydrate  History of present illness:  68 year old male who presented with abdominal pain. He does have significant past medical history of tobacco and alcohol abuse. He reported abrupt onset of progressive abdominal pain,that prompted him to come to the hospital. Physical examination blood pressure 124/73, heart rate 61, temperature 97.6, respiratory rate 18, oxygen saturation 98%,he was awake and alert, his lungs are clear to auscultation bilaterally, heart S1-S2 present and rhythmic, his abdomen was tender in the epigastric region, decreased bowel sounds, positive distention, no lower extremity edema.NA131, potassium 3.0, chloride 115, BUN 11, glucose 81, BUN 17, creatinine 1.0, white count 7.6, hemoglobin 8.1, hematocrit 25.5, platelets 245,SARS COVID-19 negative. Urine analysis 21-50 white cells, 21-50 red cells. Alcohol level less than 10.CT of the abdomen showed extensive free intraperitoneal air and fluid throughout the abdomen and pelvis. Favored etiology peptic ulcer disease, possibly in the gastric antrum. Negative for pulmonary embolism. Right upper lobe scaring.Chest radiograph negative for infiltrates. EKG 48 bpm, normal axis, sinus rhythm, QTC 480, low voltage, no ST segment or T wave changes, onenonconducting PAC.  Patient was admitted to the intensive care unit working diagnosis of perforated viscus.  Hospital course:  Perforated posterior gastric  ulcer, s/p exploratory laparotomy and Engineer, structural. Patient presented to [facility] long ED with progressive abdominal discomfort.  CT abdomen/pelvis notable for extensive free intraperitoneal air and fluid.  Patient underwent exploratory laparotomy with Phillip Heal patch repair 11/07/2018 by Dr. Rosendo Gros.  H. pylori antibody was negative.  Patient underwent upper GI series with no evidence of gastric leak and questionable small duodenal diverticulum.  He continued on Protonix 40 mg p.o. twice daily.  He completed a 7-day course of IV Zosyn on 11/14/2018.  His diet was slowly advanced and tolerated soft diet at time of discharge.  His JP drain was removed by general surgery on 11/16/2018.  Patient will need outpatient follow-up with Dr. Rosendo Gros of [facility] surgery for staple removal 10-14 days postoperatively.  AKI: Resolved Creatinine on admission 1.03, peaked at 1.57.    Creatinine at time of discharge 1.09.  EtOH withdrawal  ICU/hospital-acquired delirium EtOH level on admission less than 10.  Following surgical intervention, patient started to withdrawal from alcohol and was started on CIWAA protocol.  Patient did require several doses of Ativan, none over the past 48-72 hours.  Continue to encourage alcohol cessation.  Continue folic acid, thiamine on discharge.  Hx T2DM Hemoglobin A1c 5.2 on 11/12/2018.  Diet controlled and not on any home medications.  Essential HTN Continue home metoprolol 25 mg p.o. daily, statin, aspirin  Chronic ambulatory dysfunction with sacral stage 2 decubitus ulcer, present on admission Continue local wound care, physical therapy and out of bed as tolerated. Patient has appointment at hyperbaric/wound care center on 11/30/2018.  Multifactorial chronic anemia Iron studies remarkable for iron 15, TIBC 119, ferritin 281, transferrin 85, consistent with anemia of chronic disease; likely exacerbated by some postop anemia.  No signs of active bleeding.  Patient's  hemoglobin trended down to 7.0 following surgical intervention and was transfused 1 unit PRBCs on  11/13/2018.  His hemoglobin remained stable throughout the remainder of the hospital course with hemoglobin 8.7 at time of discharge.  Discharge Diagnoses:  Principal Problem:   Perforated gastric ulcer s/p omental patch 11/07/2018 Active Problems:   Acute abdomen   Pressure injury of skin   Alcohol abuse    Discharge Instructions  Discharge Instructions    Call MD for:  difficulty breathing, headache or visual disturbances   Complete by: As directed    Call MD for:  persistant dizziness or light-headedness   Complete by: As directed    Call MD for:  persistant nausea and vomiting   Complete by: As directed    Call MD for:  severe uncontrolled pain   Complete by: As directed    Call MD for:  temperature >100.4   Complete by: As directed    Diet - low sodium heart healthy   Complete by: As directed    Increase activity slowly   Complete by: As directed      Allergies as of 11/16/2018      Reactions   Other Rash   polyester and metals except titanium.      Medication List    STOP taking these medications   doxycycline 100 MG capsule Commonly known as: VIBRAMYCIN   naproxen sodium 220 MG tablet Commonly known as: ALEVE   predniSONE 10 MG tablet Commonly known as: DELTASONE   sulfamethoxazole-trimethoprim 800-160 MG tablet Commonly known as: BACTRIM DS     TAKE these medications   aspirin 81 MG EC tablet Take 1 tablet (81 mg total) by mouth daily.   atorvastatin 80 MG tablet Commonly known as: LIPITOR Take 1 tablet (80 mg total) by mouth daily at 6 PM.   folic acid 1 MG tablet Commonly known as: FOLVITE Take 1 tablet (1 mg total) by mouth daily. Start taking on: November 17, 2018   latanoprost 0.005 % ophthalmic solution Commonly known as: XALATAN Place 1 drop into both eyes at bedtime.   metoprolol succinate 25 MG 24 hr tablet Commonly known as: TOPROL-XL Take  1 tablet (25 mg total) by mouth daily. What changed: when to take this   nitroGLYCERIN 0.4 MG SL tablet Commonly known as: NITROSTAT Place 1 tablet (0.4 mg total) under the tongue every 5 (five) minutes x 3 doses as needed for chest pain.   pantoprazole 40 MG tablet Commonly known as: PROTONIX Take 1 tablet (40 mg total) by mouth 2 (two) times daily.   ProCel Powd Take 2 scoop by mouth 2 (two) times daily.   thiamine 100 MG tablet Take 1 tablet (100 mg total) by mouth daily. Start taking on: November 17, 2018      Follow-up Information    Ralene Ok, MD. Schedule an appointment as soon as possible for a visit in 3 day(s).   Specialty: General Surgery Contact information: [address] [place] [address] [phone]          Allergies  Allergen Reactions  . Other Rash    polyester and metals except titanium.    Consultations:  [facility], Dr. Rosendo Gros   Procedures/Studies: Ct Angio Chest Pe W And/or Wo Contrast  Result Date: 11/07/2018 CLINICAL DATA:  Sharp epigastric pain radiating laterally and right lower quadrant. EXAM: CT ANGIOGRAPHY CHEST CT ABDOMEN AND PELVIS WITH CONTRAST TECHNIQUE: Multidetector CT imaging of the chest was performed using the standard protocol during bolus administration of intravenous contrast. Multiplanar CT image reconstructions and MIPs were  obtained to evaluate the vascular anatomy. Multidetector CT imaging of the abdomen and pelvis was performed using the standard protocol during bolus administration of intravenous contrast. CONTRAST:  <See Chart> OMNIPAQUE IOHEXOL 300 MG/ML SOLN, 143mL OMNIPAQUE IOHEXOL 350 MG/ML SOLN COMPARISON:  Chest radiograph of earlier today. Abdominopelvic CT of 08/15/2015. No prior chest CT. FINDINGS: CTA CHEST FINDINGS Cardiovascular: The quality of this exam for evaluation of pulmonary embolism is good. No evidence of pulmonary embolism. Aortic and branch vessel atherosclerosis.  Tortuous thoracic aorta. Mild cardiomegaly, with no pericardial effusion. Multivessel coronary artery atherosclerosis. Mediastinum/Nodes: No mediastinal or hilar adenopathy. Dilated esophagus with fluid level within. Lungs/Pleura: Trace right pleural fluid. Mild motion degradation inferiorly. 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 Pleuroparenchymal scarring at the apices. A right apical irregular density measures 9 mm on 32/12 and is favored to be related to pleuroparenchymal scarring. Spiculated posterolateral right upper lobe density measures maximally 12 mm, including on image 57/12. Musculoskeletal: Left greater than right mild gynecomastia. Osteopenia. Distal left clavicular fracture is remote. Mild superior endplate compression deformities at multiple upper thoracic levels. Review of the MIP images confirms the above findings. CT ABDOMEN and PELVIS FINDINGS Hepatobiliary: Subtle irregular hepatic capsule. No focal liver lesion. Normal gallbladder, without biliary ductal dilatation. Pancreas: Mild pancreatic atrophy, without duct dilatation or acute inflammation. Spleen: Normal in size, without focal abnormality. Adrenals/Urinary Tract: Normal adrenal glands. Normal kidneys, without hydronephrosis. Decompressed urinary bladder Stomach/Bowel: Gastric antral wall thickening, accentuated by underdistention. Example image 24/3. Possible gas within the posterior wall of the gastric antrum on 25/3. Mucosal hyperenhancement is suspected within the antral pyloric region including on 26/3. Normal colon and terminal ileum. Normal caliber of small bowel loops. Suggestion of mild mucosal hyperenhancement within mid ileal loops in the right hemipelvis on image 64/3. Vascular/Lymphatic: Advanced aortic and branch vessel atherosclerosis. Surgical changes within the left groin in the region of left femoral artery dilatation at 1.6 cm today versus 1.4 cm in 2017. No abdominopelvic adenopathy. Reproductive: Prostatectomy  Other: Small volume abdominopelvic fluid. Extensive free intraperitoneal air, including within the upper abdomen on image 17/3, within the lesser sac on image 24/3. Musculoskeletal: Osteopenia. Moderate L5 compression deformity, new since 2017 Review of the MIP images confirms the above findings. IMPRESSION: 1. Extensive free intraperitoneal air and fluid throughout the abdomen and pelvis. Favored etiology is peptic ulcer disease, possibly in the gastric antrum or less likely pylorus. More mild mucosal hyperenhancement in the ileum may represent enteritis but is felt less likely to be the cause of the free intraperitoneal air. These results were called by telephone at the time of interpretation on 11/07/2018 at 1:59 pm to [facility] , who verbally acknowledged these results. 2.  No evidence of pulmonary embolism. 3. Right upper lobe irregular opacity which could represent scarring or a primary bronchogenic carcinoma. Consider outpatient PET. 4. Aortic atherosclerosis (ICD10-I70.0), coronary artery atherosclerosis and emphysema (ICD10-J43.9). 5. Osteopenia with thoracolumbar compression deformities. 6. Esophageal air fluid level suggests dysmotility or gastroesophageal reflux. 7. Possible mild cirrhosis.  Correlate with risk factors. Electronically Signed   By: Abigail Miyamoto M.D.   On: 11/07/2018 13:59   Ct Abdomen Pelvis W Contrast  Result Date: 11/07/2018 CLINICAL DATA:  Sharp epigastric pain radiating laterally and right lower quadrant. EXAM: CT ANGIOGRAPHY CHEST CT ABDOMEN AND PELVIS WITH CONTRAST TECHNIQUE: Multidetector CT imaging of the chest was performed using the standard protocol during bolus administration of intravenous contrast. Multiplanar CT image reconstructions and MIPs were obtained to evaluate the vascular anatomy.  Multidetector CT imaging of the abdomen and pelvis was performed using the standard protocol during bolus administration of intravenous contrast. CONTRAST:  <See Chart> OMNIPAQUE  IOHEXOL 300 MG/ML SOLN, 136mL OMNIPAQUE IOHEXOL 350 MG/ML SOLN COMPARISON:  Chest radiograph of earlier today. Abdominopelvic CT of 08/15/2015. No prior chest CT. FINDINGS: CTA CHEST FINDINGS Cardiovascular: The quality of this exam for evaluation of pulmonary embolism is good. No evidence of pulmonary embolism. Aortic and branch vessel atherosclerosis. Tortuous thoracic aorta. Mild cardiomegaly, with no pericardial effusion. Multivessel coronary artery atherosclerosis. Mediastinum/Nodes: No mediastinal or hilar adenopathy. Dilated esophagus with fluid level within. Lungs/Pleura: Trace right pleural fluid. Mild motion degradation inferiorly. 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 Pleuroparenchymal scarring at the apices. A right apical irregular density measures 9 mm on 32/12 and is favored to be related to pleuroparenchymal scarring. Spiculated posterolateral right upper lobe density measures maximally 12 mm, including on image 57/12. Musculoskeletal: Left greater than right mild gynecomastia. Osteopenia. Distal left clavicular fracture is remote. Mild superior endplate compression deformities at multiple upper thoracic levels. Review of the MIP images confirms the above findings. CT ABDOMEN and PELVIS FINDINGS Hepatobiliary: Subtle irregular hepatic capsule. No focal liver lesion. Normal gallbladder, without biliary ductal dilatation. Pancreas: Mild pancreatic atrophy, without duct dilatation or acute inflammation. Spleen: Normal in size, without focal abnormality. Adrenals/Urinary Tract: Normal adrenal glands. Normal kidneys, without hydronephrosis. Decompressed urinary bladder Stomach/Bowel: Gastric antral wall thickening, accentuated by underdistention. Example image 24/3. Possible gas within the posterior wall of the gastric antrum on 25/3. Mucosal hyperenhancement is suspected within the antral pyloric region including on 26/3. Normal colon and terminal ileum. Normal caliber of small bowel loops.  Suggestion of mild mucosal hyperenhancement within mid ileal loops in the right hemipelvis on image 64/3. Vascular/Lymphatic: Advanced aortic and branch vessel atherosclerosis. Surgical changes within the left groin in the region of left femoral artery dilatation at 1.6 cm today versus 1.4 cm in 2017. No abdominopelvic adenopathy. Reproductive: Prostatectomy Other: Small volume abdominopelvic fluid. Extensive free intraperitoneal air, including within the upper abdomen on image 17/3, within the lesser sac on image 24/3. Musculoskeletal: Osteopenia. Moderate L5 compression deformity, new since 2017 Review of the MIP images confirms the above findings. IMPRESSION: 1. Extensive free intraperitoneal air and fluid throughout the abdomen and pelvis. Favored etiology is peptic ulcer disease, possibly in the gastric antrum or less likely pylorus. More mild mucosal hyperenhancement in the ileum may represent enteritis but is felt less likely to be the cause of the free intraperitoneal air. These results were called by telephone at the time of interpretation on 11/07/2018 at 1:59 pm to [facility] , who verbally acknowledged these results. 2.  No evidence of pulmonary embolism. 3. Right upper lobe irregular opacity which could represent scarring or a primary bronchogenic carcinoma. Consider outpatient PET. 4. Aortic atherosclerosis (ICD10-I70.0), coronary artery atherosclerosis and emphysema (ICD10-J43.9). 5. Osteopenia with thoracolumbar compression deformities. 6. Esophageal air fluid level suggests dysmotility or gastroesophageal reflux. 7. Possible mild cirrhosis.  Correlate with risk factors. Electronically Signed   By: Abigail Miyamoto M.D.   On: 11/07/2018 13:59   Dg Chest Port 1 View  Result Date: 11/07/2018 CLINICAL DATA:  Pain and dehydration EXAM: PORTABLE CHEST 1 VIEW COMPARISON:  September 04, 2015 FINDINGS: There is no edema or consolidation. Heart size and pulmonary vascularity are normal. No adenopathy. There is  evidence of a prior fracture of the lateral left clavicle. IMPRESSION: No edema or consolidation.  Stable cardiac silhouette. Electronically Signed   By: Gwyndolyn Saxon  Jasmine December III M.D.   On: 11/07/2018 12:40   Dg Duanne Limerick W Single Cm (sol Or Thin Ba)  Result Date: 11/11/2018 CLINICAL DATA:  Perforated gastric ulcer.  Surgical repair EXAM: WATER SOLUBLE UPPER GI SERIES TECHNIQUE: Single-column upper GI series was performed using water soluble contrast. CONTRAST:  174mL OMNIPAQUE IOHEXOL 300 MG/ML  SOLN COMPARISON:  CT 11/07/2018 FLUOROSCOPY TIME:  Fluoroscopy Time:  1.6 minutes Radiation Exposure Index (if provided by the fluoroscopic device): 14.9 mGy Number of Acquired Spot Images: 14 FINDINGS: NG tube in stomach.  Surgical drain noted. Water-soluble contrast was introduced through the nasogastric tube. The tip of the tube is in the gastric antrum. Contrast filled the body the stomach and quickly flowed into the duodenum as the tube is distally placed. Patient was positioned LEFT side down to fill the gastric fundus. No evidence extraluminal contrast leaking from the stomach or duodenum. Potential small duodenum diverticulum adjacent to the duodenal bulb. Tube was flushed with water following procedure. IMPRESSION: No evidence of gastric leak following surgical repair of perforated gastric ulcer. 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 These results will be called to the ordering clinician or representative by the Radiologist Assistant, and communication documented in the PACS or zVision Dashboard. Electronically Signed   By: Suzy Bouchard M.D.   On: 11/11/2018 10:37       Subjective: Patient seen and examined at bedside this morning, resting comfortably in bed.  No complaints overnight.  Tolerating soft diet.  JP drain removed this morning by general surgery.  Ready for discharge to SNF.  No other complaints at this time.  Denies headache, no fever/chills/night sweats, no chest  pain, no palpitations, no shortness of breath, no abdominal pain.  No acute events overnight per nursing staff.   Discharge Exam: Vitals:   11/16/18 0015 11/16/18 0601  BP: 125/77 103/72  Pulse: 94 89  Resp: 18 16  Temp: 97.8 F (36.6 C) 97.8 F (36.6 C)  SpO2: 100% 98%   Vitals:   11/15/18 1600 11/15/18 2129 11/16/18 0015 11/16/18 0601  BP: 113/81 121/75 125/77 103/72  Pulse: 87 93 94 89  Resp:  17 18 16   Temp:  97.9 F (36.6 C) 97.8 F (36.6 C) 97.8 F (36.6 C)  TempSrc:  Oral Oral Oral  SpO2:  99% 100% 98%  Weight:      Height:        General: Pt is alert, awake, not in acute distress, appears older than stated age Cardiovascular: RRR, S1/S2 +, no rubs, no gallops Respiratory: CTA bilaterally, no wheezing, no rhonchi Abdominal: Soft, NT, ND, bowel sounds +, Surgical wound noted, C/D/I Extremities: no edema, no cyanosis    The results of significant diagnostics from this hospitalization (including imaging, microbiology, ancillary and laboratory) are listed below for reference.     Microbiology: Recent Results (from the past 240 hour(s))  SARS Coronavirus 2 (CEPHEID- Performed in [place] hospital lab), Hosp Order     Status: None   Collection Time: 11/07/18 11:18 AM   Specimen: Nasopharyngeal Swab  Result Value Ref Range Status   SARS Coronavirus 2 NEGATIVE NEGATIVE Final    Comment: (NOTE) If result is NEGATIVE SARS-CoV-2 target nucleic acids are NOT DETECTED. The SARS-CoV-2 RNA is generally detectable in upper and lower  respiratory specimens during the acute phase of infection. The lowest  concentration of SARS-CoV-2 viral copies this assay can detect is 250  copies / mL. A negative result does not preclude SARS-CoV-2 infection  and should not be used as the sole basis for treatment or other  patient management decisions.  A negative result may occur with  improper specimen collection / handling, submission of specimen other  than nasopharyngeal swab,  presence of viral mutation(s) within the  areas targeted by this assay, and inadequate number of viral copies  (<250 copies / mL). A negative result must be combined with clinical  observations, patient history, and epidemiological information. If result is POSITIVE SARS-CoV-2 target nucleic acids are DETECTED. The SARS-CoV-2 RNA is generally detectable in upper and lower  respiratory specimens dur ing the acute phase of infection.  Positive  results are indicative of active infection with SARS-CoV-2.  Clinical  correlation with patient history and other diagnostic information is  necessary to determine patient infection status.  Positive results do  not rule out bacterial infection or co-infection with other viruses. If result is PRESUMPTIVE POSTIVE SARS-CoV-2 nucleic acids MAY BE PRESENT.   A presumptive positive result was obtained on the submitted specimen  and confirmed on repeat testing.  While 2019 novel coronavirus  (SARS-CoV-2) nucleic acids may be present in the submitted sample  additional confirmatory testing may be necessary for epidemiological  and / or clinical management purposes  to differentiate between  SARS-CoV-2 and other Sarbecovirus currently known to infect humans.  If clinically indicated additional testing with an alternate test  methodology [phone]) is advised. The SARS-CoV-2 RNA is generally  detectable in upper and lower respiratory sp ecimens during the acute  phase of infection. The expected result is Negative. Fact Sheet for Patients:  StrictlyIdeas.no Fact Sheet for Healthcare Providers: BankingDealers.co.za This test is not yet approved or cleared by the [place] FDA and has been authorized for detection and/or diagnosis of SARS-CoV-2 by FDA under an Emergency Use Authorization (EUA).  This EUA will remain in effect (meaning this test can be used) for the duration of the COVID-19 declaration under  Section 564(b)(1) of the Act, 21 U.S.C. section 360bbb-3(b)(1), unless the authorization is terminated or revoked sooner. Performed at [facility], [place] [address], [place], [place] [address]   Blood culture (routine x 2)     Status: Abnormal   Collection Time: 11/07/18 11:19 AM   Specimen: BLOOD  Result Value Ref Range Status   Specimen Description   Final    BLOOD LEFT ANTECUBITAL Performed at [facility], [place] [address], [place], [place] [address]    Special Requests   Final    BOTTLES DRAWN AEROBIC AND ANAEROBIC Blood Culture results may not be optimal due to an inadequate volume of blood received in culture bottles Performed at [place] [address] [place], [place] [address]    Culture  Setup Time   Final    GRAM POSITIVE COCCI AEROBIC BOTTLE ONLY CRITICAL RESULT CALLED TO, READ BACK BY AND VERIFIED WITH: SWAYNE  PHARMD AT 4650 ON [id number] BY SJW    Culture (A)  Final    STAPHYLOCOCCUS SPECIES (COAGULASE NEGATIVE) THE SIGNIFICANCE OF ISOLATING THIS ORGANISM FROM A SINGLE VENIPUNCTURE CANNOT BE PREDICTED WITHOUT FURTHER CLINICAL AND CULTURE CORRELATION. SUSCEPTIBILITIES AVAILABLE ONLY ON REQUEST. Performed at [facility], [place] [address] [place], [place] [address]    Report Status 11/09/2018 FINAL  Final  Blood Culture ID Panel (Reflexed)     Status: Abnormal   Collection Time: 11/07/18 11:19 AM  Result Value Ref Range Status   Enterococcus species NOT DETECTED NOT DETECTED Final  Listeria monocytogenes NOT DETECTED NOT DETECTED Final   Staphylococcus species DETECTED (A) NOT DETECTED Final    Comment: Methicillin (oxacillin) susceptible coagulase negative staphylococcus. Possible blood culture contaminant (unless isolated from more than one blood culture draw or clinical case suggests pathogenicity). No antibiotic treatment is indicated for blood  culture contaminants. CRITICAL RESULT CALLED TO, READ BACK BY AND  VERIFIED WITH: SWAYNE PHARMD AT 1114 ON [id number] BY SJW    Staphylococcus aureus (BCID) NOT DETECTED NOT DETECTED Final   Methicillin resistance NOT DETECTED NOT DETECTED Final   Streptococcus species NOT DETECTED NOT DETECTED Final   Streptococcus agalactiae NOT DETECTED NOT DETECTED Final   Streptococcus pneumoniae NOT DETECTED NOT DETECTED Final   Streptococcus pyogenes NOT DETECTED NOT DETECTED Final   Acinetobacter baumannii NOT DETECTED NOT DETECTED Final   Enterobacteriaceae species NOT DETECTED NOT DETECTED Final   Enterobacter cloacae complex NOT DETECTED NOT DETECTED Final   Escherichia coli NOT DETECTED NOT DETECTED Final   Klebsiella oxytoca NOT DETECTED NOT DETECTED Final   Klebsiella pneumoniae NOT DETECTED NOT DETECTED Final   Proteus species NOT DETECTED NOT DETECTED Final   Serratia marcescens NOT DETECTED NOT DETECTED Final   Haemophilus influenzae NOT DETECTED NOT DETECTED Final   Neisseria meningitidis NOT DETECTED NOT DETECTED Final   Pseudomonas aeruginosa NOT DETECTED NOT DETECTED Final   Candida albicans NOT DETECTED NOT DETECTED Final   Candida glabrata NOT DETECTED NOT DETECTED Final   Candida krusei NOT DETECTED NOT DETECTED Final   Candida parapsilosis NOT DETECTED NOT DETECTED Final   Candida tropicalis NOT DETECTED NOT DETECTED Final    Comment: Performed at [facility], [address] [place], [place] [address]  MRSA PCR Screening     Status: Abnormal   Collection Time: 11/07/18 11:46 PM   Specimen: Nasal Mucosa; Nasopharyngeal  Result Value Ref Range Status   MRSA by PCR POSITIVE (A) NEGATIVE Final    Comment:        The GeneXpert MRSA Assay (FDA approved for NASAL specimens only), is one component of a comprehensive MRSA colonization surveillance program. It is not intended to diagnose MRSA infection nor to guide or monitor treatment for MRSA infections. RESULT CALLED TO, READ BACK BY AND VERIFIED WITH: LANEY,S RN @0233  ON 11/08/2018  JACKSON,K Performed at [facility], [place] [address] [place], [place] [address]      Labs: BNP (last 3 results) No results for input(s): BNP in the last 8760 hours. Basic Metabolic Panel: Recent Labs  Lab 11/11/18 0440 11/12/18 0630 11/13/18 0400 11/14/18 0257  NA 136 135 135 134*  K 4.1 3.5 3.1* 3.9  CL 111 108 108 111  CO2 18* 18* 19* 19*  GLUCOSE 128* 116* 113* 112*  BUN 13 14 14 11   CREATININE 1.33* 1.29* 1.24 1.09  CALCIUM 7.6* 8.0* 7.9* 8.0*  MG 1.5*  --  1.5* 2.2   Liver Function Tests: No results for input(s): AST, ALT, ALKPHOS, BILITOT, PROT, ALBUMIN in the last 168 hours. No results for input(s): LIPASE, AMYLASE in the last 168 hours. No results for input(s): AMMONIA in the last 168 hours. CBC: Recent Labs  Lab 11/11/18 0440 11/12/18 0630 11/13/18 0400 11/14/18 0257  WBC 7.1 8.3 8.1 7.3  NEUTROABS 5.5  --   --   --   HGB 7.0* 8.7* 8.6* 8.7*  HCT 22.1* 26.9* 27.6* 27.4*  MCV 96.5 94.1 93.9 94.5  PLT 207 204 191 194   Cardiac Enzymes: No results for  input(s): CKTOTAL, CKMB, CKMBINDEX, TROPONINI in the last 168 hours. BNP: Invalid input(s): POCBNP CBG: Recent Labs  Lab 11/11/18 0744 11/11/18 0746 11/11/18 1152 11/11/18 1554 11/14/18 1637  GLUCAP 121* 119* 108* 93 79   D-Dimer No results for input(s): DDIMER in the last 72 hours. Hgb A1c No results for input(s): HGBA1C in the last 72 hours. Lipid Profile No results for input(s): CHOL, HDL, LDLCALC, TRIG, CHOLHDL, LDLDIRECT in the last 72 hours. Thyroid function studies No results for input(s): TSH, T4TOTAL, T3FREE, THYROIDAB in the last 72 hours.  Invalid input(s): FREET3 Anemia work up No results for input(s): VITAMINB12, FOLATE, FERRITIN, TIBC, IRON, RETICCTPCT in the last 72 hours. Urinalysis    Component Value Date/Time   COLORURINE YELLOW 11/08/2018 1600   APPEARANCEUR HAZY (A) 11/08/2018 1600   LABSPEC 1.036 (H) 11/08/2018 1600   PHURINE 5.0 11/08/2018 1600    GLUCOSEU >=500 (A) 11/08/2018 1600   HGBUR MODERATE (A) 11/08/2018 1600   BILIRUBINUR NEGATIVE 11/08/2018 1600   KETONESUR NEGATIVE 11/08/2018 1600   PROTEINUR 30 (A) 11/08/2018 1600   UROBILINOGEN 0.2 02/16/2015 2345   NITRITE NEGATIVE 11/08/2018 1600   LEUKOCYTESUR SMALL (A) 11/08/2018 1600   Sepsis Labs Invalid input(s): PROCALCITONIN,  WBC,  LACTICIDVEN Microbiology Recent Results (from the past 240 hour(s))  SARS Coronavirus 2 (CEPHEID- Performed in [facility] lab), Hosp Order     Status: None   Collection Time: 11/07/18 11:18 AM   Specimen: Nasopharyngeal Swab  Result Value Ref Range Status   SARS Coronavirus 2 NEGATIVE NEGATIVE Final    Comment: (NOTE) If result is NEGATIVE SARS-CoV-2 target nucleic acids are NOT DETECTED. The SARS-CoV-2 RNA is generally detectable in upper and lower  respiratory specimens during the acute phase of infection. The lowest  concentration of SARS-CoV-2 viral copies this assay can detect is 250  copies / mL. A negative result does not preclude SARS-CoV-2 infection  and should not be used as the sole basis for treatment or other  patient management decisions.  A negative result may occur with  improper specimen collection / handling, submission of specimen other  than nasopharyngeal swab, presence of viral mutation(s) within the  areas targeted by this assay, and inadequate number of viral copies  (<250 copies / mL). A negative result must be combined with clinical  observations, patient history, and epidemiological information. If result is POSITIVE SARS-CoV-2 target nucleic acids are DETECTED. The SARS-CoV-2 RNA is generally detectable in upper and lower  respiratory specimens dur ing the acute phase of infection.  Positive  results are indicative of active infection with SARS-CoV-2.  Clinical  correlation with patient history and other diagnostic information is  necessary to determine patient infection status.  Positive results do   not rule out bacterial infection or co-infection with other viruses. If result is PRESUMPTIVE POSTIVE SARS-CoV-2 nucleic acids MAY BE PRESENT.   A presumptive positive result was obtained on the submitted specimen  and confirmed on repeat testing.  While 2019 novel coronavirus  (SARS-CoV-2) nucleic acids may be present in the submitted sample  additional confirmatory testing may be necessary for epidemiological  and / or clinical management purposes  to differentiate between  SARS-CoV-2 and other Sarbecovirus currently known to infect humans.  If clinically indicated additional testing with an alternate test  methodology [phone]) is advised. The SARS-CoV-2 RNA is generally  detectable in upper and lower respiratory sp ecimens during the acute  phase of infection. The expected result is Negative. Fact Sheet for Patients:  StrictlyIdeas.no Fact Sheet for Healthcare Providers: BankingDealers.co.za This test is not yet approved or cleared by the [place] FDA and has been authorized for detection and/or diagnosis of SARS-CoV-2 by FDA under an Emergency Use Authorization (EUA).  This EUA will remain in effect (meaning this test can be used) for the duration of the COVID-19 declaration under Section 564(b)(1) of the Act, 21 U.S.C. section 360bbb-3(b)(1), unless the authorization is terminated or revoked sooner. Performed at [facility], [place] [address] [place], [place] [address]   Blood culture (routine x 2)     Status: Abnormal   Collection Time: 11/07/18 11:19 AM   Specimen: BLOOD  Result Value Ref Range Status   Specimen Description   Final    BLOOD LEFT ANTECUBITAL Performed at [facility], [place] [address] [place], [name] [address]    Special Requests   Final    BOTTLES DRAWN AEROBIC AND ANAEROBIC Blood Culture results may not be optimal due to an inadequate volume of blood received in culture  bottles Performed at [place] [address], [place], [place] [address]    Culture  Setup Time   Final    GRAM POSITIVE COCCI AEROBIC BOTTLE ONLY CRITICAL RESULT CALLED TO, READ BACK BY AND VERIFIED WITH: SWAYNE  PHARMD AT 2440 ON [id number] BY SJW    Culture (A)  Final    STAPHYLOCOCCUS SPECIES (COAGULASE NEGATIVE) THE SIGNIFICANCE OF ISOLATING THIS ORGANISM FROM A SINGLE VENIPUNCTURE CANNOT BE PREDICTED WITHOUT FURTHER CLINICAL AND CULTURE CORRELATION. SUSCEPTIBILITIES AVAILABLE ONLY ON REQUEST. Performed at [facility], [place] [address], [place], [place] [address]    Report Status 11/09/2018 FINAL  Final  Blood Culture ID Panel (Reflexed)     Status: Abnormal   Collection Time: 11/07/18 11:19 AM  Result Value Ref Range Status   Enterococcus species NOT DETECTED NOT DETECTED Final   Listeria monocytogenes NOT DETECTED NOT DETECTED Final   Staphylococcus species DETECTED (A) NOT DETECTED Final    Comment: Methicillin (oxacillin) susceptible coagulase negative staphylococcus. Possible blood culture contaminant (unless isolated from more than one blood culture draw or clinical case suggests pathogenicity). No antibiotic treatment is indicated for blood  culture contaminants. CRITICAL RESULT CALLED TO, READ BACK BY AND VERIFIED WITH: SWAYNE PHARMD AT 1114 ON [id number] BY SJW    Staphylococcus aureus (BCID) NOT DETECTED NOT DETECTED Final   Methicillin resistance NOT DETECTED NOT DETECTED Final   Streptococcus species NOT DETECTED NOT DETECTED Final   Streptococcus agalactiae NOT DETECTED NOT DETECTED Final   Streptococcus pneumoniae NOT DETECTED NOT DETECTED Final   Streptococcus pyogenes NOT DETECTED NOT DETECTED Final   Acinetobacter baumannii NOT DETECTED NOT DETECTED Final   Enterobacteriaceae species NOT DETECTED NOT DETECTED Final   Enterobacter cloacae complex NOT DETECTED NOT DETECTED Final   Escherichia coli NOT DETECTED NOT DETECTED Final   Klebsiella  oxytoca NOT DETECTED NOT DETECTED Final   Klebsiella pneumoniae NOT DETECTED NOT DETECTED Final   Proteus species NOT DETECTED NOT DETECTED Final   Serratia marcescens NOT DETECTED NOT DETECTED Final   Haemophilus influenzae NOT DETECTED NOT DETECTED Final   Neisseria meningitidis NOT DETECTED NOT DETECTED Final   Pseudomonas aeruginosa NOT DETECTED NOT DETECTED Final   Candida albicans NOT DETECTED NOT DETECTED Final   Candida glabrata NOT DETECTED NOT DETECTED Final   Candida krusei NOT DETECTED NOT DETECTED Final   Candida parapsilosis NOT DETECTED NOT DETECTED Final   Candida tropicalis NOT DETECTED NOT DETECTED  Final    Comment: Performed at [facility], [place] [address], [place], [place] [address]  MRSA PCR Screening     Status: Abnormal   Collection Time: 11/07/18 11:46 PM   Specimen: Nasal Mucosa; Nasopharyngeal  Result Value Ref Range Status   MRSA by PCR POSITIVE (A) NEGATIVE Final    Comment:        The GeneXpert MRSA Assay (FDA approved for NASAL specimens only), is one component of a comprehensive MRSA colonization surveillance program. It is not intended to diagnose MRSA infection nor to guide or monitor treatment for MRSA infections. RESULT CALLED TO, READ BACK BY AND VERIFIED WITH: LANEY,S RN @0233  ON 11/08/2018 JACKSON,K Performed at [facility] [place] [address], [place], [place] [address]      Time coordinating discharge: Over 30 minutes  SIGNED:   Eric J [place] DO  [facility] 11/16/2018, 2:39 PM

## 2018-11-16 NOTE — TOC Initial Note (Signed)
Transition of Care [facility]) - Initial/Assessment Note    Patient Details  Name: Rodney [name] MRN: [id number] Date of Birth: 04/04/1951  [facility] CM/SW Contact:    Lia Hopping, [place] Phone Number: 11/16/2018, 12:20 PM  Clinical Narrative:                 PT recommends SNF. Patient agreeable to SNF placement for short rehab. Patient reports he does not have help at home and does not feel safe going home alone. Patient is fairly independent with ADL's and uses a walker for assistance. Patient gave CSW permission to sent information to SNF's in [place] and [place] area.    Expected Discharge Plan: Skilled Nursing Facility Barriers to Discharge: No Barriers Identified   Patient Goals and CMS Choice Patient states their goals for this hospitalization and ongoing recovery are:: Rehab at facility CMS Medicare.gov Compare Post Acute Care list provided to:: Patient Choice offered to / list presented to : Adult Children  Expected Discharge Plan and Services Expected Discharge Plan: [place] arrangements for the past 2 months: Single Family Home Expected Discharge Date: 11/12/18                                    Prior Living Arrangements/Services Living arrangements for the past 2 months: Single Family Home Lives with:: Self Patient language and need for interpreter reviewed:: No Do you feel safe going back to the place where you live?: Yes      Need for Family Participation in Patient Care: Yes (Comment) Care giver support system in place?: Yes (comment)   Criminal Activity/Legal Involvement Pertinent to Current Situation/Hospitalization: No - Comment as needed  Activities of Daily Living Home Assistive Devices/Equipment: Cane (specify quad or straight), Walker (specify type) ADL Screening (condition at time of admission) Patient's cognitive ability adequate to safely complete daily activities?: Yes Is the patient deaf  or have difficulty hearing?: No Does the patient have difficulty seeing, even when wearing glasses/contacts?: No Does the patient have difficulty concentrating, remembering, or making decisions?: No Patient able to express need for assistance with ADLs?: Yes Does the patient have difficulty dressing or bathing?: No Independently performs ADLs?: Yes (appropriate for developmental age) Does the patient have difficulty walking or climbing stairs?: Yes Weakness of Legs: Both Weakness of Arms/Hands: None  Permission Sought/Granted Permission sought to share information with : Case Manager Permission granted to share information with : Yes, Verbal Permission Granted     Permission granted to share info w AGENCY: SNF  Permission granted to share info w Relationship: Brothers     Emotional Assessment Appearance:: Appears stated age   Affect (typically observed): Accepting, Pleasant Orientation: : Oriented to Self, Oriented to Place, Oriented to  Time, Oriented to Situation Alcohol / Substance Use: Not Applicable Psych Involvement: No (comment)  Admission diagnosis:  Hypocalcemia [E83.51] Hypokalemia [E87.6] Hypomagnesemia [E83.42] Bradycardia [R00.1] Bowel perforation (HCC) [K63.1] Acute respiratory failure with hypoxia (HCC) [J96.01] Mass of right lung [R91.8] Patient Active Problem List   Diagnosis Date Noted  . Pressure injury of skin 11/09/2018  . Perforated gastric ulcer s/p omental patch 11/07/2018 11/09/2018  . Alcohol abuse 11/09/2018  . Acute abdomen 11/07/2018  . Cigarette smoker 09/08/2015  . CAD in native artery with stent 09/08/2015  . Bullous pemphigoid 09/08/2015  . Fournier's gangrene of scrotum 09/01/2015  . Benign  essential HTN 09/01/2015  . 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/25/2015  . Cellulitis, scrotum 08/16/2015  . Scrotal swelling   . Cellulitis of scrotum 08/15/2015  . Hyponatremia 08/15/2015  . AKI (acute kidney injury) ([place]) 08/15/2015   . Protein calorie malnutrition ([place]) 08/15/2015  . Thrombocytopenia ([place]) 08/15/2015  . Anemia, normocytic normochromic 08/15/2015  . Scrotal abscess 08/15/2015  . Acute coronary syndrome ([place]) 08/03/2015  . Atherosclerosis of native arteries of extremity with rest pain ([place]) 02/16/2015  . PAD (peripheral artery disease) ([place]) 02/16/2015  . Ischemic leg 02/09/2015  . Dysuria 08/09/2011  . Prostate cancer ([place]) 05/03/2009   PCP:  Gaynelle Arabian, MD Pharmacy:   [id number] - [place], [place] [address] Phone: [phone] Fax: [phone]     Social Determinants of Health (SDOH) Interventions    Readmission Risk Interventions No flowsheet data found.

## 2018-11-16 NOTE — Progress Notes (Signed)
9 Days Post-Op  Subjective: CC:  Patient sitting up in bed, awake and alert. He notes that he tolerated dinner yesterday without increased abdominal pain, N/V. Nurse reports that drainage from [place] today is more clear and thin compared to Saturday when she was here. The patient did not get out of bed/walk yesterday. Passing flatus. Reports small bm yesterday.   Objective: Vital signs in last 24 hours: Temp:  [97.4 F (36.3 C)-97.9 F (36.6 C)] 97.8 F (36.6 C) (06/15 0601) Pulse Rate:  [87-97] 89 (06/15 0601) Resp:  [16-18] 16 (06/15 0601) BP: (103-128)/(70-108) 103/72 (06/15 0601) SpO2:  [98 %-100 %] 98 % (06/15 0601) Last BM Date: 11/15/18  Intake/Output from previous day: 06/14 0701 - 06/15 0700 In: 39 [P.O.:480; I.V.:10] Out: 98 [Urine:4; Drains:90; Stool:4] Intake/Output this shift: No intake/output data recorded.  PE: Gen: Awake and alert, NAD Lungs: Normal rate and effort Abd: Soft, ND, approprately tender around incisions without r/r/g or signs of peritonitis. +BS. JP drain with yellow transparent SS fluid that is slightly blood tinged. 90cc recorded/24 hours.  Msk: trace edema b/l  Lab Results:  Recent Labs    11/14/18 0257  WBC 7.3  HGB 8.7*  HCT 27.4*  PLT 194   BMET Recent Labs    11/14/18 0257  NA 134*  K 3.9  CL 111  CO2 19*  GLUCOSE 112*  BUN 11  CREATININE 1.09  CALCIUM 8.0*   PT/INR No results for input(s): LABPROT, INR in the last 72 hours. CMP     Component Value Date/Time   NA 134 (L) 11/14/2018 0257   K 3.9 11/14/2018 0257   CL 111 11/14/2018 0257   CO2 19 (L) 11/14/2018 0257   GLUCOSE 112 (H) 11/14/2018 0257   BUN 11 11/14/2018 0257   CREATININE 1.09 11/14/2018 0257   CALCIUM 8.0 (L) 11/14/2018 0257   PROT 4.9 (L) 11/08/2018 0205   ALBUMIN 2.5 (L) 11/08/2018 0205   AST 32 11/08/2018 0205   ALT 17 11/08/2018 0205   ALKPHOS 64 11/08/2018 0205   BILITOT 0.3 11/08/2018 0205   GFRNONAA >60 11/14/2018 0257   GFRAA >60  11/14/2018 0257   Lipase     Component Value Date/Time   LIPASE 17 11/07/2018 1131       Studies/Results: No results found.  Anti-infectives: Anti-infectives (From admission, onward)   Start     Dose/Rate Route Frequency Ordered Stop   11/07/18 2230  fluconazole (DIFLUCAN) IVPB 200 mg  Status:  Discontinued     200 mg 100 mL/hr over 60 Minutes Intravenous Daily at bedtime 11/07/18 2216 11/12/18 0945   11/07/18 2200  piperacillin-tazobactam (ZOSYN) IVPB 3.375 g  Status:  Discontinued     3.375 g 12.5 mL/hr over 240 Minutes Intravenous Every 8 hours 11/07/18 1541 11/14/18 1324   11/07/18 1400  piperacillin-tazobactam (ZOSYN) IVPB 3.375 g     3.375 g 100 mL/hr over 30 Minutes Intravenous  Once 11/07/18 1359 11/07/18 1517       Assessment/Plan Hx Fournier's gangrene 2017 PAD CAD s/p stent 2017 Bullous pemphigoid on chronic prednisone HTN Prostate cancer s/p prostatectomy and XRT HLD H/o tobacco abuse H/o alcohol abuse - CIWA Anemia - Hg8.7 (6/13), stable AKI - Cr 1.09 (6/13), only 4cc urine recorded overnight. AM labs. Discussed with nurse to monitor On isolation for MRSA  Perforated posterior gastric ulcer S/pEmergencyLaparoscopy diagnostic, exploratory laparotomy and [place] patch repair of perforated posterior gastric ulcer-6/6-Dr. Rosendo Gros - POD #9 - H pylori Antibody 0.49,  negative - Continue PO protonix BID - JP drain outputSS. Can remove at d/c  - UGI 6/10 with no leak - Tolerating soft diet.   FEN - Soft,  VTE - SCDs, if Hgb stable on AM labs > okay for chemical prophylaxis from surgical standpoint ID - IV Zosyn x 7 days (6/6-6/13). None Currently.   Follow up -Dr. Rosendo Gros. Will need staples out in office (POD 10-14) or prior to d/c depending on timing of d/c.   Plan: Patient is tolerating a diet. JP drain can come out prior to d/c. Will arrange follow up as above depending on timing of discharge. Per hospitalists note yesterday, discharge depending  on PT re-eval (on schedule to see today) ? [place] vs SNF   LOS: 9 days    Jillyn Ledger , [facility] 11/16/2018, 9:21 AM Pager: [phone]

## 2018-11-16 NOTE — Progress Notes (Signed)
PROGRESS NOTE    Rodney [name]  VOZ:[id number] DOB: [birth year] DOA: 11/07/2018 PCP: Gaynelle Arabian, MD   Brief Narrative:   68 year old male who presented with abdominal pain.  He does have significant past medical history of tobacco and alcohol abuse.  He reported abrupt onset of progressive abdominal pain, that prompted him to come to the hospital.  Physical examination blood pressure 124/73, heart rate 61, temperature 97.6, respiratory rate 18, oxygen saturation 98%, he was awake and alert, his lungs are clear to auscultation bilaterally, heart S1-S2 present and rhythmic, his abdomen was tender in the epigastric region, decreased bowel sounds, positive distention, no lower extremity edema. NA 131, potassium 3.0, chloride 115, BUN 11, glucose 81, BUN 17, creatinine 1.0, white count 7.6, hemoglobin 8.1, hematocrit 25.5, platelets 245, SARS COVID-19 negative.  Urine analysis 21-50 white cells, 21-50 red cells.  Alcohol level less than 10.  CT of the abdomen showed extensive free intraperitoneal air and fluid throughout the abdomen and pelvis.  Favored etiology peptic ulcer disease, possibly in the gastric antrum.  Negative for pulmonary embolism.  Right upper lobe scaring.  Chest radiograph negative for infiltrates.  EKG 48 bpm, normal axis, sinus rhythm, QTC 480, low voltage, no ST segment or T wave changes, one nonconducting PAC.  Patient was admitted to the intensive care unit working diagnosis of perforated viscus.   Assessment & Plan:   Principal Problem:   Perforated gastric ulcer s/p omental patch 11/07/2018 Active Problems:   Acute abdomen   Pressure injury of skin   Alcohol abuse   Perforated posterior gastric ulcer, s/p exploratory laparotomy and Delford Field. Patient presented to [facility] long ED with progressive abdominal discomfort.  CT abdomen/pelvis notable for extensive free intraperitoneal air and fluid.  Patient underwent exploratory laparotomy with Phillip Heal patch repair  11/07/2018 by Dr. Rosendo Gros.  H. pylori antibody was negative. --General surgery following, appreciate assistance --UGI series with no evidence of gastric leak and questionable small duodenal diverticulum. --JP drain in place, 90 mL's out past 24 hours --Continue Protonix 40 mg PO BID --Completed 7-day course of IV Zosyn on 11/14/2018 --Tolerating soft diet --Plan removal of JP drain by general surgery today --Outpatient follow-up with Dr. Rosendo Gros for staple removal 10-14 days postop  AKI Creatinine on admission 1.03, peaked at 1.57.  Now trending down. --Creatinine 1.09  --Continue encourage increased oral intake --Renally dose all medications, avoid nephrotoxins  EtOH withdrawal  ICU/hospital-acquired delirium EtOH level on admission less than 10.  --Did not require Ativan over the past 48 hours --continue to monitor on CIWAA --Ativan prn --Continue folic acid and thiamine --Encourage proper sleep hygiene  T2DM Hemoglobin A1c 5.2 on 11/12/2018.  Diet controlled and not on any home medications. --Continue to monitor CBG's   HTN Patient on metoprolol 25 mg per day at home --SBP 112 to 131 mmHg past 24 hours --Continue to monitor BP closely  Chronic ambulatory dysfunction with sacral stage 2 decubitus ulcer, present on admission --Continue local wound care, physical therapy and out of bed as tolerated. --Will need home health PT vs SNF on discharge; due for repeat PT eval  --Patient has appointment at hyperbaric/wound care center on 11/30/2018.  Multifactorial chronic anemia Iron studies remarkable for iron 15, TIBC 119, ferritin 281, transferrin 85, consistent with anemia of chronic disease; likely exacerbated by some postop anemia.  No signs of active bleeding. --Hgb 7.0-->8.7-->8.6-->8.7, stable --Transfused 1 unit pRBCs on 6/12    DVT prophylaxis: SCDs Code Status: Full code Family  Communication: none Disposition Plan: SNF, case management for  coordination   Consultants:   General surgery  Procedures:   S/pEmergencyLAPAROSCOPY DIAGNOSTIC (N/A),EXPLORATORY LAPAROTOMYand Graham patch repair of perforated posterior gastric ulceron 6/6 Dr. Rosendo Gros  Antimicrobials:   Zosyn 6/6 - 6/13  Fluconazole 6/6 - 6/11   Subjective: Patient seen and examined at bedside this morning, resting comfortably in bed.  No complaints overnight.  Tolerating soft diet.  JP drain continues to be in place with 90 mL's out past 24 hours; neurosurgery was removed today.  No other complaints at this time.  ED eval today recommends SNF, case management aware and will coordinate.  Denies headache, no fever/chills/night sweats, no chest pain, no palpitations, no shortness of breath.  No acute concerns overnight per nursing staff.   Objective: Vitals:   11/15/18 1600 11/15/18 2129 11/16/18 0015 11/16/18 0601  BP: 113/81 121/75 125/77 103/72  Pulse: 87 93 94 89  Resp:  17 18 16   Temp:  97.9 F (36.6 C) 97.8 F (36.6 C) 97.8 F (36.6 C)  TempSrc:  Oral Oral Oral  SpO2:  99% 100% 98%  Weight:      Height:        Intake/Output Summary (Last 24 hours) at 11/16/2018 1247 Last data filed at 11/16/2018 1026 Gross per 24 hour  Intake 490 ml  Output 97 ml  Net 393 ml   Filed Weights   11/13/18 0500 11/14/18 0425 11/15/18 0541  Weight: 66 kg 69.2 kg 68.7 kg    Examination:  General exam: Resting comfortably, no acute distress Respiratory system: Clear to auscultation. Respiratory effort normal. Cardiovascular system: S1 & S2 heard, RRR. No JVD, murmurs, rubs, gallops or clicks. No pedal edema. Gastrointestinal system: Abdomen is slightly distended, soft and nontender. No organomegaly or masses felt.  Surgical wound noted, C/D/I, bowel sounds present.,  JP drain noted Central nervous system: Alert and oriented x4,  no focal neurological deficits. Extremities: Symmetric 5 x 5 power. Skin: No rashes, lesions or ulcers Psychiatry: Judgement and  insight appear normal.  Slightly anxious.    Data Reviewed: I have personally reviewed following labs and imaging studies  CBC: Recent Labs  Lab 11/11/18 0440 11/12/18 0630 11/13/18 0400 11/14/18 0257  WBC 7.1 8.3 8.1 7.3  NEUTROABS 5.5  --   --   --   HGB 7.0* 8.7* 8.6* 8.7*  HCT 22.1* 26.9* 27.6* 27.4*  MCV 96.5 94.1 93.9 94.5  PLT 207 204 191 998   Basic Metabolic Panel: Recent Labs  Lab 11/11/18 0440 11/12/18 0630 11/13/18 0400 11/14/18 0257  NA 136 135 135 134*  K 4.1 3.5 3.1* 3.9  CL 111 108 108 111  CO2 18* 18* 19* 19*  GLUCOSE 128* 116* 113* 112*  BUN 13 14 14 11   CREATININE 1.33* 1.29* 1.24 1.09  CALCIUM 7.6* 8.0* 7.9* 8.0*  MG 1.5*  --  1.5* 2.2   GFR: Estimated Creatinine Clearance: 64.5 mL/min (by C-G formula based on SCr of 1.09 mg/dL). Liver Function Tests: No results for input(s): AST, ALT, ALKPHOS, BILITOT, PROT, ALBUMIN in the last 168 hours. No results for input(s): LIPASE, AMYLASE in the last 168 hours. No results for input(s): AMMONIA in the last 168 hours. Coagulation Profile: No results for input(s): INR, PROTIME in the last 168 hours. Cardiac Enzymes: No results for input(s): CKTOTAL, CKMB, CKMBINDEX, TROPONINI in the last 168 hours. BNP (last 3 results) No results for input(s): PROBNP in the last 8760 hours. HbA1C: No results for input(s):  HGBA1C in the last 72 hours. CBG: Recent Labs  Lab 11/11/18 0744 11/11/18 0746 11/11/18 1152 11/11/18 1554 11/14/18 1637  GLUCAP 121* 119* 108* 93 79   Lipid Profile: No results for input(s): CHOL, HDL, LDLCALC, TRIG, CHOLHDL, LDLDIRECT in the last 72 hours. Thyroid Function Tests: No results for input(s): TSH, T4TOTAL, FREET4, T3FREE, THYROIDAB in the last 72 hours. Anemia Panel: No results for input(s): VITAMINB12, FOLATE, FERRITIN, TIBC, IRON, RETICCTPCT in the last 72 hours. Sepsis Labs: No results for input(s): PROCALCITON, LATICACIDVEN in the last 168 hours.  Recent Results (from  the past 240 hour(s))  SARS Coronavirus 2 (CEPHEID- Performed in [place] hospital lab), Hosp Order     Status: None   Collection Time: 11/07/18 11:18 AM   Specimen: Nasopharyngeal Swab  Result Value Ref Range Status   SARS Coronavirus 2 NEGATIVE NEGATIVE Final    Comment: (NOTE) If result is NEGATIVE SARS-CoV-2 target nucleic acids are NOT DETECTED. The SARS-CoV-2 RNA is generally detectable in upper and lower  respiratory specimens during the acute phase of infection. The lowest  concentration of SARS-CoV-2 viral copies this assay can detect is 250  copies / mL. A negative result does not preclude SARS-CoV-2 infection  and should not be used as the sole basis for treatment or other  patient management decisions.  A negative result may occur with  improper specimen collection / handling, submission of specimen other  than nasopharyngeal swab, presence of viral mutation(s) within the  areas targeted by this assay, and inadequate number of viral copies  (<250 copies / mL). A negative result must be combined with clinical  observations, patient history, and epidemiological information. If result is POSITIVE SARS-CoV-2 target nucleic acids are DETECTED. The SARS-CoV-2 RNA is generally detectable in upper and lower  respiratory specimens dur ing the acute phase of infection.  Positive  results are indicative of active infection with SARS-CoV-2.  Clinical  correlation with patient history and other diagnostic information is  necessary to determine patient infection status.  Positive results do  not rule out bacterial infection or co-infection with other viruses. If result is PRESUMPTIVE POSTIVE SARS-CoV-2 nucleic acids MAY BE PRESENT.   A presumptive positive result was obtained on the submitted specimen  and confirmed on repeat testing.  While 2019 novel coronavirus  (SARS-CoV-2) nucleic acids may be present in the submitted sample  additional confirmatory testing may be necessary  for epidemiological  and / or clinical management purposes  to differentiate between  SARS-CoV-2 and other Sarbecovirus currently known to infect humans.  If clinically indicated additional testing with an alternate test  methodology [phone]) is advised. The SARS-CoV-2 RNA is generally  detectable in upper and lower respiratory sp ecimens during the acute  phase of infection. The expected result is Negative. Fact Sheet for Patients:  StrictlyIdeas.no Fact Sheet for Healthcare Providers: BankingDealers.co.za This test is not yet approved or cleared by the [place] FDA and has been authorized for detection and/or diagnosis of SARS-CoV-2 by FDA under an Emergency Use Authorization (EUA).  This EUA will remain in effect (meaning this test can be used) for the duration of the COVID-19 declaration under Section 564(b)(1) of the Act, 21 U.S.C. section 360bbb-3(b)(1), unless the authorization is terminated or revoked sooner. Performed at [facility], [place] [address] [place], [place] [address]   Blood culture (routine x 2)     Status: Abnormal   Collection Time: 11/07/18 11:19 AM   Specimen: BLOOD  Result Value Ref  Range Status   Specimen Description   Final    BLOOD LEFT ANTECUBITAL Performed at [facility], [place] [address] [place], [place] [address]    Special Requests   Final    BOTTLES DRAWN AEROBIC AND ANAEROBIC Blood Culture results may not be optimal due to an inadequate volume of blood received in culture bottles Performed at [facility] [address], [place], [place] [address]    Culture  Setup Time   Final    GRAM POSITIVE COCCI AEROBIC BOTTLE ONLY CRITICAL RESULT CALLED TO, READ BACK BY AND VERIFIED WITH: SWAYNE  PHARMD AT 3825 ON [id number] BY SJW    Culture (A)  Final    STAPHYLOCOCCUS SPECIES (COAGULASE NEGATIVE) THE SIGNIFICANCE OF ISOLATING THIS ORGANISM FROM A SINGLE  VENIPUNCTURE CANNOT BE PREDICTED WITHOUT FURTHER CLINICAL AND CULTURE CORRELATION. SUSCEPTIBILITIES AVAILABLE ONLY ON REQUEST. Performed at [facility], [place] [address] [place], [place] [address]    Report Status 11/09/2018 FINAL  Final  Blood Culture ID Panel (Reflexed)     Status: Abnormal   Collection Time: 11/07/18 11:19 AM  Result Value Ref Range Status   Enterococcus species NOT DETECTED NOT DETECTED Final   Listeria monocytogenes NOT DETECTED NOT DETECTED Final   Staphylococcus species DETECTED (A) NOT DETECTED Final    Comment: Methicillin (oxacillin) susceptible coagulase negative staphylococcus. Possible blood culture contaminant (unless isolated from more than one blood culture draw or clinical case suggests pathogenicity). No antibiotic treatment is indicated for blood  culture contaminants. CRITICAL RESULT CALLED TO, READ BACK BY AND VERIFIED WITH: SWAYNE PHARMD AT 1114 ON [id number] BY SJW    Staphylococcus aureus (BCID) NOT DETECTED NOT DETECTED Final   Methicillin resistance NOT DETECTED NOT DETECTED Final   Streptococcus species NOT DETECTED NOT DETECTED Final   Streptococcus agalactiae NOT DETECTED NOT DETECTED Final   Streptococcus pneumoniae NOT DETECTED NOT DETECTED Final   Streptococcus pyogenes NOT DETECTED NOT DETECTED Final   Acinetobacter baumannii NOT DETECTED NOT DETECTED Final   Enterobacteriaceae species NOT DETECTED NOT DETECTED Final   Enterobacter cloacae complex NOT DETECTED NOT DETECTED Final   Escherichia coli NOT DETECTED NOT DETECTED Final   Klebsiella oxytoca NOT DETECTED NOT DETECTED Final   Klebsiella pneumoniae NOT DETECTED NOT DETECTED Final   Proteus species NOT DETECTED NOT DETECTED Final   Serratia marcescens NOT DETECTED NOT DETECTED Final   Haemophilus influenzae NOT DETECTED NOT DETECTED Final   Neisseria meningitidis NOT DETECTED NOT DETECTED Final   Pseudomonas aeruginosa NOT DETECTED NOT DETECTED Final   Candida albicans NOT  DETECTED NOT DETECTED Final   Candida glabrata NOT DETECTED NOT DETECTED Final   Candida krusei NOT DETECTED NOT DETECTED Final   Candida parapsilosis NOT DETECTED NOT DETECTED Final   Candida tropicalis NOT DETECTED NOT DETECTED Final    Comment: Performed at [facility], [address], [place], [place] [address]  MRSA PCR Screening     Status: Abnormal   Collection Time: 11/07/18 11:46 PM   Specimen: Nasal Mucosa; Nasopharyngeal  Result Value Ref Range Status   MRSA by PCR POSITIVE (A) NEGATIVE Final    Comment:        The GeneXpert MRSA Assay (FDA approved for NASAL specimens only), is one component of a comprehensive MRSA colonization surveillance program. It is not intended to diagnose MRSA infection nor to guide or monitor treatment for MRSA infections. RESULT CALLED TO, READ BACK BY AND VERIFIED WITH: LANEY,S RN @0233  ON  11/08/2018 JACKSON,K Performed at [facility], [place] [address] [place], [place] [address]          Radiology Studies: No results found.      Scheduled Meds:  Chlorhexidine Gluconate Cloth  6 each Topical Daily   feeding supplement  1 Container Oral TID BM   folic acid  1 mg Oral Daily   latanoprost  1 drop Both Eyes QHS   pantoprazole  40 mg Oral BID   sodium chloride flush  10-40 mL Intracatheter Q12H   thiamine  100 mg Oral Daily   Continuous Infusions:  sodium chloride 250 mL (11/15/18 1322)     LOS: 9 days    Time spent: 28 minutes    Britlyn Martine J [place] DO [facility] Pager [phone]  If 7PM-7AM, please contact night-coverage [email or web address] Password [facility] 11/16/2018, 12:47 PM

## 2018-11-16 NOTE — Progress Notes (Signed)
Patient discharged to [facility]. He was transferred there by [facility], his information packet was given to Ptar to give to [facility]. I called [facility] 3 times to give report, no one would pick up on the unit the patient was going to.

## 2018-11-16 NOTE — Care Management Important Message (Signed)
Important Message  Patient Details IM Letter given to [name] SW to present to the Patient Name: Rodney [name] MRN: [id number] Date of Birth: [birth year]   Medicare Important Message Given:  Yes    Kerin Salen 11/16/2018, 12:01 PM

## 2018-11-30 ENCOUNTER — Encounter (HOSPITAL_BASED_OUTPATIENT_CLINIC_OR_DEPARTMENT_OTHER): Payer: Medicare Other | Attending: Internal Medicine

## 2019-01-01 ENCOUNTER — Emergency Department (HOSPITAL_COMMUNITY)
Admission: EM | Admit: 2019-01-01 | Discharge: 2019-01-01 | Disposition: A | Discharge status: Other Acute Inpatient Hospital | Payer: Medicare Other | Attending: Emergency Medicine | Admitting: Emergency Medicine

## 2019-01-01 ENCOUNTER — Other Ambulatory Visit: Payer: Self-pay

## 2019-01-01 ENCOUNTER — Encounter (HOSPITAL_COMMUNITY): Payer: Self-pay

## 2019-01-01 ENCOUNTER — Emergency Department (HOSPITAL_COMMUNITY): Payer: Medicare Other

## 2019-01-01 DIAGNOSIS — L120 Bullous pemphigoid: Secondary | ICD-10-CM | POA: Diagnosis not present

## 2019-01-01 DIAGNOSIS — I10 Essential (primary) hypertension: Secondary | ICD-10-CM | POA: Diagnosis not present

## 2019-01-01 DIAGNOSIS — Z20828 Contact with and (suspected) exposure to other viral communicable diseases: Secondary | ICD-10-CM | POA: Insufficient documentation

## 2019-01-01 DIAGNOSIS — Z7982 Long term (current) use of aspirin: Secondary | ICD-10-CM | POA: Diagnosis not present

## 2019-01-01 DIAGNOSIS — R21 Rash and other nonspecific skin eruption: Secondary | ICD-10-CM | POA: Diagnosis present

## 2019-01-01 DIAGNOSIS — L0390 Cellulitis, unspecified: Secondary | ICD-10-CM | POA: Diagnosis not present

## 2019-01-01 DIAGNOSIS — N179 Acute kidney failure, unspecified: Secondary | ICD-10-CM | POA: Diagnosis not present

## 2019-01-01 DIAGNOSIS — I2510 Atherosclerotic heart disease of native coronary artery without angina pectoris: Secondary | ICD-10-CM | POA: Insufficient documentation

## 2019-01-01 DIAGNOSIS — Z87891 Personal history of nicotine dependence: Secondary | ICD-10-CM | POA: Insufficient documentation

## 2019-01-01 DIAGNOSIS — E860 Dehydration: Secondary | ICD-10-CM | POA: Insufficient documentation

## 2019-01-01 DIAGNOSIS — F10221 Alcohol dependence with intoxication delirium: Secondary | ICD-10-CM | POA: Diagnosis present

## 2019-01-01 DIAGNOSIS — E46 Unspecified protein-calorie malnutrition: Secondary | ICD-10-CM | POA: Diagnosis present

## 2019-01-01 DIAGNOSIS — Z79899 Other long term (current) drug therapy: Secondary | ICD-10-CM | POA: Diagnosis not present

## 2019-01-01 DIAGNOSIS — R531 Weakness: Secondary | ICD-10-CM | POA: Diagnosis not present

## 2019-01-01 DIAGNOSIS — I739 Peripheral vascular disease, unspecified: Secondary | ICD-10-CM | POA: Diagnosis not present

## 2019-01-01 DIAGNOSIS — Z8546 Personal history of malignant neoplasm of prostate: Secondary | ICD-10-CM | POA: Insufficient documentation

## 2019-01-01 LAB — CBC WITH DIFFERENTIAL/PLATELET
Abs Immature Granulocytes: 0.2 10*3/uL — ABNORMAL HIGH (ref 0.00–0.07)
Band Neutrophils: 28 %
Basophils Absolute: 0 10*3/uL (ref 0.0–0.1)
Basophils Relative: 0 %
Eosinophils Absolute: 0.2 10*3/uL (ref 0.0–0.5)
Eosinophils Relative: 2 %
HCT: 37.5 % — ABNORMAL LOW (ref 39.0–52.0)
Hemoglobin: 12.1 g/dL — ABNORMAL LOW (ref 13.0–17.0)
Lymphocytes Relative: 7 %
Lymphs Abs: 0.8 10*3/uL (ref 0.7–4.0)
MCH: 28.1 pg (ref 26.0–34.0)
MCHC: 32.3 g/dL (ref 30.0–36.0)
MCV: 87.2 fL (ref 80.0–100.0)
Metamyelocytes Relative: 2 %
Monocytes Absolute: 0.6 10*3/uL (ref 0.1–1.0)
Monocytes Relative: 5 %
Neutro Abs: 10.1 10*3/uL — ABNORMAL HIGH (ref 1.7–7.7)
Neutrophils Relative %: 56 %
Platelets: 533 10*3/uL — ABNORMAL HIGH (ref 150–400)
RBC: 4.3 MIL/uL (ref 4.22–5.81)
RDW: 16.9 % — ABNORMAL HIGH (ref 11.5–15.5)
WBC Morphology: INCREASED
WBC: 12 10*3/uL — ABNORMAL HIGH (ref 4.0–10.5)
nRBC: 0 % (ref 0.0–0.2)

## 2019-01-01 LAB — COMPREHENSIVE METABOLIC PANEL
ALT: 20 U/L (ref 0–44)
AST: 36 U/L (ref 15–41)
Albumin: 1.4 g/dL — ABNORMAL LOW (ref 3.5–5.0)
Alkaline Phosphatase: 108 U/L (ref 38–126)
Anion gap: 16 — ABNORMAL HIGH (ref 5–15)
BUN: 32 mg/dL — ABNORMAL HIGH (ref 8–23)
CO2: 23 mmol/L (ref 22–32)
Calcium: 7.9 mg/dL — ABNORMAL LOW (ref 8.9–10.3)
Chloride: 98 mmol/L (ref 98–111)
Creatinine, Ser: 1.86 mg/dL — ABNORMAL HIGH (ref 0.61–1.24)
GFR calc Af Amer: 43 mL/min — ABNORMAL LOW (ref >60)
GFR calc non Af Amer: 37 mL/min — ABNORMAL LOW (ref >60)
Glucose, Bld: 87 mg/dL (ref 70–99)
Potassium: 4.7 mmol/L (ref 3.5–5.1)
Sodium: 137 mmol/L (ref 135–145)
Total Bilirubin: 1.2 mg/dL (ref 0.3–1.2)
Total Protein: 5.1 g/dL — ABNORMAL LOW (ref 6.5–8.1)

## 2019-01-01 LAB — PROTIME-INR
INR: 1.2 (ref 0.8–1.2)
Prothrombin Time: 15.4 seconds — ABNORMAL HIGH (ref 11.4–15.2)

## 2019-01-01 LAB — LACTIC ACID, PLASMA: Lactic Acid, Venous: 2.8 mmol/L (ref 0.5–1.9)

## 2019-01-01 LAB — APTT: aPTT: 28 seconds (ref 24–36)

## 2019-01-01 LAB — ETHANOL: Alcohol, Ethyl (B): <10 mg/dL (ref <10)

## 2019-01-01 LAB — SARS CORONAVIRUS 2 BY RT PCR (HOSPITAL ORDER, PERFORMED IN ~~LOC~~ HOSPITAL LAB): SARS Coronavirus 2: NEGATIVE

## 2019-01-01 LAB — PATHOLOGIST SMEAR REVIEW

## 2019-01-01 MED ORDER — THIAMINE HCL 100 MG/ML IJ SOLN
100.0000 mg | Freq: Once | INTRAMUSCULAR | Status: AC
  Administered 2019-01-01: 100 mg via INTRAVENOUS
  Filled 2019-01-01: qty 2

## 2019-01-01 MED ORDER — SODIUM CHLORIDE 0.9 % IV BOLUS (SEPSIS)
1000.0000 mL | Freq: Once | INTRAVENOUS | Status: AC
  Administered 2019-01-01: 10:00:00 1000 mL via INTRAVENOUS

## 2019-01-01 MED ORDER — SODIUM CHLORIDE 0.9 % IV BOLUS (SEPSIS)
1000.0000 mL | Freq: Once | INTRAVENOUS | Status: AC
  Administered 2019-01-01: 1000 mL via INTRAVENOUS

## 2019-01-01 MED ORDER — SODIUM CHLORIDE 0.9 % IV SOLN
2.0000 g | INTRAVENOUS | Status: DC
  Administered 2019-01-01: 2 g via INTRAVENOUS
  Filled 2019-01-01: qty 20

## 2019-01-01 MED ORDER — SODIUM CHLORIDE 0.9 % IV BOLUS (SEPSIS)
250.0000 mL | Freq: Once | INTRAVENOUS | Status: AC
  Administered 2019-01-01: 250 mL via INTRAVENOUS

## 2019-01-01 NOTE — ED Notes (Signed)
[facility] called @ [facility], RN called by Levada Dy. (Dispatcher stated maybe after 7pm for pickup).

## 2019-01-01 NOTE — ED Notes (Signed)
Pure wick placed.

## 2019-01-01 NOTE — Consult Note (Signed)
Consult Note   Rodney [name] XKG:[id number] DOB: 06-25-1950 DOA: 01/01/2019  PCP: Gaynelle Arabian, MD Consultants:  Rosendo Gros- surgery; Bricelyn - urology Patient coming from:  Home - lives alone; NOK: Rodney [name] [place], [place]  Chief Complaint:  Fever and skin blisters  HPI: Rodney [name] is a 68 y.o. male with medical history significant of prostate CA (s/p prostatectomy and radiation 2010); CAD (stent in March 2017); PAD (s/p L femoral endarterectomy); bullous pemphigoid (previously on 5-10 mg prednisone daily); Fournier's gangrene (2017); HTN; and ETOH dependence presenting with fevers and skin blisters.  He reports intermittent confusion.  When asked why he is here, he reports "You are asking the wrong person."  He reports having been home for 2-3 months and is uncertain how long he was in SNF.  He reports that he came to the ER today because "I just couldn't move."  This was today only, but upon further questioning he has had mobility issues for the last 2 years.  He reports that today "my leg muscles disappeared.  I couldn't stand up."  He reports having had his current rash for 2-3 years.  He was hospitalized from 6/6-15 with a perforated gastric ulcer s/p Phillip Heal patch repair, complicated by ETOH withdrawal.    I called and spoke with his brother.  He went through rehab and was able to come home, but he quit eating and is malnourished.  He has been refusing to come to the hospital.  He will just lay around and drink beer, "not near as much because he just wouldn't be able to get to the refrigerator."  He his highly allergic to metal and polyester and so needs 100% cotton sheets.  The rash started when he came from rehab about 1 month ago.  Unsure if he had prior.  Unsure who he was seeing for derm - had an appointment today with a dermatologist in [place].    ED Course:  Weakness.  Likely dehydrated, possible cellulitis.  Significant diffuse skin rash.  Has a diagnosis of bullous  pemphigoid.  No active blisters, wounds appear to be healing.  Dr. Tomi Bamberger does not think these are active lesions and is not certain he needs a burn unit.    Review of Systems: As per HPI; otherwise review of systems reviewed and negative.     Past Medical History:  Diagnosis Date   AKI (acute kidney injury) ([place])    Alcohol dependence ([place])    Benign essential HTN 09/01/2015   Bullous pemphigoid    CAD (coronary artery disease)    Cataract    left eye surgery/    Cellulitis of scrotum    Heart murmur    history   Hyponatremia    Peripheral vascular disease (HCC)    Prostate cancer ([place]) 05/03/2009   Prostatectomy/Adenocrcinoma   Radiation 07/02/11-08/22/11   Prostate fossa 68.4 gray 38 fractions   Severe malnutrition (HCC)    Thrombocytopenia ([place])     Past Surgical History:  Procedure Laterality Date   APPLICATION OF A-CELL OF EXTREMITY N/A 09/25/2015   Procedure: A CELL ;  Surgeon: Loel Lofty Dillingham, DO;  Location: [place];  Service: Plastics;  Laterality: N/A;   APPLICATION OF A-CELL OF EXTREMITY N/A 10/05/2015   Procedure: APPLICATION OF A-CELL TO SCROTAL, ABDOMINAL  AND PERINEAL WOUND;  Surgeon: Loel Lofty Dillingham, DO;  Location: [place];  Service: Plastics;  Laterality: N/A;   CARDIAC CATHETERIZATION N/A 08/04/2015   Procedure: Left Heart Cath and Coronary Angiography;  Surgeon: Charolette Forward, MD;  Location: [facility];  Service: Cardiovascular;  Laterality: N/A;   ENDARTERECTOMY FEMORAL Left 02/17/2015   Procedure: LEFT FEMORAl ENDARTERECTOMY ;  Surgeon: Angelia Mould, MD;  Location: [facility];  Service: Vascular;  Laterality: Left;   HEMORRHOID SURGERY     INCISION AND DRAINAGE OF WOUND N/A 09/14/2015   Procedure: IRRIGATION AND DEBRIDEMENT SCROTAL WOUND WITH PLACEMENT OF ACELL;  Surgeon: Loel Lofty Dillingham, DO;  Location: [facility];  Service: Plastics;  Laterality: N/A;  Including Groin   INCISION AND DRAINAGE OF WOUND N/A 09/25/2015   Procedure:  IRRIGATION AND DEBRIDEMENT OF SCROTUM  WOUND;  Surgeon: Loel Lofty Dillingham, DO;  Location: [facility];  Service: Plastics;  Laterality: N/A;   INCISION AND DRAINAGE OF WOUND N/A 10/05/2015   Procedure: IRRIGATION AND DEBRIDEMENT OF SCROTAL, ABDOMINAL  AND PERINEAL WOUND;  Surgeon: Loel Lofty Dillingham, DO;  Location: [facility];  Service: Plastics;  Laterality: N/A;   IRRIGATION AND DEBRIDEMENT ABSCESS N/A 08/31/2015   Procedure: IRRIGATION AND DEBRIDEMENT SCROTAL  ABSCESS;  Surgeon: Kathie Rhodes, MD;  [facility];  Service: Urology;  Laterality: N/A;   IRRIGATION AND DEBRIDEMENT ABSCESS N/A 08/31/2015   Procedure: IRRIGATION AND DEBRIDEMENT ABSCESS;  Surgeon: Rolm Bookbinder, MD;  Location: [facility];  Service: General;  Laterality: N/A;   IRRIGATION AND DEBRIDEMENT ABSCESS N/A 09/02/2015   Procedure: IRRIGATION AND DEBRIDEMENT OF SCROTUM, ABDOMEN AND PERINEUM  WITH DRESSING CHANGE;  Surgeon: Nickie Retort, MD;  [facility];  Service: Urology;  Laterality: N/A;   IRRIGATION AND DEBRIDEMENT ABSCESS N/A 09/04/2015   Procedure: IRRIGATION AND DEBRIDEMENT and dressing change ofABSCESS;  Surgeon: Irine Seal, MD;  Location[facility];  Service: Urology;  Laterality: N/A;  to penis and scrotum   LAPAROSCOPY N/A 11/07/2018   Procedure: LAPAROSCOPY DIAGNOSTIC;  Surgeon: Ralene Ok, MD;  Location: [facility];  Service: General;  Laterality: N/A;   LAPAROTOMY N/A 11/07/2018   Procedure: EXPLORATORY LAPAROTOMY gram patch gastric ulcer;  Surgeon: Ralene Ok, MD;  [facility];  Service: General;  Laterality: N/A;   left eye surgery     LEFT HEART CATHETERIZATION WITH CORONARY ANGIOGRAM N/A 02/25/2012   Procedure: LEFT HEART CATHETERIZATION WITH CORONARY ANGIOGRAM;  Surgeon: Clent Demark, MD;  Location: [facility];  Service: Cardiovascular;  Laterality: N/A;   PATCH ANGIOPLASTY Left 02/17/2015   Procedure: VEIN PATCH ANGIOPLASTY;  Surgeon: Angelia Mould, MD;  Location: [facility];  Service:  Vascular;  Laterality: Left;   PERIPHERAL VASCULAR CATHETERIZATION N/A 02/10/2015   Procedure: Abdominal Aortogram;  Surgeon: Elam Dutch, MD;  Location: [facility];  Service: Cardiovascular;  Laterality: N/A;   PERIPHERAL VASCULAR CATHETERIZATION Bilateral 02/10/2015   Procedure: Lower Extremity Angiography;  Surgeon: Elam Dutch, MD;  Location: [facility];  Service: Cardiovascular;  Laterality: Bilateral;   ROBOT ASSISTED LAPAROSCOPIC RADICAL PROSTATECTOMY  05/03/2009   WRIST SURGERY     left    Social History   Socioeconomic History   Marital status: Widowed    Spouse name: Not on file   Number of children: Not on file   Years of education: Not on file   Highest education level: Not on file  Occupational History   Not on file  Social Needs   Financial resource strain: Not on file   Food insecurity    Worry: Not on file    Inability: Not on file   Transportation needs    Medical:  Not on file    Non-medical: Not on file  Tobacco Use   Smoking status: Former Smoker    Packs/day: 2.00    Years: 36.00    Pack years: 72.00    Types: Cigarettes    Quit date: 12/08/2014    Years since quitting: 4.0   Smokeless tobacco: Never Used  Substance and Sexual Activity   Alcohol use: Yes    Alcohol/week: 6.0 standard drinks    Types: 6 Cans of beer per week    Comment: 6-7  cans drinks daily 12 oz   Drug use: No   Sexual activity: Yes    Comment: male - wife post menopausal  Lifestyle   Physical activity    Days per week: Not on file    Minutes per session: Not on file   Stress: Not on file  Relationships   Social connections    Talks on phone: Not on file    Gets together: Not on file    Attends religious service: Not on file    Active member of club or organization: Not on file    Attends meetings of clubs or organizations: Not on file    Relationship status: Not on file   Intimate partner violence    Fear of current or ex  partner: Not on file    Emotionally abused: Not on file    Physically abused: Not on file    Forced sexual activity: Not on file  Other Topics Concern   Not on file  Social History Narrative   Not on file    Allergies  Allergen Reactions   Other Rash    polyester and metals except titanium.    Family History  Problem Relation Age of Onset   Uterine cancer Mother 52       going to [facility]   Breast cancer Sister     Prior to Admission medications   Medication Sig Start Date End Date Taking? Authorizing Provider  feeding supplement (BOOST HIGH PROTEIN) LIQD Take 1 Container by mouth 2 (two) times daily between meals.   Yes [provider]  folic acid (FOLVITE) 1 MG tablet Take 1 mg by mouth daily. 12/17/18  Yes [provider]  furosemide (LASIX) 20 MG tablet Take 20 mg by mouth every other day. 12/25/18  Yes [provider]  latanoprost (XALATAN) 0.005 % ophthalmic solution Place 1 drop into both eyes at bedtime.   Yes [provider]  metoprolol succinate (TOPROL-XL) 25 MG 24 hr tablet Take 1 tablet (25 mg total) by mouth daily. Patient taking differently: Take 25 mg by mouth 2 (two) times a day.  09/08/15  Yes Debbe Odea, MD  nitroGLYCERIN (NITROSTAT) 0.4 MG SL tablet Place 1 tablet (0.4 mg total) under the tongue every 5 (five) minutes x 3 doses as needed for chest pain. 02/27/12  Yes Charolette Forward, MD  aspirin EC 81 MG EC tablet Take 1 tablet (81 mg total) by mouth daily. Patient not taking: Reported on 11/07/2018 02/27/12   Charolette Forward, MD  atorvastatin (LIPITOR) 80 MG tablet Take 1 tablet (80 mg total) by mouth daily at 6 PM. Patient not taking: Reported on 11/07/2018 02/27/12   Charolette Forward, MD  pantoprazole (PROTONIX) 40 MG tablet Take 1 tablet (40 mg total) by mouth 2 (two) times daily for 30 days. 11/16/18 12/16/18  [place] Eric J, DO    Physical Exam: Vitals:   01/01/19 1100 01/01/19 1115 01/01/19 1130 01/01/19 1145  BP: Marland Kitchen)  143/82 128/76 122/73 (!) 132/95  Pulse: (!) 120  (!) 116 (!) 117  Resp: (!) 22 (!) 24 16 (!) 23  Temp:      TempSrc:      SpO2: 100%  100% 100%      General:  Appears disheveled and chronically ill  Eyes:  PERRL, EOMI, normal lids, iris  ENT:  grossly normal hearing, lips & tongue, mmm  Neck:  no LAD, masses or thyromegaly  Cardiovascular:  RR with tachycardia, no m/r/g.   Respiratory:   CTA bilaterally with no wheezes/rales/rhonchi.  Normal respiratory effort.  Abdomen:  soft, NT, ND, NABS  Skin:  Diffuse bullae in various stages of healing, with sloughing  Musculoskeletal:  grossly normal tone BUE/BLE, good ROM, no bony abnormality  Psychiatric:  Mildly confused mood and affect, speech fluent and appropriate, AOx 2  Neurologic:  CN 2-12 grossly intact, moves all extremities in coordinated fashion    Radiological Exams on Admission: Dg Chest Port 1 View  Result Date: 01/01/2019 CLINICAL DATA:  Weakness. Skin blisters for 2 weeks. History of prostate cancer. EXAM: PORTABLE CHEST 1 VIEW COMPARISON:  Radiographs 11/07/2018 and 09/04/2015. FINDINGS: 0902 hours. The heart size and mediastinal contours are stable with mild aortic atherosclerosis. There is stable calcified pleural thickening at both lung apices. No edema, confluent airspace opacity, pleural effusion or pneumothorax. The bones appear unchanged. There is an old ununited fracture of the distal left clavicle. IMPRESSION: No active cardiopulmonary process. Electronically Signed   By: Richardean Sale M.D.   On: 01/01/2019 09:52    EKG: Independently reviewed.  Sinus tachycardia with rate 114; nonspecific ST changes with no evidence of acute ischemia   Labs on Admission: I have personally reviewed the available labs and imaging studies at the time of the admission.  Pertinent labs:   BUN 32/Creatinine 1.86/GFR 37; 11/1.09/>60 on 6/13 Anion gap 16 Albumin 1.4 Lactate 2.8 WBC 12.0 Platelets 533 INR 1.2 ETOH  <10 Blood cultures pending COVID negative today and on 6/6  Assessment/Plan Principal Problem:   Bullous pemphigoid Active Problems:   AKI (acute kidney injury) ([place])   Protein calorie malnutrition (HCC)   Benign essential HTN   Alcohol dependence with delirium (HCC)    Bullous pemphigoid -Looks to be a Bullous Pemphigoid flare, needs dermatology evaluation.   -I have discussed with dermatology at [facility], Dr. Louretta Parma; while this can usually be handled as an outpatient, if he needs inpatient treatment then they will be happy to consult while he is there. -It appears that his steroid treatment may be fallen off his list between his prior hospitalization and SNF rehab stay and now, leading to the current flare -His weakness may be associated with this issue, and/or his severe malnutrition and persistent ETOH dependence -Regardless, since he needs hospitalization, he is likely to benefit from transfer to a facility with dermatology available for consultation and so will decline admission at this time -He does appear to have evidence of infection with elevated WBC count, elevated platelets, and elevated lactate; will defer to accepting facility regarding need for further antibiotics but has been started on Rocephin q24h for now -Blood cultures are pending -If unable to get a bed, recommend Prednisone 40 mg daily -Silvadene cream to all open areas, once-twice a day and cover with Xeroform gauze -Probably does not need placement in the burn unit, but SDU likely would be appropriate - nursing-intensive patient; burn gowns, sheets, pads would help -Monitor for s/sx of infection, good wound  care   AKI -As noted above, likely a combination of malnutrition and insensical losses related to skin sloughing -Will hydrate and trend  Malnutrition -His albumin is 1.4, likely exacerbating his poor wound healing -Continue Boost  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 Dementia related to chronic ETOH  HTN -Continue Toprol XL  CAD/PVD -Continue ASA, Lipitor   Note: This patient has been tested and is negative for the novel coronavirus COVID-19.  DVT prophylaxis:  Heparin Code Status: DNR - confirmed with family Family Communication: None present; I spoke with his brother by telephone  Disposition Plan:  Home once clinically improved Consults called: Dermatology at [facility]  Admission status: Transfer to [facility], accepting physician is Dr. Mindi Junker at [facility]   How to contact the [facility] Attending or Consulting provider [place] or covering provider during after hours [place], for this patient?  1. Check the care team in [facility] and look for a) attending/consulting [facility] provider listed and b) the [facility] team listed 2. Log into [email or web address] and use [place]'s universal password to access. If you do not have the password, please contact the hospital operator. 3. Locate the [facility] provider you are looking for under [facility] and page to a number that you can be directly reached. 4. If you still have difficulty reaching the provider, please page the [facility] (Director on Call) for the Hospitalists listed on amion for assistance.   01/01/2019, 1:49 PM

## 2019-01-01 NOTE — ED Notes (Signed)
The pt is still waiting for the [facility] transport team  No word from them

## 2019-01-01 NOTE — ED Provider Notes (Addendum)
[facility] EMERGENCY DEPARTMENT Provider Note   CSN: [id number] Arrival date & time: 01/01/19  2831    History   Chief Complaint Chief Complaint  Patient presents with  . Cellulitis    HPI Rodney [name] is a 68 y.o. male.     HPI Patient presents to the emergency room for evaluation of weakness and a rash.  Patient was admitted to the hospital back in June.  At that time he was admitted for abdominal pain and was found to have a perforated gastric ulcer.  Patient was discharged on June 15 after treatment to a nursing facility for recovery.  Patient states at some point after his hospitalization he developed this diffuse skin rash.  Patient is not exactly sure how long it has gone on for.  According to the medical records the patient has a diagnosis of bullous pemphigoid.  Patient states he has been weak since his hospitalization but has been able to get around on his own.  He has family members that check on him and then this morning his niece went to check on him.  Patient states he was unable to get up off the couch.  He felt too weak.  This concerned her so she called EMS.  Patient denies any known fevers.  He denies any chest pain or shortness of breath.  No abdominal pain.  He denies any vomiting.  He states he has had only a couple of loose stools.  He denies any falls or injuries. Past Medical History:  Diagnosis Date  . AKI (acute kidney injury) ([place])   . Alcohol abuse 11/09/2018  . Alcohol dependence ([place])   . Benign essential HTN 09/01/2015  . Bullous pemphigoid   . CAD (coronary artery disease)   . Cataract    left eye surgery/   . Cellulitis of scrotum   . Heart murmur    history  . Hyponatremia   . Peripheral vascular disease ([place])   . Prostate cancer ([place]) 05/03/2009   Prostatectomy/Adenocrcinoma  . Radiation 07/02/11-08/22/11   Prostate fossa 68.4 gray 38 fractions  . Severe malnutrition ([place])   . Thrombocytopenia [facility])     Patient Active  Problem List   Diagnosis Date Noted  . Pressure injury of skin 11/09/2018  . Perforated gastric ulcer s/p omental patch 11/07/2018 11/09/2018  . Alcohol abuse 11/09/2018  . Acute abdomen 11/07/2018  . Cigarette smoker 09/08/2015  . CAD in native artery with stent 09/08/2015  . Bullous pemphigoid 09/08/2015  . Fournier's gangrene of scrotum 09/01/2015  . Benign essential HTN 09/01/2015  . 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/25/2015  . Cellulitis, scrotum 08/16/2015  . Scrotal swelling   . Cellulitis of scrotum 08/15/2015  . Hyponatremia 08/15/2015  . AKI (acute kidney injury) ([place]) 08/15/2015  . Protein calorie malnutrition ([place]) 08/15/2015  . Thrombocytopenia ([place]) 08/15/2015  . Anemia, normocytic normochromic 08/15/2015  . Scrotal abscess 08/15/2015  . Acute coronary syndrome ([place]) 08/03/2015  . Atherosclerosis of native arteries of extremity with rest pain ([place]) 02/16/2015  . PAD (peripheral artery disease) ([place]) 02/16/2015  . Ischemic leg 02/09/2015  . Dysuria 08/09/2011  . Prostate cancer ([place]) 05/03/2009    Past Surgical History:  Procedure Laterality Date  . APPLICATION OF A-CELL OF EXTREMITY N/A 09/25/2015   Procedure: A CELL ;  Surgeon: Loel Lofty Dillingham, DO;  Location: [place];  Service: Plastics;  Laterality: N/A;  . APPLICATION OF A-CELL OF EXTREMITY N/A 10/05/2015   Procedure: APPLICATION  OF A-CELL TO SCROTAL, ABDOMINAL  AND PERINEAL WOUND;  Surgeon: Loel Lofty Dillingham, DO;  Location: [place];  Service: Plastics;  Laterality: N/A;  . CARDIAC CATHETERIZATION N/A 08/04/2015   Procedure: Left Heart Cath and Coronary Angiography;  Surgeon: Charolette Forward, MD;  Location: [place] [facility];  Service: Cardiovascular;  Laterality: N/A;  . ENDARTERECTOMY FEMORAL Left 02/17/2015   Procedure: LEFT FEMORAl ENDARTERECTOMY ;  Surgeon: Angelia Mould, MD;  Location: [place];  Service: Vascular;  Laterality: Left;  . HEMORRHOID SURGERY    . INCISION AND DRAINAGE OF  WOUND N/A 09/14/2015   Procedure: IRRIGATION AND DEBRIDEMENT SCROTAL WOUND WITH PLACEMENT OF ACELL;  Surgeon: Loel Lofty Dillingham, DO;  Location: [place];  Service: Plastics;  Laterality: N/A;  Including Groin  . INCISION AND DRAINAGE OF WOUND N/A 09/25/2015   Procedure: IRRIGATION AND DEBRIDEMENT OF SCROTUM  WOUND;  Surgeon: Loel Lofty Dillingham, DO;  Location: [place];  Service: Plastics;  Laterality: N/A;  . INCISION AND DRAINAGE OF WOUND N/A 10/05/2015   Procedure: IRRIGATION AND DEBRIDEMENT OF SCROTAL, ABDOMINAL  AND PERINEAL WOUND;  Surgeon: Loel Lofty Dillingham, DO;  Location: [place];  Service: Plastics;  Laterality: N/A;  . IRRIGATION AND DEBRIDEMENT ABSCESS N/A 08/31/2015   Procedure: IRRIGATION AND DEBRIDEMENT SCROTAL  ABSCESS;  Surgeon: Kathie Rhodes, MD;  Location: [facility];  Service: Urology;  Laterality: N/A;  . IRRIGATION AND DEBRIDEMENT ABSCESS N/A 08/31/2015   Procedure: IRRIGATION AND DEBRIDEMENT ABSCESS;  Surgeon: Rolm Bookbinder, MD;  Location: [facility];  Service: General;  Laterality: N/A;  . IRRIGATION AND DEBRIDEMENT ABSCESS N/A 09/02/2015   Procedure: IRRIGATION AND DEBRIDEMENT OF SCROTUM, ABDOMEN AND PERINEUM  WITH DRESSING CHANGE;  Surgeon: Nickie Retort, MD;  Location: [facility];  Service: Urology;  Laterality: N/A;  . IRRIGATION AND DEBRIDEMENT ABSCESS N/A 09/04/2015   Procedure: IRRIGATION AND DEBRIDEMENT and dressing change ofABSCESS;  Surgeon: Irine Seal, MD;  Location: [facility];  Service: Urology;  Laterality: N/A;  to penis and scrotum  . LAPAROSCOPY N/A 11/07/2018   Procedure: LAPAROSCOPY DIAGNOSTIC;  Surgeon: Ralene Ok, MD;  Location[facility];  Service: General;  Laterality: N/A;  . LAPAROTOMY N/A 11/07/2018   Procedure: EXPLORATORY LAPAROTOMY gram patch gastric ulcer;  Surgeon: Ralene Ok, MD;  Location[facility];  Service: General;  Laterality: N/A;  . left eye surgery    . LEFT HEART CATHETERIZATION WITH CORONARY ANGIOGRAM N/A 02/25/2012   Procedure: LEFT HEART  CATHETERIZATION WITH CORONARY ANGIOGRAM;  Surgeon: Clent Demark, MD;  [facility];  Service: Cardiovascular;  Laterality: N/A;  . PATCH ANGIOPLASTY Left 02/17/2015   Procedure: VEIN PATCH ANGIOPLASTY;  Surgeon: Angelia Mould, MD;  Location: [place];  Service: Vascular;  Laterality: Left;  . PERIPHERAL VASCULAR CATHETERIZATION N/A 02/10/2015   Procedure: Abdominal Aortogram;  Surgeon: Elam Dutch, MD;  Location: [place] [facility];  Service: Cardiovascular;  Laterality: N/A;  . PERIPHERAL VASCULAR CATHETERIZATION Bilateral 02/10/2015   Procedure: Lower Extremity Angiography;  Surgeon: Elam Dutch, MD;  Location: [place] [facility];  Service: Cardiovascular;  Laterality: Bilateral;  . ROBOT ASSISTED LAPAROSCOPIC RADICAL PROSTATECTOMY  05/03/2009  . WRIST SURGERY     left        Home Medications    Prior to Admission medications   Medication Sig Start Date End Date Taking? Authorizing Provider  latanoprost (XALATAN) 0.005 % ophthalmic solution Place 1 drop into both eyes at bedtime.   Yes [provider]  metoprolol  succinate (TOPROL-XL) 25 MG 24 hr tablet Take 1 tablet (25 mg total) by mouth daily. Patient taking differently: Take 25 mg by mouth 2 (two) times a day.  09/08/15  Yes Debbe Odea, MD  nitroGLYCERIN (NITROSTAT) 0.4 MG SL tablet Place 1 tablet (0.4 mg total) under the tongue every 5 (five) minutes x 3 doses as needed for chest pain. 02/27/12  Yes Charolette Forward, MD  aspirin EC 81 MG EC tablet Take 1 tablet (81 mg total) by mouth daily. Patient not taking: Reported on 11/07/2018 02/27/12   Charolette Forward, MD  atorvastatin (LIPITOR) 80 MG tablet Take 1 tablet (80 mg total) by mouth daily at 6 PM. Patient not taking: Reported on 11/07/2018 02/27/12   Charolette Forward, MD  folic acid (FOLVITE) 1 MG tablet Take 1 mg by mouth daily. 12/17/18   [provider]  furosemide (LASIX) 20 MG tablet Take 20 mg by mouth every other day. 12/25/18   [provider]  pantoprazole (PROTONIX) 40 MG tablet Take 1 tablet (40 mg total) by mouth 2 (two) times daily for 30 days. 11/16/18 12/16/18  [place] Eric J, DO    Family History Family History  Problem Relation Age of Onset  . Uterine cancer Mother 79       going [facility]  . Breast cancer Sister     Social History Social History   Tobacco Use  . Smoking status: Former Smoker    Packs/day: 2.00    Years: 36.00    Pack years: 72.00    Types: Cigarettes    Quit date: 12/08/2014    Years since quitting: 4.0  . Smokeless tobacco: Never Used  Substance Use Topics  . Alcohol use: Yes    Alcohol/week: 6.0 standard drinks    Types: 6 Cans of beer per week    Comment: 6-7  cans drinks daily 12 oz  . Drug use: No     Allergies   Other   Review of Systems Review of Systems  All other systems reviewed and are negative.    Physical Exam Updated Vital Signs BP (!) 132/95   Pulse (!) 117   Temp 98.5 F (36.9 C) (Rectal)   Resp (!) 23   SpO2 100%   Physical Exam Vitals signs and nursing note reviewed.  Constitutional:      Appearance: He is well-developed. He is ill-appearing.     Comments: Disheveled  HENT:     Head: Normocephalic and atraumatic.     Right Ear: External ear normal.     Left Ear: External ear normal.  Eyes:     General: No scleral icterus.       Right eye: No discharge.        Left eye: No discharge.     Conjunctiva/sclera: Conjunctivae normal.  Neck:     Musculoskeletal: Neck supple.     Trachea: No tracheal deviation.  Cardiovascular:     Rate and Rhythm: Normal rate and regular rhythm.  Pulmonary:     Effort: Pulmonary effort is normal. No respiratory distress.     Breath sounds: Normal breath sounds. No stridor. No wheezing or rales.  Abdominal:     General: Bowel sounds are normal. There is no distension.     Palpations: Abdomen is soft.     Tenderness: There is no abdominal tenderness. There is no guarding or rebound.  Genitourinary:     Comments: Incontinent of stool Musculoskeletal:        General:  No tenderness.  Skin:    General: Skin is warm and dry.     Findings: Rash present.     Comments: Numerous areas of superficial open wounds consistent with blister lesions throughout the entire body, various stages of healing and scabbing, no lymphangitic streaking, no evidence of abscess  Neurological:     Cranial Nerves: No cranial nerve deficit (no facial droop, extraocular movements intact, no slurred speech).     Sensory: No sensory deficit.     Motor: No abnormal muscle tone or seizure activity.     Coordination: Coordination normal.          ED Treatments / Results  Labs (all labs ordered are listed, but only abnormal results are displayed) Labs Reviewed  LACTIC ACID, PLASMA - Abnormal; Notable for the following components:      Result Value   Lactic Acid, Venous 2.8 (*)    All other components within normal limits  COMPREHENSIVE METABOLIC PANEL - Abnormal; Notable for the following components:   BUN 32 (*)    Creatinine, Ser 1.86 (*)    Calcium 7.9 (*)    Total Protein 5.1 (*)    Albumin 1.4 (*)    GFR calc non Af Amer 37 (*)    GFR calc Af Amer 43 (*)    Anion gap 16 (*)    All other components within normal limits  CBC WITH DIFFERENTIAL/PLATELET - Abnormal; Notable for the following components:   WBC 12.0 (*)    Hemoglobin 12.1 (*)    HCT 37.5 (*)    RDW 16.9 (*)    Platelets 533 (*)    Neutro Abs 10.1 (*)    Abs Immature Granulocytes 0.20 (*)    All other components within normal limits  PROTIME-INR - Abnormal; Notable for the following components:   Prothrombin Time 15.4 (*)    All other components within normal limits  SARS CORONAVIRUS 2 (HOSPITAL ORDER, [place] [facility])  CULTURE, BLOOD (ROUTINE X 2)  CULTURE, BLOOD (ROUTINE X 2)  URINE CULTURE  APTT  ETHANOL  LACTIC ACID, PLASMA  URINALYSIS, ROUTINE W REFLEX MICROSCOPIC  PATHOLOGIST SMEAR REVIEW    EKG  EKG Interpretation  Date/Time:  Friday January 01 2019 10:07:52 EDT Ventricular Rate:  114 PR Interval:    QRS Duration: 102 QT Interval:  342 QTC Calculation: 471 R Axis:   73 Text Interpretation:  Sinus tachycardia Low voltage, extremity leads Artifact in lead(s) I III aVR aVL aVF V1 V2 V3 V6 Confirmed by Dorie Rank [phone]) on 01/01/2019 10:17:16 AM   Radiology Dg Chest Port 1 View  Result Date: 01/01/2019 CLINICAL DATA:  Weakness. Skin blisters for 2 weeks. History of prostate cancer. EXAM: PORTABLE CHEST 1 VIEW COMPARISON:  Radiographs 11/07/2018 and 09/04/2015. FINDINGS: 0902 hours. The heart size and mediastinal contours are stable with mild aortic atherosclerosis. There is stable calcified pleural thickening at both lung apices. No edema, confluent airspace opacity, pleural effusion or pneumothorax. The bones appear unchanged. There is an old ununited fracture of the distal left clavicle. IMPRESSION: No active cardiopulmonary process. Electronically Signed   By: Richardean Sale M.D.   On: 01/01/2019 09:52    Procedures Procedures (including critical care time)  Medications Ordered in ED Medications  cefTRIAXone (ROCEPHIN) 2 g in sodium chloride 0.9 % 100 mL IVPB (0 g Intravenous Stopped 01/01/19 1159)  sodium chloride 0.9 % bolus 1,000 mL (0 mLs Intravenous Stopped 01/01/19 1159)    And  sodium  chloride 0.9 % bolus 1,000 mL (0 mLs Intravenous Stopped 01/01/19 1159)    And  sodium chloride 0.9 % bolus 250 mL (0 mLs Intravenous Stopped 01/01/19 1158)  thiamine (B-1) injection 100 mg (100 mg Intravenous Given 01/01/19 0945)     Initial Impression / Assessment and Plan / ED Course  I have reviewed the triage vital signs and the nursing notes.  Pertinent labs & imaging results that were available during my care of the patient were reviewed by me and considered in my medical decision making (see chart for details).  Clinical Course as of Dec 31 1213  Fri Jan 01, 2019  1213 Labs  notable for a leukocytosis and elevated lactic acid level.   [JK]  1213 Elevated BUN/creatinine consistent with an AKI.  Decreased protein levels consistent with malnutrition.   [JK]    Clinical Course User Index [JK] Dorie Rank, MD     Patient presented to the emergency room for evaluation of weakness.  Patient has a history of bullous pemphigoid.  He has multiple areas of skin breakdown associated with that illness.  Patient has had increasing weakness.  He is tachycardic and appears dehydrated.  I suspect this is contributing to his weakness.  I suspect he may have a component of cellulitis associated with his chronic skin condition.  IV antibiotics and fluids have been ordered.  Increased lactic acid level but I doubt severe sepsis at this time.  We will continue to monitor.  I will consult the medical service for admission and further treatment.  Final Clinical Impressions(s) / ED Diagnoses   Final diagnoses:  Cellulitis, unspecified cellulitis site  AKI (acute kidney injury) ([place])  Dehydration  Protein malnutrition ([facility])      Dorie Rank, MD 01/01/19 1216  Dr. Lorin Mercy evaluated the patient.  She discussed the case with dermatology at [facility].  They recommend admission to a facility with dermatology.  [facility] does not have any bed availability.  I spoke with [facility].  They are not accepting any outside transfers.  [facility] had bed availability.  Pt transferred in stable condition for further treatment.   Dorie Rank, MD 01/02/19 [phone]

## 2019-01-01 NOTE — ED Notes (Signed)
unable to get blood from pt

## 2019-01-01 NOTE — ED Notes (Signed)
Pt signed permit to transport to [facility] for admission

## 2019-01-01 NOTE — ED Notes (Signed)
[facility] transport team left with the npt at 2135

## 2019-01-01 NOTE — ED Notes (Signed)
pts daughter remains at   The bedside

## 2019-01-01 NOTE — ED Notes (Signed)
Waiting for [facility] transport

## 2019-01-01 NOTE — ED Triage Notes (Signed)
Per [facility], pt from home with head to toe blisters and skin breakdown with drainaged x 2 weeks. Also endorses fever. Afebrile.

## 2019-01-01 NOTE — ED Notes (Signed)
Report called to charge nurse  Jun rn at [place] tower

## 2019-01-03 MED ORDER — LACTATED RINGERS IV SOLN
INTRAVENOUS | Status: DC
Start: ? — End: 2019-01-03

## 2019-01-03 MED ORDER — GENERIC EXTERNAL MEDICATION
1.00 | Status: DC
Start: 2019-01-04 — End: 2019-01-03

## 2019-01-03 MED ORDER — METHYLPREDNISOLONE SODIUM SUCC 40 MG IJ SOLR
32.00 | INTRAMUSCULAR | Status: DC
Start: 2019-01-04 — End: 2019-01-03

## 2019-01-03 MED ORDER — SILVER SULFADIAZINE 1 % EX CREA
TOPICAL_CREAM | CUTANEOUS | Status: DC
Start: 2019-01-15 — End: 2019-01-03

## 2019-01-03 MED ORDER — GENERIC EXTERNAL MEDICATION
1.00 | Status: DC
Start: 2019-01-12 — End: 2019-01-03

## 2019-01-03 MED ORDER — PYRIDOXINE HCL 100 MG/ML IJ SOLN
50.00 | INTRAMUSCULAR | Status: DC
Start: 2019-01-12 — End: 2019-01-03

## 2019-01-03 MED ORDER — LORAZEPAM 1 MG PO TABS
2.00 | ORAL_TABLET | ORAL | Status: DC
Start: ? — End: 2019-01-03

## 2019-01-03 MED ORDER — GENERIC EXTERNAL MEDICATION
10.00 | Status: DC
Start: 2019-01-06 — End: 2019-01-03

## 2019-01-03 MED ORDER — HEPARIN SODIUM (PORCINE) 5000 UNIT/ML IJ SOLN
5000.00 | INTRAMUSCULAR | Status: DC
Start: 2019-01-12 — End: 2019-01-03

## 2019-01-03 MED ORDER — ATORVASTATIN CALCIUM 40 MG PO TABS
80.00 | ORAL_TABLET | ORAL | Status: DC
Start: 2019-01-12 — End: 2019-01-03

## 2019-01-03 MED ORDER — MAGNESIUM SULFATE 2 GM/50ML IV SOLN
2.00 | INTRAVENOUS | Status: DC
Start: 2019-01-03 — End: 2019-01-03

## 2019-01-03 MED ORDER — ASPIRIN EC 81 MG PO TBEC
81.00 | DELAYED_RELEASE_TABLET | ORAL | Status: DC
Start: 2019-01-08 — End: 2019-01-03

## 2019-01-03 MED ORDER — ONDANSETRON HCL 4 MG/2ML IJ SOLN
4.00 | INTRAMUSCULAR | Status: DC
Start: ? — End: 2019-01-03

## 2019-01-03 MED ORDER — PANTOPRAZOLE SODIUM 40 MG IV SOLR
40.00 | INTRAVENOUS | Status: DC
Start: 2019-01-11 — End: 2019-01-03

## 2019-01-03 MED ORDER — THIAMINE HCL 100 MG/ML IJ SOLN
100.00 | INTRAMUSCULAR | Status: DC
Start: 2019-01-12 — End: 2019-01-03

## 2019-01-06 LAB — CULTURE, BLOOD (ROUTINE X 2)
Culture: NO GROWTH
Culture: NO GROWTH

## 2019-01-06 MED ORDER — METHYLPREDNISOLONE SODIUM SUCC 40 MG IJ SOLR
16.00 | INTRAMUSCULAR | Status: DC
Start: 2019-01-08 — End: 2019-01-06

## 2019-01-07 MED ORDER — LACTATED RINGERS IV SOLN
INTRAVENOUS | Status: DC
Start: ? — End: 2019-01-07

## 2019-01-11 MED ORDER — GENERIC EXTERNAL MEDICATION
20.00 | Status: DC
Start: ? — End: 2019-01-11

## 2019-01-11 MED ORDER — ASPIRIN 81 MG PO CHEW
81.00 | CHEWABLE_TABLET | ORAL | Status: DC
Start: 2019-01-13 — End: 2019-01-11

## 2019-01-11 MED ORDER — METHYLPREDNISOLONE SODIUM SUCC 40 MG IJ SOLR
12.00 | INTRAMUSCULAR | Status: DC
Start: 2019-01-12 — End: 2019-01-11

## 2019-01-11 MED ORDER — GENERIC EXTERNAL MEDICATION
10.00 | Status: DC
Start: 2019-01-15 — End: 2019-01-11

## 2019-01-12 MED ORDER — MIRTAZAPINE 15 MG PO TABS
15.00 | ORAL_TABLET | ORAL | Status: DC
Start: 2019-01-15 — End: 2019-01-12

## 2019-01-12 MED ORDER — GENERIC EXTERNAL MEDICATION
50.00 | Status: DC
Start: 2019-01-13 — End: 2019-01-12

## 2019-01-12 MED ORDER — GENERIC EXTERNAL MEDICATION
15.00 | Status: DC
Start: 2019-01-16 — End: 2019-01-12

## 2019-01-12 MED ORDER — GENERIC EXTERNAL MEDICATION
40.00 | Status: DC
Start: 2019-01-16 — End: 2019-01-12

## 2019-01-12 MED ORDER — FOLIC ACID 1 MG PO TABS
1.00 | ORAL_TABLET | ORAL | Status: DC
Start: 2019-01-13 — End: 2019-01-12

## 2019-01-12 MED ORDER — GENERIC EXTERNAL MEDICATION
100.00 | Status: DC
Start: 2019-01-13 — End: 2019-01-12

## 2019-01-16 MED ORDER — POLYETHYLENE GLYCOL 3350 17 G PO PACK
17.00 | PACK | ORAL | Status: DC
Start: ? — End: 2019-01-16

## 2019-01-16 MED ORDER — CARBOXYMETHYLCELLULOSE SODIUM 0.5 % OP SOLN
1.00 | OPHTHALMIC | Status: DC
Start: ? — End: 2019-01-16

## 2019-01-16 MED ORDER — BISACODYL 10 MG RE SUPP
10.00 | RECTAL | Status: DC
Start: ? — End: 2019-01-16

## 2019-02-02 DEATH — deceased

## 2019-03-24 LAB — BLOOD GAS, VENOUS
Acid-base deficit: 1.3 mmol/L (ref 0.0–2.0)
Bicarbonate: 22.5 mmol/L (ref 20.0–28.0)
O2 Saturation: 11.6 %
Patient temperature: 98.6
pCO2, Ven: 36.7 mmHg — ABNORMAL LOW (ref 44.0–60.0)
pH, Ven: 7.405 (ref 7.250–7.430)
